# Patient Record
Sex: Male | Born: 1945
Health system: Southern US, Community
[De-identification: ages and names within clinical notes are randomized; demographics above are authoritative.]

## PROBLEM LIST (undated history)

## (undated) DIAGNOSIS — E6609 Other obesity due to excess calories: Secondary | ICD-10-CM

## (undated) DIAGNOSIS — I2510 Atherosclerotic heart disease of native coronary artery without angina pectoris: Secondary | ICD-10-CM

## (undated) DIAGNOSIS — I4891 Unspecified atrial fibrillation: Secondary | ICD-10-CM

## (undated) DIAGNOSIS — E66811 Obesity, class 1: Secondary | ICD-10-CM

## (undated) DIAGNOSIS — I10 Essential (primary) hypertension: Secondary | ICD-10-CM

## (undated) DIAGNOSIS — I639 Cerebral infarction, unspecified: Secondary | ICD-10-CM

## (undated) DIAGNOSIS — I219 Acute myocardial infarction, unspecified: Secondary | ICD-10-CM

## (undated) DIAGNOSIS — E785 Hyperlipidemia, unspecified: Secondary | ICD-10-CM

## (undated) DIAGNOSIS — R7301 Impaired fasting glucose: Secondary | ICD-10-CM

## (undated) DIAGNOSIS — I480 Paroxysmal atrial fibrillation: Secondary | ICD-10-CM

## (undated) DIAGNOSIS — C801 Malignant (primary) neoplasm, unspecified: Secondary | ICD-10-CM

## (undated) DIAGNOSIS — E559 Vitamin D deficiency, unspecified: Secondary | ICD-10-CM

## (undated) DIAGNOSIS — F5101 Primary insomnia: Secondary | ICD-10-CM

## (undated) DIAGNOSIS — I110 Hypertensive heart disease with heart failure: Secondary | ICD-10-CM

## (undated) DIAGNOSIS — I6529 Occlusion and stenosis of unspecified carotid artery: Secondary | ICD-10-CM

## (undated) DIAGNOSIS — Z6836 Body mass index (BMI) 36.0-36.9, adult: Secondary | ICD-10-CM

## (undated) DIAGNOSIS — E039 Hypothyroidism, unspecified: Secondary | ICD-10-CM

## (undated) DIAGNOSIS — J449 Chronic obstructive pulmonary disease, unspecified: Secondary | ICD-10-CM

## (undated) DIAGNOSIS — I5032 Chronic diastolic (congestive) heart failure: Secondary | ICD-10-CM

## (undated) HISTORY — DX: Chronic obstructive pulmonary disease, unspecified: J44.9

## (undated) HISTORY — DX: Essential (primary) hypertension: I10

## (undated) HISTORY — DX: Primary insomnia: F51.01

## (undated) HISTORY — DX: Vitamin D deficiency, unspecified: E55.9

## (undated) HISTORY — DX: Impaired fasting glucose: R73.01

## (undated) HISTORY — DX: Morbid (severe) obesity due to excess calories: Z68.36

## (undated) HISTORY — DX: Atherosclerotic heart disease of native coronary artery without angina pectoris: I25.10

## (undated) HISTORY — PX: ROTATOR CUFF REPAIR: SHX139

## (undated) HISTORY — DX: Occlusion and stenosis of unspecified carotid artery: I65.29

## (undated) HISTORY — PX: HERNIA REPAIR: SHX51

## (undated) HISTORY — DX: Hypertensive heart disease with heart failure: I11.0

## (undated) HISTORY — DX: Morbid (severe) obesity due to excess calories: E66.01

## (undated) HISTORY — PX: CARDIAC CATHETERIZATION: SHX172

## (undated) HISTORY — DX: Paroxysmal atrial fibrillation: I48.0

## (undated) HISTORY — DX: Acute myocardial infarction, unspecified: I21.9

## (undated) HISTORY — DX: Cerebral infarction, unspecified: I63.9

## (undated) HISTORY — DX: Chronic diastolic (congestive) heart failure: I50.32

## (undated) HISTORY — DX: Hyperlipidemia, unspecified: E78.5

---
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
## 1973-08-29 HISTORY — PX: TONSILLECTOMY: SUR1361

## 1999-08-30 HISTORY — PX: CORONARY ARTERY BYPASS GRAFT: SHX141

## 2000-08-29 HISTORY — PX: CARDIOVERSION: SHX1299

## 2004-08-29 HISTORY — PX: CHOLECYSTECTOMY: SHX55

## 2013-04-19 DIAGNOSIS — J069 Acute upper respiratory infection, unspecified: Secondary | ICD-10-CM | POA: Diagnosis not present

## 2013-04-19 DIAGNOSIS — J209 Acute bronchitis, unspecified: Secondary | ICD-10-CM | POA: Diagnosis not present

## 2013-04-23 DIAGNOSIS — R05 Cough: Secondary | ICD-10-CM | POA: Diagnosis not present

## 2013-04-23 DIAGNOSIS — R059 Cough, unspecified: Secondary | ICD-10-CM | POA: Diagnosis not present

## 2013-04-23 DIAGNOSIS — J209 Acute bronchitis, unspecified: Secondary | ICD-10-CM | POA: Diagnosis not present

## 2013-05-06 DIAGNOSIS — I2510 Atherosclerotic heart disease of native coronary artery without angina pectoris: Secondary | ICD-10-CM | POA: Diagnosis not present

## 2013-05-06 DIAGNOSIS — I10 Essential (primary) hypertension: Secondary | ICD-10-CM | POA: Diagnosis not present

## 2013-05-09 DIAGNOSIS — R233 Spontaneous ecchymoses: Secondary | ICD-10-CM | POA: Diagnosis not present

## 2013-05-09 DIAGNOSIS — D485 Neoplasm of uncertain behavior of skin: Secondary | ICD-10-CM | POA: Diagnosis not present

## 2013-05-09 DIAGNOSIS — I2510 Atherosclerotic heart disease of native coronary artery without angina pectoris: Secondary | ICD-10-CM | POA: Diagnosis not present

## 2013-05-09 DIAGNOSIS — I10 Essential (primary) hypertension: Secondary | ICD-10-CM | POA: Diagnosis not present

## 2013-05-09 DIAGNOSIS — J410 Simple chronic bronchitis: Secondary | ICD-10-CM | POA: Diagnosis not present

## 2013-05-09 DIAGNOSIS — C4492 Squamous cell carcinoma of skin, unspecified: Secondary | ICD-10-CM | POA: Diagnosis not present

## 2013-05-09 DIAGNOSIS — E7800 Pure hypercholesterolemia, unspecified: Secondary | ICD-10-CM | POA: Diagnosis not present

## 2013-05-23 DIAGNOSIS — D2370 Other benign neoplasm of skin of unspecified lower limb, including hip: Secondary | ICD-10-CM | POA: Diagnosis not present

## 2013-05-23 DIAGNOSIS — C44791 Other specified malignant neoplasm of skin of unspecified lower limb, including hip: Secondary | ICD-10-CM | POA: Diagnosis not present

## 2013-05-23 DIAGNOSIS — D485 Neoplasm of uncertain behavior of skin: Secondary | ICD-10-CM | POA: Diagnosis not present

## 2013-06-06 DIAGNOSIS — D0460 Carcinoma in situ of skin of unspecified upper limb, including shoulder: Secondary | ICD-10-CM | POA: Diagnosis not present

## 2013-06-06 DIAGNOSIS — D485 Neoplasm of uncertain behavior of skin: Secondary | ICD-10-CM | POA: Diagnosis not present

## 2013-06-06 DIAGNOSIS — D2370 Other benign neoplasm of skin of unspecified lower limb, including hip: Secondary | ICD-10-CM | POA: Diagnosis not present

## 2013-06-06 DIAGNOSIS — C44621 Squamous cell carcinoma of skin of unspecified upper limb, including shoulder: Secondary | ICD-10-CM | POA: Diagnosis not present

## 2013-06-08 DIAGNOSIS — Z79899 Other long term (current) drug therapy: Secondary | ICD-10-CM | POA: Diagnosis not present

## 2013-06-08 DIAGNOSIS — E7800 Pure hypercholesterolemia, unspecified: Secondary | ICD-10-CM | POA: Diagnosis not present

## 2013-06-08 DIAGNOSIS — R5381 Other malaise: Secondary | ICD-10-CM | POA: Diagnosis not present

## 2013-06-08 DIAGNOSIS — J189 Pneumonia, unspecified organism: Secondary | ICD-10-CM | POA: Diagnosis not present

## 2013-06-08 DIAGNOSIS — I4891 Unspecified atrial fibrillation: Secondary | ICD-10-CM | POA: Diagnosis not present

## 2013-06-08 DIAGNOSIS — R0989 Other specified symptoms and signs involving the circulatory and respiratory systems: Secondary | ICD-10-CM | POA: Diagnosis not present

## 2013-06-08 DIAGNOSIS — I10 Essential (primary) hypertension: Secondary | ICD-10-CM | POA: Diagnosis not present

## 2013-06-09 DIAGNOSIS — R0989 Other specified symptoms and signs involving the circulatory and respiratory systems: Secondary | ICD-10-CM | POA: Diagnosis not present

## 2013-06-12 DIAGNOSIS — J189 Pneumonia, unspecified organism: Secondary | ICD-10-CM | POA: Diagnosis not present

## 2013-06-12 DIAGNOSIS — I4891 Unspecified atrial fibrillation: Secondary | ICD-10-CM | POA: Diagnosis not present

## 2013-06-12 DIAGNOSIS — R0602 Shortness of breath: Secondary | ICD-10-CM | POA: Diagnosis not present

## 2013-06-12 DIAGNOSIS — I4892 Unspecified atrial flutter: Secondary | ICD-10-CM | POA: Diagnosis not present

## 2013-06-12 DIAGNOSIS — R0609 Other forms of dyspnea: Secondary | ICD-10-CM | POA: Diagnosis not present

## 2013-06-12 DIAGNOSIS — R05 Cough: Secondary | ICD-10-CM | POA: Diagnosis not present

## 2013-06-12 DIAGNOSIS — J9801 Acute bronchospasm: Secondary | ICD-10-CM | POA: Diagnosis not present

## 2013-06-13 DIAGNOSIS — J209 Acute bronchitis, unspecified: Secondary | ICD-10-CM | POA: Diagnosis not present

## 2013-06-13 DIAGNOSIS — C44621 Squamous cell carcinoma of skin of unspecified upper limb, including shoulder: Secondary | ICD-10-CM | POA: Diagnosis not present

## 2013-06-13 DIAGNOSIS — I4892 Unspecified atrial flutter: Secondary | ICD-10-CM | POA: Diagnosis not present

## 2013-06-18 DIAGNOSIS — I10 Essential (primary) hypertension: Secondary | ICD-10-CM | POA: Diagnosis not present

## 2013-06-18 DIAGNOSIS — E785 Hyperlipidemia, unspecified: Secondary | ICD-10-CM | POA: Diagnosis not present

## 2013-06-18 DIAGNOSIS — Z8249 Family history of ischemic heart disease and other diseases of the circulatory system: Secondary | ICD-10-CM | POA: Diagnosis not present

## 2013-06-18 DIAGNOSIS — Z803 Family history of malignant neoplasm of breast: Secondary | ICD-10-CM | POA: Diagnosis not present

## 2013-06-18 DIAGNOSIS — Z87891 Personal history of nicotine dependence: Secondary | ICD-10-CM | POA: Diagnosis not present

## 2013-06-18 DIAGNOSIS — I4891 Unspecified atrial fibrillation: Secondary | ICD-10-CM | POA: Diagnosis not present

## 2013-06-24 DIAGNOSIS — I4891 Unspecified atrial fibrillation: Secondary | ICD-10-CM | POA: Diagnosis not present

## 2013-07-02 DIAGNOSIS — I2510 Atherosclerotic heart disease of native coronary artery without angina pectoris: Secondary | ICD-10-CM | POA: Diagnosis not present

## 2013-07-02 DIAGNOSIS — I4891 Unspecified atrial fibrillation: Secondary | ICD-10-CM | POA: Diagnosis not present

## 2013-07-09 DIAGNOSIS — I10 Essential (primary) hypertension: Secondary | ICD-10-CM | POA: Diagnosis not present

## 2013-07-09 DIAGNOSIS — I4891 Unspecified atrial fibrillation: Secondary | ICD-10-CM | POA: Diagnosis not present

## 2013-07-09 DIAGNOSIS — R0609 Other forms of dyspnea: Secondary | ICD-10-CM | POA: Diagnosis not present

## 2013-07-09 DIAGNOSIS — E785 Hyperlipidemia, unspecified: Secondary | ICD-10-CM | POA: Diagnosis not present

## 2013-07-16 DIAGNOSIS — J0100 Acute maxillary sinusitis, unspecified: Secondary | ICD-10-CM | POA: Diagnosis not present

## 2013-07-18 DIAGNOSIS — J209 Acute bronchitis, unspecified: Secondary | ICD-10-CM | POA: Diagnosis not present

## 2013-07-18 DIAGNOSIS — J0100 Acute maxillary sinusitis, unspecified: Secondary | ICD-10-CM | POA: Diagnosis not present

## 2013-08-07 DIAGNOSIS — I10 Essential (primary) hypertension: Secondary | ICD-10-CM | POA: Diagnosis not present

## 2013-08-07 DIAGNOSIS — R0609 Other forms of dyspnea: Secondary | ICD-10-CM | POA: Diagnosis not present

## 2013-08-07 DIAGNOSIS — I4891 Unspecified atrial fibrillation: Secondary | ICD-10-CM | POA: Diagnosis not present

## 2013-08-07 DIAGNOSIS — E785 Hyperlipidemia, unspecified: Secondary | ICD-10-CM | POA: Diagnosis not present

## 2013-08-12 DIAGNOSIS — J0100 Acute maxillary sinusitis, unspecified: Secondary | ICD-10-CM | POA: Diagnosis not present

## 2013-09-08 DIAGNOSIS — G4733 Obstructive sleep apnea (adult) (pediatric): Secondary | ICD-10-CM | POA: Diagnosis not present

## 2013-09-08 DIAGNOSIS — R0989 Other specified symptoms and signs involving the circulatory and respiratory systems: Secondary | ICD-10-CM | POA: Diagnosis not present

## 2013-09-08 DIAGNOSIS — I4891 Unspecified atrial fibrillation: Secondary | ICD-10-CM | POA: Diagnosis not present

## 2013-09-25 DIAGNOSIS — I4891 Unspecified atrial fibrillation: Secondary | ICD-10-CM | POA: Diagnosis not present

## 2013-09-25 DIAGNOSIS — I2510 Atherosclerotic heart disease of native coronary artery without angina pectoris: Secondary | ICD-10-CM | POA: Diagnosis not present

## 2013-09-25 DIAGNOSIS — I10 Essential (primary) hypertension: Secondary | ICD-10-CM | POA: Diagnosis not present

## 2013-09-25 DIAGNOSIS — E7800 Pure hypercholesterolemia, unspecified: Secondary | ICD-10-CM | POA: Diagnosis not present

## 2013-10-02 DIAGNOSIS — R0989 Other specified symptoms and signs involving the circulatory and respiratory systems: Secondary | ICD-10-CM | POA: Diagnosis not present

## 2013-10-02 DIAGNOSIS — E785 Hyperlipidemia, unspecified: Secondary | ICD-10-CM | POA: Diagnosis not present

## 2013-10-02 DIAGNOSIS — I10 Essential (primary) hypertension: Secondary | ICD-10-CM | POA: Diagnosis not present

## 2013-10-02 DIAGNOSIS — R0609 Other forms of dyspnea: Secondary | ICD-10-CM | POA: Diagnosis not present

## 2013-10-02 DIAGNOSIS — I4891 Unspecified atrial fibrillation: Secondary | ICD-10-CM | POA: Diagnosis not present

## 2013-10-04 DIAGNOSIS — J0100 Acute maxillary sinusitis, unspecified: Secondary | ICD-10-CM | POA: Diagnosis not present

## 2013-10-18 DIAGNOSIS — I10 Essential (primary) hypertension: Secondary | ICD-10-CM | POA: Diagnosis not present

## 2013-10-18 DIAGNOSIS — K089 Disorder of teeth and supporting structures, unspecified: Secondary | ICD-10-CM | POA: Diagnosis not present

## 2013-10-19 DIAGNOSIS — L570 Actinic keratosis: Secondary | ICD-10-CM | POA: Diagnosis not present

## 2013-11-08 DIAGNOSIS — J0100 Acute maxillary sinusitis, unspecified: Secondary | ICD-10-CM | POA: Diagnosis not present

## 2013-12-30 DIAGNOSIS — I2510 Atherosclerotic heart disease of native coronary artery without angina pectoris: Secondary | ICD-10-CM | POA: Diagnosis not present

## 2013-12-30 DIAGNOSIS — I4891 Unspecified atrial fibrillation: Secondary | ICD-10-CM | POA: Diagnosis not present

## 2013-12-30 DIAGNOSIS — E663 Overweight: Secondary | ICD-10-CM | POA: Diagnosis not present

## 2013-12-30 DIAGNOSIS — I10 Essential (primary) hypertension: Secondary | ICD-10-CM | POA: Diagnosis not present

## 2013-12-30 DIAGNOSIS — E7800 Pure hypercholesterolemia, unspecified: Secondary | ICD-10-CM | POA: Diagnosis not present

## 2013-12-30 DIAGNOSIS — Z6841 Body Mass Index (BMI) 40.0 and over, adult: Secondary | ICD-10-CM | POA: Diagnosis not present

## 2014-01-08 DIAGNOSIS — L570 Actinic keratosis: Secondary | ICD-10-CM | POA: Diagnosis not present

## 2014-01-12 DIAGNOSIS — E7800 Pure hypercholesterolemia, unspecified: Secondary | ICD-10-CM | POA: Diagnosis not present

## 2014-01-12 DIAGNOSIS — Z79899 Other long term (current) drug therapy: Secondary | ICD-10-CM | POA: Diagnosis not present

## 2014-01-12 DIAGNOSIS — R05 Cough: Secondary | ICD-10-CM | POA: Diagnosis not present

## 2014-01-12 DIAGNOSIS — J988 Other specified respiratory disorders: Secondary | ICD-10-CM | POA: Diagnosis not present

## 2014-01-12 DIAGNOSIS — I4891 Unspecified atrial fibrillation: Secondary | ICD-10-CM | POA: Diagnosis not present

## 2014-01-12 DIAGNOSIS — J4489 Other specified chronic obstructive pulmonary disease: Secondary | ICD-10-CM | POA: Diagnosis not present

## 2014-01-12 DIAGNOSIS — R0602 Shortness of breath: Secondary | ICD-10-CM | POA: Diagnosis not present

## 2014-01-12 DIAGNOSIS — J449 Chronic obstructive pulmonary disease, unspecified: Secondary | ICD-10-CM | POA: Diagnosis not present

## 2014-01-12 DIAGNOSIS — I10 Essential (primary) hypertension: Secondary | ICD-10-CM | POA: Diagnosis not present

## 2014-01-12 DIAGNOSIS — R509 Fever, unspecified: Secondary | ICD-10-CM | POA: Diagnosis not present

## 2014-01-12 DIAGNOSIS — R059 Cough, unspecified: Secondary | ICD-10-CM | POA: Diagnosis not present

## 2014-01-24 DIAGNOSIS — J209 Acute bronchitis, unspecified: Secondary | ICD-10-CM | POA: Diagnosis not present

## 2014-03-10 DIAGNOSIS — E785 Hyperlipidemia, unspecified: Secondary | ICD-10-CM | POA: Diagnosis not present

## 2014-03-10 DIAGNOSIS — I10 Essential (primary) hypertension: Secondary | ICD-10-CM | POA: Diagnosis not present

## 2014-03-10 DIAGNOSIS — R0609 Other forms of dyspnea: Secondary | ICD-10-CM | POA: Diagnosis not present

## 2014-03-10 DIAGNOSIS — I4891 Unspecified atrial fibrillation: Secondary | ICD-10-CM | POA: Diagnosis not present

## 2014-03-17 DIAGNOSIS — E785 Hyperlipidemia, unspecified: Secondary | ICD-10-CM | POA: Diagnosis not present

## 2014-03-17 DIAGNOSIS — R0609 Other forms of dyspnea: Secondary | ICD-10-CM | POA: Diagnosis not present

## 2014-03-21 DIAGNOSIS — J0100 Acute maxillary sinusitis, unspecified: Secondary | ICD-10-CM | POA: Diagnosis not present

## 2014-04-30 DIAGNOSIS — S61409A Unspecified open wound of unspecified hand, initial encounter: Secondary | ICD-10-CM | POA: Diagnosis not present

## 2014-04-30 DIAGNOSIS — Z6841 Body Mass Index (BMI) 40.0 and over, adult: Secondary | ICD-10-CM | POA: Diagnosis not present

## 2014-04-30 DIAGNOSIS — Z79899 Other long term (current) drug therapy: Secondary | ICD-10-CM | POA: Diagnosis not present

## 2014-04-30 DIAGNOSIS — E7800 Pure hypercholesterolemia, unspecified: Secondary | ICD-10-CM | POA: Diagnosis not present

## 2014-04-30 DIAGNOSIS — E663 Overweight: Secondary | ICD-10-CM | POA: Diagnosis not present

## 2014-04-30 DIAGNOSIS — I10 Essential (primary) hypertension: Secondary | ICD-10-CM | POA: Diagnosis not present

## 2014-04-30 DIAGNOSIS — Z23 Encounter for immunization: Secondary | ICD-10-CM | POA: Diagnosis not present

## 2014-04-30 DIAGNOSIS — I2510 Atherosclerotic heart disease of native coronary artery without angina pectoris: Secondary | ICD-10-CM | POA: Diagnosis not present

## 2014-06-11 DIAGNOSIS — H6983 Other specified disorders of Eustachian tube, bilateral: Secondary | ICD-10-CM | POA: Diagnosis not present

## 2014-06-23 DIAGNOSIS — J209 Acute bronchitis, unspecified: Secondary | ICD-10-CM | POA: Diagnosis not present

## 2014-06-23 DIAGNOSIS — R05 Cough: Secondary | ICD-10-CM | POA: Diagnosis not present

## 2014-06-23 DIAGNOSIS — J158 Pneumonia due to other specified bacteria: Secondary | ICD-10-CM | POA: Diagnosis not present

## 2014-07-16 DIAGNOSIS — R0600 Dyspnea, unspecified: Secondary | ICD-10-CM | POA: Diagnosis not present

## 2014-07-16 DIAGNOSIS — J441 Chronic obstructive pulmonary disease with (acute) exacerbation: Secondary | ICD-10-CM | POA: Diagnosis not present

## 2014-07-18 DIAGNOSIS — H1033 Unspecified acute conjunctivitis, bilateral: Secondary | ICD-10-CM | POA: Diagnosis not present

## 2014-07-28 DIAGNOSIS — Z23 Encounter for immunization: Secondary | ICD-10-CM | POA: Diagnosis not present

## 2014-08-06 DIAGNOSIS — E780 Pure hypercholesterolemia: Secondary | ICD-10-CM | POA: Diagnosis not present

## 2014-08-06 DIAGNOSIS — I119 Hypertensive heart disease without heart failure: Secondary | ICD-10-CM | POA: Diagnosis not present

## 2014-08-06 DIAGNOSIS — I2510 Atherosclerotic heart disease of native coronary artery without angina pectoris: Secondary | ICD-10-CM | POA: Diagnosis not present

## 2014-08-06 DIAGNOSIS — I480 Paroxysmal atrial fibrillation: Secondary | ICD-10-CM | POA: Diagnosis not present

## 2014-08-06 DIAGNOSIS — I10 Essential (primary) hypertension: Secondary | ICD-10-CM | POA: Diagnosis not present

## 2014-08-28 DIAGNOSIS — J209 Acute bronchitis, unspecified: Secondary | ICD-10-CM | POA: Diagnosis not present

## 2014-09-24 DIAGNOSIS — J019 Acute sinusitis, unspecified: Secondary | ICD-10-CM | POA: Diagnosis not present

## 2014-09-25 DIAGNOSIS — I480 Paroxysmal atrial fibrillation: Secondary | ICD-10-CM | POA: Diagnosis not present

## 2014-09-25 DIAGNOSIS — E785 Hyperlipidemia, unspecified: Secondary | ICD-10-CM | POA: Diagnosis not present

## 2014-09-25 DIAGNOSIS — R0683 Snoring: Secondary | ICD-10-CM | POA: Diagnosis not present

## 2014-09-25 DIAGNOSIS — Z951 Presence of aortocoronary bypass graft: Secondary | ICD-10-CM | POA: Diagnosis not present

## 2014-09-25 DIAGNOSIS — I10 Essential (primary) hypertension: Secondary | ICD-10-CM | POA: Diagnosis not present

## 2014-10-06 DIAGNOSIS — I4891 Unspecified atrial fibrillation: Secondary | ICD-10-CM | POA: Diagnosis not present

## 2014-10-06 DIAGNOSIS — I369 Nonrheumatic tricuspid valve disorder, unspecified: Secondary | ICD-10-CM | POA: Diagnosis not present

## 2014-10-12 DIAGNOSIS — J0100 Acute maxillary sinusitis, unspecified: Secondary | ICD-10-CM | POA: Diagnosis not present

## 2014-10-17 DIAGNOSIS — M545 Low back pain: Secondary | ICD-10-CM | POA: Diagnosis not present

## 2014-10-24 DIAGNOSIS — M545 Low back pain: Secondary | ICD-10-CM | POA: Diagnosis not present

## 2014-10-24 DIAGNOSIS — J069 Acute upper respiratory infection, unspecified: Secondary | ICD-10-CM | POA: Diagnosis not present

## 2014-11-07 DIAGNOSIS — Z87891 Personal history of nicotine dependence: Secondary | ICD-10-CM | POA: Diagnosis not present

## 2014-11-07 DIAGNOSIS — I498 Other specified cardiac arrhythmias: Secondary | ICD-10-CM | POA: Diagnosis not present

## 2014-11-07 DIAGNOSIS — I4892 Unspecified atrial flutter: Secondary | ICD-10-CM | POA: Diagnosis not present

## 2014-11-07 DIAGNOSIS — E669 Obesity, unspecified: Secondary | ICD-10-CM | POA: Diagnosis not present

## 2014-11-07 DIAGNOSIS — I491 Atrial premature depolarization: Secondary | ICD-10-CM | POA: Diagnosis not present

## 2014-11-07 DIAGNOSIS — I4891 Unspecified atrial fibrillation: Secondary | ICD-10-CM | POA: Diagnosis not present

## 2014-11-07 DIAGNOSIS — Z951 Presence of aortocoronary bypass graft: Secondary | ICD-10-CM | POA: Diagnosis not present

## 2014-11-07 DIAGNOSIS — I119 Hypertensive heart disease without heart failure: Secondary | ICD-10-CM | POA: Diagnosis not present

## 2014-11-07 DIAGNOSIS — R079 Chest pain, unspecified: Secondary | ICD-10-CM | POA: Diagnosis not present

## 2014-11-07 DIAGNOSIS — E780 Pure hypercholesterolemia: Secondary | ICD-10-CM | POA: Diagnosis not present

## 2014-11-07 DIAGNOSIS — I25810 Atherosclerosis of coronary artery bypass graft(s) without angina pectoris: Secondary | ICD-10-CM | POA: Diagnosis present

## 2014-11-07 DIAGNOSIS — I480 Paroxysmal atrial fibrillation: Secondary | ICD-10-CM | POA: Diagnosis not present

## 2014-11-07 DIAGNOSIS — I483 Typical atrial flutter: Secondary | ICD-10-CM | POA: Diagnosis not present

## 2014-11-07 DIAGNOSIS — I10 Essential (primary) hypertension: Secondary | ICD-10-CM | POA: Diagnosis present

## 2014-11-07 DIAGNOSIS — R0683 Snoring: Secondary | ICD-10-CM | POA: Diagnosis present

## 2014-11-07 DIAGNOSIS — Z79899 Other long term (current) drug therapy: Secondary | ICD-10-CM | POA: Diagnosis not present

## 2014-11-07 DIAGNOSIS — I4430 Unspecified atrioventricular block: Secondary | ICD-10-CM | POA: Diagnosis not present

## 2014-11-07 DIAGNOSIS — I2510 Atherosclerotic heart disease of native coronary artery without angina pectoris: Secondary | ICD-10-CM | POA: Diagnosis not present

## 2014-11-07 DIAGNOSIS — E785 Hyperlipidemia, unspecified: Secondary | ICD-10-CM | POA: Diagnosis present

## 2014-11-07 DIAGNOSIS — I5031 Acute diastolic (congestive) heart failure: Secondary | ICD-10-CM | POA: Diagnosis present

## 2014-11-07 DIAGNOSIS — I252 Old myocardial infarction: Secondary | ICD-10-CM | POA: Diagnosis not present

## 2014-11-07 DIAGNOSIS — J410 Simple chronic bronchitis: Secondary | ICD-10-CM | POA: Diagnosis not present

## 2014-12-01 DIAGNOSIS — I481 Persistent atrial fibrillation: Secondary | ICD-10-CM | POA: Diagnosis not present

## 2014-12-01 DIAGNOSIS — J449 Chronic obstructive pulmonary disease, unspecified: Secondary | ICD-10-CM | POA: Diagnosis not present

## 2014-12-01 DIAGNOSIS — I10 Essential (primary) hypertension: Secondary | ICD-10-CM | POA: Diagnosis not present

## 2014-12-01 DIAGNOSIS — I119 Hypertensive heart disease without heart failure: Secondary | ICD-10-CM | POA: Diagnosis not present

## 2014-12-04 DIAGNOSIS — I4891 Unspecified atrial fibrillation: Secondary | ICD-10-CM | POA: Diagnosis not present

## 2014-12-04 DIAGNOSIS — R0789 Other chest pain: Secondary | ICD-10-CM | POA: Diagnosis not present

## 2014-12-04 DIAGNOSIS — J9811 Atelectasis: Secondary | ICD-10-CM | POA: Diagnosis not present

## 2014-12-04 DIAGNOSIS — I480 Paroxysmal atrial fibrillation: Secondary | ICD-10-CM | POA: Diagnosis not present

## 2014-12-05 DIAGNOSIS — I483 Typical atrial flutter: Secondary | ICD-10-CM | POA: Diagnosis not present

## 2014-12-05 DIAGNOSIS — E669 Obesity, unspecified: Secondary | ICD-10-CM | POA: Diagnosis present

## 2014-12-05 DIAGNOSIS — Z951 Presence of aortocoronary bypass graft: Secondary | ICD-10-CM | POA: Diagnosis not present

## 2014-12-05 DIAGNOSIS — I4892 Unspecified atrial flutter: Secondary | ICD-10-CM | POA: Diagnosis present

## 2014-12-05 DIAGNOSIS — Z888 Allergy status to other drugs, medicaments and biological substances status: Secondary | ICD-10-CM | POA: Diagnosis not present

## 2014-12-05 DIAGNOSIS — I10 Essential (primary) hypertension: Secondary | ICD-10-CM | POA: Diagnosis not present

## 2014-12-05 DIAGNOSIS — Z87891 Personal history of nicotine dependence: Secondary | ICD-10-CM | POA: Diagnosis not present

## 2014-12-05 DIAGNOSIS — R7989 Other specified abnormal findings of blood chemistry: Secondary | ICD-10-CM | POA: Diagnosis not present

## 2014-12-05 DIAGNOSIS — Z6841 Body Mass Index (BMI) 40.0 and over, adult: Secondary | ICD-10-CM | POA: Diagnosis not present

## 2014-12-05 DIAGNOSIS — R079 Chest pain, unspecified: Secondary | ICD-10-CM | POA: Diagnosis not present

## 2014-12-05 DIAGNOSIS — I4891 Unspecified atrial fibrillation: Secondary | ICD-10-CM | POA: Diagnosis present

## 2014-12-05 DIAGNOSIS — E785 Hyperlipidemia, unspecified: Secondary | ICD-10-CM | POA: Diagnosis not present

## 2014-12-05 DIAGNOSIS — I480 Paroxysmal atrial fibrillation: Secondary | ICD-10-CM | POA: Diagnosis not present

## 2014-12-05 DIAGNOSIS — I249 Acute ischemic heart disease, unspecified: Secondary | ICD-10-CM | POA: Diagnosis not present

## 2014-12-05 DIAGNOSIS — Z79899 Other long term (current) drug therapy: Secondary | ICD-10-CM | POA: Diagnosis not present

## 2014-12-05 DIAGNOSIS — I252 Old myocardial infarction: Secondary | ICD-10-CM | POA: Diagnosis not present

## 2014-12-05 DIAGNOSIS — I25810 Atherosclerosis of coronary artery bypass graft(s) without angina pectoris: Secondary | ICD-10-CM | POA: Diagnosis present

## 2014-12-05 DIAGNOSIS — R000 Tachycardia, unspecified: Secondary | ICD-10-CM | POA: Diagnosis not present

## 2014-12-05 DIAGNOSIS — J449 Chronic obstructive pulmonary disease, unspecified: Secondary | ICD-10-CM | POA: Diagnosis present

## 2014-12-05 DIAGNOSIS — R0789 Other chest pain: Secondary | ICD-10-CM | POA: Diagnosis not present

## 2014-12-05 DIAGNOSIS — I5030 Unspecified diastolic (congestive) heart failure: Secondary | ICD-10-CM | POA: Diagnosis not present

## 2014-12-05 DIAGNOSIS — R05 Cough: Secondary | ICD-10-CM | POA: Diagnosis not present

## 2014-12-05 DIAGNOSIS — E876 Hypokalemia: Secondary | ICD-10-CM | POA: Diagnosis not present

## 2014-12-05 DIAGNOSIS — J9811 Atelectasis: Secondary | ICD-10-CM | POA: Diagnosis not present

## 2014-12-11 DIAGNOSIS — J449 Chronic obstructive pulmonary disease, unspecified: Secondary | ICD-10-CM | POA: Diagnosis not present

## 2014-12-11 DIAGNOSIS — I10 Essential (primary) hypertension: Secondary | ICD-10-CM | POA: Diagnosis not present

## 2014-12-11 DIAGNOSIS — I480 Paroxysmal atrial fibrillation: Secondary | ICD-10-CM | POA: Diagnosis not present

## 2014-12-11 DIAGNOSIS — I5032 Chronic diastolic (congestive) heart failure: Secondary | ICD-10-CM | POA: Diagnosis not present

## 2014-12-11 DIAGNOSIS — R5383 Other fatigue: Secondary | ICD-10-CM | POA: Diagnosis not present

## 2014-12-19 DIAGNOSIS — E782 Mixed hyperlipidemia: Secondary | ICD-10-CM | POA: Diagnosis not present

## 2014-12-19 DIAGNOSIS — I10 Essential (primary) hypertension: Secondary | ICD-10-CM | POA: Diagnosis not present

## 2014-12-19 DIAGNOSIS — I480 Paroxysmal atrial fibrillation: Secondary | ICD-10-CM | POA: Diagnosis not present

## 2014-12-19 DIAGNOSIS — J449 Chronic obstructive pulmonary disease, unspecified: Secondary | ICD-10-CM | POA: Diagnosis not present

## 2014-12-19 DIAGNOSIS — I110 Hypertensive heart disease with heart failure: Secondary | ICD-10-CM | POA: Diagnosis not present

## 2014-12-19 DIAGNOSIS — R600 Localized edema: Secondary | ICD-10-CM | POA: Diagnosis not present

## 2014-12-23 DIAGNOSIS — I483 Typical atrial flutter: Secondary | ICD-10-CM | POA: Diagnosis not present

## 2014-12-23 DIAGNOSIS — I5032 Chronic diastolic (congestive) heart failure: Secondary | ICD-10-CM | POA: Diagnosis not present

## 2014-12-31 ENCOUNTER — Ambulatory Visit (INDEPENDENT_AMBULATORY_CARE_PROVIDER_SITE_OTHER): Payer: Medicare Other | Admitting: Internal Medicine

## 2014-12-31 ENCOUNTER — Encounter: Payer: Self-pay | Admitting: Internal Medicine

## 2014-12-31 DIAGNOSIS — R0600 Dyspnea, unspecified: Secondary | ICD-10-CM

## 2014-12-31 HISTORY — DX: Dyspnea, unspecified: R06.00

## 2014-12-31 NOTE — Progress Notes (Signed)
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 Pulmonary Consult    Referred by Dr. Tobie Poet for eval of ? COPD. Pt c/o cough that comes and goes. He states gets bronchitis every 4-6 months.  Cough is non prod and not bothering him a this time.   15 year pattern =  recurrent dry cough/insidious onset typically/ then has to use neb  up to 4 x daily x 2 weeks  With abx/ kenalog then resolves completely then recurs every 6 months ? More freq per wife (pt denies) and maybe more severe x one year and gets fine between with freq abx / kenalog shots / seems to be getting more frequent / no relation between fluid and how he feels breathing or coughing.  Doe x mailbox and back with hill to house from mb and outbuilding, but does not have to stop when gets back to house Did well with rehab starting day of ov s 02   No obvious  patterns in day to day or daytime variabilty or assoc  cp or chest tightness, subjective wheeze overt sinus or hb symptoms. No unusual exp hx or h/o childhood pna/ asthma or knowledge of premature birth.  Sleeping ok without nocturnal  or early am exacerbation  of respiratory  c/o's or need for noct saba. Also denies any obvious fluctuation of symptoms with weather or environmental changes or other aggravating or alleviating factors except as outlined above   Current Medications, Allergies, Complete Past Medical History, Past Surgical History, Family History, and Social History were reviewed in [employer] record.            Review of Systems  Constitutional: Negative for  fever, chills, activity change, appetite change and unexpected weight change.  HENT: Negative for congestion, dental problem, postnasal drip, rhinorrhea, sneezing, sore throat, trouble swallowing and voice change.   Eyes: Negative for visual disturbance.  Respiratory: Positive for cough. Negative for choking and shortness of breath.   Cardiovascular: Positive for leg swelling. Negative for chest pain.  Gastrointestinal: Negative for nausea, vomiting and abdominal pain.  Genitourinary: Negative for difficulty urinating.  Musculoskeletal: Negative for arthralgias.  Skin: Negative for rash.  Psychiatric/Behavioral: Negative for behavioral problems and confusion.       Objective:   Physical Exam  amb hoarse wm nad  Wt Readings from Last 3 Encounters:  12/31/14 283 lb (128.368 kg)    Vital signs reviewed   HEENT: nl dentition, turbinates, and orophanx. Nl external ear canals without cough reflex   NECK :  without JVD/Nodes/TM/ nl carotid upstrokes bilaterally   LUNGS: no acc muscle use, clear to A and P bilaterally without cough on insp or exp maneuvers   CV:  RRR  no s3 or murmur or increase in P2,  1+ pitting edema   ABD:  soft and nontender with nl excursion in the supine position. No bruits or organomegaly, bowel sounds nl  MS:  warm without deformities, calf tenderness, cyanosis or clubbing  SKIN: warm and dry without lesions  NEURO:  alert, approp, no deficits       cxr 12/25/14 No acute changes/ mild CM per report       Assessment & Plan:

## 2014-12-31 NOTE — Assessment & Plan Note (Signed)
-   amiodarone rx 11/2014 >>>   When respiratory symptoms begin or become refractory well after a patient reports complete smoking cessation,  Especially when this wasn't the case while they were smoking, a red flag is raised based on the work of Dr Kris Mouton which states:  if you quit smoking when your best day FEV1 is still well preserved it is highly unlikely you will progress to severe disease.  That is to say, once the smoking stops,  the symptoms should not suddenly erupt or markedly worsen.  If so, the differential diagnosis should include  obesity/deconditioning,  LPR/Reflux/Aspiration syndromes,  occult CHF, or  especially side effect of medications commonly used in this population, especially non specific Beta blockers and amiodarone     Of these the most likely explanation of his recurrent cough and his new doe is not copd but obesity complicated by gerd. Explained the natural history of uri and why it's necessary in patients at risk to treat GERD aggressively - at least  short term -   to reduce risk of evolving cyclical cough initially  triggered by epithelial injury and a heightened sensitivty to the effects of any upper airway irritants,  most importantly acid - related - then perpetuated by epithelial injury related to the cough itself as the upper airway collapses on itself.  That is, the more sensitive the epithelium becomes once it is damaged by the virus, the more the ensuing irritability> the more the cough, the more the secondary reflux (especially in those prone to reflux) the more the irritation of the sensitive mucosa and so on in a  Classic cyclical pattern.    For now rec diet/ wt loss/ elevation of hob and if cough flares > ppi and h2hs while awaiting baseline pfts  See instructions for specific recommendations which were reviewed directly with the patient who was given a copy with highlighter outlining the key components.

## 2014-12-31 NOTE — Patient Instructions (Signed)
GERD (REFLUX)  is an extremely common cause of respiratory symptoms just like yours , many times with no obvious heartburn at all.    It can be treated with medication, but also with lifestyle changes including elevation of head of breath (bed blocks) avoidance of late meals, excessive alcohol, smoking cessation, and avoid fatty foods, chocolate, peppermint, colas, red wine, and acidic juices such as orange juice.  NO MINT OR MENTHOL PRODUCTS SO NO COUGH DROPS  USE SUGARLESS CANDY INSTEAD (Jolley ranchers or Stover's or Life Savers) or even ice chips will also do - the key is to swallow to prevent all throat clearing. NO OIL BASED VITAMINS - use powdered substitutes.   If cough comes back > Try prilosec 20mg   Take 30-60 min before first meal of the day and Pepcid ac 20 mg one at bedtime until cough is completely gone for at least a week without the need for cough suppression.  Please schedule a follow up office visit in 6 weeks, call sooner if needed with pfts on return

## 2015-01-15 DIAGNOSIS — R05 Cough: Secondary | ICD-10-CM | POA: Diagnosis not present

## 2015-01-19 DIAGNOSIS — I10 Essential (primary) hypertension: Secondary | ICD-10-CM | POA: Diagnosis not present

## 2015-01-19 DIAGNOSIS — H1089 Other conjunctivitis: Secondary | ICD-10-CM | POA: Diagnosis not present

## 2015-01-19 DIAGNOSIS — H1033 Unspecified acute conjunctivitis, bilateral: Secondary | ICD-10-CM | POA: Diagnosis not present

## 2015-01-21 DIAGNOSIS — I483 Typical atrial flutter: Secondary | ICD-10-CM | POA: Insufficient documentation

## 2015-01-21 DIAGNOSIS — I5032 Chronic diastolic (congestive) heart failure: Secondary | ICD-10-CM | POA: Insufficient documentation

## 2015-01-21 DIAGNOSIS — Z7901 Long term (current) use of anticoagulants: Secondary | ICD-10-CM | POA: Insufficient documentation

## 2015-01-21 DIAGNOSIS — I10 Essential (primary) hypertension: Secondary | ICD-10-CM

## 2015-01-21 DIAGNOSIS — I2510 Atherosclerotic heart disease of native coronary artery without angina pectoris: Secondary | ICD-10-CM | POA: Insufficient documentation

## 2015-01-21 DIAGNOSIS — Z79899 Other long term (current) drug therapy: Secondary | ICD-10-CM

## 2015-01-21 DIAGNOSIS — I119 Hypertensive heart disease without heart failure: Secondary | ICD-10-CM

## 2015-01-21 HISTORY — DX: Atherosclerotic heart disease of native coronary artery without angina pectoris: I25.10

## 2015-01-21 HISTORY — DX: Typical atrial flutter: I48.3

## 2015-01-21 HISTORY — DX: Chronic diastolic (congestive) heart failure: I50.32

## 2015-01-21 HISTORY — DX: Essential (primary) hypertension: I10

## 2015-01-21 HISTORY — DX: Long term (current) use of anticoagulants: Z79.01

## 2015-01-21 HISTORY — DX: Other long term (current) drug therapy: Z79.899

## 2015-01-21 HISTORY — DX: Hypertensive heart disease without heart failure: I11.9

## 2015-01-22 DIAGNOSIS — I2510 Atherosclerotic heart disease of native coronary artery without angina pectoris: Secondary | ICD-10-CM | POA: Diagnosis not present

## 2015-01-22 DIAGNOSIS — I480 Paroxysmal atrial fibrillation: Secondary | ICD-10-CM | POA: Diagnosis not present

## 2015-01-22 DIAGNOSIS — Z79899 Other long term (current) drug therapy: Secondary | ICD-10-CM | POA: Diagnosis not present

## 2015-01-22 DIAGNOSIS — I5032 Chronic diastolic (congestive) heart failure: Secondary | ICD-10-CM | POA: Diagnosis not present

## 2015-01-22 DIAGNOSIS — Z6841 Body Mass Index (BMI) 40.0 and over, adult: Secondary | ICD-10-CM | POA: Diagnosis not present

## 2015-01-22 DIAGNOSIS — I10 Essential (primary) hypertension: Secondary | ICD-10-CM | POA: Diagnosis not present

## 2015-01-27 ENCOUNTER — Institutional Professional Consult (permissible substitution): Payer: Self-pay | Admitting: Critical Care Medicine

## 2015-02-03 DIAGNOSIS — H25813 Combined forms of age-related cataract, bilateral: Secondary | ICD-10-CM | POA: Diagnosis not present

## 2015-02-11 ENCOUNTER — Ambulatory Visit: Payer: Medicare Other | Admitting: Internal Medicine

## 2015-03-10 DIAGNOSIS — H04123 Dry eye syndrome of bilateral lacrimal glands: Secondary | ICD-10-CM | POA: Diagnosis not present

## 2015-03-11 ENCOUNTER — Ambulatory Visit: Payer: Medicare Other | Admitting: Internal Medicine

## 2015-03-11 DIAGNOSIS — H02102 Unspecified ectropion of right lower eyelid: Secondary | ICD-10-CM | POA: Diagnosis not present

## 2015-03-24 DIAGNOSIS — E782 Mixed hyperlipidemia: Secondary | ICD-10-CM | POA: Diagnosis not present

## 2015-03-24 DIAGNOSIS — I480 Paroxysmal atrial fibrillation: Secondary | ICD-10-CM | POA: Diagnosis not present

## 2015-03-24 DIAGNOSIS — E034 Atrophy of thyroid (acquired): Secondary | ICD-10-CM | POA: Diagnosis not present

## 2015-03-24 DIAGNOSIS — I110 Hypertensive heart disease with heart failure: Secondary | ICD-10-CM | POA: Diagnosis not present

## 2015-03-24 DIAGNOSIS — R7301 Impaired fasting glucose: Secondary | ICD-10-CM | POA: Diagnosis not present

## 2015-04-10 DIAGNOSIS — H02102 Unspecified ectropion of right lower eyelid: Secondary | ICD-10-CM | POA: Diagnosis not present

## 2015-04-10 DIAGNOSIS — H04223 Epiphora due to insufficient drainage, bilateral lacrimal glands: Secondary | ICD-10-CM | POA: Diagnosis not present

## 2015-04-10 DIAGNOSIS — H02105 Unspecified ectropion of left lower eyelid: Secondary | ICD-10-CM | POA: Diagnosis not present

## 2015-04-14 DIAGNOSIS — H02105 Unspecified ectropion of left lower eyelid: Secondary | ICD-10-CM | POA: Diagnosis not present

## 2015-04-14 DIAGNOSIS — H02102 Unspecified ectropion of right lower eyelid: Secondary | ICD-10-CM | POA: Diagnosis not present

## 2015-05-13 DIAGNOSIS — I5032 Chronic diastolic (congestive) heart failure: Secondary | ICD-10-CM | POA: Diagnosis not present

## 2015-05-13 DIAGNOSIS — Z79899 Other long term (current) drug therapy: Secondary | ICD-10-CM | POA: Diagnosis not present

## 2015-05-13 DIAGNOSIS — Z7901 Long term (current) use of anticoagulants: Secondary | ICD-10-CM | POA: Diagnosis not present

## 2015-05-13 DIAGNOSIS — I483 Typical atrial flutter: Secondary | ICD-10-CM | POA: Diagnosis not present

## 2015-06-02 DIAGNOSIS — J0100 Acute maxillary sinusitis, unspecified: Secondary | ICD-10-CM | POA: Diagnosis not present

## 2015-06-02 DIAGNOSIS — J209 Acute bronchitis, unspecified: Secondary | ICD-10-CM | POA: Diagnosis not present

## 2015-06-22 DIAGNOSIS — H52202 Unspecified astigmatism, left eye: Secondary | ICD-10-CM | POA: Diagnosis not present

## 2015-06-22 DIAGNOSIS — H25812 Combined forms of age-related cataract, left eye: Secondary | ICD-10-CM | POA: Diagnosis not present

## 2015-06-22 DIAGNOSIS — H2512 Age-related nuclear cataract, left eye: Secondary | ICD-10-CM | POA: Diagnosis not present

## 2015-06-30 DIAGNOSIS — I480 Paroxysmal atrial fibrillation: Secondary | ICD-10-CM | POA: Diagnosis not present

## 2015-06-30 DIAGNOSIS — I10 Essential (primary) hypertension: Secondary | ICD-10-CM | POA: Diagnosis not present

## 2015-06-30 DIAGNOSIS — E034 Atrophy of thyroid (acquired): Secondary | ICD-10-CM | POA: Diagnosis not present

## 2015-06-30 DIAGNOSIS — R7301 Impaired fasting glucose: Secondary | ICD-10-CM | POA: Diagnosis not present

## 2015-06-30 DIAGNOSIS — I110 Hypertensive heart disease with heart failure: Secondary | ICD-10-CM | POA: Diagnosis not present

## 2015-06-30 DIAGNOSIS — I5032 Chronic diastolic (congestive) heart failure: Secondary | ICD-10-CM | POA: Diagnosis not present

## 2015-06-30 DIAGNOSIS — E782 Mixed hyperlipidemia: Secondary | ICD-10-CM | POA: Diagnosis not present

## 2015-06-30 DIAGNOSIS — J018 Other acute sinusitis: Secondary | ICD-10-CM | POA: Diagnosis not present

## 2015-07-08 DIAGNOSIS — H2511 Age-related nuclear cataract, right eye: Secondary | ICD-10-CM | POA: Diagnosis not present

## 2015-07-08 DIAGNOSIS — H25811 Combined forms of age-related cataract, right eye: Secondary | ICD-10-CM | POA: Diagnosis not present

## 2015-07-30 DIAGNOSIS — Z23 Encounter for immunization: Secondary | ICD-10-CM | POA: Diagnosis not present

## 2015-07-30 DIAGNOSIS — J301 Allergic rhinitis due to pollen: Secondary | ICD-10-CM | POA: Diagnosis not present

## 2015-08-07 DIAGNOSIS — D2239 Melanocytic nevi of other parts of face: Secondary | ICD-10-CM | POA: Diagnosis not present

## 2015-08-07 DIAGNOSIS — L570 Actinic keratosis: Secondary | ICD-10-CM | POA: Diagnosis not present

## 2015-08-11 DIAGNOSIS — J018 Other acute sinusitis: Secondary | ICD-10-CM | POA: Diagnosis not present

## 2015-09-12 DIAGNOSIS — M25512 Pain in left shoulder: Secondary | ICD-10-CM | POA: Diagnosis not present

## 2015-09-12 DIAGNOSIS — S43102A Unspecified dislocation of left acromioclavicular joint, initial encounter: Secondary | ICD-10-CM | POA: Diagnosis not present

## 2015-09-21 DIAGNOSIS — M25512 Pain in left shoulder: Secondary | ICD-10-CM | POA: Diagnosis not present

## 2015-09-25 DIAGNOSIS — S46812A Strain of other muscles, fascia and tendons at shoulder and upper arm level, left arm, initial encounter: Secondary | ICD-10-CM | POA: Diagnosis not present

## 2015-09-25 DIAGNOSIS — W19XXXA Unspecified fall, initial encounter: Secondary | ICD-10-CM | POA: Diagnosis not present

## 2015-09-25 DIAGNOSIS — M25512 Pain in left shoulder: Secondary | ICD-10-CM | POA: Diagnosis not present

## 2015-09-25 DIAGNOSIS — S46012A Strain of muscle(s) and tendon(s) of the rotator cuff of left shoulder, initial encounter: Secondary | ICD-10-CM | POA: Diagnosis not present

## 2015-09-25 DIAGNOSIS — M25412 Effusion, left shoulder: Secondary | ICD-10-CM | POA: Diagnosis not present

## 2015-09-28 DIAGNOSIS — M75122 Complete rotator cuff tear or rupture of left shoulder, not specified as traumatic: Secondary | ICD-10-CM | POA: Diagnosis not present

## 2015-09-28 DIAGNOSIS — M25512 Pain in left shoulder: Secondary | ICD-10-CM | POA: Diagnosis not present

## 2015-10-02 DIAGNOSIS — I10 Essential (primary) hypertension: Secondary | ICD-10-CM | POA: Diagnosis not present

## 2015-10-02 DIAGNOSIS — I5032 Chronic diastolic (congestive) heart failure: Secondary | ICD-10-CM | POA: Diagnosis not present

## 2015-10-02 DIAGNOSIS — Z7901 Long term (current) use of anticoagulants: Secondary | ICD-10-CM | POA: Diagnosis not present

## 2015-10-02 DIAGNOSIS — I110 Hypertensive heart disease with heart failure: Secondary | ICD-10-CM | POA: Diagnosis not present

## 2015-10-02 DIAGNOSIS — Z79899 Other long term (current) drug therapy: Secondary | ICD-10-CM | POA: Diagnosis not present

## 2015-10-02 DIAGNOSIS — Z6836 Body mass index (BMI) 36.0-36.9, adult: Secondary | ICD-10-CM | POA: Diagnosis not present

## 2015-10-02 DIAGNOSIS — I483 Typical atrial flutter: Secondary | ICD-10-CM | POA: Diagnosis not present

## 2015-10-02 DIAGNOSIS — R7301 Impaired fasting glucose: Secondary | ICD-10-CM | POA: Diagnosis not present

## 2015-10-02 DIAGNOSIS — E782 Mixed hyperlipidemia: Secondary | ICD-10-CM | POA: Diagnosis not present

## 2015-10-02 DIAGNOSIS — E034 Atrophy of thyroid (acquired): Secondary | ICD-10-CM | POA: Diagnosis not present

## 2015-10-02 DIAGNOSIS — I4892 Unspecified atrial flutter: Secondary | ICD-10-CM | POA: Diagnosis not present

## 2015-10-02 DIAGNOSIS — I2510 Atherosclerotic heart disease of native coronary artery without angina pectoris: Secondary | ICD-10-CM | POA: Diagnosis not present

## 2015-10-05 DIAGNOSIS — I110 Hypertensive heart disease with heart failure: Secondary | ICD-10-CM | POA: Diagnosis not present

## 2015-10-06 DIAGNOSIS — I2510 Atherosclerotic heart disease of native coronary artery without angina pectoris: Secondary | ICD-10-CM | POA: Diagnosis not present

## 2015-10-13 DIAGNOSIS — Z79899 Other long term (current) drug therapy: Secondary | ICD-10-CM | POA: Diagnosis not present

## 2015-10-13 DIAGNOSIS — G8918 Other acute postprocedural pain: Secondary | ICD-10-CM | POA: Diagnosis not present

## 2015-10-13 DIAGNOSIS — M65812 Other synovitis and tenosynovitis, left shoulder: Secondary | ICD-10-CM | POA: Diagnosis not present

## 2015-10-13 DIAGNOSIS — S46012A Strain of muscle(s) and tendon(s) of the rotator cuff of left shoulder, initial encounter: Secondary | ICD-10-CM | POA: Diagnosis not present

## 2015-10-13 DIAGNOSIS — I252 Old myocardial infarction: Secondary | ICD-10-CM | POA: Diagnosis not present

## 2015-10-13 DIAGNOSIS — Z7901 Long term (current) use of anticoagulants: Secondary | ICD-10-CM | POA: Diagnosis not present

## 2015-10-13 DIAGNOSIS — M19012 Primary osteoarthritis, left shoulder: Secondary | ICD-10-CM | POA: Diagnosis not present

## 2015-10-13 DIAGNOSIS — I519 Heart disease, unspecified: Secondary | ICD-10-CM | POA: Diagnosis not present

## 2015-10-13 DIAGNOSIS — E7800 Pure hypercholesterolemia, unspecified: Secondary | ICD-10-CM | POA: Diagnosis not present

## 2015-10-13 DIAGNOSIS — Z87891 Personal history of nicotine dependence: Secondary | ICD-10-CM | POA: Diagnosis not present

## 2015-10-13 DIAGNOSIS — M67922 Unspecified disorder of synovium and tendon, left upper arm: Secondary | ICD-10-CM | POA: Diagnosis not present

## 2015-10-13 DIAGNOSIS — M25812 Other specified joint disorders, left shoulder: Secondary | ICD-10-CM | POA: Diagnosis not present

## 2015-10-13 DIAGNOSIS — M25512 Pain in left shoulder: Secondary | ICD-10-CM | POA: Diagnosis not present

## 2015-10-13 DIAGNOSIS — I10 Essential (primary) hypertension: Secondary | ICD-10-CM | POA: Diagnosis not present

## 2015-10-13 DIAGNOSIS — M75122 Complete rotator cuff tear or rupture of left shoulder, not specified as traumatic: Secondary | ICD-10-CM | POA: Diagnosis not present

## 2015-10-13 DIAGNOSIS — I4891 Unspecified atrial fibrillation: Secondary | ICD-10-CM | POA: Diagnosis not present

## 2015-10-14 DIAGNOSIS — I519 Heart disease, unspecified: Secondary | ICD-10-CM | POA: Diagnosis not present

## 2015-10-14 DIAGNOSIS — I10 Essential (primary) hypertension: Secondary | ICD-10-CM | POA: Diagnosis not present

## 2015-10-14 DIAGNOSIS — M65812 Other synovitis and tenosynovitis, left shoulder: Secondary | ICD-10-CM | POA: Diagnosis not present

## 2015-10-14 DIAGNOSIS — M75122 Complete rotator cuff tear or rupture of left shoulder, not specified as traumatic: Secondary | ICD-10-CM | POA: Diagnosis not present

## 2015-10-14 DIAGNOSIS — M25812 Other specified joint disorders, left shoulder: Secondary | ICD-10-CM | POA: Diagnosis not present

## 2015-10-14 DIAGNOSIS — M25512 Pain in left shoulder: Secondary | ICD-10-CM | POA: Diagnosis not present

## 2015-10-27 DIAGNOSIS — J018 Other acute sinusitis: Secondary | ICD-10-CM | POA: Diagnosis not present

## 2015-11-09 DIAGNOSIS — L720 Epidermal cyst: Secondary | ICD-10-CM | POA: Diagnosis not present

## 2015-11-09 DIAGNOSIS — L918 Other hypertrophic disorders of the skin: Secondary | ICD-10-CM | POA: Diagnosis not present

## 2015-11-09 DIAGNOSIS — L570 Actinic keratosis: Secondary | ICD-10-CM | POA: Diagnosis not present

## 2015-11-10 DIAGNOSIS — J018 Other acute sinusitis: Secondary | ICD-10-CM | POA: Diagnosis not present

## 2015-11-30 DIAGNOSIS — M25512 Pain in left shoulder: Secondary | ICD-10-CM | POA: Diagnosis not present

## 2015-11-30 DIAGNOSIS — M6281 Muscle weakness (generalized): Secondary | ICD-10-CM | POA: Diagnosis not present

## 2015-11-30 DIAGNOSIS — M75122 Complete rotator cuff tear or rupture of left shoulder, not specified as traumatic: Secondary | ICD-10-CM | POA: Diagnosis not present

## 2015-12-02 DIAGNOSIS — M25512 Pain in left shoulder: Secondary | ICD-10-CM | POA: Diagnosis not present

## 2015-12-02 DIAGNOSIS — M75122 Complete rotator cuff tear or rupture of left shoulder, not specified as traumatic: Secondary | ICD-10-CM | POA: Diagnosis not present

## 2015-12-02 DIAGNOSIS — M6281 Muscle weakness (generalized): Secondary | ICD-10-CM | POA: Diagnosis not present

## 2015-12-14 DIAGNOSIS — M25512 Pain in left shoulder: Secondary | ICD-10-CM | POA: Diagnosis not present

## 2015-12-14 DIAGNOSIS — M75122 Complete rotator cuff tear or rupture of left shoulder, not specified as traumatic: Secondary | ICD-10-CM | POA: Diagnosis not present

## 2015-12-14 DIAGNOSIS — M6281 Muscle weakness (generalized): Secondary | ICD-10-CM | POA: Diagnosis not present

## 2015-12-17 DIAGNOSIS — M6281 Muscle weakness (generalized): Secondary | ICD-10-CM | POA: Diagnosis not present

## 2015-12-17 DIAGNOSIS — M75122 Complete rotator cuff tear or rupture of left shoulder, not specified as traumatic: Secondary | ICD-10-CM | POA: Diagnosis not present

## 2015-12-17 DIAGNOSIS — M25512 Pain in left shoulder: Secondary | ICD-10-CM | POA: Diagnosis not present

## 2015-12-21 DIAGNOSIS — M25512 Pain in left shoulder: Secondary | ICD-10-CM | POA: Diagnosis not present

## 2015-12-21 DIAGNOSIS — M75122 Complete rotator cuff tear or rupture of left shoulder, not specified as traumatic: Secondary | ICD-10-CM | POA: Diagnosis not present

## 2015-12-21 DIAGNOSIS — M6281 Muscle weakness (generalized): Secondary | ICD-10-CM | POA: Diagnosis not present

## 2015-12-23 DIAGNOSIS — M75122 Complete rotator cuff tear or rupture of left shoulder, not specified as traumatic: Secondary | ICD-10-CM | POA: Diagnosis not present

## 2015-12-24 DIAGNOSIS — M75122 Complete rotator cuff tear or rupture of left shoulder, not specified as traumatic: Secondary | ICD-10-CM | POA: Diagnosis not present

## 2015-12-24 DIAGNOSIS — M25512 Pain in left shoulder: Secondary | ICD-10-CM | POA: Diagnosis not present

## 2015-12-24 DIAGNOSIS — M6281 Muscle weakness (generalized): Secondary | ICD-10-CM | POA: Diagnosis not present

## 2015-12-28 DIAGNOSIS — M25512 Pain in left shoulder: Secondary | ICD-10-CM | POA: Diagnosis not present

## 2015-12-28 DIAGNOSIS — M6281 Muscle weakness (generalized): Secondary | ICD-10-CM | POA: Diagnosis not present

## 2015-12-28 DIAGNOSIS — M75122 Complete rotator cuff tear or rupture of left shoulder, not specified as traumatic: Secondary | ICD-10-CM | POA: Diagnosis not present

## 2015-12-30 DIAGNOSIS — I2510 Atherosclerotic heart disease of native coronary artery without angina pectoris: Secondary | ICD-10-CM | POA: Diagnosis not present

## 2015-12-30 DIAGNOSIS — Z79899 Other long term (current) drug therapy: Secondary | ICD-10-CM | POA: Diagnosis not present

## 2015-12-30 DIAGNOSIS — Z6835 Body mass index (BMI) 35.0-35.9, adult: Secondary | ICD-10-CM | POA: Diagnosis not present

## 2015-12-30 DIAGNOSIS — Z7901 Long term (current) use of anticoagulants: Secondary | ICD-10-CM | POA: Diagnosis not present

## 2015-12-30 DIAGNOSIS — I483 Typical atrial flutter: Secondary | ICD-10-CM | POA: Diagnosis not present

## 2015-12-30 DIAGNOSIS — I119 Hypertensive heart disease without heart failure: Secondary | ICD-10-CM | POA: Diagnosis not present

## 2015-12-30 DIAGNOSIS — I5032 Chronic diastolic (congestive) heart failure: Secondary | ICD-10-CM | POA: Diagnosis not present

## 2015-12-31 DIAGNOSIS — M25512 Pain in left shoulder: Secondary | ICD-10-CM | POA: Diagnosis not present

## 2015-12-31 DIAGNOSIS — M6281 Muscle weakness (generalized): Secondary | ICD-10-CM | POA: Diagnosis not present

## 2015-12-31 DIAGNOSIS — M75122 Complete rotator cuff tear or rupture of left shoulder, not specified as traumatic: Secondary | ICD-10-CM | POA: Diagnosis not present

## 2016-01-01 DIAGNOSIS — J449 Chronic obstructive pulmonary disease, unspecified: Secondary | ICD-10-CM | POA: Diagnosis not present

## 2016-01-01 DIAGNOSIS — I110 Hypertensive heart disease with heart failure: Secondary | ICD-10-CM | POA: Diagnosis not present

## 2016-01-01 DIAGNOSIS — I5032 Chronic diastolic (congestive) heart failure: Secondary | ICD-10-CM | POA: Diagnosis not present

## 2016-01-01 DIAGNOSIS — I10 Essential (primary) hypertension: Secondary | ICD-10-CM | POA: Diagnosis not present

## 2016-01-01 DIAGNOSIS — R7301 Impaired fasting glucose: Secondary | ICD-10-CM | POA: Diagnosis not present

## 2016-01-01 DIAGNOSIS — E034 Atrophy of thyroid (acquired): Secondary | ICD-10-CM | POA: Diagnosis not present

## 2016-01-01 DIAGNOSIS — I480 Paroxysmal atrial fibrillation: Secondary | ICD-10-CM | POA: Diagnosis not present

## 2016-01-01 DIAGNOSIS — E782 Mixed hyperlipidemia: Secondary | ICD-10-CM | POA: Diagnosis not present

## 2016-01-05 DIAGNOSIS — M6281 Muscle weakness (generalized): Secondary | ICD-10-CM | POA: Diagnosis not present

## 2016-01-05 DIAGNOSIS — M25512 Pain in left shoulder: Secondary | ICD-10-CM | POA: Diagnosis not present

## 2016-01-05 DIAGNOSIS — M75122 Complete rotator cuff tear or rupture of left shoulder, not specified as traumatic: Secondary | ICD-10-CM | POA: Diagnosis not present

## 2016-01-07 DIAGNOSIS — M75122 Complete rotator cuff tear or rupture of left shoulder, not specified as traumatic: Secondary | ICD-10-CM | POA: Diagnosis not present

## 2016-01-07 DIAGNOSIS — M6281 Muscle weakness (generalized): Secondary | ICD-10-CM | POA: Diagnosis not present

## 2016-01-07 DIAGNOSIS — M25512 Pain in left shoulder: Secondary | ICD-10-CM | POA: Diagnosis not present

## 2016-01-11 DIAGNOSIS — M25512 Pain in left shoulder: Secondary | ICD-10-CM | POA: Diagnosis not present

## 2016-01-11 DIAGNOSIS — M6281 Muscle weakness (generalized): Secondary | ICD-10-CM | POA: Diagnosis not present

## 2016-01-11 DIAGNOSIS — M75122 Complete rotator cuff tear or rupture of left shoulder, not specified as traumatic: Secondary | ICD-10-CM | POA: Diagnosis not present

## 2016-01-14 DIAGNOSIS — M25512 Pain in left shoulder: Secondary | ICD-10-CM | POA: Diagnosis not present

## 2016-01-14 DIAGNOSIS — M75122 Complete rotator cuff tear or rupture of left shoulder, not specified as traumatic: Secondary | ICD-10-CM | POA: Diagnosis not present

## 2016-01-14 DIAGNOSIS — M6281 Muscle weakness (generalized): Secondary | ICD-10-CM | POA: Diagnosis not present

## 2016-01-19 DIAGNOSIS — M6281 Muscle weakness (generalized): Secondary | ICD-10-CM | POA: Diagnosis not present

## 2016-01-19 DIAGNOSIS — M25512 Pain in left shoulder: Secondary | ICD-10-CM | POA: Diagnosis not present

## 2016-01-19 DIAGNOSIS — M75122 Complete rotator cuff tear or rupture of left shoulder, not specified as traumatic: Secondary | ICD-10-CM | POA: Diagnosis not present

## 2016-01-20 DIAGNOSIS — M75122 Complete rotator cuff tear or rupture of left shoulder, not specified as traumatic: Secondary | ICD-10-CM | POA: Diagnosis not present

## 2016-01-20 DIAGNOSIS — H26492 Other secondary cataract, left eye: Secondary | ICD-10-CM | POA: Diagnosis not present

## 2016-02-11 DIAGNOSIS — H53483 Generalized contraction of visual field, bilateral: Secondary | ICD-10-CM | POA: Diagnosis not present

## 2016-02-11 DIAGNOSIS — H26492 Other secondary cataract, left eye: Secondary | ICD-10-CM | POA: Diagnosis not present

## 2016-04-04 DIAGNOSIS — R7301 Impaired fasting glucose: Secondary | ICD-10-CM | POA: Diagnosis not present

## 2016-04-04 DIAGNOSIS — I5032 Chronic diastolic (congestive) heart failure: Secondary | ICD-10-CM | POA: Diagnosis not present

## 2016-04-04 DIAGNOSIS — I10 Essential (primary) hypertension: Secondary | ICD-10-CM | POA: Diagnosis not present

## 2016-04-04 DIAGNOSIS — I480 Paroxysmal atrial fibrillation: Secondary | ICD-10-CM | POA: Diagnosis not present

## 2016-04-04 DIAGNOSIS — I110 Hypertensive heart disease with heart failure: Secondary | ICD-10-CM | POA: Diagnosis not present

## 2016-04-04 DIAGNOSIS — E034 Atrophy of thyroid (acquired): Secondary | ICD-10-CM | POA: Diagnosis not present

## 2016-04-04 DIAGNOSIS — E782 Mixed hyperlipidemia: Secondary | ICD-10-CM | POA: Diagnosis not present

## 2016-04-21 DIAGNOSIS — M75122 Complete rotator cuff tear or rupture of left shoulder, not specified as traumatic: Secondary | ICD-10-CM | POA: Diagnosis not present

## 2016-06-01 DIAGNOSIS — I483 Typical atrial flutter: Secondary | ICD-10-CM | POA: Diagnosis not present

## 2016-06-01 DIAGNOSIS — Z6836 Body mass index (BMI) 36.0-36.9, adult: Secondary | ICD-10-CM | POA: Diagnosis not present

## 2016-06-01 DIAGNOSIS — Z79899 Other long term (current) drug therapy: Secondary | ICD-10-CM | POA: Diagnosis not present

## 2016-06-01 DIAGNOSIS — Z7901 Long term (current) use of anticoagulants: Secondary | ICD-10-CM | POA: Diagnosis not present

## 2016-06-01 DIAGNOSIS — I119 Hypertensive heart disease without heart failure: Secondary | ICD-10-CM | POA: Diagnosis not present

## 2016-06-01 DIAGNOSIS — I5032 Chronic diastolic (congestive) heart failure: Secondary | ICD-10-CM | POA: Diagnosis not present

## 2016-06-01 DIAGNOSIS — Z23 Encounter for immunization: Secondary | ICD-10-CM | POA: Diagnosis not present

## 2016-06-01 DIAGNOSIS — I2510 Atherosclerotic heart disease of native coronary artery without angina pectoris: Secondary | ICD-10-CM | POA: Diagnosis not present

## 2016-07-06 DIAGNOSIS — I5032 Chronic diastolic (congestive) heart failure: Secondary | ICD-10-CM | POA: Diagnosis not present

## 2016-07-06 DIAGNOSIS — H538 Other visual disturbances: Secondary | ICD-10-CM | POA: Diagnosis not present

## 2016-07-06 DIAGNOSIS — R7301 Impaired fasting glucose: Secondary | ICD-10-CM | POA: Diagnosis not present

## 2016-07-06 DIAGNOSIS — I110 Hypertensive heart disease with heart failure: Secondary | ICD-10-CM | POA: Diagnosis not present

## 2016-07-06 DIAGNOSIS — E034 Atrophy of thyroid (acquired): Secondary | ICD-10-CM | POA: Diagnosis not present

## 2016-07-06 DIAGNOSIS — E782 Mixed hyperlipidemia: Secondary | ICD-10-CM | POA: Diagnosis not present

## 2016-07-06 DIAGNOSIS — I480 Paroxysmal atrial fibrillation: Secondary | ICD-10-CM | POA: Diagnosis not present

## 2016-07-15 DIAGNOSIS — R0602 Shortness of breath: Secondary | ICD-10-CM | POA: Diagnosis not present

## 2016-07-15 DIAGNOSIS — R0600 Dyspnea, unspecified: Secondary | ICD-10-CM | POA: Diagnosis not present

## 2016-07-15 DIAGNOSIS — J069 Acute upper respiratory infection, unspecified: Secondary | ICD-10-CM | POA: Diagnosis not present

## 2016-09-29 DIAGNOSIS — I639 Cerebral infarction, unspecified: Secondary | ICD-10-CM | POA: Insufficient documentation

## 2016-09-29 HISTORY — DX: Cerebral infarction, unspecified: I63.9

## 2016-09-30 DIAGNOSIS — Z87891 Personal history of nicotine dependence: Secondary | ICD-10-CM | POA: Diagnosis not present

## 2016-09-30 DIAGNOSIS — Z79899 Other long term (current) drug therapy: Secondary | ICD-10-CM | POA: Diagnosis not present

## 2016-09-30 DIAGNOSIS — Z888 Allergy status to other drugs, medicaments and biological substances status: Secondary | ICD-10-CM | POA: Diagnosis not present

## 2016-09-30 DIAGNOSIS — H01009 Unspecified blepharitis unspecified eye, unspecified eyelid: Secondary | ICD-10-CM | POA: Diagnosis not present

## 2016-09-30 DIAGNOSIS — Z9889 Other specified postprocedural states: Secondary | ICD-10-CM | POA: Diagnosis not present

## 2016-09-30 DIAGNOSIS — I509 Heart failure, unspecified: Secondary | ICD-10-CM | POA: Diagnosis not present

## 2016-09-30 DIAGNOSIS — H53461 Homonymous bilateral field defects, right side: Secondary | ICD-10-CM | POA: Diagnosis not present

## 2016-09-30 DIAGNOSIS — Z961 Presence of intraocular lens: Secondary | ICD-10-CM | POA: Diagnosis not present

## 2016-09-30 DIAGNOSIS — I110 Hypertensive heart disease with heart failure: Secondary | ICD-10-CM | POA: Diagnosis not present

## 2016-09-30 DIAGNOSIS — Z5181 Encounter for therapeutic drug level monitoring: Secondary | ICD-10-CM | POA: Diagnosis not present

## 2016-09-30 DIAGNOSIS — I10 Essential (primary) hypertension: Secondary | ICD-10-CM | POA: Diagnosis not present

## 2016-10-04 DIAGNOSIS — I480 Paroxysmal atrial fibrillation: Secondary | ICD-10-CM | POA: Diagnosis not present

## 2016-10-04 DIAGNOSIS — E034 Atrophy of thyroid (acquired): Secondary | ICD-10-CM | POA: Diagnosis not present

## 2016-10-04 DIAGNOSIS — R7301 Impaired fasting glucose: Secondary | ICD-10-CM | POA: Diagnosis not present

## 2016-10-04 DIAGNOSIS — E782 Mixed hyperlipidemia: Secondary | ICD-10-CM | POA: Diagnosis not present

## 2016-10-04 DIAGNOSIS — I110 Hypertensive heart disease with heart failure: Secondary | ICD-10-CM | POA: Diagnosis not present

## 2016-10-04 DIAGNOSIS — I5032 Chronic diastolic (congestive) heart failure: Secondary | ICD-10-CM | POA: Diagnosis not present

## 2016-10-04 DIAGNOSIS — I2510 Atherosclerotic heart disease of native coronary artery without angina pectoris: Secondary | ICD-10-CM | POA: Diagnosis not present

## 2016-10-10 DIAGNOSIS — E782 Mixed hyperlipidemia: Secondary | ICD-10-CM | POA: Diagnosis not present

## 2016-10-10 DIAGNOSIS — R7301 Impaired fasting glucose: Secondary | ICD-10-CM | POA: Diagnosis not present

## 2016-10-10 DIAGNOSIS — I10 Essential (primary) hypertension: Secondary | ICD-10-CM | POA: Diagnosis not present

## 2016-10-10 DIAGNOSIS — E034 Atrophy of thyroid (acquired): Secondary | ICD-10-CM | POA: Diagnosis not present

## 2016-10-14 DIAGNOSIS — I119 Hypertensive heart disease without heart failure: Secondary | ICD-10-CM | POA: Diagnosis not present

## 2016-10-14 DIAGNOSIS — I5032 Chronic diastolic (congestive) heart failure: Secondary | ICD-10-CM | POA: Diagnosis not present

## 2016-10-14 DIAGNOSIS — Z6836 Body mass index (BMI) 36.0-36.9, adult: Secondary | ICD-10-CM | POA: Diagnosis not present

## 2016-10-14 DIAGNOSIS — Z7901 Long term (current) use of anticoagulants: Secondary | ICD-10-CM | POA: Diagnosis not present

## 2016-10-14 DIAGNOSIS — Z79899 Other long term (current) drug therapy: Secondary | ICD-10-CM | POA: Diagnosis not present

## 2016-10-14 DIAGNOSIS — I2510 Atherosclerotic heart disease of native coronary artery without angina pectoris: Secondary | ICD-10-CM | POA: Diagnosis not present

## 2016-10-14 DIAGNOSIS — I483 Typical atrial flutter: Secondary | ICD-10-CM | POA: Diagnosis not present

## 2016-10-24 DIAGNOSIS — H53461 Homonymous bilateral field defects, right side: Secondary | ICD-10-CM | POA: Diagnosis not present

## 2016-10-24 DIAGNOSIS — I7789 Other specified disorders of arteries and arterioles: Secondary | ICD-10-CM | POA: Diagnosis not present

## 2016-10-24 DIAGNOSIS — G319 Degenerative disease of nervous system, unspecified: Secondary | ICD-10-CM | POA: Diagnosis not present

## 2016-10-26 DIAGNOSIS — I110 Hypertensive heart disease with heart failure: Secondary | ICD-10-CM | POA: Diagnosis not present

## 2016-11-09 DIAGNOSIS — L578 Other skin changes due to chronic exposure to nonionizing radiation: Secondary | ICD-10-CM | POA: Diagnosis not present

## 2016-11-09 DIAGNOSIS — L821 Other seborrheic keratosis: Secondary | ICD-10-CM | POA: Diagnosis not present

## 2016-11-09 DIAGNOSIS — D1801 Hemangioma of skin and subcutaneous tissue: Secondary | ICD-10-CM | POA: Diagnosis not present

## 2016-11-09 DIAGNOSIS — L570 Actinic keratosis: Secondary | ICD-10-CM | POA: Diagnosis not present

## 2016-11-18 DIAGNOSIS — H53461 Homonymous bilateral field defects, right side: Secondary | ICD-10-CM | POA: Diagnosis not present

## 2016-11-23 DIAGNOSIS — I110 Hypertensive heart disease with heart failure: Secondary | ICD-10-CM | POA: Diagnosis not present

## 2016-11-23 DIAGNOSIS — H538 Other visual disturbances: Secondary | ICD-10-CM | POA: Diagnosis not present

## 2016-12-31 DIAGNOSIS — R0602 Shortness of breath: Secondary | ICD-10-CM | POA: Diagnosis not present

## 2016-12-31 DIAGNOSIS — I509 Heart failure, unspecified: Secondary | ICD-10-CM | POA: Diagnosis not present

## 2016-12-31 DIAGNOSIS — Z951 Presence of aortocoronary bypass graft: Secondary | ICD-10-CM | POA: Diagnosis not present

## 2016-12-31 DIAGNOSIS — R5383 Other fatigue: Secondary | ICD-10-CM | POA: Diagnosis not present

## 2016-12-31 DIAGNOSIS — M6281 Muscle weakness (generalized): Secondary | ICD-10-CM | POA: Diagnosis not present

## 2016-12-31 DIAGNOSIS — I4891 Unspecified atrial fibrillation: Secondary | ICD-10-CM | POA: Diagnosis not present

## 2016-12-31 DIAGNOSIS — H539 Unspecified visual disturbance: Secondary | ICD-10-CM | POA: Diagnosis not present

## 2016-12-31 DIAGNOSIS — I110 Hypertensive heart disease with heart failure: Secondary | ICD-10-CM | POA: Diagnosis not present

## 2016-12-31 DIAGNOSIS — R531 Weakness: Secondary | ICD-10-CM | POA: Diagnosis not present

## 2016-12-31 DIAGNOSIS — Z87891 Personal history of nicotine dependence: Secondary | ICD-10-CM | POA: Diagnosis not present

## 2016-12-31 DIAGNOSIS — Z79899 Other long term (current) drug therapy: Secondary | ICD-10-CM | POA: Diagnosis not present

## 2016-12-31 DIAGNOSIS — E785 Hyperlipidemia, unspecified: Secondary | ICD-10-CM | POA: Diagnosis not present

## 2016-12-31 DIAGNOSIS — I2510 Atherosclerotic heart disease of native coronary artery without angina pectoris: Secondary | ICD-10-CM | POA: Diagnosis not present

## 2017-01-11 DIAGNOSIS — Z1211 Encounter for screening for malignant neoplasm of colon: Secondary | ICD-10-CM | POA: Diagnosis not present

## 2017-01-11 DIAGNOSIS — Z125 Encounter for screening for malignant neoplasm of prostate: Secondary | ICD-10-CM | POA: Diagnosis not present

## 2017-01-11 DIAGNOSIS — Z6841 Body Mass Index (BMI) 40.0 and over, adult: Secondary | ICD-10-CM | POA: Diagnosis not present

## 2017-01-11 DIAGNOSIS — Z0000 Encounter for general adult medical examination without abnormal findings: Secondary | ICD-10-CM | POA: Diagnosis not present

## 2017-01-11 DIAGNOSIS — R7301 Impaired fasting glucose: Secondary | ICD-10-CM | POA: Diagnosis not present

## 2017-01-11 DIAGNOSIS — E034 Atrophy of thyroid (acquired): Secondary | ICD-10-CM | POA: Diagnosis not present

## 2017-01-11 DIAGNOSIS — I10 Essential (primary) hypertension: Secondary | ICD-10-CM | POA: Diagnosis not present

## 2017-01-11 DIAGNOSIS — E782 Mixed hyperlipidemia: Secondary | ICD-10-CM | POA: Diagnosis not present

## 2017-01-31 DIAGNOSIS — Z1211 Encounter for screening for malignant neoplasm of colon: Secondary | ICD-10-CM | POA: Diagnosis not present

## 2017-01-31 DIAGNOSIS — Z1212 Encounter for screening for malignant neoplasm of rectum: Secondary | ICD-10-CM | POA: Diagnosis not present

## 2017-01-31 LAB — COLOGUARD: Cologuard: NEGATIVE

## 2017-02-06 DIAGNOSIS — Z6836 Body mass index (BMI) 36.0-36.9, adult: Secondary | ICD-10-CM | POA: Diagnosis not present

## 2017-02-06 DIAGNOSIS — I679 Cerebrovascular disease, unspecified: Secondary | ICD-10-CM | POA: Diagnosis not present

## 2017-02-06 DIAGNOSIS — I119 Hypertensive heart disease without heart failure: Secondary | ICD-10-CM | POA: Diagnosis not present

## 2017-02-06 DIAGNOSIS — Z7901 Long term (current) use of anticoagulants: Secondary | ICD-10-CM | POA: Diagnosis not present

## 2017-02-06 DIAGNOSIS — I483 Typical atrial flutter: Secondary | ICD-10-CM | POA: Diagnosis not present

## 2017-02-06 DIAGNOSIS — H53461 Homonymous bilateral field defects, right side: Secondary | ICD-10-CM | POA: Diagnosis not present

## 2017-02-06 DIAGNOSIS — I4891 Unspecified atrial fibrillation: Secondary | ICD-10-CM | POA: Diagnosis not present

## 2017-03-02 ENCOUNTER — Encounter: Payer: Self-pay | Admitting: Cardiology

## 2017-03-02 DIAGNOSIS — Z951 Presence of aortocoronary bypass graft: Secondary | ICD-10-CM | POA: Insufficient documentation

## 2017-03-02 HISTORY — DX: Presence of aortocoronary bypass graft: Z95.1

## 2017-03-03 ENCOUNTER — Encounter: Payer: Self-pay | Admitting: Cardiology

## 2017-03-03 ENCOUNTER — Ambulatory Visit (INDEPENDENT_AMBULATORY_CARE_PROVIDER_SITE_OTHER): Payer: Medicare Other | Admitting: Cardiology

## 2017-03-03 VITALS — BP 137/72 | HR 46 | Ht 70.0 in | Wt 255.0 lb

## 2017-03-03 DIAGNOSIS — I639 Cerebral infarction, unspecified: Secondary | ICD-10-CM | POA: Diagnosis not present

## 2017-03-03 DIAGNOSIS — Z7901 Long term (current) use of anticoagulants: Secondary | ICD-10-CM

## 2017-03-03 DIAGNOSIS — I5032 Chronic diastolic (congestive) heart failure: Secondary | ICD-10-CM | POA: Diagnosis not present

## 2017-03-03 DIAGNOSIS — R29898 Other symptoms and signs involving the musculoskeletal system: Secondary | ICD-10-CM | POA: Diagnosis not present

## 2017-03-03 DIAGNOSIS — Z79899 Other long term (current) drug therapy: Secondary | ICD-10-CM | POA: Diagnosis not present

## 2017-03-03 DIAGNOSIS — I483 Typical atrial flutter: Secondary | ICD-10-CM | POA: Diagnosis not present

## 2017-03-03 HISTORY — DX: Other symptoms and signs involving the musculoskeletal system: R29.898

## 2017-03-03 MED ORDER — ASPIRIN EC 81 MG PO TBEC: 81.0000 mg | DELAYED_RELEASE_TABLET | Freq: Every day | ORAL | 3 refills | Status: DC

## 2017-03-03 MED ORDER — AMIODARONE HCL 200 MG PO TABS: 200.0000 mg | ORAL_TABLET | Freq: Every day | ORAL | 3 refills | Status: DC

## 2017-03-03 NOTE — Progress Notes (Signed)
Cardiology Office Note:    Date:  03/03/2017   ID:  [name], DOB 1946/05/04, MRN [id number]  PCP:  [name], MD  Cardiologist:  Shirlee More, MD    Referring MD: [name], MD    ASSESSMENT:    1. Chronic diastolic heart failure ([place])   2. Typical atrial flutter ([place])   3. Anticoagulated   4. On amiodarone therapy   5. Cerebrovascular accident (CVA), unspecified mechanism ([place])   6. Leg fatigue    PLAN:    In order of problems listed above:  1. Poorly controlled with noncompliance last sodium restrict weight daily and take diuretic as directed. 2. Stable in sinus rhythm continue low-dose amiodarone check labs her liver toxicity thyroid dysfunction and continue the same. 3. Continue his anticoagulant, concerned about compliance I asked him to start using a pill box wife to supervise and was stroke occurring anticoagulated to take low-dose aspirin also. 4. Continue current amiodarone check labs for toxicity. 5. Occurred while anticoagulated and low-dose aspirin check cerebrovascular duplex and utilize a pill box for compliance with anticoagulant. 6. At risk for his peripheral vascular disease check segmental pressures PVR and ABI. 7. CAD stable continue current treatment with minimum dose of beta blocker along with lipid lowering therapy.  Next appointment: 3 months   Medication Adjustments/Labs and Tests Ordered: Current medicines are reviewed at length with the patient today.  Concerns regarding medicines are outlined above.  Orders Placed This Encounter  Procedures  . Comprehensive Metabolic Panel (CMET)  . TSH  . Amiodarone level  . EKG 12-Lead   Meds ordered this encounter  Medications  . aspirin EC 81 MG tablet    Sig: Take 1 tablet (81 mg total) by mouth daily.    Dispense:  90 tablet    Refill:  3    Chief Complaint  Patient presents with  . Follow-up    has had stroke since last ov and dx by [facility].     History of Present Illness:     Abdifatah Colquhoun is a 71 y.o. male with a hx of CAD, diastolic CHF, Atrial Fibrillation, HTN, S/P CABG and on amiodoarone  and a recent stroke with visual loss detected at [facility] last seen 3 months ago[facility] showed distal vertebral and proximal basilar stenosis or occlusion. He is on an anticoagulant but does not use a pill box I asked him to use a pill box with supervision by his wife for compliance and add a low-dose aspirin as he is anticoagulated with a stroke occurred. When seen by neurology his dose of beta blocker was decreased 50% will continue the same.Marland Kitchen He is not taking his diuretic he is not short of breath but his peripheral edema and I asked him to comply with the diuretic and potassium daily. His predominant clinical complaint is inability to ambulate lower extremity fatigue he is at risk for peripheral vascular disease and will have cerebrovascular duplex as well as lower extremity ABI PVR and segmental pressures. He's on amiodarone check labs today and I stress the need for compliance and follow-up with cardiology and primary care physician. Compliance with diet, lifestyle and medications: No does not use a pill box and is not taking his diuretic. Past Medical History:  Diagnosis Date  . Heart attack ([place])   . Hyperlipidemia   . Hypertension   . Stroke [facility])     Past Surgical History:  Procedure Laterality Date  . APPENDECTOMY  [place]  2006  . CORONARY ARTERY BYPASS GRAFT  2001  . TONSILLECTOMY  1975    Current Medications: Current Meds  Medication Sig  . amiodarone (PACERONE) 200 MG tablet Take 200 mg by mouth daily.  Marland Kitchen apixaban (ELIQUIS) 5 MG TABS tablet Take 5 mg by mouth 2 (two) times daily.  . carvedilol (COREG) 3.125 MG tablet Take 3.125 mg by mouth daily.   . cloNIDine (CATAPRES) 0.1 MG tablet Take 0.1 mg by mouth daily.  . furosemide (LASIX) 40 MG tablet Take 40 mg by mouth 2 (two) times daily.  Marland Kitchen levothyroxine (SYNTHROID,  LEVOTHROID) 125 MCG tablet Take 150 mcg by mouth daily.  . pravastatin (PRAVACHOL) 80 MG tablet Take 80 mg by mouth daily.  . valsartan (DIOVAN) 320 MG tablet Take 320 mg by mouth daily.  Marland Kitchen zolpidem (AMBIEN) 5 MG tablet Take 5 mg by mouth at bedtime as needed for sleep.     Allergies:   Prednisone   Social History   Social History  . Marital status: Married    Spouse name: N/A  . Number of children: N/A  . Years of education: N/A   Occupational History  . Retired    Social History Main Topics  . Smoking status: Former Smoker    Packs/day: 1.00    Years: 15.00    Types: Cigarettes    Quit date: 08/29/1978  . Smokeless tobacco: Never Used  . Alcohol use No  . Drug use: No  . Sexual activity: Not Asked   Other Topics Concern  . None   Social History Narrative  . None     Family History: The patient's family history includes Breast cancer in his mother; Heart disease in his father. ROS:   Please see the history of present illness.    All other systems reviewed and are negative.  EKGs/Labs/Other Studies Reviewed:    The following studies were reviewed today:  EKG:  EKG ordered today.  The ekg ordered today demonstrates [place] 47 BPM old MI  Recent Labs: No results found for requested labs within last 8760 hours.  Recent Lipid Panel No results found for: CHOL, TRIG, HDL, CHOLHDL, VLDL, LDLCALC, LDLDIRECT  MRI/ MRA  at [facility]:  Impression: 1. Unremarkable MRI of the orbits. 2. Findings of chronic microvascular disease. Age-related cerebral atrophy is felt to be advanced for age, with prominence of the ventricles which is somewhat out of proportion to the sulci. This may be of no clinical significance but can also be seen in the setting of normal pressure hydrocephalus. 3. Diminutive distal right vertebral artery with the distal most aspect of the flow void not visualized, possibly due to chronic occlusion. Physical Exam:    VS:  BP 137/72   Pulse (!) 46   Ht 5'  10" (1.778 m)   Wt 255 lb (115.7 kg)   SpO2 96%   BMI 36.59 kg/m     Wt Readings from Last 3 Encounters:  03/03/17 255 lb (115.7 kg)  12/31/14 283 lb (128.4 kg)     GEN:  Well nourished, well developed in no acute distress HEENT: Normal NECK: No JVD; No carotid bruits LYMPHATICS: No lymphadenopathy CARDIAC: RRR, no murmurs, rubs, gallops RESPIRATORY:  Clear to auscultation without rales, wheezing or rhonchi  ABDOMEN: Soft, non-tender, non-distended MUSCULOSKELETAL:  2+ edema; No deformity  SKIN: Warm and dry NEUROLOGIC:  Alert and oriented x 3 PSYCHIATRIC:  Normal affect    Signed, Shirlee More, MD  03/03/2017  10:32 AM    [facility]

## 2017-03-03 NOTE — Patient Instructions (Addendum)
You need to use a pill box  Medication Instructions:  Your physician has recommended you make the following change in your medication:  START asprin 81 mg daily. You can buy this over the counter.   Labwork: Your physician recommends that you return for lab work in: today. TSH, CMP, BNP, amiodarone level   Testing/Procedures: Your physician has requested that you have a lower or upper extremity venous duplex. This test is an ultrasound of the veins in the legs or arms. It looks at venous blood flow that carries blood from the heart to the legs or arms. Allow one hour for a Lower Venous exam. Allow thirty minutes for an Upper Venous exam. There are no restrictions or special instructions.   Your physician has requested that you have a carotid duplex. This test is an ultrasound of the carotid arteries in your neck. It looks at blood flow through these arteries that supply the brain with blood. Allow one hour for this exam. There are no restrictions or special instructions.  These tests will be performed at [facility], [place], [place] [address]   Follow-Up: Your physician wants you to follow-up in: 3 months. You will receive a reminder letter in the mail two months in advance. If you don't receive a letter, please call our office to schedule the follow-up appointment.   Any Other Special Instructions Will Be Listed Below (If Applicable).     If you need a refill on your cardiac medications before your next appointment, please call your pharmacy.    Heart Failure Heart failure is a condition in which the heart has trouble pumping blood because it has become weak or stiff. This means that the heart does not pump blood efficiently for the body to work well. For some people with heart failure, fluid may back up into the lungs and there may be swelling (edema) in the lower legs. Heart failure is usually a long-term (chronic) condition. It is important for you to take good care  of yourself and follow the treatment plan from your health care provider. What are the causes? This condition is caused by some health problems, including:  High blood pressure (hypertension). Hypertension causes the heart muscle to work harder than normal. High blood pressure eventually causes the heart to become stiff and weak.  Coronary artery disease (CAD). CAD is the buildup of cholesterol and fat (plaques) in the arteries of the heart.  Heart attack (myocardial infarction). Injured tissue, which is caused by the heart attack, does not contract as well and the heart's ability to pump blood is weakened.  Abnormal heart valves. When the heart valves do not open and close properly, the heart muscle must pump harder to keep the blood flowing.  Heart muscle disease (cardiomyopathy or myocarditis). Heart muscle disease is damage to the heart muscle from a variety of causes, such as drug or alcohol abuse, infections, or unknown causes. These can increase the risk of heart failure.  Lung disease. When the lungs do not work properly, the heart must work harder.  What increases the risk? Risk of heart failure increases as a person ages. This condition is also more likely to develop in people who:  Are overweight.  Are male.  Smoke or chew tobacco.  Abuse alcohol or illegal drugs.  Have taken medicines that can damage the heart, such as chemotherapy drugs.  Have diabetes. ? High blood sugar (glucose) is associated with high fat (lipid) levels in the blood. ? Diabetes  can also damage tiny blood vessels that carry nutrients to the heart muscle.  Have abnormal heart rhythms.  Have thyroid problems.  Have low blood counts (anemia).  What are the signs or symptoms? Symptoms of this condition include:  Shortness of breath with activity, such as when climbing stairs.  Persistent cough.  Swelling of the feet, ankles, legs, or abdomen.  Unexplained weight gain.  Difficulty  breathing when lying flat (orthopnea).  Waking from sleep because of the need to sit up and get more air.  Rapid heartbeat.  Fatigue and loss of energy.  Feeling light-headed, dizzy, or close to fainting.  Loss of appetite.  Nausea.  Increased urination during the night (nocturia).  Confusion.  How is this diagnosed? This condition is diagnosed based on:  Medical history, symptoms, and a physical exam.  Diagnostic tests, which may include: ? Echocardiogram. ? Electrocardiogram (ECG). ? Chest X-ray. ? Blood tests. ? Exercise stress test. ? Radionuclide scans. ? Cardiac catheterization and angiogram.  How is this treated? Treatment for this condition is aimed at managing the symptoms of heart failure. Medicines, behavioral changes, or other treatments may be necessary to treat heart failure. Medicines These may include:  Angiotensin-converting enzyme (ACE) inhibitors. This type of medicine blocks the effects of a blood protein called angiotensin-converting enzyme. ACE inhibitors relax (dilate) the blood vessels and help to lower blood pressure.  Angiotensin receptor blockers (ARBs). This type of medicine blocks the actions of a blood protein called angiotensin. ARBs dilate the blood vessels and help to lower blood pressure.  Water pills (diuretics). Diuretics cause the kidneys to remove salt and water from the blood. The extra fluid is removed through urination, leaving a lower volume of blood that the heart has to pump.  Beta blockers. These improve heart muscle strength and they prevent the heart from beating too quickly.  Digoxin. This increases the force of the heartbeat.  Healthy behavior changes These may include:  Reaching and maintaining a healthy weight.  Stopping smoking or chewing tobacco.  Eating heart-healthy foods.  Limiting or avoiding alcohol.  Stopping use of street drugs (illegal drugs).  Physical activity.  Other treatments These may  include:  Surgery to open blocked coronary arteries or repair damaged heart valves.  Placement of a biventricular pacemaker to improve heart muscle function (cardiac resynchronization therapy). This device paces both the right ventricle and left ventricle.  Placement of a device to treat serious abnormal heart rhythms (implantable cardioverter defibrillator, or ICD).  Placement of a device to improve the pumping ability of the heart (left ventricular assist device, or LVAD).  Heart transplant. This can cure heart failure, and it is considered for certain patients who do not improve with other therapies.  Follow these instructions at home: Medicines  Take over-the-counter and prescription medicines only as told by your health care provider. Medicines are important in reducing the workload of your heart, slowing the progression of heart failure, and improving your symptoms. ? Do not stop taking your medicine unless your health care provider told you to do that. ? Do not skip any dose of medicine. ? Refill your prescriptions before you run out of medicine. You need your medicines every day. Eating and drinking   Eat heart-healthy foods. Talk with a dietitian to make an eating plan that is right for you. ? Choose foods that contain no trans fat and are low in saturated fat and cholesterol. Healthy choices include fresh or frozen fruits and vegetables, fish, lean meats, legumes,  fat-free or low-fat dairy products, and whole-grain or high-fiber foods. ? Limit salt (sodium) if directed by your health care provider. Sodium restriction may reduce symptoms of heart failure. Ask a dietitian to recommend heart-healthy seasonings. ? Use healthy cooking methods instead of frying. Healthy methods include roasting, grilling, broiling, baking, poaching, steaming, and stir-frying.  Limit your fluid intake if directed by your health care provider. Fluid restriction may reduce symptoms of heart  failure. Lifestyle  Stop smoking or using chewing tobacco. Nicotine and tobacco can damage your heart and your blood vessels. Do not use nicotine gum or patches before talking to your health care provider.  Limit alcohol intake to no more than 1 drink per day for non-pregnant women and 2 drinks per day for men. One drink equals 12 oz of beer, 5 oz of wine, or 1 oz of hard liquor. ? Drinking more than that is harmful to your heart. Tell your health care provider if you drink alcohol several times a week. ? Talk with your health care provider about whether any level of alcohol use is safe for you. ? If your heart has already been damaged by alcohol or you have severe heart failure, drinking alcohol should be stopped completely.  Stop use of illegal drugs.  Lose weight if directed by your health care provider. Weight loss may reduce symptoms of heart failure.  Do moderate physical activity if directed by your health care provider. People who are elderly and people with severe heart failure should consult with a health care provider for physical activity recommendations. Monitor important information  Weigh yourself every day. Keeping track of your weight daily helps you to notice excess fluid sooner. ? Weigh yourself every morning after you urinate and before you eat breakfast. ? Wear the same amount of clothing each time you weigh yourself. ? Record your daily weight. Provide your health care provider with your weight record.  Monitor and record your blood pressure as told by your health care provider.  Check your pulse as told by your health care provider. Dealing with extreme temperatures  If the weather is extremely hot: ? Avoid vigorous physical activity. ? Use air conditioning or fans or seek a cooler location. ? Avoid caffeine and alcohol. ? Wear loose-fitting, lightweight, and light-colored clothing.  If the weather is extremely cold: ? Avoid vigorous physical  activity. ? Layer your clothes. ? Wear mittens or gloves, a hat, and a scarf when you go outside. ? Avoid alcohol. General instructions  Manage other health conditions such as hypertension, diabetes, thyroid disease, or abnormal heart rhythms as told by your health care provider.  Learn to manage stress. If you need help to do this, ask your health care provider.  Plan rest periods when fatigued.  Get ongoing education and support as needed.  Participate in or seek rehabilitation as needed to maintain or improve independence and quality of life.  Stay up to date with immunizations. Keeping current on pneumococcal and influenza immunizations is especially important to prevent respiratory infections.  Keep all follow-up visits as told by your health care provider. This is important. Contact a health care provider if:  You have a rapid weight gain.  You have increasing shortness of breath that is unusual for you.  You are unable to participate in your usual physical activities.  You tire easily.  You cough more than normal, especially with physical activity.  You have any swelling or more swelling in areas such as your hands, feet, ankles,  or abdomen.  You are unable to sleep because it is hard to breathe.  You feel like your heart is beating quickly (palpitations).  You become dizzy or light-headed when you stand up. Get help right away if:  You have difficulty breathing.  You notice or your family notices a change in your awareness, such as having trouble staying awake or having difficulty with concentration.  You have pain or discomfort in your chest.  You have an episode of fainting (syncope). This information is not intended to replace advice given to you by your health care provider. Make sure you discuss any questions you have with your health care provider. Document Released: 08/15/2005 Document Revised: 04/19/2016 Document Reviewed: 03/09/2016 Elsevier  Interactive Patient Education  2017 [employer].

## 2017-03-04 LAB — PRO B NATRIURETIC PEPTIDE: NT-Pro BNP: 634 pg/mL — ABNORMAL HIGH (ref 0–376)

## 2017-03-07 LAB — COMPREHENSIVE METABOLIC PANEL WITH GFR
ALT: 22 IU/L (ref 0–44)
AST: 23 IU/L (ref 0–40)
Albumin/Globulin Ratio: 1.7 (ref 1.2–2.2)
Albumin: 4.2 g/dL (ref 3.6–4.8)
Alkaline Phosphatase: 91 IU/L (ref 39–117)
BUN/Creatinine Ratio: 11 (ref 10–24)
BUN: 14 mg/dL (ref 8–27)
Bilirubin Total: 0.8 mg/dL (ref 0.0–1.2)
CO2: 24 mmol/L (ref 20–29)
Calcium: 9.1 mg/dL (ref 8.6–10.2)
Chloride: 102 mmol/L (ref 96–106)
Creatinine, Ser: 1.32 mg/dL — ABNORMAL HIGH (ref 0.76–1.27)
GFR calc Af Amer: 64 mL/min/1.73
GFR calc non Af Amer: 55 mL/min/1.73 — ABNORMAL LOW
Globulin, Total: 2.5 g/dL (ref 1.5–4.5)
Glucose: 92 mg/dL (ref 65–99)
Potassium: 3.8 mmol/L (ref 3.5–5.2)
Sodium: 140 mmol/L (ref 134–144)
Total Protein: 6.7 g/dL (ref 6.0–8.5)

## 2017-03-07 LAB — TSH: TSH: 1.1 u[IU]/mL (ref 0.450–4.500)

## 2017-03-07 LAB — AMIODARONE LEVEL
Amiodarone, Serum: 1 ug/mL (ref 1.0–2.5)
Noramiodarone,S: 1 ug/mL (ref 1.0–2.5)

## 2017-03-21 ENCOUNTER — Other Ambulatory Visit: Payer: Self-pay

## 2017-03-23 ENCOUNTER — Ambulatory Visit (HOSPITAL_COMMUNITY)
Admission: RE | Admit: 2017-03-23 | Discharge: 2017-03-23 | Disposition: A | Discharge status: Home / Self Care | Payer: Medicare Other | Source: Ambulatory Visit | Attending: Cardiology | Admitting: Cardiology

## 2017-03-23 DIAGNOSIS — R29898 Other symptoms and signs involving the musculoskeletal system: Secondary | ICD-10-CM

## 2017-03-23 DIAGNOSIS — I5032 Chronic diastolic (congestive) heart failure: Secondary | ICD-10-CM

## 2017-03-23 DIAGNOSIS — R9439 Abnormal result of other cardiovascular function study: Secondary | ICD-10-CM | POA: Insufficient documentation

## 2017-03-23 DIAGNOSIS — I639 Cerebral infarction, unspecified: Secondary | ICD-10-CM | POA: Diagnosis not present

## 2017-03-23 DIAGNOSIS — I739 Peripheral vascular disease, unspecified: Secondary | ICD-10-CM | POA: Insufficient documentation

## 2017-03-23 DIAGNOSIS — I6523 Occlusion and stenosis of bilateral carotid arteries: Secondary | ICD-10-CM | POA: Insufficient documentation

## 2017-04-02 ENCOUNTER — Emergency Department (HOSPITAL_COMMUNITY): Payer: Medicare Other

## 2017-04-02 ENCOUNTER — Observation Stay (HOSPITAL_COMMUNITY)
Admission: EM | Admit: 2017-04-02 | Discharge: 2017-04-03 | Disposition: A | Discharge status: Home / Self Care | Payer: Medicare Other | Attending: Internal Medicine | Admitting: Internal Medicine

## 2017-04-02 ENCOUNTER — Encounter (HOSPITAL_COMMUNITY): Payer: Self-pay | Admitting: Emergency Medicine

## 2017-04-02 ENCOUNTER — Other Ambulatory Visit: Payer: Self-pay

## 2017-04-02 DIAGNOSIS — I5032 Chronic diastolic (congestive) heart failure: Secondary | ICD-10-CM | POA: Diagnosis present

## 2017-04-02 DIAGNOSIS — Z87891 Personal history of nicotine dependence: Secondary | ICD-10-CM | POA: Insufficient documentation

## 2017-04-02 DIAGNOSIS — I4891 Unspecified atrial fibrillation: Secondary | ICD-10-CM | POA: Insufficient documentation

## 2017-04-02 DIAGNOSIS — I2510 Atherosclerotic heart disease of native coronary artery without angina pectoris: Secondary | ICD-10-CM | POA: Diagnosis not present

## 2017-04-02 DIAGNOSIS — I4819 Other persistent atrial fibrillation: Secondary | ICD-10-CM | POA: Diagnosis present

## 2017-04-02 DIAGNOSIS — I639 Cerebral infarction, unspecified: Secondary | ICD-10-CM | POA: Diagnosis present

## 2017-04-02 DIAGNOSIS — Z6832 Body mass index (BMI) 32.0-32.9, adult: Secondary | ICD-10-CM | POA: Diagnosis present

## 2017-04-02 DIAGNOSIS — Z9049 Acquired absence of other specified parts of digestive tract: Secondary | ICD-10-CM | POA: Insufficient documentation

## 2017-04-02 DIAGNOSIS — E669 Obesity, unspecified: Secondary | ICD-10-CM | POA: Diagnosis present

## 2017-04-02 DIAGNOSIS — Z7982 Long term (current) use of aspirin: Secondary | ICD-10-CM | POA: Insufficient documentation

## 2017-04-02 DIAGNOSIS — I110 Hypertensive heart disease with heart failure: Secondary | ICD-10-CM | POA: Diagnosis not present

## 2017-04-02 DIAGNOSIS — Z951 Presence of aortocoronary bypass graft: Secondary | ICD-10-CM | POA: Diagnosis not present

## 2017-04-02 DIAGNOSIS — Z8673 Personal history of transient ischemic attack (TIA), and cerebral infarction without residual deficits: Secondary | ICD-10-CM | POA: Insufficient documentation

## 2017-04-02 DIAGNOSIS — E785 Hyperlipidemia, unspecified: Secondary | ICD-10-CM | POA: Insufficient documentation

## 2017-04-02 DIAGNOSIS — I480 Paroxysmal atrial fibrillation: Secondary | ICD-10-CM | POA: Diagnosis present

## 2017-04-02 DIAGNOSIS — I119 Hypertensive heart disease without heart failure: Secondary | ICD-10-CM | POA: Diagnosis present

## 2017-04-02 DIAGNOSIS — Z6835 Body mass index (BMI) 35.0-35.9, adult: Secondary | ICD-10-CM | POA: Insufficient documentation

## 2017-04-02 DIAGNOSIS — I252 Old myocardial infarction: Secondary | ICD-10-CM | POA: Insufficient documentation

## 2017-04-02 DIAGNOSIS — R079 Chest pain, unspecified: Secondary | ICD-10-CM | POA: Diagnosis not present

## 2017-04-02 DIAGNOSIS — Z79899 Other long term (current) drug therapy: Secondary | ICD-10-CM | POA: Diagnosis not present

## 2017-04-02 DIAGNOSIS — R072 Precordial pain: Secondary | ICD-10-CM | POA: Diagnosis not present

## 2017-04-02 DIAGNOSIS — E6609 Other obesity due to excess calories: Secondary | ICD-10-CM | POA: Diagnosis present

## 2017-04-02 DIAGNOSIS — E66811 Obesity, class 1: Secondary | ICD-10-CM | POA: Diagnosis present

## 2017-04-02 HISTORY — DX: Obesity, unspecified: E66.9

## 2017-04-02 HISTORY — DX: Chest pain, unspecified: R07.9

## 2017-04-02 HISTORY — DX: Unspecified atrial fibrillation: I48.91

## 2017-04-02 LAB — BASIC METABOLIC PANEL
ANION GAP: 10 (ref 5–15)
BUN: 15 mg/dL (ref 6–20)
CALCIUM: 9 mg/dL (ref 8.9–10.3)
CO2: 24 mmol/L (ref 22–32)
CREATININE: 1.24 mg/dL (ref 0.61–1.24)
Chloride: 102 mmol/L (ref 101–111)
GFR, EST NON AFRICAN AMERICAN: 58 mL/min — AB (ref >60)
Glucose, Bld: 100 mg/dL — ABNORMAL HIGH (ref 65–99)
Potassium: 4.3 mmol/L (ref 3.5–5.1)
SODIUM: 136 mmol/L (ref 135–145)

## 2017-04-02 LAB — TSH: TSH: 1.724 u[IU]/mL (ref 0.350–4.500)

## 2017-04-02 LAB — I-STAT TROPONIN, ED: TROPONIN I, POC: 0.01 ng/mL (ref 0.00–0.08)

## 2017-04-02 LAB — LIPID PANEL
Cholesterol: 184 mg/dL (ref 0–200)
HDL: 31 mg/dL — ABNORMAL LOW (ref >40)
LDL Cholesterol: 118 mg/dL — ABNORMAL HIGH (ref 0–99)
Total CHOL/HDL Ratio: 5.9 RATIO
Triglycerides: 176 mg/dL — ABNORMAL HIGH (ref <150)
VLDL: 35 mg/dL (ref 0–40)

## 2017-04-02 LAB — TROPONIN I: Troponin I: <0.03 ng/mL (ref <0.03)

## 2017-04-02 LAB — CBC
HCT: 42.8 % (ref 39.0–52.0)
HEMOGLOBIN: 15.3 g/dL (ref 13.0–17.0)
MCH: 31.1 pg (ref 26.0–34.0)
MCHC: 35.7 g/dL (ref 30.0–36.0)
MCV: 87 fL (ref 78.0–100.0)
PLATELETS: 176 10*3/uL (ref 150–400)
RBC: 4.92 MIL/uL (ref 4.22–5.81)
RDW: 13.5 % (ref 11.5–15.5)
WBC: 6.5 10*3/uL (ref 4.0–10.5)

## 2017-04-02 MED ORDER — GI COCKTAIL ~~LOC~~: 30.0000 mL | Freq: Four times a day (QID) | ORAL | Status: DC | PRN

## 2017-04-02 MED ORDER — MORPHINE SULFATE (PF) 4 MG/ML IV SOLN: 2.0000 mg | INTRAVENOUS | Status: DC | PRN

## 2017-04-02 MED ORDER — ASPIRIN EC 325 MG PO TBEC
325.0000 mg | DELAYED_RELEASE_TABLET | Freq: Every day | ORAL | Status: DC
  Administered 2017-04-02 – 2017-04-03 (×2): 325 mg via ORAL
  Filled 2017-04-02 (×2): qty 1

## 2017-04-02 MED ORDER — FUROSEMIDE 20 MG PO TABS
40.0000 mg | ORAL_TABLET | Freq: Two times a day (BID) | ORAL | Status: DC
  Administered 2017-04-02 – 2017-04-03 (×3): 40 mg via ORAL
  Filled 2017-04-02 (×3): qty 2

## 2017-04-02 MED ORDER — CLONIDINE HCL 0.1 MG PO TABS
0.1000 mg | ORAL_TABLET | Freq: Every day | ORAL | Status: DC
Start: 2017-04-02 — End: 2017-04-03
  Administered 2017-04-02 – 2017-04-03 (×2): 0.1 mg via ORAL
  Filled 2017-04-02 (×2): qty 1

## 2017-04-02 MED ORDER — NITROGLYCERIN 0.4 MG SL SUBL: 0.4000 mg | SUBLINGUAL_TABLET | SUBLINGUAL | Status: DC | PRN

## 2017-04-02 MED ORDER — AMIODARONE HCL 200 MG PO TABS
200.0000 mg | ORAL_TABLET | Freq: Every day | ORAL | Status: DC
  Filled 2017-04-02 (×2): qty 1

## 2017-04-02 MED ORDER — LEVOTHYROXINE SODIUM 150 MCG PO TABS
150.0000 ug | ORAL_TABLET | Freq: Every day | ORAL | Status: DC
  Administered 2017-04-02 – 2017-04-03 (×2): 150 ug via ORAL
  Filled 2017-04-02: qty 2
  Filled 2017-04-02 (×2): qty 1

## 2017-04-02 MED ORDER — APIXABAN 5 MG PO TABS
5.0000 mg | ORAL_TABLET | Freq: Two times a day (BID) | ORAL | Status: DC
  Administered 2017-04-02 – 2017-04-03 (×3): 5 mg via ORAL
  Filled 2017-04-02 (×3): qty 1

## 2017-04-02 MED ORDER — IRBESARTAN 75 MG PO TABS
37.5000 mg | ORAL_TABLET | Freq: Every day | ORAL | Status: DC
Start: 2017-04-02 — End: 2017-04-03
  Administered 2017-04-02 – 2017-04-03 (×2): 37.5 mg via ORAL
  Filled 2017-04-02 (×2): qty 0.5

## 2017-04-02 MED ORDER — PRAVASTATIN SODIUM 40 MG PO TABS
80.0000 mg | ORAL_TABLET | Freq: Every day | ORAL | Status: DC
  Administered 2017-04-02: 80 mg via ORAL
  Filled 2017-04-02: qty 2

## 2017-04-02 MED ORDER — CARVEDILOL 3.125 MG PO TABS
3.1250 mg | ORAL_TABLET | Freq: Every day | ORAL | Status: DC
  Filled 2017-04-02 (×2): qty 1

## 2017-04-02 MED ORDER — ACETAMINOPHEN 325 MG PO TABS: 650.0000 mg | ORAL_TABLET | ORAL | Status: DC | PRN

## 2017-04-02 MED ORDER — ZOLPIDEM TARTRATE 5 MG PO TABS
5.0000 mg | ORAL_TABLET | Freq: Every evening | ORAL | Status: DC | PRN
  Administered 2017-04-02: 5 mg via ORAL
  Filled 2017-04-02: qty 1

## 2017-04-02 MED ORDER — ONDANSETRON HCL 4 MG/2ML IJ SOLN
4.0000 mg | Freq: Four times a day (QID) | INTRAMUSCULAR | Status: DC | PRN
Start: 2017-04-02 — End: 2017-04-03

## 2017-04-02 MED ORDER — SODIUM CHLORIDE 0.9 % IV SOLN
INTRAVENOUS | Status: AC
  Administered 2017-04-02: 11:00:00 via INTRAVENOUS

## 2017-04-02 NOTE — ED Triage Notes (Signed)
Patient arrives with complaint of left chest discomfort. States onset about 1 hour ago. History of CABG in 2001. Denies any unusual sensations throughout day yesterday. Last saw cardiologist about 3 weeks ago and was told that everything appeared to be doing well.

## 2017-04-02 NOTE — ED Provider Notes (Signed)
[facility] DEPT Provider Note   CSN: [id number] Arrival date & time: 04/02/17  0139     History   Chief Complaint Chief Complaint  Patient presents with  . Chest Pain    HPI John Kemp is a 71 y.o. male.  The history is provided by the patient.  Chest Pain   This is a new problem. Episode onset: 1 hour prior to arrival. The problem occurs constantly. The problem has been resolved. The pain is present in the substernal region. The pain is moderate. The quality of the pain is described as pressure-like. The pain does not radiate. Associated symptoms include diaphoresis. Pertinent negatives include no shortness of breath and no vomiting. Treatments tried: ASA/eliquis. The treatment provided mild relief.  His past medical history is significant for CAD.  pt with known h/o CAD s/p CABG who presents with CP He reports episode of CP that occurred tonight while in bed He does not usually get CP He is now improved Wife reports he was diaphoretic and "clammy" as well No other acute complaints  Past Medical History:  Diagnosis Date  . Heart attack ([place])   . Hyperlipidemia   . Hypertension   . Stroke [facility])     Patient Active Problem List   Diagnosis Date Noted  . Stroke ([place]) 03/03/2017  . Leg fatigue 03/03/2017  . Hx of CABG 03/02/2017  . Anticoagulated 01/21/2015  . CAD in native artery 01/21/2015  . Chronic diastolic heart failure ([place]) 01/21/2015  . Hypertensive heart disease 01/21/2015  . On amiodarone therapy 01/21/2015  . Typical atrial flutter ([place]) 01/21/2015  . Dyspnea 12/31/2014    Past Surgical History:  Procedure Laterality Date  . APPENDECTOMY  1956  . CHOLECYSTECTOMY  2006  . CORONARY ARTERY BYPASS GRAFT  2001  . TONSILLECTOMY  1975       Home Medications    Prior to Admission medications   Medication Sig Start Date End Date Taking? Authorizing Provider  amiodarone (PACERONE) 200 MG tablet Take 1 tablet (200 mg total) by mouth daily.  03/03/17   [name], MD  apixaban (ELIQUIS) 5 MG TABS tablet Take 5 mg by mouth 2 (two) times daily.    [provider]  aspirin EC 81 MG tablet Take 1 tablet (81 mg total) by mouth daily. 03/03/17   [name], MD  carvedilol (COREG) 3.125 MG tablet Take 3.125 mg by mouth daily.     [provider]  cloNIDine (CATAPRES) 0.1 MG tablet Take 0.1 mg by mouth daily.    [provider]  furosemide (LASIX) 40 MG tablet Take 40 mg by mouth 2 (two) times daily.    [provider]  levothyroxine (SYNTHROID, LEVOTHROID) 125 MCG tablet Take 150 mcg by mouth daily.    [provider]  pravastatin (PRAVACHOL) 80 MG tablet Take 80 mg by mouth daily.    [provider]  valsartan (DIOVAN) 320 MG tablet Take 320 mg by mouth daily.    [provider]  zolpidem (AMBIEN) 5 MG tablet Take 5 mg by mouth at bedtime as needed for sleep.    [provider]    Family History Family History  Problem Relation Age of Onset  . Heart disease Father   . Breast cancer Mother     Social History Social History  Substance Use Topics  . Smoking status: Former Smoker    Packs/day: 1.00    Years: 15.00    Types: Cigarettes  Quit date: 08/29/1978  . Smokeless tobacco: Never Used  . Alcohol use No     Allergies   Prednisone   Review of Systems Review of Systems  Constitutional: Positive for diaphoresis.  Respiratory: Negative for shortness of breath.   Cardiovascular: Positive for chest pain.  Gastrointestinal: Negative for vomiting.  All other systems reviewed and are negative.    Physical Exam Updated Vital Signs BP 131/65   Pulse (!) 50   Temp 98.2 F (36.8 C) (Oral)   Resp (!) 21   Ht 1.778 m (5\' 10" )   Wt 115.7 kg (255 lb)   SpO2 95%   BMI 36.59 kg/m   Physical Exam CONSTITUTIONAL: Well developed/well nourished HEAD: Normocephalic/atraumatic EYES: EOMI ENMT: Mucous membranes moist NECK: supple no meningeal  signs SPINE/BACK:entire spine nontender CV: S1/S2 noted, no murmurs/rubs/gallops noted LUNGS: Lungs are clear to auscultation bilaterally, no apparent distress ABDOMEN: soft, nontender,obese GU:no cva tenderness NEURO: Pt is awake/alert/appropriate, moves all extremitiesx4.  No facial droop.   EXTREMITIES: pulses normal/equal, full ROM SKIN: warm, color normal, well healed midline sternotomy scar PSYCH: no abnormalities of mood noted, alert and oriented to situation   ED Treatments / Results  Labs (all labs ordered are listed, but only abnormal results are displayed) Labs Reviewed  BASIC METABOLIC PANEL - Abnormal; Notable for the following:       Result Value   Glucose, Bld 100 (*)    GFR calc non Af Amer 58 (*)    All other components within normal limits  CBC  I-STAT TROPONIN, ED    EKG  EKG Interpretation None       Radiology Dg Chest 2 View  Result Date: 04/02/2017 CLINICAL DATA:  71 year old male with chest pain. EXAM: CHEST  2 VIEW COMPARISON:  None. FINDINGS: There is mild diffuse interstitial coarsening. Left perihilar streaky densities may represent atelectatic changes or mild vascular congestion. Developing infiltrate is less likely. Clinical correlation is recommended. There is no focal consolidation. No pleural effusion or pneumothorax. Top-normal cardiac size. Median sternotomy wires. No acute osseous pathology. Right upper quadrant cholecystectomy clips. IMPRESSION: Left perihilar interstitial and streaky densities, likely atelectatic changes and less likely mild vascular congestion. Developing infiltrate is less likely. Clinical correlation is recommended. Electronically Signed   By: Anner Crete M.D.   On: 04/02/2017 03:06    Procedures Procedures   Medications Ordered in ED Medications - No data to display   Initial Impression / Assessment and Plan / ED Course  I have reviewed the triage vital signs and the nursing notes.  Pertinent labs & imaging  results that were available during my care of the patient were reviewed by me and considered in my medical decision making (see chart for details).     5:39 AM Pt awake/alert, reporting CP earlier tonight that is resolved He has h/o CAD and usually does not get CP.  No pleuritic CP to suggest PE No pain radiating to his back Will need admission Pt agreeable He reports his cardiologist just moved to [place] 7:15 AM Pt stable He is sleeping at this time Will admit for ACS rule out D/w triad for admission  Final Clinical Impressions(s) / ED Diagnoses   Final diagnoses:  Chest pain, rule out acute myocardial infarction    New Prescriptions New Prescriptions   No medications on file     Ripley Fraise, MD 04/02/17 0715

## 2017-04-02 NOTE — ED Notes (Addendum)
Manuela Schwartz (Spouse) Home [phone] Cell [phone]

## 2017-04-02 NOTE — Plan of Care (Signed)
Problem: Pain Managment: Goal: General experience of comfort will improve Outcome: Progressing Denies any further chest pain   Problem: Tissue Perfusion: Goal: Risk factors for ineffective tissue perfusion will decrease Outcome: Progressing Oxygen remains in the 90's on room air   Problem: Activity: Goal: Risk for activity intolerance will decrease Outcome: Progressing Denies shortness of breath or chest pain with activity

## 2017-04-02 NOTE — Progress Notes (Signed)
Patient without complaint since admission, walking in the hallways independently.  Heart rate remains between 40's and 60's, no c/o chest pain, dyspnea or other.  Family members in and out during this shift.

## 2017-04-02 NOTE — Progress Notes (Signed)
Patient admitted to [facility] from ER with diagnosis of chest pain, no chest pain currently.  VSS,  HR in the 50's, Coreg and Amiodarone held.  Patient without complaint, daughter at bedside.

## 2017-04-02 NOTE — H&P (Signed)
History and Physical    John Kemp OXB:[id number] DOB: 07-Jun-1946 DOA: 04/02/2017  PCP: [name], MD Patient coming from: home  Chief Complaint: chest pain  HPI: John Kemp is a very pleasant 71 y.o. male with medical history significant for CAD status post CABG remotely. A. fib, chronic diastolic heart failure, stroke,  former smoker, obesity presents to the emergency Department chief complaint of chest pain. Patient being admitted for chest pain rule out.  Information is obtained from the patient and the chart. Reports this morning he is lying in bed he developed sudden chest "pressure". He states this sensation was located on the right radiated to the left. Associated symptoms include "brief clamminess" but denies diaphoresis shortness of breath headache dizziness syncope or near-syncope. He denies any worsening lower extremity edema abdominal pain nausea vomiting. He took aspirin and his request with little relief. He states the pain lasted almost 2 hours. He denies any fever chills cough diarrhea constipation melena or bright red blood per rectum. He denies dysuria hematuria frequency or urgency. He states he does not usually get chest pain.    ED Course: In the emergency department he's afebrile hemodynamically stable and not hypoxic. At the time of admission his chest pain free   Review of Systems: As per HPI otherwise all other systems reviewed and are negative.   Ambulatory Status: He ambulates independently and is independent with ADLs. Of note 3 months ago he was diagnosed with a stroke as an incidental finding since that time has complained of mildly unsteady gait  Past Medical History:  Diagnosis Date  . A-fib ([place])   . Heart attack ([place])   . Hyperlipidemia   . Hypertension   . Stroke [facility])     Past Surgical History:  Procedure Laterality Date  . APPENDECTOMY  1956  . CHOLECYSTECTOMY  2006  . CORONARY ARTERY BYPASS GRAFT  2001  . TONSILLECTOMY  1975      Social History   Social History  . Marital status: Married    Spouse name: N/A  . Number of children: N/A  . Years of education: N/A   Occupational History  . Retired    Social History Main Topics  . Smoking status: Former Smoker    Packs/day: 1.00    Years: 15.00    Types: Cigarettes    Quit date: 08/29/1978  . Smokeless tobacco: Never Used  . Alcohol use No  . Drug use: No  . Sexual activity: Not on file   Other Topics Concern  . Not on file   Social History Narrative  . No narrative on file  Lives at home with his wife is a retired Hotel manager with a [place]  Allergies  Allergen Reactions  . Prednisone     "makes me feel terrible"    Family History  Problem Relation Age of Onset  . Heart disease Father   . Breast cancer Mother     Prior to Admission medications   Medication Sig Start Date End Date Taking? Authorizing Provider  amiodarone (PACERONE) 200 MG tablet Take 1 tablet (200 mg total) by mouth daily. 03/03/17   [name], MD  apixaban (ELIQUIS) 5 MG TABS tablet Take 5 mg by mouth 2 (two) times daily.    [provider]  aspirin EC 81 MG tablet Take 1 tablet (81 mg total) by mouth daily. 03/03/17   [name], MD  carvedilol (COREG) 3.125 MG tablet Take 3.125 mg by mouth daily.  [provider]  cloNIDine (CATAPRES) 0.1 MG tablet Take 0.1 mg by mouth daily.    [provider]  furosemide (LASIX) 40 MG tablet Take 40 mg by mouth 2 (two) times daily.    [provider]  levothyroxine (SYNTHROID, LEVOTHROID) 125 MCG tablet Take 150 mcg by mouth daily.    [provider]  pravastatin (PRAVACHOL) 80 MG tablet Take 80 mg by mouth daily.    [provider]  valsartan (DIOVAN) 320 MG tablet Take 320 mg by mouth daily.    [provider]  zolpidem (AMBIEN) 5 MG tablet Take 5 mg by mouth at bedtime as needed for sleep.    [provider]    Physical Exam: Vitals:   04/02/17  0500 04/02/17 0515 04/02/17 0530 04/02/17 0700  BP: 133/65 126/63 131/65 136/70  Pulse: (!) 52 (!) 50 (!) 50 (!) 49  Resp: (!) 24 18 (!) 21 19  Temp:      TempSrc:      SpO2: 96% 95% 95% 95%  Weight:      Height:         General:  Appears calm and comfortable, obese in no acute distress Eyes:  PERRL, EOMI, normal lids, iris ENT:  grossly normal hearing, lips & tongue, mucous membranes of his mouth are pink but dry Neck:  no LAD, masses or thyromegaly Cardiovascular:  RRR, no m/r/g. Trace LE edema.  Respiratory:  CTA bilaterally but somewhat distant., no w/r/r. Normal respiratory effort. Abdomen:  soft, ntnd, obese, positive bowel sounds throughout no guarding or rebounding Skin:  no rash or induration seen on limited exam Musculoskeletal:  grossly normal tone BUE/BLE, good ROM, no bony abnormality Psychiatric:  grossly normal mood and affect, speech fluent and appropriate, AOx3 Neurologic:  CN 2-12 grossly intact, moves all extremities in coordinated fashion, sensation intact  Labs on Admission: I have personally reviewed following labs and imaging studies  CBC:  Recent Labs Lab 04/02/17 0224  WBC 6.5  HGB 15.3  HCT 42.8  MCV 87.0  PLT 287   Basic Metabolic Panel:  Recent Labs Lab 04/02/17 0224  NA 136  K 4.3  CL 102  CO2 24  GLUCOSE 100*  BUN 15  CREATININE 1.24  CALCIUM 9.0   GFR: Estimated Creatinine Clearance: 72.7 mL/min (by C-G formula based on SCr of 1.24 mg/dL). Liver Function Tests: No results for input(s): AST, ALT, ALKPHOS, BILITOT, PROT, ALBUMIN in the last 168 hours. No results for input(s): LIPASE, AMYLASE in the last 168 hours. No results for input(s): AMMONIA in the last 168 hours. Coagulation Profile: No results for input(s): INR, PROTIME in the last 168 hours. Cardiac Enzymes: No results for input(s): CKTOTAL, CKMB, CKMBINDEX, TROPONINI in the last 168 hours. BNP (last 3 results)  Recent Labs  03/03/17 1105  PROBNP 634*    HbA1C: No results for input(s): HGBA1C in the last 72 hours. CBG: No results for input(s): GLUCAP in the last 168 hours. Lipid Profile: No results for input(s): CHOL, HDL, LDLCALC, TRIG, CHOLHDL, LDLDIRECT in the last 72 hours. Thyroid Function Tests: No results for input(s): TSH, T4TOTAL, FREET4, T3FREE, THYROIDAB in the last 72 hours. Anemia Panel: No results for input(s): VITAMINB12, FOLATE, FERRITIN, TIBC, IRON, RETICCTPCT in the last 72 hours. Urine analysis: No results found for: COLORURINE, APPEARANCEUR, LABSPEC, PHURINE, GLUCOSEU, HGBUR, BILIRUBINUR, KETONESUR, PROTEINUR, UROBILINOGEN, NITRITE, LEUKOCYTESUR  Creatinine Clearance: Estimated Creatinine Clearance: 72.7 mL/min (by C-G formula based on SCr of 1.24 mg/dL).  Sepsis Labs: @  LABRCNTIP(procalcitonin:4,lacticidven:4) )No results found for this or any previous visit (from the past 240 hour(s)).   Radiological Exams on Admission: Dg Chest 2 View  Result Date: 04/02/2017 CLINICAL DATA:  71 year old male with chest pain. EXAM: CHEST  2 VIEW COMPARISON:  None. FINDINGS: There is mild diffuse interstitial coarsening. Left perihilar streaky densities may represent atelectatic changes or mild vascular congestion. Developing infiltrate is less likely. Clinical correlation is recommended. There is no focal consolidation. No pleural effusion or pneumothorax. Top-normal cardiac size. Median sternotomy wires. No acute osseous pathology. Right upper quadrant cholecystectomy clips. IMPRESSION: Left perihilar interstitial and streaky densities, likely atelectatic changes and less likely mild vascular congestion. Developing infiltrate is less likely. Clinical correlation is recommended. Electronically Signed   By: Anner Crete M.D.   On: 04/02/2017 03:06    EKG: Independently reviewed. Sinus rhythm Nonspecific intraventricular conduction delay Probable inferior infarct, old Abnormal lateral Q waves   Assessment/Plan Principal  Problem:   Chest pain Active Problems:   Chronic diastolic heart failure (HCC)   Hypertensive heart disease   Hx of CABG   Stroke (HCC)   A-fib (HCC)   Obesity   #1. Chest pain. Some typical and atypical features. Heart score 5. Patient with known CAD status post CABG remotely. EKG as noted above. Initial troponin negative. Chest x-ray reveals left perihilar interstitial and streaky densities likely atelectatic changes and less likely mild vascular congestion. Patient is pain-free on admission. He also reports having had a stress test 2017. His cardiologist is out of aspirin but has recently moved to [place]. Chart review indicates he saw his cardiologist weeks ago. -Admit to telemetry -Cycle troponin -Lipid panel -Serial EKG -Echocardiogram -Continue aspirin and statin -If rules out and remains pain-free consider outpatient follow-up with cardiology  2. Chronic diastolic heart failure. Appears compensated. Does have trace lower extremity edema. Chart review indicates there may be some compliance issues with his medications related to early dementia. Home medications include Coreg, Lasix, losartan, amiodarone Chest x-ray as noted above. -Continue home meds -Daily weights -Intake and output -Obtain a 2-D echo  #3. A. fib. Currently sinus rhythm with a heart rate of 50. Chadvasc score 4. Home medications include Eliquis -Continue home meds -Obtain a TSH  #4. Hypertension. Blood pressure high end of normal. Home medications include losartan, Coreg, clonidine Lasix -Continue home meds -Monitor  5. History of stroke. Appears stable at baseline  #6. Obesity. BMI 36.7 -nutritional consult    DVT prophylaxis: eliquis Code Status: full  Family Communication: none present  Disposition Plan: home hopefully in am  Consults called: none  Admission status: obs    Dyanne Carrel M MD [facility]  If 7PM-7AM, please contact night-coverage [email or web address] Password  [email or web address]  04/02/2017, 7:57 AM

## 2017-04-03 ENCOUNTER — Other Ambulatory Visit: Payer: Self-pay

## 2017-04-03 ENCOUNTER — Observation Stay (HOSPITAL_BASED_OUTPATIENT_CLINIC_OR_DEPARTMENT_OTHER): Payer: Medicare Other

## 2017-04-03 DIAGNOSIS — E669 Obesity, unspecified: Secondary | ICD-10-CM | POA: Diagnosis not present

## 2017-04-03 DIAGNOSIS — R079 Chest pain, unspecified: Secondary | ICD-10-CM

## 2017-04-03 DIAGNOSIS — I110 Hypertensive heart disease with heart failure: Secondary | ICD-10-CM | POA: Diagnosis not present

## 2017-04-03 DIAGNOSIS — Z8673 Personal history of transient ischemic attack (TIA), and cerebral infarction without residual deficits: Secondary | ICD-10-CM | POA: Diagnosis not present

## 2017-04-03 DIAGNOSIS — E785 Hyperlipidemia, unspecified: Secondary | ICD-10-CM | POA: Diagnosis not present

## 2017-04-03 DIAGNOSIS — Z6837 Body mass index (BMI) 37.0-37.9, adult: Secondary | ICD-10-CM | POA: Diagnosis not present

## 2017-04-03 DIAGNOSIS — Z6835 Body mass index (BMI) 35.0-35.9, adult: Secondary | ICD-10-CM | POA: Diagnosis not present

## 2017-04-03 DIAGNOSIS — E6609 Other obesity due to excess calories: Secondary | ICD-10-CM

## 2017-04-03 DIAGNOSIS — I4891 Unspecified atrial fibrillation: Secondary | ICD-10-CM | POA: Diagnosis not present

## 2017-04-03 DIAGNOSIS — Z9049 Acquired absence of other specified parts of digestive tract: Secondary | ICD-10-CM | POA: Diagnosis not present

## 2017-04-03 DIAGNOSIS — I2510 Atherosclerotic heart disease of native coronary artery without angina pectoris: Secondary | ICD-10-CM | POA: Diagnosis not present

## 2017-04-03 DIAGNOSIS — Z951 Presence of aortocoronary bypass graft: Secondary | ICD-10-CM | POA: Diagnosis not present

## 2017-04-03 DIAGNOSIS — I5032 Chronic diastolic (congestive) heart failure: Secondary | ICD-10-CM | POA: Diagnosis not present

## 2017-04-03 DIAGNOSIS — I252 Old myocardial infarction: Secondary | ICD-10-CM | POA: Diagnosis not present

## 2017-04-03 DIAGNOSIS — I25118 Atherosclerotic heart disease of native coronary artery with other forms of angina pectoris: Secondary | ICD-10-CM | POA: Diagnosis not present

## 2017-04-03 LAB — ECHOCARDIOGRAM COMPLETE
Height: 71 in
Weight: 4051.2 oz

## 2017-04-03 MED ORDER — PERFLUTREN LIPID MICROSPHERE
1.0000 mL | INTRAVENOUS | Status: AC | PRN
Start: 2017-04-03 — End: 2017-04-03
  Administered 2017-04-03: 2 mL via INTRAVENOUS
  Filled 2017-04-03: qty 10

## 2017-04-03 NOTE — Progress Notes (Signed)
  Echocardiogram 2D Echocardiogram with Definity has been performed.  Jennette Dubin 04/03/2017, 11:14 AM

## 2017-04-03 NOTE — Progress Notes (Signed)
Talked to MD Eliseo Squires, MD stated okay to discharge pt. Pt IV discontinued, catheter intact and telemetry removed. Pt has all belongings packed. Pt discharged paperwork went over at bedside with pt and pt wife. Awaiting wheelchair for discharge

## 2017-04-03 NOTE — Progress Notes (Signed)
Pt discharged via wheelchair with nurse staff. Pt has all belongings.

## 2017-04-03 NOTE — Discharge Summary (Signed)
Physician Discharge Summary  John Kemp HGD:[id number] DOB: 29-Apr-1946 DOA: 04/02/2017  PCP: John Brome, MD  Admit date: 04/02/2017 Discharge date: 04/03/2017   Recommendations for Outpatient Follow-Up:   1. PRN acid blocker for GERD   Discharge Diagnosis:   Principal Problem:   Chest pain Active Problems:   Chronic diastolic heart failure (HCC)   Hypertensive heart disease   Hx of CABG   Stroke ([place])   A-fib ([place])   Obesity   Discharge disposition:  Home.  Discharge Condition: Improved.  Diet recommendation: Low sodium, heart healthy  Wound care: None.   History of Present Illness:   John Kemp is a 71 y.o. male with a Past Medical History significant for A. fib, CABG, heart failure, stroke who presents with chest pain. Pt currently CP free.   Hospital Course by Problem:   Chest pain -suspect GERD -started after a fried meal -PRN acid blocker -echo: Study Conclusions  - Left ventricle: The cavity size was mildly dilated. Systolic   function was normal. The estimated ejection fraction was in the   range of 55% to 60%. There is akinesis of the inferolateral   myocardium. Features are consistent with a pseudonormal left   ventricular filling pattern, with concomitant abnormal relaxation   and increased filling pressure (grade 2 diastolic dysfunction). - Left atrium: The atrium was mildly dilated. - Pulmonary arteries: Systolic pressure could not be accurately   estimated.   A fib -continue home meds  Obesity Encourage weight loss     Medical Consultants:    None.   Discharge Exam:   Vitals:   04/03/17 0411 04/03/17 1240  BP: (!) 142/52 139/69  Pulse: (!) 55 (!) 56  Resp: 18   Temp: 98.1 F (36.7 C)    Vitals:   04/02/17 1159 04/02/17 2219 04/03/17 0411 04/03/17 1240  BP: (!) 147/61 (!) 155/77 (!) 142/52 139/69  Pulse: (!) 51 (!) 53 (!) 55 (!) 56  Resp: 20  18   Temp: 98.5 F (36.9 C) 98.2 F (36.8 C) 98.1 F (36.7  C)   TempSrc: Oral Oral Oral   SpO2: 95% 96% 97% 97%  Weight:   114.9 kg (253 lb 3.2 oz)   Height:        Gen:  NAD- up walking the hallways, chest pain free    The results of significant diagnostics from this hospitalization (including imaging, microbiology, ancillary and laboratory) are listed below for reference.     Procedures and Diagnostic Studies:   Dg Chest 2 View  Result Date: 04/02/2017 CLINICAL DATA:  71 year old male with chest pain. EXAM: CHEST  2 VIEW COMPARISON:  None. FINDINGS: There is mild diffuse interstitial coarsening. Left perihilar streaky densities may represent atelectatic changes or mild vascular congestion. Developing infiltrate is less likely. Clinical correlation is recommended. There is no focal consolidation. No pleural effusion or pneumothorax. Top-normal cardiac size. Median sternotomy wires. No acute osseous pathology. Right upper quadrant cholecystectomy clips. IMPRESSION: Left perihilar interstitial and streaky densities, likely atelectatic changes and less likely mild vascular congestion. Developing infiltrate is less likely. Clinical correlation is recommended. Electronically Signed   By: Anner Crete M.D.   On: 04/02/2017 03:06     Labs:   Basic Metabolic Panel:  Recent Labs Lab 04/02/17 0224  NA 136  K 4.3  CL 102  CO2 24  GLUCOSE 100*  BUN 15  CREATININE 1.24  CALCIUM 9.0   GFR Estimated Creatinine Clearance: 73.5 mL/min (by C-G formula  based on SCr of 1.24 mg/dL). Liver Function Tests: No results for input(s): AST, ALT, ALKPHOS, BILITOT, PROT, ALBUMIN in the last 168 hours. No results for input(s): LIPASE, AMYLASE in the last 168 hours. No results for input(s): AMMONIA in the last 168 hours. Coagulation profile No results for input(s): INR, PROTIME in the last 168 hours.  CBC:  Recent Labs Lab 04/02/17 0224  WBC 6.5  HGB 15.3  HCT 42.8  MCV 87.0  PLT 176   Cardiac Enzymes:  Recent Labs Lab 04/02/17 0927  04/02/17 1311  TROPONINI <0.03  <0.03 <0.03   BNP: Invalid input(s): POCBNP CBG: No results for input(s): GLUCAP in the last 168 hours. D-Dimer No results for input(s): DDIMER in the last 72 hours. Hgb A1c No results for input(s): HGBA1C in the last 72 hours. Lipid Profile  Recent Labs  04/02/17 0213  CHOL 184  HDL 31*  LDLCALC 118*  TRIG 176*  CHOLHDL 5.9   Thyroid function studies  Recent Labs  04/02/17 0927  TSH 1.724   Anemia work up No results for input(s): VITAMINB12, FOLATE, FERRITIN, TIBC, IRON, RETICCTPCT in the last 72 hours. Microbiology No results found for this or any previous visit (from the past 240 hour(s)).   Discharge Instructions:    Allergies as of 04/03/2017      Reactions   Prednisone    "makes me feel terrible"      Medication List    STOP taking these medications   penicillin v potassium 500 MG tablet Commonly known as:  VEETID     TAKE these medications   albuterol 108 (90 Base) MCG/ACT inhaler Commonly known as:  PROVENTIL HFA;VENTOLIN HFA Inhale 1-2 puffs into the lungs every 6 (six) hours as needed for wheezing or shortness of breath.   amiodarone 200 MG tablet Commonly known as:  PACERONE Take 1 tablet (200 mg total) by mouth daily.   apixaban 5 MG Tabs tablet Commonly known as:  ELIQUIS Take 5 mg by mouth 2 (two) times daily.   aspirin EC 81 MG tablet Take 1 tablet (81 mg total) by mouth daily.   carvedilol 3.125 MG tablet Commonly known as:  COREG Take 3.125 mg by mouth every evening.   cloNIDine 0.1 MG tablet Commonly known as:  CATAPRES Take 0.1 mg by mouth daily as needed (hypertension).   furosemide 40 MG tablet Commonly known as:  LASIX Take 40 mg by mouth daily as needed for fluid.   levothyroxine 125 MCG tablet Commonly known as:  SYNTHROID, LEVOTHROID Take 150 mcg by mouth daily.   losartan 100 MG tablet Commonly known as:  COZAAR Take 100 mg by mouth daily.   potassium chloride 10 MEQ  tablet Commonly known as:  K-DUR,KLOR-CON Take 10 mEq by mouth daily.   pravastatin 80 MG tablet Commonly known as:  PRAVACHOL Take 80 mg by mouth daily.   zolpidem 5 MG tablet Commonly known as:  AMBIEN Take 5 mg by mouth at bedtime as needed for sleep.      Follow-up Information    Cox, Kirsten, MD Follow up in 1 week(s).   Specialties:  Family Medicine, Interventional Cardiology, Radiology, Anesthesiology Contact information: [place] [address] [phone]        cardiology follow up with cardiologist Follow up.            Time coordinating discharge: 25 min  Signed:  [facility] 04/03/2017, 2:26 PM

## 2017-04-03 NOTE — Discharge Instructions (Signed)

## 2017-04-06 DIAGNOSIS — R072 Precordial pain: Secondary | ICD-10-CM | POA: Diagnosis not present

## 2017-04-13 NOTE — Progress Notes (Signed)
Cardiology Office Note:    Date:  04/14/2017   ID:  John Kemp, DOB [birth year], MRN [id number]  PCP:  John Brome, MD  Cardiologist:  John More, MD    Referring MD: John Brome, MD    ASSESSMENT:    1. Chest pain in adult   2. Coronary artery disease of native artery of native heart with stable angina pectoris ([place])   3. Chronic diastolic heart failure ([place])   4. Hypertensive heart disease with chronic diastolic congestive heart failure ([place])    PLAN:    In order of problems listed above:  1. Very atypical nonanginal. 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 2. Stable continue current medical therapy including aspirin beta blocker and high intensity statin 3. Stable continue his current diuretic 4. Stable continue current therapy.   Next appointment: 3 months   Medication Adjustments/Labs and Tests Ordered: Current medicines are reviewed at length with the patient today.  Concerns regarding medicines are outlined above.  Orders Placed This Encounter  Procedures  . EKG 12-Lead   Meds ordered this encounter  Medications  . DISCONTD: nitroGLYCERIN (NITROSTAT) 0.4 MG SL tablet    Sig: Place 1 tablet (0.4 mg total) under the tongue every 5 (five) minutes as needed for chest pain.    Dispense:  90 tablet    Refill:  3  . nitroGLYCERIN (NITROSTAT) 0.4 MG SL tablet    Sig: Place 1 tablet (0.4 mg total) under the tongue every 5 (five) minutes as needed for chest pain.    Dispense:  25 tablet    Refill:  11    Chief Complaint  Patient presents with  . Hospitalization Follow-up    after recent visit to [facility] for chest discomfort    History of Present Illness:    John Kemp is a 71 y.o. male with a hx  of CAD, diastolic CHF, Atrial Fibrillation, HTN, S/P CABG and on amiodoarone  and a recent stroke with visual loss  last seen last month..He developed very mild localized left sternal nonexertional chest pain that lasted for 3-4 hours he was seen in the emergency room kept in observation and discharged. Symptoms resolved spontaneously never given nitroglycerin it was nonexertional in nature unrelieved with rest no radiation associated shortness of breath diaphoresis or GI symptoms. It occurred after meals of fried fish Compliance with diet, lifestyle and medications: Yes Past Medical History:  Diagnosis Date  . A-fib ([place])   . Heart attack ([place])   . Hyperlipidemia   . Hypertension   . Stroke [facility])     Past Surgical History:  Procedure Laterality Date  . APPENDECTOMY  1956  . CHOLECYSTECTOMY  2006  . CORONARY ARTERY BYPASS GRAFT  2001  . TONSILLECTOMY  1975    Current Medications: Current Meds  Medication Sig  . albuterol (PROVENTIL HFA;VENTOLIN HFA) 108 (90 Base) MCG/ACT inhaler Inhale 1-2 puffs into the lungs every 6 (six) hours as needed for wheezing or shortness of breath.  Marland Kitchen amiodarone (PACERONE) 200 MG tablet Take 1 tablet (200 mg total) by mouth daily.  Marland Kitchen apixaban (ELIQUIS) 5 MG TABS tablet Take 5 mg by mouth 2 (two) times daily.  Marland Kitchen aspirin EC 81 MG tablet Take 1 tablet (81 mg total) by  mouth daily.  . carvedilol (COREG) 3.125 MG tablet Take 3.125 mg by mouth every evening.   . cloNIDine (CATAPRES) 0.1 MG tablet Take 0.1 mg by mouth daily as needed (hypertension).   . furosemide (LASIX) 40 MG tablet Take 40 mg by mouth daily as needed for fluid.   Marland Kitchen levothyroxine (SYNTHROID, LEVOTHROID) 150 MCG tablet Take 150 mcg by mouth daily.  Marland Kitchen losartan (COZAAR) 100 MG tablet Take 100 mg by mouth daily.  . potassium chloride (K-DUR,KLOR-CON) 10 MEQ tablet Take 10 mEq by mouth daily.  . pravastatin (PRAVACHOL) 80 MG tablet Take 80 mg by mouth daily.  Marland Kitchen zolpidem (AMBIEN) 5 MG tablet Take 5  mg by mouth at bedtime as needed for sleep.     Allergies:   Prednisone   Social History   Social History  . Marital status: Married    Spouse name: N/A  . Number of children: N/A  . Years of education: N/A   Occupational History  . Retired    Social History Main Topics  . Smoking status: Former Smoker    Packs/day: 1.00    Years: 15.00    Types: Cigarettes    Quit date: 08/29/1978  . Smokeless tobacco: Never Used  . Alcohol use No  . Drug use: No  . Sexual activity: Not Asked   Other Topics Concern  . None   Social History Narrative  . None     Family History: The patient's family history includes Breast cancer in his mother; Heart disease in his father. ROS:   Please see the history of present illness.    All other systems reviewed and are negative.  EKGs/Labs/Other Studies Reviewed:    The following studies were reviewed today:  EKG:  EKG ordered today.  The ekg ordered today demonstrates [place], stable ST and T abnormality Echo 04/03/17: Study Conclusions  - Left ventricle: The cavity size was mildly dilated. Systolic   function was normal. The estimated ejection fraction was in the   range of 55% to 60%. There is akinesis of the inferolateral   myocardium. Features are consistent with a pseudonormal left   ventricular filling pattern, with concomitant abnormal relaxation   and increased filling pressure (grade 2 diastolic dysfunction). - Left atrium: The atrium was mildly dilated.  Carotid duplex: Study Conclusions Heterogeneous plaque, bilaterally. 1-39% bilateral ICA stenosis. >50% ECA stenosis, bilaterally.  Elevated velocities with turbulent flow in the right subclavian artery.  Normal left subclavian artery. Patent vertebral arteries with antegrade flow.  Lower extremity vascular:  No evidence of segmental lower extremity arterial disease at rest, bilaterally. Normal ABI's, bilaterally. Normal right great toe-brachial index; non-compressible on  the left suggesting medial calcifications.   Recent Labs: 03/03/2017: ALT 22; NT-Pro BNP 634 04/02/2017: BUN 15; Creatinine, Ser 1.24; Hemoglobin 15.3; Platelets 176; Potassium 4.3; Sodium 136; TSH 1.724  Recent Lipid Panel    Component Value Date/Time   CHOL 184 04/02/2017 0213   TRIG 176 (H) 04/02/2017 0213   HDL 31 (L) 04/02/2017 0213   CHOLHDL 5.9 04/02/2017 0213   VLDL 35 04/02/2017 0213   LDLCALC 118 (H) 04/02/2017 0213    Physical Exam:    VS:  BP 118/74 (BP Location: Left Arm, Patient Position: Sitting)   Pulse (!) 53   Ht 5\' 10"  (1.778 m)   Wt 262 lb (118.8 kg)   SpO2 97%   BMI 37.59 kg/m     Wt Readings from Last 3 Encounters:  04/14/17 262 lb (118.8 kg)  04/03/17 253 lb 3.2 oz (114.9 kg)  03/03/17 255 lb (115.7 kg)     GEN:  Well nourished, well developed in no acute distress HEENT: Normal NECK: No JVD; No carotid bruits LYMPHATICS: No lymphadenopathy CARDIAC: RRR, no murmurs, rubs, gallops RESPIRATORY:  Clear to auscultation without rales, wheezing or rhonchi  ABDOMEN: Soft, non-tender, non-distended MUSCULOSKELETAL:  No edema; No deformity  SKIN: Warm and dry NEUROLOGIC:  Alert and oriented x 3 PSYCHIATRIC:  Normal affect    Signed, John More, MD  04/14/2017 1:05 PM    [facility]

## 2017-04-14 ENCOUNTER — Encounter: Payer: Self-pay | Admitting: Cardiology

## 2017-04-14 ENCOUNTER — Ambulatory Visit (INDEPENDENT_AMBULATORY_CARE_PROVIDER_SITE_OTHER): Payer: Medicare Other | Admitting: Cardiology

## 2017-04-14 VITALS — BP 118/74 | HR 53 | Ht 70.0 in | Wt 262.0 lb

## 2017-04-14 DIAGNOSIS — I5032 Chronic diastolic (congestive) heart failure: Secondary | ICD-10-CM

## 2017-04-14 DIAGNOSIS — I25118 Atherosclerotic heart disease of native coronary artery with other forms of angina pectoris: Secondary | ICD-10-CM

## 2017-04-14 DIAGNOSIS — R079 Chest pain, unspecified: Secondary | ICD-10-CM

## 2017-04-14 DIAGNOSIS — I110 Hypertensive heart disease with heart failure: Secondary | ICD-10-CM

## 2017-04-14 MED ORDER — NITROGLYCERIN 0.4 MG SL SUBL: 0.4000 mg | SUBLINGUAL_TABLET | SUBLINGUAL | 11 refills | Status: DC | PRN

## 2017-04-14 MED ORDER — NITROGLYCERIN 0.4 MG SL SUBL: 0.4000 mg | SUBLINGUAL_TABLET | SUBLINGUAL | 3 refills | Status: DC | PRN

## 2017-04-14 NOTE — Patient Instructions (Addendum)
Medication Instructions:  Your physician has recommended you make the following change in your medication:  START nitroglycerin 0.4 mg sublingual (under your tongue) as needed for chest pain. When having chest pain, stop what you are doing and sit down. Take 1 nitro, wait 5 minutes. Still having chest pain, take 1 nitro, wait 5 minutes. Still having chest pain, take 1 nitro, dial 911. Total of 3 nitro in 15 minutes.  Labwork: None  Testing/Procedures: You had an EKG today.  Follow-Up: Your physician recommends that you schedule a follow-up appointment in: 3 months.   Any Other Special Instructions Will Be Listed Below (If Applicable).     If you need a refill on your cardiac medications before your next appointment, please call your pharmacy.    Heart Failure  Weigh yourself every morning when you first wake up and record on a calender or note pad, bring this to your office visits. Using a pill tender can help with taking your medications consistently.  Limit your fluid intake to 2 liters daily  Limit your sodium intake to less than 2-3 grams daily. Ask if you need dietary teaching.  If you gain more than 3 pounds (from your dry weight ), double your dose of diuretic for the day.  If you gain more than 5 pounds (from your dry weight), double your dose of lasix and call your heart failure doctor.  Please do not smoke tobacco since it is very bad for your heart.  Please do not drink alcohol since it can worsen your heart failure.Also avoid OTC nonsteroidal drugs, such as advil, aleve and motrin.  Try to exercise for at least 30 minutes every day because this will help your heart be more efficient. You may be eligible for supervised cardiac rehab, ask your physician.

## 2017-05-30 DIAGNOSIS — J441 Chronic obstructive pulmonary disease with (acute) exacerbation: Secondary | ICD-10-CM | POA: Diagnosis not present

## 2017-06-15 DIAGNOSIS — Z23 Encounter for immunization: Secondary | ICD-10-CM | POA: Diagnosis not present

## 2017-06-29 DIAGNOSIS — J018 Other acute sinusitis: Secondary | ICD-10-CM | POA: Diagnosis not present

## 2017-07-12 DIAGNOSIS — Z23 Encounter for immunization: Secondary | ICD-10-CM | POA: Diagnosis not present

## 2017-07-12 DIAGNOSIS — R7301 Impaired fasting glucose: Secondary | ICD-10-CM | POA: Diagnosis not present

## 2017-07-12 DIAGNOSIS — I480 Paroxysmal atrial fibrillation: Secondary | ICD-10-CM | POA: Diagnosis not present

## 2017-07-12 DIAGNOSIS — J449 Chronic obstructive pulmonary disease, unspecified: Secondary | ICD-10-CM | POA: Diagnosis not present

## 2017-07-12 DIAGNOSIS — I110 Hypertensive heart disease with heart failure: Secondary | ICD-10-CM | POA: Diagnosis not present

## 2017-07-12 DIAGNOSIS — I5032 Chronic diastolic (congestive) heart failure: Secondary | ICD-10-CM | POA: Diagnosis not present

## 2017-07-12 DIAGNOSIS — E034 Atrophy of thyroid (acquired): Secondary | ICD-10-CM | POA: Diagnosis not present

## 2017-07-12 DIAGNOSIS — E782 Mixed hyperlipidemia: Secondary | ICD-10-CM | POA: Diagnosis not present

## 2017-07-12 DIAGNOSIS — I2510 Atherosclerotic heart disease of native coronary artery without angina pectoris: Secondary | ICD-10-CM | POA: Diagnosis not present

## 2017-07-13 NOTE — Progress Notes (Signed)
Cardiology Office Note:    Date:  07/17/2017   ID:  John Kemp, DOB Mar 31, 1946, MRN [id number]  PCP:  [name], MD  Cardiologist:  Shirlee More, MD    Referring MD: [name], MD    ASSESSMENT:    1. Chronic diastolic heart failure ([place])   2. Hypertensive heart disease with chronic diastolic congestive heart failure ([place])   3. On amiodarone therapy   4. Anticoagulated    PLAN:    In order of problems listed above:  1. Stable compensated continue his current diuretic and sodium restriction 2. Stable blood pressure target continue current treatment including beta-blocker and ARB 3. Stable continue low-dose amiodarone I will access recent labs from his PCP and asked them every 6 months to check liver function and thyroid for potential toxicity. 4. Continue his current anticoagulant.   Next appointment: 3 months   Medication Adjustments/Labs and Tests Ordered: Current medicines are reviewed at length with the patient today.  Concerns regarding medicines are outlined above.  No orders of the defined types were placed in this encounter.  No orders of the defined types were placed in this encounter.   Chief Complaint  Patient presents with  . Follow-up    3 month    History of Present Illness:    John Kemp is a 71 y.o. male with a hx of CAD, diastolic CHF, Atrial Fibrillation, HTN, S/P CABG and on amiodoarone and a stroke with visual loss  last seen 3 months ago. Compliance with diet, lifestyle and medications: Yes He remains unable to do activity with leg fatigue and generalized fatigue but is not having edema shortness of breath chest pain palpitations syncope or TIA.  Recent labs last few weeks and his PCP requested to assess for liver toxicity thyroid toxicity or low-dose amiodarone Past Medical History:  Diagnosis Date  . A-fib ([place])   . Heart attack ([place])   . Hyperlipidemia   . Hypertension   . Stroke [facility])     Past Surgical History:    Procedure Laterality Date  . APPENDECTOMY  1956  . CHOLECYSTECTOMY  2006  . CORONARY ARTERY BYPASS GRAFT  2001  . TONSILLECTOMY  1975    Current Medications: Current Meds  Medication Sig  . albuterol (PROVENTIL HFA;VENTOLIN HFA) 108 (90 Base) MCG/ACT inhaler Inhale 1-2 puffs into the lungs every 6 (six) hours as needed for wheezing or shortness of breath.  Marland Kitchen amiodarone (PACERONE) 200 MG tablet Take 1 tablet (200 mg total) by mouth daily.  Marland Kitchen apixaban (ELIQUIS) 5 MG TABS tablet Take 5 mg by mouth 2 (two) times daily.  Marland Kitchen aspirin EC 81 MG tablet Take 1 tablet (81 mg total) by mouth daily.  . carvedilol (COREG) 3.125 MG tablet Take 3.125 mg by mouth every evening.   . cloNIDine (CATAPRES) 0.1 MG tablet Take 0.1 mg by mouth daily as needed (hypertension).   . furosemide (LASIX) 40 MG tablet Take 40 mg by mouth daily as needed for fluid.   Marland Kitchen levothyroxine (SYNTHROID, LEVOTHROID) 150 MCG tablet Take 150 mcg by mouth daily.  Marland Kitchen losartan (COZAAR) 100 MG tablet Take 100 mg by mouth daily.  . potassium chloride (K-DUR,KLOR-CON) 10 MEQ tablet Take 10 mEq by mouth daily.  . pravastatin (PRAVACHOL) 80 MG tablet Take 80 mg by mouth daily.  Marland Kitchen zolpidem (AMBIEN) 5 MG tablet Take 5 mg by mouth at bedtime as needed for sleep.     Allergies:   Prednisone   Social History  Socioeconomic History  . Marital status: Married    Spouse name: None  . Number of children: None  . Years of education: None  . Highest education level: None  Social Needs  . Financial resource strain: None  . Food insecurity - worry: None  . Food insecurity - inability: None  . Transportation needs - medical: None  . Transportation needs - non-medical: None  Occupational History  . Occupation: Retired  Tobacco Use  . Smoking status: Former Smoker    Packs/day: 1.00    Years: 15.00    Pack years: 15.00    Types: Cigarettes    Last attempt to quit: 08/29/1978    Years since quitting: 38.9  . Smokeless tobacco: Never Used   Substance and Sexual Activity  . Alcohol use: No    Alcohol/week: 0.0 oz  . Drug use: No  . Sexual activity: None  Other Topics Concern  . None  Social History Narrative  . None     Family History: The patient's family history includes Breast cancer in his mother; Heart disease in his father. ROS:   Please see the history of present illness.    All other systems reviewed and are negative.  EKGs/Labs/Other Studies Reviewed:    The following studies were reviewed today:   Recent Labs: 03/03/2017: ALT 22; NT-Pro BNP 634 04/02/2017: BUN 15; Creatinine, Ser 1.24; Hemoglobin 15.3; Platelets 176; Potassium 4.3; Sodium 136; TSH 1.724  Recent Lipid Panel    Component Value Date/Time   CHOL 184 04/02/2017 0213   TRIG 176 (H) 04/02/2017 0213   HDL 31 (L) 04/02/2017 0213   CHOLHDL 5.9 04/02/2017 0213   VLDL 35 04/02/2017 0213   LDLCALC 118 (H) 04/02/2017 0213    Physical Exam:    VS:  BP 124/70 (BP Location: Right Arm, Patient Position: Sitting, Cuff Size: Large)   Pulse (!) 56   Ht 5\' 10"  (1.778 m)   Wt 263 lb (119.3 kg)   SpO2 97%   BMI 37.74 kg/m     Wt Readings from Last 3 Encounters:  07/17/17 263 lb (119.3 kg)  04/14/17 262 lb (118.8 kg)  04/03/17 253 lb 3.2 oz (114.9 kg)     07/17/17 04/14/17 03/03/17  Vitals     BP 124/70 118/74 137/72  Pulse Rate 56 53 46  SpO2 97 % 97 % 96 %  Height 5\' 10"  (1.778 m) 5\' 10"  (1.778 m) 5\' 10"  (1.778 m)  Weight 263 lb (119.3 kg) 262 lb (118.8 kg) 255 lb (115.7 kg)   GEN: marked central obesity  Well nourished, well developed in no acute distress HEENT: Normal NECK: No JVD; No carotid bruits LYMPHATICS: No lymphadenopathy CARDIAC: RRR, no murmurs, rubs, gallops RESPIRATORY:  Clear to auscultation without rales, wheezing or rhonchi  ABDOMEN: Soft, non-tender, non-distended MUSCULOSKELETAL:  No edema; No deformity  SKIN: Warm and dry NEUROLOGIC:  Alert and oriented x 3 PSYCHIATRIC:  Normal affect    Signed, Shirlee More, MD   07/17/2017 10:33 AM    [place]

## 2017-07-17 ENCOUNTER — Encounter: Payer: Self-pay | Admitting: Cardiology

## 2017-07-17 ENCOUNTER — Ambulatory Visit (INDEPENDENT_AMBULATORY_CARE_PROVIDER_SITE_OTHER): Payer: Medicare Other | Admitting: Cardiology

## 2017-07-17 VITALS — BP 124/70 | HR 56 | Ht 70.0 in | Wt 263.0 lb

## 2017-07-17 DIAGNOSIS — I5032 Chronic diastolic (congestive) heart failure: Secondary | ICD-10-CM

## 2017-07-17 DIAGNOSIS — I110 Hypertensive heart disease with heart failure: Secondary | ICD-10-CM | POA: Diagnosis not present

## 2017-07-17 DIAGNOSIS — Z79899 Other long term (current) drug therapy: Secondary | ICD-10-CM | POA: Diagnosis not present

## 2017-07-17 DIAGNOSIS — Z7901 Long term (current) use of anticoagulants: Secondary | ICD-10-CM | POA: Diagnosis not present

## 2017-07-17 NOTE — Patient Instructions (Addendum)
Medication Instructions:  Your physician recommends that you continue on your current medications as directed. Please refer to the Current Medication list given to you today.   Labwork: None  Testing/Procedures: None  Follow Up  Your physician wants you to follow-up in: 3 months. You will receive a reminder letter in the mail two months in advance. If you don't receive a letter, please call our office to schedule the follow-up appointment.   Any Other Special Instructions Will Be Listed Below (If Applicable).     If you need a refill on your cardiac medications before your next appointment, please call your pharmacy.          Heart Failure  Weigh yourself every morning when you first wake up and record on a calender or note pad, bring this to your office visits. Using a pill tender can help with taking your medications consistently.  Limit your fluid intake to 2 liters daily  Limit your sodium intake to less than 2-3 grams daily. Ask if you need dietary teaching.  If you gain more than 3 pounds (from your dry weight ), double your dose of diuretic for the day.  If you gain more than 5 pounds (from your dry weight), double your dose of lasix and call your heart failure doctor.  Please do not smoke tobacco since it is very bad for your heart.  Please do not drink alcohol since it can worsen your heart failure.Also avoid OTC nonsteroidal drugs, such as advil, aleve and motrin.  Try to exercise for at least 30 minutes every day because this will help your heart be more efficient. You may be eligible for supervised cardiac rehab, ask your physician.

## 2017-07-31 DIAGNOSIS — J441 Chronic obstructive pulmonary disease with (acute) exacerbation: Secondary | ICD-10-CM | POA: Diagnosis not present

## 2017-08-30 DIAGNOSIS — J441 Chronic obstructive pulmonary disease with (acute) exacerbation: Secondary | ICD-10-CM | POA: Diagnosis not present

## 2017-10-16 DIAGNOSIS — R7301 Impaired fasting glucose: Secondary | ICD-10-CM | POA: Diagnosis not present

## 2017-10-16 DIAGNOSIS — J449 Chronic obstructive pulmonary disease, unspecified: Secondary | ICD-10-CM | POA: Diagnosis not present

## 2017-10-16 DIAGNOSIS — I480 Paroxysmal atrial fibrillation: Secondary | ICD-10-CM | POA: Diagnosis not present

## 2017-10-16 DIAGNOSIS — I2510 Atherosclerotic heart disease of native coronary artery without angina pectoris: Secondary | ICD-10-CM | POA: Diagnosis not present

## 2017-10-16 DIAGNOSIS — I110 Hypertensive heart disease with heart failure: Secondary | ICD-10-CM | POA: Diagnosis not present

## 2017-10-16 DIAGNOSIS — I5032 Chronic diastolic (congestive) heart failure: Secondary | ICD-10-CM | POA: Diagnosis not present

## 2017-10-16 DIAGNOSIS — E782 Mixed hyperlipidemia: Secondary | ICD-10-CM | POA: Diagnosis not present

## 2017-10-16 DIAGNOSIS — M6281 Muscle weakness (generalized): Secondary | ICD-10-CM | POA: Diagnosis not present

## 2017-10-16 DIAGNOSIS — E034 Atrophy of thyroid (acquired): Secondary | ICD-10-CM | POA: Diagnosis not present

## 2017-10-20 ENCOUNTER — Encounter: Payer: Self-pay | Admitting: Cardiology

## 2017-10-20 ENCOUNTER — Ambulatory Visit (INDEPENDENT_AMBULATORY_CARE_PROVIDER_SITE_OTHER): Payer: Medicare Other | Admitting: Cardiology

## 2017-10-20 VITALS — BP 140/80 | HR 52 | Ht 70.0 in | Wt 264.0 lb

## 2017-10-20 DIAGNOSIS — I480 Paroxysmal atrial fibrillation: Secondary | ICD-10-CM | POA: Diagnosis not present

## 2017-10-20 DIAGNOSIS — Z79899 Other long term (current) drug therapy: Secondary | ICD-10-CM

## 2017-10-20 DIAGNOSIS — I110 Hypertensive heart disease with heart failure: Secondary | ICD-10-CM

## 2017-10-20 DIAGNOSIS — E7800 Pure hypercholesterolemia, unspecified: Secondary | ICD-10-CM | POA: Diagnosis not present

## 2017-10-20 DIAGNOSIS — I2510 Atherosclerotic heart disease of native coronary artery without angina pectoris: Secondary | ICD-10-CM

## 2017-10-20 DIAGNOSIS — Z7901 Long term (current) use of anticoagulants: Secondary | ICD-10-CM | POA: Diagnosis not present

## 2017-10-20 DIAGNOSIS — I5032 Chronic diastolic (congestive) heart failure: Secondary | ICD-10-CM | POA: Diagnosis not present

## 2017-10-20 DIAGNOSIS — E785 Hyperlipidemia, unspecified: Secondary | ICD-10-CM | POA: Insufficient documentation

## 2017-10-20 HISTORY — DX: Pure hypercholesterolemia, unspecified: E78.00

## 2017-10-20 NOTE — Patient Instructions (Signed)
Medication Instructions:  Your physician recommends that you continue on your current medications as directed. Please refer to the Current Medication list given to you today.  Labwork: None  Testing/Procedures: You had an EKG today.  Follow-Up: Your physician wants you to follow-up in: 6 months. You will receive a reminder letter in the mail two months in advance. If you don't receive a letter, please call our office to schedule the follow-up appointment.  Any Other Special Instructions Will Be Listed Below (If Applicable).     If you need a refill on your cardiac medications before your next appointment, please call your pharmacy.   

## 2017-10-20 NOTE — Progress Notes (Signed)
Cardiology Office Note:    Date:  10/20/2017   ID:  John Kemp, DOB May 26, 1946, MRN [id number]  PCP:  [name], MD  Cardiologist:  Shirlee More, MD    Referring MD: [name], MD    ASSESSMENT:    1. Chronic diastolic heart failure ([place])   2. Hypertensive heart disease with chronic diastolic congestive heart failure ([place])   3. PAF (paroxysmal atrial fibrillation) ([place])   4. On amiodarone therapy   5. Anticoagulated   6. CAD in native artery   7. Pure hypercholesterolemia    PLAN:    In order of problems listed above:  1. Stable no evidence of fluid overload continue his current diuretic 2. Improved blood pressure target continue current treatment including more intense ARB 3. Stable maintain sinus rhythm continue amiodarone and anticoagulant 4. Stable continue amiodarone no evidence of toxicity 5. Stable continue his anticoagulant 6. Stable continue medical treatment I would not pursue an ischemia evaluation at this time 7. Stable he is poorly statin intolerant and his LDL is reasonable on a low intensity statin.   Next appointment: 6 months   Medication Adjustments/Labs and Tests Ordered: Current medicines are reviewed at length with the patient today.  Concerns regarding medicines are outlined above.  No orders of the defined types were placed in this encounter.  No orders of the defined types were placed in this encounter.   Chief Complaint  Patient presents with  . Follow-up    3 month flup appt     History of Present Illness:    John Kemp is a 72 y.o. male with a hx of CAD, diastolic CHF, Atrial Fibrillation, HTN, S/P CABG and on amiodoarone and a stroke with visual loss last seen three months ago.. Compliance with diet, lifestyle and medications: 6 months  Overall he has not felt well.  Blood pressure was greater than 150-170 his ARB was switched and he is now back to baseline 140/80 in my office today.  He feels washed out he is  sleeping excessively during the day and is taking Ambien at bedtime every night.  I suspect is the culprit and I asked him to cut back and take it only if necessary.  He has no edema orthopnea chest pain palpitation or syncope but is having more trouble with gait and diffuse weakness.  Recent labs show no evidence of liver or thyroid toxicity from amiodarone he maintains sinus rhythm. Past Medical History:  Diagnosis Date  . A-fib ([place])   . Heart attack ([place])   . Hyperlipidemia   . Hypertension   . Stroke [facility])     Past Surgical History:  Procedure Laterality Date  . APPENDECTOMY  1956  . CHOLECYSTECTOMY  2006  . CORONARY ARTERY BYPASS GRAFT  2001  . TONSILLECTOMY  1975    Current Medications: Current Meds  Medication Sig  . albuterol (PROVENTIL HFA;VENTOLIN HFA) 108 (90 Base) MCG/ACT inhaler Inhale 1-2 puffs into the lungs every 6 (six) hours as needed for wheezing or shortness of breath.  Marland Kitchen amiodarone (PACERONE) 200 MG tablet Take 1 tablet (200 mg total) by mouth daily.  Marland Kitchen apixaban (ELIQUIS) 5 MG TABS tablet Take 5 mg by mouth 2 (two) times daily.  Marland Kitchen aspirin EC 81 MG tablet Take 1 tablet (81 mg total) by mouth daily.  . carvedilol (COREG) 3.125 MG tablet Take 3.125 mg by mouth every evening.   . cloNIDine (CATAPRES) 0.1 MG tablet Take 0.1 mg by mouth daily as  needed (hypertension).   . furosemide (LASIX) 40 MG tablet Take 40 mg by mouth daily as needed for fluid.   Marland Kitchen levothyroxine (SYNTHROID, LEVOTHROID) 150 MCG tablet Take 150 mcg by mouth daily.  . nitroGLYCERIN (NITROSTAT) 0.4 MG SL tablet Place 1 tablet (0.4 mg total) under the tongue every 5 (five) minutes as needed for chest pain.  Marland Kitchen olmesartan (BENICAR) 40 MG tablet Take 40 mg by mouth daily.  . potassium chloride (K-DUR,KLOR-CON) 10 MEQ tablet Take 10 mEq by mouth daily.  . pravastatin (PRAVACHOL) 80 MG tablet Take 80 mg by mouth daily.  Marland Kitchen zolpidem (AMBIEN) 5 MG tablet Take 5 mg by mouth at bedtime as needed for sleep.       Allergies:   Prednisone   Social History   Socioeconomic History  . Marital status: Married    Spouse name: None  . Number of children: None  . Years of education: None  . Highest education level: None  Social Needs  . Financial resource strain: None  . Food insecurity - worry: None  . Food insecurity - inability: None  . Transportation needs - medical: None  . Transportation needs - non-medical: None  Occupational History  . Occupation: Retired  Tobacco Use  . Smoking status: Former Smoker    Packs/day: 1.00    Years: 15.00    Pack years: 15.00    Types: Cigarettes    Last attempt to quit: 08/29/1978    Years since quitting: 39.1  . Smokeless tobacco: Never Used  Substance and Sexual Activity  . Alcohol use: No    Alcohol/week: 0.0 oz  . Drug use: No  . Sexual activity: None  Other Topics Concern  . None  Social History Narrative  . None     Family History: The patient's family history includes Breast cancer in his mother; Heart disease in his father. ROS:   Please see the history of present illness.    All other systems reviewed and are negative.  EKGs/Labs/Other Studies Reviewed:    The following studies were reviewed today:  EKG:  EKG ordered today.  The ekg ordered today demonstrates [facility] nonspecific ST QTc 461 10/17/17:  Chol 155, HDL 37 LDL 95, CBC and CMP were normal Recent Labs: 03/03/2017: ALT 22; NT-Pro BNP 634 04/02/2017: BUN 15; Creatinine, Ser 1.24; Hemoglobin 15.3; Platelets 176; Potassium 4.3; Sodium 136; TSH 1.724  Recent Lipid Panel    Component Value Date/Time   CHOL 184 04/02/2017 0213   TRIG 176 (H) 04/02/2017 0213   HDL 31 (L) 04/02/2017 0213   CHOLHDL 5.9 04/02/2017 0213   VLDL 35 04/02/2017 0213   LDLCALC 118 (H) 04/02/2017 0213    Physical Exam:    VS:  BP 124/72 (BP Location: Left Arm, Patient Position: Sitting, Cuff Size: Large)   Pulse (!) 52   Ht 5\' 10"  (1.778 m)   Wt 264 lb (119.7 kg)   SpO2 93%   BMI 37.88 kg/m      Wt Readings from Last 3 Encounters:  10/20/17 264 lb (119.7 kg)  07/17/17 263 lb (119.3 kg)  04/14/17 262 lb (118.8 kg)     GEN:  Well nourished, well developed in no acute distress HEENT: Normal NECK: No JVD; No carotid bruits LYMPHATICS: No lymphadenopathy CARDIAC: RRR, no murmurs, rubs, gallops RESPIRATORY:  Clear to auscultation without rales, wheezing or rhonchi  ABDOMEN: Soft, non-tender, non-distended MUSCULOSKELETAL:  No edema; No deformity  SKIN: Warm and dry NEUROLOGIC:  Alert and oriented x  3 PSYCHIATRIC:  Normal affect    Signed, Shirlee More, MD  10/20/2017 10:38 AM    [facility]

## 2017-10-23 ENCOUNTER — Ambulatory Visit (INDEPENDENT_AMBULATORY_CARE_PROVIDER_SITE_OTHER): Payer: Medicare Other | Admitting: Neurology

## 2017-10-23 ENCOUNTER — Encounter: Payer: Self-pay | Admitting: Neurology

## 2017-10-23 VITALS — BP 135/52 | HR 76 | Ht 70.0 in | Wt 260.8 lb

## 2017-10-23 DIAGNOSIS — M6281 Muscle weakness (generalized): Secondary | ICD-10-CM | POA: Diagnosis not present

## 2017-10-23 DIAGNOSIS — I2510 Atherosclerotic heart disease of native coronary artery without angina pectoris: Secondary | ICD-10-CM

## 2017-10-23 DIAGNOSIS — R29898 Other symptoms and signs involving the musculoskeletal system: Secondary | ICD-10-CM

## 2017-10-23 DIAGNOSIS — R262 Difficulty in walking, not elsewhere classified: Secondary | ICD-10-CM | POA: Diagnosis not present

## 2017-10-23 DIAGNOSIS — R296 Repeated falls: Secondary | ICD-10-CM | POA: Diagnosis not present

## 2017-10-23 DIAGNOSIS — I10 Essential (primary) hypertension: Secondary | ICD-10-CM | POA: Diagnosis not present

## 2017-10-23 NOTE — Progress Notes (Signed)
[facility] [address] [place]. [place] [address] [phone]       OFFICE CONSULT NOTE  [name] Date of Birth:  1945-11-12 Medical Record Number:  [id number]   Referring MD: Darrol Jump, PA-C Reason for Referral:  Leg weakness  HPI:  [name] is a 72 year old Caucasian male who is accompanied today by his wife. History is obtained from them as well as review of electronic medical records in care everywhere. I was unable personally to look at imaging films. He states that for the last 6-8 months from summer of 2018 he noticed that he is having some leg weakness. Is able to get out of a chair and walk fairly well but after a while he feels his legs are going to give out and feel heavy and has to sit down. He feels better after a while. He denies any fall injury or back pain. He denies radicular pain and tingling numbness or burning in his feet. He was seen by her ophthalmologist who diagnosed him to have right superior quadrantanopsia and he was referred to [facility]. He had an MRI scan of the brain done on 10/24/16  And I have reviewed the report which shows no acute infarct but changes of small vessel disease. He subsequently saw her neurologist Dr. [name] Heck in [place] on 02/06/17 who recommended he continue eliquis for his atrial fibrillation and risk factor modification. Patient denies any other symptoms suggestive of stroke TIA. He does feel his balance is off. He does trip easily and has fallen a couple of times in the last 6 months. He however denies any pain burning or tingling in his feet. He has history of atrial fibrillation and has been cardioverted 3 times. Is currently on eliquis. He does also noticed that his short-term memory is getting worse. He has trouble remembering his appointments. He also is unable to do activities like mowing the yard which he previously is to do. He has not had any evaluation for neuropathy.  ROS:   14 system  review of systems is positive for  fatigue, feeling cold, memory loss, confusion, weakness, dizziness and all other systems negative  PMH:  Past Medical History:  Diagnosis Date  . A-fib ([place])   . Heart attack ([place])   . Hyperlipidemia   . Hypertension   . Stroke [facility])     Social History:  Social History   Socioeconomic History  . Marital status: Married    Spouse name: Not on file  . Number of children: Not on file  . Years of education: Not on file  . Highest education level: Not on file  Social Needs  . Financial resource strain: Not on file  . Food insecurity - worry: Not on file  . Food insecurity - inability: Not on file  . Transportation needs - medical: Not on file  . Transportation needs - non-medical: Not on file  Occupational History  . Occupation: Retired  Tobacco Use  . Smoking status: Former Smoker    Packs/day: 1.00    Years: 15.00    Pack years: 15.00    Types: Cigarettes    Last attempt to quit: 08/29/1978    Years since quitting: 39.1  . Smokeless tobacco: Never Used  Substance and Sexual Activity  . Alcohol use: No    Alcohol/week: 0.0 oz  . Drug use: No  . Sexual activity: Not on file  Other Topics Concern  . Not on file  Social History  Narrative  . Not on file    Medications:   Current Outpatient Medications on File Prior to Visit  Medication Sig Dispense Refill  . albuterol (PROVENTIL HFA;VENTOLIN HFA) 108 (90 Base) MCG/ACT inhaler Inhale 1-2 puffs into the lungs every 6 (six) hours as needed for wheezing or shortness of breath.    Marland Kitchen amiodarone (PACERONE) 200 MG tablet Take 1 tablet (200 mg total) by mouth daily. 90 tablet 3  . apixaban (ELIQUIS) 5 MG TABS tablet Take 5 mg by mouth 2 (two) times daily.    Marland Kitchen aspirin EC 81 MG tablet Take 1 tablet (81 mg total) by mouth daily. 90 tablet 3  . carvedilol (COREG) 3.125 MG tablet Take 3.125 mg by mouth every evening.     . cloNIDine (CATAPRES) 0.1 MG tablet Take 0.1 mg by mouth daily as needed  (hypertension).     . furosemide (LASIX) 40 MG tablet Take 40 mg by mouth daily as needed for fluid.     Marland Kitchen levothyroxine (SYNTHROID, LEVOTHROID) 150 MCG tablet Take 150 mcg by mouth daily.    . nitroGLYCERIN (NITROSTAT) 0.4 MG SL tablet Place 1 tablet (0.4 mg total) under the tongue every 5 (five) minutes as needed for chest pain. 25 tablet 11  . olmesartan (BENICAR) 40 MG tablet Take 40 mg by mouth daily.  2  . potassium chloride (K-DUR,KLOR-CON) 10 MEQ tablet Take 10 mEq by mouth daily.    . pravastatin (PRAVACHOL) 80 MG tablet Take 80 mg by mouth daily.    Marland Kitchen zolpidem (AMBIEN) 5 MG tablet Take 5 mg by mouth at bedtime.      No current facility-administered medications on file prior to visit.     Allergies:   Allergies  Allergen Reactions  . Prednisone     "makes me feel terrible"    Physical Exam General: Obese middle-age Caucasian male, seated, in no evident distress Head: head normocephalic and atraumatic.   Neck: supple with no carotid or supraclavicular bruits Cardiovascular: regular rate and rhythm, no murmurs Musculoskeletal: no deformity Skin:  no rash/petichiae Vascular:  Normal pulses all extremities  Neurologic Exam Mental Status: Awake and fully alert. Oriented to place and time. Recent and remote memory intact. Attention span, concentration and fund of knowledge appropriate. Mood and affect appropriate. Diminished recall 2/3. Able to name 7 animals with full legs. Clock drawing 3/4. Cranial Nerves: Fundoscopic exam reveals sharp disc margins. Pupils equal, briskly reactive to light. Extraocular movements full without nystagmus. Visual fields full to confrontation. Hearing intact. Facial sensation intact. Face, tongue, palate moves normally and symmetrically.  Motor: Normal bulk and tone. Normal strength in all tested extremity muscles. Sensory.: Initial touch , pinprick , position and vibratory sensation from the ankle down bilaterally in a symmetric distribution Romberg  sign positive Coordination: Rapid alternating movements normal in all extremities. Finger-to-nose and heel-to-shin performed accurately bilaterally. Gait and Station: Arises from chair without difficulty. Stance is broad-based Gait demonstrates normal stride length and mild imbalance . Not able to heel, toe and tandem walk    Reflexes: 1+ and symmetric. Toes downgoing.       ASSESSMENT: 10 year Caucasian male with 6-8 months history of leg weakness likely multifactorial in etiology due to underlying peripheral neuropathy as well as degenerative spine disease and prior stroke. Vascular risk factors of atrial fibrillation, coronary artery disease, hypertension, hyperlipidemia and obesity    PLAN: I had a long discussion with the patient and his wife regarding his complaints of difficulty walking which  may appear to be multifactorial. I suspect he has an underlying peripheral neuropathy as well as spinal stenosis. Patient is reluctant to undergo an MRI scan at the present time hence we will check on EMG nerve conduction study and check lab work for reversible causes of neuropathy including checking amiodarone level. Check lab work for memory loss as well. Continue eliquis for stroke prevention and strict control of hypertension with blood pressure goal below 130/90, lipids with LDL cholesterol goal below 70 mg percent. Patient encouraged to eat a healthy diet and to be active and lose weight. . Greater than 50% time during this 45 minute consultation visit was spent on counseling and coordination of care about his gait difficulties, stroke and answered questions He will return for follow-up in 2 months or call earlier if necessary. Antony Contras, MD  [facility] [address] [place], [place] [address]  Phone [phone] Fax [phone] Note: This document was prepared with digital dictation and possible smart phrase technology. Any transcriptional errors that  result from this process are unintentional.

## 2017-10-23 NOTE — Patient Instructions (Addendum)
I had a long discussion with the patient and his wife regarding his complaints of difficulty walking which may appear to be multifactorial. I suspect he has an underlying peripheral neuropathy as well as spinal stenosis. Patient is reluctant to undergo an MRI scan at the present time hence we will check on EMG nerve conduction study and check lab work for reversible causes of neuropathy including checking amiodarone level. Check lab work for memory loss as well. Continue eliquis for stroke prevention and strict control of hypertension with blood pressure goal below 130/90, lipids with LDL cholesterol goal below 70 mg percent. Patient encouraged to eat a healthy diet and to be active and lose weight. He will return for follow-up in 2 months or call earlier if necessary.

## 2017-10-25 ENCOUNTER — Telehealth: Payer: Self-pay

## 2017-10-25 LAB — NEUROPATHY PANEL
A/G Ratio: 1.4 (ref 0.7–1.7)
ALPHA 2: 0.8 g/dL (ref 0.4–1.0)
ANGIO CONVERT ENZYME: 36 U/L (ref 14–82)
Albumin ELP: 3.8 g/dL (ref 2.9–4.4)
Alpha 1: 0.2 g/dL (ref 0.0–0.4)
Anti Nuclear Antibody(ANA): NEGATIVE
BETA: 1.1 g/dL (ref 0.7–1.3)
GAMMA GLOBULIN: 0.7 g/dL (ref 0.4–1.8)
Globulin, Total: 2.8 g/dL (ref 2.2–3.9)
Rhuematoid fact SerPl-aCnc: <10 IU/mL (ref 0.0–13.9)
Sed Rate: 3 mm/hr (ref 0–30)
TOTAL PROTEIN: 6.6 g/dL (ref 6.0–8.5)
TSH: 0.843 u[IU]/mL (ref 0.450–4.500)
VIT D 25 HYDROXY: 13.6 ng/mL — AB (ref 30.0–100.0)
Vitamin B-12: 275 pg/mL (ref 232–1245)

## 2017-10-25 LAB — AMIODARONE LEVEL
Amiodarone, Serum: 1.4 ug/mL (ref 1.0–2.5)
Noramiodarone,S: 0.9 ug/mL — ABNORMAL LOW (ref 1.0–2.5)

## 2017-10-25 LAB — DEMENTIA PANEL
HOMOCYSTEINE: 18.2 umol/L — AB (ref 0.0–15.0)
RPR Ser Ql: NONREACTIVE

## 2017-10-25 NOTE — Telephone Encounter (Signed)
-----   Message from Garvin Fila, MD sent at 10/25/2017 12:49 PM EST ----- Kindly inform the patient that all the lab results are not back yet but vitamin B12 and thyroid hormone levels are normal. Vitamin D levels are low and she needs to see her primary physician John Kemp discuss vitamin D replacement.

## 2017-10-25 NOTE — Telephone Encounter (Signed)
Notes recorded by Marval Regal, RN on 10/25/2017 at 3:22 PM EST Left vm on wife cell phone to call back about lab work results. ------  Notes recorded by Marval Regal, RN on 10/25/2017 at 3:21 PM EST Tried to call home number but it rang a busy signal.  ------

## 2017-10-26 DIAGNOSIS — R296 Repeated falls: Secondary | ICD-10-CM | POA: Diagnosis not present

## 2017-10-26 DIAGNOSIS — R262 Difficulty in walking, not elsewhere classified: Secondary | ICD-10-CM | POA: Diagnosis not present

## 2017-10-26 DIAGNOSIS — M6281 Muscle weakness (generalized): Secondary | ICD-10-CM | POA: Diagnosis not present

## 2017-10-26 DIAGNOSIS — I10 Essential (primary) hypertension: Secondary | ICD-10-CM | POA: Diagnosis not present

## 2017-10-26 NOTE — Telephone Encounter (Signed)
Pt's wife returned RN's call °

## 2017-10-26 NOTE — Telephone Encounter (Signed)
Notes recorded by Marval Regal, RN on 10/26/2017 at 11:46 AM EST Rn return patients call about her husband labs. She is on the dpr. Rn stated all the labs are not back.The pt b12, and thyroid hormone levels are normal. The vitamin d levels are low, and pt needs to see PCP to manage his low vitamin d. RN stated a copy of the labs will be fax and sent via epic to Dr. Cyril Mourning COx. The RN advised the wife to have her husband call his PCP for an appt of evaluation of his vitamin d. The wife verbalized understanding.

## 2017-10-27 DIAGNOSIS — R262 Difficulty in walking, not elsewhere classified: Secondary | ICD-10-CM | POA: Diagnosis not present

## 2017-10-27 DIAGNOSIS — I10 Essential (primary) hypertension: Secondary | ICD-10-CM | POA: Diagnosis not present

## 2017-10-27 DIAGNOSIS — R296 Repeated falls: Secondary | ICD-10-CM | POA: Diagnosis not present

## 2017-10-27 DIAGNOSIS — M6281 Muscle weakness (generalized): Secondary | ICD-10-CM | POA: Diagnosis not present

## 2017-10-30 DIAGNOSIS — R296 Repeated falls: Secondary | ICD-10-CM | POA: Diagnosis not present

## 2017-10-30 DIAGNOSIS — M6281 Muscle weakness (generalized): Secondary | ICD-10-CM | POA: Diagnosis not present

## 2017-10-30 DIAGNOSIS — I10 Essential (primary) hypertension: Secondary | ICD-10-CM | POA: Diagnosis not present

## 2017-10-30 DIAGNOSIS — R262 Difficulty in walking, not elsewhere classified: Secondary | ICD-10-CM | POA: Diagnosis not present

## 2017-11-03 DIAGNOSIS — I10 Essential (primary) hypertension: Secondary | ICD-10-CM | POA: Diagnosis not present

## 2017-11-03 DIAGNOSIS — R296 Repeated falls: Secondary | ICD-10-CM | POA: Diagnosis not present

## 2017-11-03 DIAGNOSIS — M6281 Muscle weakness (generalized): Secondary | ICD-10-CM | POA: Diagnosis not present

## 2017-11-03 DIAGNOSIS — R262 Difficulty in walking, not elsewhere classified: Secondary | ICD-10-CM | POA: Diagnosis not present

## 2017-11-06 DIAGNOSIS — R262 Difficulty in walking, not elsewhere classified: Secondary | ICD-10-CM | POA: Diagnosis not present

## 2017-11-06 DIAGNOSIS — M6281 Muscle weakness (generalized): Secondary | ICD-10-CM | POA: Diagnosis not present

## 2017-11-06 DIAGNOSIS — I10 Essential (primary) hypertension: Secondary | ICD-10-CM | POA: Diagnosis not present

## 2017-11-06 DIAGNOSIS — R296 Repeated falls: Secondary | ICD-10-CM | POA: Diagnosis not present

## 2017-11-08 DIAGNOSIS — L821 Other seborrheic keratosis: Secondary | ICD-10-CM | POA: Diagnosis not present

## 2017-11-08 DIAGNOSIS — L578 Other skin changes due to chronic exposure to nonionizing radiation: Secondary | ICD-10-CM | POA: Diagnosis not present

## 2017-11-08 DIAGNOSIS — L570 Actinic keratosis: Secondary | ICD-10-CM | POA: Diagnosis not present

## 2017-11-10 DIAGNOSIS — R296 Repeated falls: Secondary | ICD-10-CM | POA: Diagnosis not present

## 2017-11-10 DIAGNOSIS — I10 Essential (primary) hypertension: Secondary | ICD-10-CM | POA: Diagnosis not present

## 2017-11-10 DIAGNOSIS — M6281 Muscle weakness (generalized): Secondary | ICD-10-CM | POA: Diagnosis not present

## 2017-11-10 DIAGNOSIS — R262 Difficulty in walking, not elsewhere classified: Secondary | ICD-10-CM | POA: Diagnosis not present

## 2017-11-13 DIAGNOSIS — M6281 Muscle weakness (generalized): Secondary | ICD-10-CM | POA: Diagnosis not present

## 2017-11-13 DIAGNOSIS — R262 Difficulty in walking, not elsewhere classified: Secondary | ICD-10-CM | POA: Diagnosis not present

## 2017-11-13 DIAGNOSIS — I10 Essential (primary) hypertension: Secondary | ICD-10-CM | POA: Diagnosis not present

## 2017-11-13 DIAGNOSIS — R296 Repeated falls: Secondary | ICD-10-CM | POA: Diagnosis not present

## 2017-11-15 ENCOUNTER — Ambulatory Visit (INDEPENDENT_AMBULATORY_CARE_PROVIDER_SITE_OTHER): Payer: Medicare Other | Admitting: Neurology

## 2017-11-15 ENCOUNTER — Encounter: Payer: Self-pay | Admitting: Neurology

## 2017-11-15 DIAGNOSIS — R29898 Other symptoms and signs involving the musculoskeletal system: Secondary | ICD-10-CM

## 2017-11-15 NOTE — Procedures (Signed)
     HISTORY:  Carle Fenech is a 72 year old gentleman with a history of cerebrovascular disease.  The patient has noted a 70-month history of some arms with leg weakness, right equal to left.  The patient has had only an occasional fall.  He denies any numbness or discomfort down the legs on either side.  He is being evaluated for the leg weakness.  NERVE CONDUCTION STUDIES:  Nerve conduction studies were performed on both lower extremities. The distal motor latencies and motor amplitudes for the peroneal and posterior tibial nerves were within normal limits. The nerve conduction velocities for these nerves were also normal. The F wave latencies for the posterior tibial nerves were normal bilaterally. The sensory latencies for the peroneal and sural nerves were within normal limits.   EMG STUDIES:  EMG study was performed on the right lower extremity:  The tibialis anterior muscle reveals 2 to 4K motor units with full recruitment. No fibrillations or positive waves were seen. The peroneus tertius muscle reveals 2 to 4K motor units with full recruitment. No fibrillations or positive waves were seen. The medial gastrocnemius muscle reveals 1 to 3K motor units with full recruitment. No fibrillations or positive waves were seen. The vastus lateralis muscle reveals 2 to 4K motor units with full recruitment. No fibrillations or positive waves were seen. The iliopsoas muscle reveals 2 to 4K motor units with full recruitment. No fibrillations or positive waves were seen. The biceps femoris muscle (long head) reveals 2 to 4K motor units with full recruitment. No fibrillations or positive waves were seen. The lumbosacral paraspinal muscles were tested at 3 levels, and revealed no abnormalities of insertional activity at all 3 levels tested. There was good relaxation.  EMG study was performed on the left lower extremity:  The tibialis anterior muscle reveals 2 to 4K motor units with full  recruitment. No fibrillations or positive waves were seen. The peroneus tertius muscle reveals 2 to 4K motor units with full recruitment. No fibrillations or positive waves were seen. The medial gastrocnemius muscle reveals 1 to 3K motor units with full recruitment. No fibrillations or positive waves were seen. The vastus lateralis muscle reveals 2 to 4K motor units with full recruitment. No fibrillations or positive waves were seen. The iliopsoas muscle reveals 2 to 4K motor units with full recruitment. No fibrillations or positive waves were seen. The biceps femoris muscle (long head) reveals 2 to 4K motor units with full recruitment. No fibrillations or positive waves were seen. The lumbosacral paraspinal muscles were tested at 3 levels, and revealed no abnormalities of insertional activity at all 3 levels tested. There was good relaxation.   IMPRESSION:  Nerve conduction studies done on both lower extremities were within normal limits.  No evidence of a peripheral neuropathy was seen.  EMG evaluation of both lower extremities were unremarkable without evidence of a myopathic or neuropathic disorder.  Jill Alexanders MD 11/15/2017 1:53 PM  [facility] [address] [place], [place] [address]  Phone [phone] Fax [phone]

## 2017-11-15 NOTE — Progress Notes (Signed)
Please refer to EMG and nerve conduction study procedure note. 

## 2017-11-15 NOTE — Progress Notes (Signed)
[facility]    Nerve / Sites Muscle Latency Ref. Amplitude Ref. Rel Amp Segments Distance Velocity Ref. Area    ms ms mV mV %  cm m/s m/s mVms  R Peroneal - EDB     Ankle EDB 4.5 ?6.5 6.7 ?2.0 100 Ankle - EDB 9   21.5     Fib head EDB 10.6  6.1  91.3 Fib head - Ankle 29 48 ?44 20.1     Pop fossa EDB 12.8  6.7  108 Pop fossa - Fib head 10 45 ?44 22.0         Pop fossa - Ankle      L Peroneal - EDB     Ankle EDB 4.9 ?6.5 6.6 ?2.0 100 Ankle - EDB 9   20.7     Fib head EDB 10.6  6.7  102 Fib head - Ankle 28 50 ?44 21.9     Pop fossa EDB 12.9  6.6  97.4 Pop fossa - Fib head 12 52 ?44 21.6         Pop fossa - Ankle      R Tibial - AH     Ankle AH 4.0 ?5.8 7.6 ?4.0 100 Ankle - AH 9   26.0     Pop fossa AH 13.2  5.4  71.3 Pop fossa - Ankle 38 41 ?41 24.2  L Tibial - AH     Ankle AH 4.8 ?5.8 6.8 ?4.0 100 Ankle - AH 9   22.4     Pop fossa AH 14.0  5.4  79.2 Pop fossa - Ankle 38 42 ?41 21.1             SNC    Nerve / Sites Rec. Site Peak Lat Ref.  Amp Ref. Segments Distance    ms ms V V  cm  R Sural - Ankle (Calf)     Calf Ankle 4.3 ?4.4 6 ?6 Calf - Ankle 14  L Sural - Ankle (Calf)     Calf Ankle 4.4 ?4.4 7 ?6 Calf - Ankle 14  R Superficial peroneal - Ankle     Lat leg Ankle 4.2 ?4.4 6 ?6 Lat leg - Ankle 14  L Superficial peroneal - Ankle     Lat leg Ankle 4.0 ?4.4 7 ?6 Lat leg - Ankle 14              F  Wave    Nerve F Lat Ref.   ms ms  R Tibial - AH 50.5 ?56.0  L Tibial - AH 50.9 ?56.0         EMG full

## 2017-11-16 ENCOUNTER — Telehealth: Payer: Self-pay

## 2017-11-16 NOTE — Telephone Encounter (Signed)
Notes recorded by Marval Regal, RN on 11/16/2017 at 9:48 AM EDT Left vm for patient to call back about NCV results. ------

## 2017-11-16 NOTE — Telephone Encounter (Signed)
-----   Message from Garvin Fila, MD sent at 11/15/2017  7:43 PM EDT ----- Mitchell Heir inform patient that EMG study was normal

## 2017-11-16 NOTE — Telephone Encounter (Signed)
Note    Rn spoke with patient about emg/ncv results.Rn stated per Dr. Leonie Man the EMG was normal. Pt verbalized understanding.

## 2017-11-16 NOTE — Telephone Encounter (Signed)
Pt returning RN's call.

## 2017-11-16 NOTE — Telephone Encounter (Signed)
Rn spoke with patient about emg/ncv results.Rn stated per Dr. Leonie Man the EMG was normal. Pt verbalized understanding.

## 2017-11-17 DIAGNOSIS — R296 Repeated falls: Secondary | ICD-10-CM | POA: Diagnosis not present

## 2017-11-17 DIAGNOSIS — I10 Essential (primary) hypertension: Secondary | ICD-10-CM | POA: Diagnosis not present

## 2017-11-17 DIAGNOSIS — M6281 Muscle weakness (generalized): Secondary | ICD-10-CM | POA: Diagnosis not present

## 2017-11-17 DIAGNOSIS — R262 Difficulty in walking, not elsewhere classified: Secondary | ICD-10-CM | POA: Diagnosis not present

## 2017-11-20 DIAGNOSIS — I10 Essential (primary) hypertension: Secondary | ICD-10-CM | POA: Diagnosis not present

## 2017-11-20 DIAGNOSIS — M6281 Muscle weakness (generalized): Secondary | ICD-10-CM | POA: Diagnosis not present

## 2017-11-20 DIAGNOSIS — R262 Difficulty in walking, not elsewhere classified: Secondary | ICD-10-CM | POA: Diagnosis not present

## 2017-11-20 DIAGNOSIS — R296 Repeated falls: Secondary | ICD-10-CM | POA: Diagnosis not present

## 2017-11-21 DIAGNOSIS — F5101 Primary insomnia: Secondary | ICD-10-CM | POA: Diagnosis not present

## 2017-11-21 DIAGNOSIS — I5032 Chronic diastolic (congestive) heart failure: Secondary | ICD-10-CM | POA: Diagnosis not present

## 2017-11-21 DIAGNOSIS — I110 Hypertensive heart disease with heart failure: Secondary | ICD-10-CM | POA: Diagnosis not present

## 2017-11-23 DIAGNOSIS — R296 Repeated falls: Secondary | ICD-10-CM | POA: Diagnosis not present

## 2017-11-23 DIAGNOSIS — I10 Essential (primary) hypertension: Secondary | ICD-10-CM | POA: Diagnosis not present

## 2017-11-23 DIAGNOSIS — R262 Difficulty in walking, not elsewhere classified: Secondary | ICD-10-CM | POA: Diagnosis not present

## 2017-11-23 DIAGNOSIS — M6281 Muscle weakness (generalized): Secondary | ICD-10-CM | POA: Diagnosis not present

## 2017-11-24 DIAGNOSIS — M6281 Muscle weakness (generalized): Secondary | ICD-10-CM | POA: Diagnosis not present

## 2017-11-24 DIAGNOSIS — I10 Essential (primary) hypertension: Secondary | ICD-10-CM | POA: Diagnosis not present

## 2017-11-24 DIAGNOSIS — R262 Difficulty in walking, not elsewhere classified: Secondary | ICD-10-CM | POA: Diagnosis not present

## 2017-11-24 DIAGNOSIS — R296 Repeated falls: Secondary | ICD-10-CM | POA: Diagnosis not present

## 2017-11-27 DIAGNOSIS — I10 Essential (primary) hypertension: Secondary | ICD-10-CM | POA: Diagnosis not present

## 2017-11-27 DIAGNOSIS — M6281 Muscle weakness (generalized): Secondary | ICD-10-CM | POA: Diagnosis not present

## 2017-11-27 DIAGNOSIS — R262 Difficulty in walking, not elsewhere classified: Secondary | ICD-10-CM | POA: Diagnosis not present

## 2017-11-27 DIAGNOSIS — R296 Repeated falls: Secondary | ICD-10-CM | POA: Diagnosis not present

## 2017-11-29 DIAGNOSIS — M6281 Muscle weakness (generalized): Secondary | ICD-10-CM | POA: Diagnosis not present

## 2017-11-29 DIAGNOSIS — R262 Difficulty in walking, not elsewhere classified: Secondary | ICD-10-CM | POA: Diagnosis not present

## 2017-11-29 DIAGNOSIS — R296 Repeated falls: Secondary | ICD-10-CM | POA: Diagnosis not present

## 2017-11-29 DIAGNOSIS — I10 Essential (primary) hypertension: Secondary | ICD-10-CM | POA: Diagnosis not present

## 2017-12-01 DIAGNOSIS — M6281 Muscle weakness (generalized): Secondary | ICD-10-CM | POA: Diagnosis not present

## 2017-12-01 DIAGNOSIS — R262 Difficulty in walking, not elsewhere classified: Secondary | ICD-10-CM | POA: Diagnosis not present

## 2017-12-01 DIAGNOSIS — R296 Repeated falls: Secondary | ICD-10-CM | POA: Diagnosis not present

## 2017-12-01 DIAGNOSIS — I10 Essential (primary) hypertension: Secondary | ICD-10-CM | POA: Diagnosis not present

## 2017-12-04 DIAGNOSIS — I10 Essential (primary) hypertension: Secondary | ICD-10-CM | POA: Diagnosis not present

## 2017-12-04 DIAGNOSIS — R262 Difficulty in walking, not elsewhere classified: Secondary | ICD-10-CM | POA: Diagnosis not present

## 2017-12-04 DIAGNOSIS — M6281 Muscle weakness (generalized): Secondary | ICD-10-CM | POA: Diagnosis not present

## 2017-12-04 DIAGNOSIS — R296 Repeated falls: Secondary | ICD-10-CM | POA: Diagnosis not present

## 2017-12-06 DIAGNOSIS — R296 Repeated falls: Secondary | ICD-10-CM | POA: Diagnosis not present

## 2017-12-06 DIAGNOSIS — I10 Essential (primary) hypertension: Secondary | ICD-10-CM | POA: Diagnosis not present

## 2017-12-06 DIAGNOSIS — M6281 Muscle weakness (generalized): Secondary | ICD-10-CM | POA: Diagnosis not present

## 2017-12-06 DIAGNOSIS — R262 Difficulty in walking, not elsewhere classified: Secondary | ICD-10-CM | POA: Diagnosis not present

## 2017-12-11 DIAGNOSIS — R262 Difficulty in walking, not elsewhere classified: Secondary | ICD-10-CM | POA: Diagnosis not present

## 2017-12-11 DIAGNOSIS — M6281 Muscle weakness (generalized): Secondary | ICD-10-CM | POA: Diagnosis not present

## 2017-12-11 DIAGNOSIS — I10 Essential (primary) hypertension: Secondary | ICD-10-CM | POA: Diagnosis not present

## 2017-12-11 DIAGNOSIS — R296 Repeated falls: Secondary | ICD-10-CM | POA: Diagnosis not present

## 2018-01-01 ENCOUNTER — Encounter: Payer: Self-pay | Admitting: Neurology

## 2018-01-01 ENCOUNTER — Ambulatory Visit (INDEPENDENT_AMBULATORY_CARE_PROVIDER_SITE_OTHER): Payer: Medicare Other | Admitting: Neurology

## 2018-01-01 ENCOUNTER — Telehealth: Payer: Self-pay | Admitting: Neurology

## 2018-01-01 VITALS — BP 119/70 | HR 56 | Ht 70.0 in | Wt 259.0 lb

## 2018-01-01 DIAGNOSIS — I2510 Atherosclerotic heart disease of native coronary artery without angina pectoris: Secondary | ICD-10-CM | POA: Diagnosis not present

## 2018-01-01 DIAGNOSIS — R29898 Other symptoms and signs involving the musculoskeletal system: Secondary | ICD-10-CM

## 2018-01-01 NOTE — Patient Instructions (Signed)
I had a long discussion with the patient and his wife regarding his symptoms of leg weakness brought on by walking may likely represent lumbar claudication and need to evaluate this further by checking an MRI scan of the lumbar spine. Patient appears reluctant due to claustrophobia and agrees to undergo MRI only under full anesthesia hence we will arrange for this at [facility], hospital. He was advised to continue vitamin D replacement. He'll return for follow-up in 6 months or call earlier if necessary.

## 2018-01-01 NOTE — Progress Notes (Signed)
[facility] [address] [place]. [place] [address] [phone]       OFFICE FOLLOW UP VISIT  NOTE  [name] Date of Birth:  August 07, 1946 Medical Record Number:  [id number]   Referring MD: Darrol Jump, PA-C Reason for Referral:  Leg weakness  HPI: Initial consult 10/23/17 :  [name] is a 72 year old Caucasian male who is accompanied today by his wife. History is obtained from them as well as review of electronic medical records in care everywhere. I was unable personally to look at imaging films. He states that for the last 6-8 months from summer of 2018 he noticed that he is having some leg weakness. Is able to get out of a chair and walk fairly well but after a while he feels his legs are going to give out and feel heavy and has to sit down. He feels better after a while. He denies any fall injury or back pain. He denies radicular pain and tingling numbness or burning in his feet. He was seen by her ophthalmologist who diagnosed him to have right superior quadrantanopsia and he was referred to [facility]. He had an MRI scan of the brain done on 10/24/16  And I have reviewed the report which shows no acute infarct but changes of small vessel disease. He subsequently saw her neurologist Dr. [name] Heck in [place] on 02/06/17 who recommended he continue eliquis for his atrial fibrillation and risk factor modification. Patient denies any other symptoms suggestive of stroke TIA. He does feel his balance is off. He does trip easily and has fallen a couple of times in the last 6 months. He however denies any pain burning or tingling in his feet. He has history of atrial fibrillation and has been cardioverted 3 times. Is currently on eliquis. He does also noticed that his short-term memory is getting worse. He has trouble remembering his appointments. He also is unable to do activities like mowing the yard which he previously is to do. He has not had any evaluation for  neuropathy. Update 01/01/2018 : he returns for follow-up after last visit 2 months ago. He is a complaint by his wife. He continues to have intermittent leg weakness and heaviness with walking a lot 100 - 150 feet. He denies any back pain, radicular pain, tingling numbness or burning in his feet. The patient did undergo lab work on 10/23/17 that showed normal vitamin B12, TSH and peripheral neuropathy labs except for low vitamin D. He has been started on vitamin D replacement by his primary physician for the last 6 weeks but has not noticed an improvement in his leg weakness and walking. A milder level was normal. Patient had EMG nerve conduction study done 11/15/17 which showed no evidence of neuropathy or radiculopathy. She states she has good days and bad days but mostly bad days. He feels that his leg gets heavy and starts dragging particularly his right leg. He has not had any falls or injuries. The patient states he did have peripheral arterial Dopplers done by his cardiologist Dr. Bettina Gavia a year ago and it was normal. Patient is extremely claustrophobic and states he'll undergo MRI of the lumbar spine only if it is under general anesthesia. ROS:   14 system review of systems is positive for   Walking difficulty and leg weakness only and all other systems negative  PMH:  Past Medical History:  Diagnosis Date  . A-fib ([place])   . Heart attack ([place])   .  Hyperlipidemia   . Hypertension   . Stroke [facility])     Social History:  Social History   Socioeconomic History  . Marital status: Married    Spouse name: Not on file  . Number of children: Not on file  . Years of education: Not on file  . Highest education level: Not on file  Occupational History  . Occupation: Retired  Scientific laboratory technician  . Financial resource strain: Not on file  . Food insecurity:    Worry: Not on file    Inability: Not on file  . Transportation needs:    Medical: Not on file    Non-medical: Not on file  Tobacco Use  .  Smoking status: Former Smoker    Packs/day: 1.00    Years: 15.00    Pack years: 15.00    Types: Cigarettes    Last attempt to quit: 08/29/1978    Years since quitting: 39.3  . Smokeless tobacco: Never Used  Substance and Sexual Activity  . Alcohol use: No    Alcohol/week: 0.0 oz  . Drug use: No  . Sexual activity: Not on file  Lifestyle  . Physical activity:    Days per week: Not on file    Minutes per session: Not on file  . Stress: Not on file  Relationships  . Social connections:    Talks on phone: Not on file    Gets together: Not on file    Attends religious service: Not on file    Active member of club or organization: Not on file    Attends meetings of clubs or organizations: Not on file    Relationship status: Not on file  . Intimate partner violence:    Fear of current or ex partner: Not on file    Emotionally abused: Not on file    Physically abused: Not on file    Forced sexual activity: Not on file  Other Topics Concern  . Not on file  Social History Narrative  . Not on file    Medications:   Current Outpatient Medications on File Prior to Visit  Medication Sig Dispense Refill  . albuterol (PROVENTIL HFA;VENTOLIN HFA) 108 (90 Base) MCG/ACT inhaler Inhale 1-2 puffs into the lungs every 6 (six) hours as needed for wheezing or shortness of breath.    Marland Kitchen amiodarone (PACERONE) 200 MG tablet Take 1 tablet (200 mg total) by mouth daily. 90 tablet 3  . apixaban (ELIQUIS) 5 MG TABS tablet Take 5 mg by mouth 2 (two) times daily.    Marland Kitchen aspirin EC 81 MG tablet Take 1 tablet (81 mg total) by mouth daily. 90 tablet 3  . carvedilol (COREG) 3.125 MG tablet Take 3.125 mg by mouth every evening.     . cloNIDine (CATAPRES) 0.1 MG tablet Take 0.1 mg by mouth daily as needed (hypertension).     . furosemide (LASIX) 40 MG tablet Take 40 mg by mouth daily as needed for fluid.     Marland Kitchen levothyroxine (SYNTHROID, LEVOTHROID) 150 MCG tablet Take 150 mcg by mouth daily.    . nitroGLYCERIN  (NITROSTAT) 0.4 MG SL tablet Place 1 tablet (0.4 mg total) under the tongue every 5 (five) minutes as needed for chest pain. 25 tablet 11  . potassium chloride (K-DUR,KLOR-CON) 10 MEQ tablet Take 10 mEq by mouth daily.    . pravastatin (PRAVACHOL) 80 MG tablet Take 80 mg by mouth daily.    . Vitamin D, Ergocalciferol, (DRISDOL) 50000 units CAPS capsule Take 50,000  Units by mouth every 7 (seven) days.    Marland Kitchen zolpidem (AMBIEN) 5 MG tablet Take 5 mg by mouth at bedtime.      No current facility-administered medications on file prior to visit.     Allergies:   Allergies  Allergen Reactions  . Prednisone     "makes me feel terrible"    Physical Exam General: Obese middle-age Caucasian male, seated, in no evident distress Head: head normocephalic and atraumatic.   Neck: supple with no carotid or supraclavicular bruits Cardiovascular: regular rate and rhythm, no murmurs Musculoskeletal: no deformity Skin:  no rash/petichiae Vascular:  Normal pulses all extremities  Neurologic Exam Mental Status: Awake and fully alert. Oriented to place and time. Recent and remote memory intact. Attention span, concentration and fund of knowledge appropriate. Mood and affect appropriate.   Cranial Nerves: Fundoscopic exam not done Pupils equal, briskly reactive to light. Extraocular movements full without nystagmus. Visual fields full to confrontation. Hearing intact. Facial sensation intact. Face, tongue, palate moves normally and symmetrically.  Motor: Normal bulk and tone. Normal strength in all tested extremity muscles. Sensory.: Initial touch , pinprick , position and vibratory sensation from the ankle down bilaterally in a symmetric distribution Romberg sign positive Coordination: Rapid alternating movements normal in all extremities. Finger-to-nose and heel-to-shin performed accurately bilaterally. Gait and Station: Arises from chair without difficulty. Stance is broad-based Gait demonstrates normal  stride length and mild imbalance . Not able to heel, toe and tandem walk    Reflexes: 1+ and symmetric. Toes downgoing.       ASSESSMENT: 77 year Caucasian male with 6-8 months history of leg weakness of unclear etiology but likely   due to lumbar canal claudication due todegenerative spine disease and prior stroke. Vascular risk factors of atrial fibrillation, coronary artery disease, hypertension, hyperlipidemia and obesity    PLAN: I had a long discussion with the patient and his wife regarding his symptoms of leg weakness brought on by walking may likely represent lumbar claudication and need to evaluate this further by checking an MRI scan of the lumbar spine. Patient appears reluctant due to claustrophobia and agrees to undergo MRI only under full anesthesia hence we will arrange for this at [facility], hospital. He was advised to continue vitamin D replacement. He'll return for follow-up in 6 months or call earlier if necessary. Greater than 50% time during this 25 minute  visit was spent on counseling and coordination of care about his gait difficulties, stroke and answered questions   Antony Contras, MD  [facility] [address] [place],  [address]  Phone [phone] Fax [phone] Note: This document was prepared with digital dictation and possible smart phrase technology. Any transcriptional errors that result from this process are unintentional.

## 2018-01-01 NOTE — Telephone Encounter (Signed)
Medicare/BCBS supp auth: [employer]   Patient is scheduled at [facility] for sedated for Thursday 01/18/18. I left a  Detail vmail stating he will need to arrive at 8:00 AM. Nothing to eat or drink after Midnight and he will need a driver.

## 2018-01-15 DIAGNOSIS — E782 Mixed hyperlipidemia: Secondary | ICD-10-CM | POA: Diagnosis not present

## 2018-01-15 DIAGNOSIS — E559 Vitamin D deficiency, unspecified: Secondary | ICD-10-CM | POA: Diagnosis not present

## 2018-01-15 DIAGNOSIS — R7301 Impaired fasting glucose: Secondary | ICD-10-CM | POA: Diagnosis not present

## 2018-01-15 DIAGNOSIS — E034 Atrophy of thyroid (acquired): Secondary | ICD-10-CM | POA: Diagnosis not present

## 2018-01-15 DIAGNOSIS — I5032 Chronic diastolic (congestive) heart failure: Secondary | ICD-10-CM | POA: Diagnosis not present

## 2018-01-15 DIAGNOSIS — I110 Hypertensive heart disease with heart failure: Secondary | ICD-10-CM | POA: Diagnosis not present

## 2018-01-15 DIAGNOSIS — J449 Chronic obstructive pulmonary disease, unspecified: Secondary | ICD-10-CM | POA: Diagnosis not present

## 2018-01-15 DIAGNOSIS — I2510 Atherosclerotic heart disease of native coronary artery without angina pectoris: Secondary | ICD-10-CM | POA: Diagnosis not present

## 2018-01-15 DIAGNOSIS — I480 Paroxysmal atrial fibrillation: Secondary | ICD-10-CM | POA: Diagnosis not present

## 2018-01-17 ENCOUNTER — Encounter (HOSPITAL_COMMUNITY): Payer: Self-pay | Admitting: *Deleted

## 2018-01-17 ENCOUNTER — Other Ambulatory Visit: Payer: Self-pay

## 2018-01-17 NOTE — Progress Notes (Signed)
Spoke with pt for pre-op call. Pt has hx of CAD with CABG in 2001. Pt also has hx of A-fib, is on Amiodarone and Eliquis. Pt's cardilogist is Dr. Johny Drilling and last visit was in Feb. 2019. Pt denies any recent chest pain or sob. Pt states he's not diabetic.

## 2018-01-17 NOTE — Progress Notes (Signed)
Anesthesia Chart Review:  Pt is a same day work up   Case:  [id number] Date/Time:  01/18/18 1000   Procedure:  MRI WITH ANESTHESIA LUMBAR SPINE WITH AND WITHOUT CONTRAST (Bilateral )   Anesthesia type:  General   Pre-op diagnosis:  BILATERAL LEG WEAKNESS,BACK PAIN   Location:  [place] OR ROOM 41 / [place] OR   Surgeon:  Radiologist, Medication, MD      DISCUSSION: Pt is a 72 year old male with hx CAD (s/p CABG), chronic diastolic HF, PAF, stroke, HTN.    PROVIDERS:  PCP is Cox, Elnita Maxwell, MD Cardiologist is Shirlee More, MD. Last office visit 10/20/17 Neurologist is Antony Contras, MD who ordered MRI. Last office visit 01/01/18  LABS: Will be obtained day of surgery  IMAGES:  CXR 04/02/17:  - Left perihilar interstitial and streaky densities, likely atelectatic changes and less likely mild vascular congestion. Developing infiltrate is less likely. Clinical correlation is recommended.   EKG 10/20/17: sinus bradycardia (52 bpm). Possible inferior infarct, age undetermined. ST and T wave abnormality, consider lateral ischemia. Appears stable when compared to EKG 04/14/17   CV:  Echo 04/03/17:  - Left ventricle: The cavity size was mildly dilated. Systolic function was normal. The estimated ejection fraction was in the range of 55% to 60%. There is akinesis of the inferolateral myocardium. Features are consistent with a pseudonormal left ventricular filling pattern, with concomitant abnormal relaxation and increased filling pressure (grade 2 diastolic dysfunction). - Left atrium: The atrium was mildly dilated. - Pulmonary arteries: Systolic pressure could not be accurately estimated.  Carotid duplex 03/23/17:  - Heterogeneous plaque, bilaterally. - 1-39% bilateral ICA stenosis. - >50% ECA stenosis, bilaterally.  - Elevated velocities with turbulent flow in the right subclavian artery.  - Normal left subclavian artery. - Patent vertebral arteries with antegrade flow  Nuclear stress test 10/06/15  (requested from [facility]): By notes:  - No evidence of ischemia and preserved EF   Past Medical History:  Diagnosis Date  . A-fib ([place])   . Cancer ([place])    skin cancer  . Heart attack ([place])   . Hyperlipidemia   . Hypertension   . Hypothyroidism   . Stroke [facility])     Past Surgical History:  Procedure Laterality Date  . APPENDECTOMY  1956  . CARDIAC CATHETERIZATION    . CHOLECYSTECTOMY  2006  . CORONARY ARTERY BYPASS GRAFT  2001  . TONSILLECTOMY  1975    MEDICATIONS: No current facility-administered medications for this encounter.    Marland Kitchen amiodarone (PACERONE) 200 MG tablet  . apixaban (ELIQUIS) 5 MG TABS tablet  . atorvastatin (LIPITOR) 10 MG tablet  . carvedilol (COREG) 6.25 MG tablet  . cloNIDine (CATAPRES) 0.1 MG tablet  . furosemide (LASIX) 40 MG tablet  . levalbuterol (XOPENEX HFA) 45 MCG/ACT inhaler  . levothyroxine (SYNTHROID, LEVOTHROID) 150 MCG tablet  . nitroGLYCERIN (NITROSTAT) 0.4 MG SL tablet  . potassium chloride (K-DUR,KLOR-CON) 10 MEQ tablet  . valsartan (DIOVAN) 320 MG tablet  . Vitamin D, Ergocalciferol, (DRISDOL) 50000 units CAPS capsule  . zolpidem (AMBIEN) 5 MG tablet  . aspirin EC 81 MG tablet    If labs acceptable day of surgery, I anticipate pt can proceed with surgery as scheduled.  Willeen Cass, FNP-BC [facility] Short Stay Surgical Center/Anesthesiology Phone: [phone] 01/17/2018 12:22 PM

## 2018-01-18 ENCOUNTER — Ambulatory Visit (HOSPITAL_COMMUNITY): Payer: Medicare Other | Admitting: Emergency Medicine

## 2018-01-18 ENCOUNTER — Encounter (HOSPITAL_COMMUNITY): Payer: Self-pay | Admitting: *Deleted

## 2018-01-18 ENCOUNTER — Ambulatory Visit (HOSPITAL_COMMUNITY)
Admission: AD | Admit: 2018-01-18 | Discharge: 2018-01-18 | Disposition: A | Discharge status: Home / Self Care | Payer: Medicare Other | Source: Ambulatory Visit | Attending: Neurology | Admitting: Neurology

## 2018-01-18 ENCOUNTER — Encounter (HOSPITAL_COMMUNITY): Admission: AD | Disposition: A | Discharge status: Home / Self Care | Payer: Self-pay | Source: Ambulatory Visit

## 2018-01-18 ENCOUNTER — Ambulatory Visit (HOSPITAL_COMMUNITY)
Admission: RE | Admit: 2018-01-18 | Discharge: 2018-01-18 | Disposition: A | Discharge status: Home / Self Care | Payer: Medicare Other | Source: Ambulatory Visit | Attending: Neurology | Admitting: Neurology

## 2018-01-18 DIAGNOSIS — R531 Weakness: Secondary | ICD-10-CM | POA: Insufficient documentation

## 2018-01-18 DIAGNOSIS — E039 Hypothyroidism, unspecified: Secondary | ICD-10-CM | POA: Diagnosis not present

## 2018-01-18 DIAGNOSIS — Z79890 Hormone replacement therapy (postmenopausal): Secondary | ICD-10-CM | POA: Diagnosis not present

## 2018-01-18 DIAGNOSIS — I2510 Atherosclerotic heart disease of native coronary artery without angina pectoris: Secondary | ICD-10-CM | POA: Diagnosis not present

## 2018-01-18 DIAGNOSIS — I483 Typical atrial flutter: Secondary | ICD-10-CM | POA: Diagnosis not present

## 2018-01-18 DIAGNOSIS — Z85828 Personal history of other malignant neoplasm of skin: Secondary | ICD-10-CM | POA: Insufficient documentation

## 2018-01-18 DIAGNOSIS — M549 Dorsalgia, unspecified: Secondary | ICD-10-CM | POA: Diagnosis not present

## 2018-01-18 DIAGNOSIS — Z951 Presence of aortocoronary bypass graft: Secondary | ICD-10-CM | POA: Insufficient documentation

## 2018-01-18 DIAGNOSIS — Z7982 Long term (current) use of aspirin: Secondary | ICD-10-CM | POA: Diagnosis not present

## 2018-01-18 DIAGNOSIS — I480 Paroxysmal atrial fibrillation: Secondary | ICD-10-CM | POA: Diagnosis not present

## 2018-01-18 DIAGNOSIS — I110 Hypertensive heart disease with heart failure: Secondary | ICD-10-CM | POA: Insufficient documentation

## 2018-01-18 DIAGNOSIS — Z8673 Personal history of transient ischemic attack (TIA), and cerebral infarction without residual deficits: Secondary | ICD-10-CM | POA: Insufficient documentation

## 2018-01-18 DIAGNOSIS — Z87891 Personal history of nicotine dependence: Secondary | ICD-10-CM | POA: Diagnosis not present

## 2018-01-18 DIAGNOSIS — I5032 Chronic diastolic (congestive) heart failure: Secondary | ICD-10-CM | POA: Insufficient documentation

## 2018-01-18 DIAGNOSIS — Z79899 Other long term (current) drug therapy: Secondary | ICD-10-CM | POA: Diagnosis not present

## 2018-01-18 DIAGNOSIS — Z7901 Long term (current) use of anticoagulants: Secondary | ICD-10-CM | POA: Insufficient documentation

## 2018-01-18 DIAGNOSIS — I252 Old myocardial infarction: Secondary | ICD-10-CM | POA: Insufficient documentation

## 2018-01-18 DIAGNOSIS — M545 Low back pain: Secondary | ICD-10-CM | POA: Diagnosis not present

## 2018-01-18 DIAGNOSIS — R29898 Other symptoms and signs involving the musculoskeletal system: Secondary | ICD-10-CM

## 2018-01-18 DIAGNOSIS — E785 Hyperlipidemia, unspecified: Secondary | ICD-10-CM | POA: Insufficient documentation

## 2018-01-18 HISTORY — PX: RADIOLOGY WITH ANESTHESIA: SHX6223

## 2018-01-18 HISTORY — DX: Hypothyroidism, unspecified: E03.9

## 2018-01-18 HISTORY — DX: Malignant (primary) neoplasm, unspecified: C80.1

## 2018-01-18 LAB — CBC
HCT: 39.3 % (ref 39.0–52.0)
Hemoglobin: 13.6 g/dL (ref 13.0–17.0)
MCH: 31 pg (ref 26.0–34.0)
MCHC: 34.6 g/dL (ref 30.0–36.0)
MCV: 89.5 fL (ref 78.0–100.0)
Platelets: 168 K/uL (ref 150–400)
RBC: 4.39 MIL/uL (ref 4.22–5.81)
RDW: 13.1 % (ref 11.5–15.5)
WBC: 6.1 K/uL (ref 4.0–10.5)

## 2018-01-18 LAB — BASIC METABOLIC PANEL WITH GFR
Anion gap: 8 (ref 5–15)
BUN: 17 mg/dL (ref 6–20)
CO2: 27 mmol/L (ref 22–32)
Calcium: 8.8 mg/dL — ABNORMAL LOW (ref 8.9–10.3)
Chloride: 105 mmol/L (ref 101–111)
Creatinine, Ser: 1.32 mg/dL — ABNORMAL HIGH (ref 0.61–1.24)
GFR calc Af Amer: >60 mL/min
GFR calc non Af Amer: 53 mL/min — ABNORMAL LOW
Glucose, Bld: 102 mg/dL — ABNORMAL HIGH (ref 65–99)
Potassium: 3.7 mmol/L (ref 3.5–5.1)
Sodium: 140 mmol/L (ref 135–145)

## 2018-01-18 SURGERY — MRI WITH ANESTHESIA
Anesthesia: General | Laterality: Bilateral

## 2018-01-18 MED ORDER — LACTATED RINGERS IV SOLN
INTRAVENOUS | Status: DC
  Administered 2018-01-18: 09:00:00 via INTRAVENOUS

## 2018-01-18 MED ORDER — GADOBENATE DIMEGLUMINE 529 MG/ML IV SOLN
20.0000 mL | Freq: Once | INTRAVENOUS | Status: AC | PRN
  Administered 2018-01-18: 20 mL via INTRAVENOUS

## 2018-01-18 NOTE — Transfer of Care (Signed)
Immediate Anesthesia Transfer of Care Note  Patient: John Kemp  Procedure(s) Performed: MRI WITH ANESTHESIA LUMBAR SPINE WITH AND WITHOUT CONTRAST (Bilateral )  Patient Location: PACU  Anesthesia Type:General  Level of Consciousness: awake and patient cooperative  Airway & Oxygen Therapy: Patient Spontanous Breathing  Post-op Assessment: Report given to RN and Post -op Vital signs reviewed and stable  Post vital signs: Reviewed and stable  Last Vitals:  Vitals Value Taken Time  BP 154/72 01/18/2018 12:26 PM  Temp 36.6 C 01/18/2018 12:26 PM  Pulse 63 01/18/2018 12:36 PM  Resp 14 01/18/2018 12:36 PM  SpO2 97 % 01/18/2018 12:36 PM  Vitals shown include unvalidated device data.  Last Pain:  Vitals:   01/18/18 1226  TempSrc:   PainSc: 0-No pain      Patients Stated Pain Goal: 1 (15/40/08 6761)  Complications: No apparent anesthesia complications

## 2018-01-18 NOTE — Anesthesia Postprocedure Evaluation (Signed)
Anesthesia Post Note  Patient: John Kemp  Procedure(s) Performed: MRI WITH ANESTHESIA LUMBAR SPINE WITH AND WITHOUT CONTRAST (Bilateral )     Patient location during evaluation: PACU Anesthesia Type: General Level of consciousness: awake and alert Pain management: pain level controlled Vital Signs Assessment: post-procedure vital signs reviewed and stable Respiratory status: spontaneous breathing, nonlabored ventilation, respiratory function stable and patient connected to nasal cannula oxygen Cardiovascular status: blood pressure returned to baseline and stable Postop Assessment: no apparent nausea or vomiting Anesthetic complications: no    Last Vitals:  Vitals:   01/18/18 1255 01/18/18 1330  BP: 128/63 (!) 141/79  Pulse: (!) 55 (!) 58  Resp: 14 15  Temp:    SpO2: 95% 95%    Last Pain:  Vitals:   01/18/18 1330  TempSrc:   PainSc: 0-No pain                 Marcelyn Ruppe DAVID

## 2018-01-18 NOTE — Anesthesia Procedure Notes (Signed)
Procedure Name: Intubation Date/Time: 01/18/2018 10:52 AM Performed by: Lance Coon, CRNA Pre-anesthesia Checklist: Patient identified, Emergency Drugs available, Suction available, Patient being monitored and Timeout performed Patient Re-evaluated:Patient Re-evaluated prior to induction Oxygen Delivery Method: Circle system utilized Preoxygenation: Pre-oxygenation with 100% oxygen Induction Type: IV induction Ventilation: Mask ventilation without difficulty Laryngoscope Size: Miller and 3 Grade View: Grade II Tube type: Oral Tube size: 7.5 mm Number of attempts: 1 Airway Equipment and Method: Stylet Placement Confirmation: ETT inserted through vocal cords under direct vision,  positive ETCO2 and breath sounds checked- equal and bilateral Secured at: 21 cm Tube secured with: Tape Dental Injury: Teeth and Oropharynx as per pre-operative assessment

## 2018-01-18 NOTE — Anesthesia Preprocedure Evaluation (Signed)
Anesthesia Evaluation  Patient identified by MRN, date of birth, ID band Patient awake    Reviewed: Allergy & Precautions, NPO status , Patient's Chart, lab work & pertinent test results  Airway Mallampati: II  TM Distance: >3 FB Neck ROM: Full    Dental   Pulmonary former smoker,    Pulmonary exam normal        Cardiovascular hypertension, Pt. on medications + CAD, + Past MI and + CABG  Normal cardiovascular exam     Neuro/Psych    GI/Hepatic   Endo/Other    Renal/GU      Musculoskeletal   Abdominal   Peds  Hematology   Anesthesia Other Findings   Reproductive/Obstetrics                             Anesthesia Physical Anesthesia Plan  ASA: III  Anesthesia Plan: General   Post-op Pain Management:    Induction: Intravenous  PONV Risk Score and Plan: 2 and Ondansetron and Treatment may vary due to age or medical condition  Airway Management Planned: LMA  Additional Equipment:   Intra-op Plan:   Post-operative Plan: Extubation in OR  Informed Consent: I have reviewed the patients History and Physical, chart, labs and discussed the procedure including the risks, benefits and alternatives for the proposed anesthesia with the patient or authorized representative who has indicated his/her understanding and acceptance.     Plan Discussed with: CRNA and Surgeon  Anesthesia Plan Comments:         Anesthesia Quick Evaluation

## 2018-01-19 ENCOUNTER — Encounter (HOSPITAL_COMMUNITY): Payer: Self-pay | Admitting: Radiology

## 2018-01-24 MED FILL — Fentanyl Citrate Preservative Free (PF) Inj 100 MCG/2ML: INTRAMUSCULAR | Qty: 2 | Status: AC

## 2018-01-24 MED FILL — Propofol IV Emul 200 MG/20ML (10 MG/ML): INTRAVENOUS | Qty: 20 | Status: AC

## 2018-01-24 MED FILL — Lidocaine HCl Local Soln Prefilled Syringe 100 MG/5ML (2%): INTRAMUSCULAR | Qty: 5 | Status: AC

## 2018-01-24 MED FILL — Ondansetron HCl Inj 4 MG/2ML (2 MG/ML): INTRAMUSCULAR | Qty: 2 | Status: AC

## 2018-01-24 MED FILL — Succinylcholine Chloride Sol Pref Syr 200 MG/10ML (20 MG/ML): INTRAVENOUS | Qty: 10 | Status: AC

## 2018-01-24 MED FILL — Dexamethasone Sodium Phosphate Inj 10 MG/ML: INTRAMUSCULAR | Qty: 1 | Status: AC

## 2018-01-24 MED FILL — Albuterol Sulfate Inhal Aero 108 MCG/ACT (90MCG Base Equiv): RESPIRATORY_TRACT | Qty: 6.7 | Status: AC

## 2018-01-24 MED FILL — Phenylephrine HCl IV Soln Pref Syr 0.4 MG/10ML (40 MCG/ML): INTRAVENOUS | Qty: 10 | Status: AC

## 2018-01-24 MED FILL — Ephedrine Sulf-NaCl Soln Pref Syr 50 MG/10ML-0.9% (5 MG/ML): INTRAVENOUS | Qty: 10 | Status: AC

## 2018-01-30 ENCOUNTER — Telehealth: Payer: Self-pay | Admitting: Neurology

## 2018-01-30 NOTE — Telephone Encounter (Signed)
Pt requesting a call to go over his MRI results.

## 2018-01-30 NOTE — Telephone Encounter (Signed)
See my result note from MRI. No evidence of pinched nerve or spinal stenosis or compression

## 2018-01-30 NOTE — Telephone Encounter (Signed)
Notes recorded by Garvin Fila, MD on 01/30/2018 at 1:22 PM EDT Kindly inform the patient that MRI scan of the lumbar spine showed no significant nerve compression or spinal stenosis. No worrisome finding

## 2018-01-30 NOTE — Telephone Encounter (Signed)
LEft vm for patient to call back during business hours for results.

## 2018-01-31 ENCOUNTER — Other Ambulatory Visit: Payer: Self-pay | Admitting: Neurology

## 2018-01-31 DIAGNOSIS — G459 Transient cerebral ischemic attack, unspecified: Secondary | ICD-10-CM

## 2018-01-31 DIAGNOSIS — R29898 Other symptoms and signs involving the musculoskeletal system: Secondary | ICD-10-CM

## 2018-01-31 NOTE — Telephone Encounter (Signed)
Pt returning RN's call.

## 2018-01-31 NOTE — Telephone Encounter (Signed)
I spoke to the patient who is still having intermittent episodes of his leg giving out and dragging.  MRI scan of the lumbar spine shows no evidence of spinal stenosis.  I recommend further evaluation by checking CT angiogram of the brain and neck to rule out significant extra or intracranial stenosis as well as EMG nerve conduction study to look for evidence of neuropathy or radiculopathy.  He voiced understanding.  I placed the orders in epic.

## 2018-01-31 NOTE — Telephone Encounter (Signed)
Advised patient of MRI results per previous message but patient would like a call back to discuss what is next.  He can be reached at [phone].

## 2018-02-01 ENCOUNTER — Telehealth: Payer: Self-pay | Admitting: Neurology

## 2018-02-01 NOTE — Telephone Encounter (Signed)
Medicare/BCBS supp order sent to GI. They will reach out to the pt to schedule.  °

## 2018-02-05 ENCOUNTER — Telehealth: Payer: Self-pay | Admitting: *Deleted

## 2018-02-05 NOTE — Telephone Encounter (Signed)
Called, LVM for pt letting him know I was calling since he was on wait list for EMG/NCS if anyone cx. Dr. Jannifer Franklin had someone cx tomorrow at 1015am. He would need to check in 945am. Asked him to call back asap to let me know if he can come in.

## 2018-02-05 NOTE — Telephone Encounter (Signed)
Patient's wife called and advised patient cannot make the appointment that was offered for tomorrow but please keep him on the waiting list.

## 2018-02-05 NOTE — Telephone Encounter (Signed)
Noted, thank you

## 2018-02-07 ENCOUNTER — Ambulatory Visit
Admission: RE | Admit: 2018-02-07 | Discharge: 2018-02-07 | Disposition: A | Discharge status: Home / Self Care | Payer: Medicare Other | Source: Ambulatory Visit | Attending: Neurology | Admitting: Neurology

## 2018-02-07 DIAGNOSIS — G459 Transient cerebral ischemic attack, unspecified: Secondary | ICD-10-CM

## 2018-02-07 DIAGNOSIS — I6523 Occlusion and stenosis of bilateral carotid arteries: Secondary | ICD-10-CM | POA: Diagnosis not present

## 2018-02-07 MED ORDER — IOPAMIDOL (ISOVUE-370) INJECTION 76%
75.0000 mL | Freq: Once | INTRAVENOUS | Status: AC | PRN
  Administered 2018-02-07: 75 mL via INTRAVENOUS

## 2018-02-08 ENCOUNTER — Telehealth: Payer: Self-pay

## 2018-02-08 NOTE — Telephone Encounter (Signed)
-----   Message from Garvin Fila, MD sent at 02/07/2018  3:49 PM EDT ----- Mitchell Heir inform the patient that CT angiogram of the neck and the brain shows only minor narrowing without significant blockage. No worrisome finding.

## 2018-02-08 NOTE — Telephone Encounter (Signed)
Notes recorded by Marval Regal, RN on 02/08/2018 at 10:41 AM EDT Left vm for patient to call back about CT neck and brain results.

## 2018-02-08 NOTE — Telephone Encounter (Signed)
Notes recorded by Marval Regal, RN on 02/08/2018 at 11:08 AM EDT Rn receive call from patient about Ct neck and Ct head. Rn stated the Ct angiogram of neck shows only minor narrowing without significant blockage. No worrisome findings. PT verbalized understanding. ------

## 2018-03-21 ENCOUNTER — Ambulatory Visit (INDEPENDENT_AMBULATORY_CARE_PROVIDER_SITE_OTHER): Payer: Medicare Other | Admitting: Neurology

## 2018-03-21 ENCOUNTER — Encounter

## 2018-03-21 ENCOUNTER — Telehealth: Payer: Self-pay | Admitting: *Deleted

## 2018-03-21 ENCOUNTER — Encounter: Payer: Self-pay | Admitting: Neurology

## 2018-03-21 DIAGNOSIS — R29898 Other symptoms and signs involving the musculoskeletal system: Secondary | ICD-10-CM | POA: Diagnosis not present

## 2018-03-21 DIAGNOSIS — I639 Cerebral infarction, unspecified: Secondary | ICD-10-CM

## 2018-03-21 NOTE — Progress Notes (Signed)
Please refer to EMG and nerve conduction study procedure note. 

## 2018-03-21 NOTE — Progress Notes (Signed)
[facility]    Nerve / Sites Muscle Latency Ref. Amplitude Ref. Rel Amp Segments Distance Velocity Ref. Area    ms ms mV mV %  cm m/s m/s mVms  R Peroneal - EDB     Ankle EDB 4.3 ?6.5 2.9 ?2.0 100 Ankle - EDB 9   7.8     Fib head EDB 10.4  3.1  105 Fib head - Ankle 30 50 ?44 8.5     Pop fossa EDB 12.9  3.0  96.5 Pop fossa - Fib head 12 48 ?44 8.1         Pop fossa - Ankle      L Peroneal - EDB     Ankle EDB 4.4 ?6.5 3.7 ?2.0 100 Ankle - EDB 9   10.0     Fib head EDB 10.3  3.3  89 Fib head - Ankle 30 51 ?44 9.1     Pop fossa EDB 12.6  3.6  109 Pop fossa - Fib head 12 52 ?44 10.5         Pop fossa - Ankle      R Tibial - AH     Ankle AH 4.2 ?5.8 4.7 ?4.0 100 Ankle - AH 9   14.6     Pop fossa AH 12.8  4.4  93.2 Pop fossa - Ankle 37 43 ?41 15.6  L Tibial - AH     Ankle AH 4.5 ?5.8 6.0 ?4.0 100 Ankle - AH 9   13.2     Pop fossa AH 13.3  5.1  84.4 Pop fossa - Ankle 37 42 ?41 15.1             SNC    Nerve / Sites Rec. Site Peak Lat Ref.  Amp Ref. Segments Distance    ms ms V V  cm  L Sural - Ankle (Calf)     Calf Ankle 3.2 ?4.4 6 ?6 Calf - Ankle 14  R Sural - Ankle (Calf)     Calf Ankle 3.1 ?4.4 7 ?6 Calf - Ankle 14  R Superficial peroneal - Ankle     Lat leg Ankle 4.1 ?4.4 5 ?6 Lat leg - Ankle 14  L Superficial peroneal - Ankle     Lat leg Ankle 4.0 ?4.4 6 ?6 Lat leg - Ankle 14              F  Wave    Nerve F Lat Ref.   ms ms  R Tibial - AH 51.0 ?56.0  L Tibial - AH 51.3 ?56.0

## 2018-03-21 NOTE — Telephone Encounter (Signed)
RN call patient back about labs not cover by his insurance. RN ask if he had a denial letter. The pt stated he call both insurances,and [facility] [employer], and got no help. PT states medicare is his primary insurance,and BCBS is the secondary. Neither insurance will not cover. RN stated an appeal letter can be done,but Dr. Leonie Man would need to know why the labs he order were not cover. The pt will call his insurance, and [facility] [employer] again to find out the reason for the denial,and call us back.

## 2018-03-21 NOTE — Procedures (Signed)
     HISTORY:  John Kemp is a 72 year old gentleman with a history of fatigable weakness of both lower extremities with walking.  Walking 120 feet will be long enough to make him feel as if his legs will collapse.  He denies shortness of breath or pain in the legs.  He is being evaluated for possible neuropathy or a myopathic disorder.  NERVE CONDUCTION STUDIES:  Nerve conduction studies were performed on both lower extremities. The distal motor latencies and motor amplitudes for the peroneal and posterior tibial nerves were within normal limits. The nerve conduction velocities for these nerves were also normal. The sensory latencies for the peroneal and sural nerves were within normal limits. The F wave latencies for the posterior tibial nerves were within normal limits.   EMG STUDIES:  EMG study was performed on the left lower extremity:  The tibialis anterior muscle reveals 2 to 4K motor units with full recruitment. No fibrillations or positive waves were seen. The peroneus tertius muscle reveals 2 to 4K motor units with full recruitment. No fibrillations or positive waves were seen. The medial gastrocnemius muscle reveals 1 to 3K motor units with full recruitment. No fibrillations or positive waves were seen. The vastus lateralis muscle reveals 2 to 4K motor units with full recruitment. No fibrillations or positive waves were seen. The iliopsoas muscle reveals 2 to 4K motor units with full recruitment. No fibrillations or positive waves were seen. The biceps femoris muscle (long head) reveals 2 to 4K motor units with full recruitment. No fibrillations or positive waves were seen. The lumbosacral paraspinal muscles were tested at 3 levels, and revealed no abnormalities of insertional activity at all 3 levels tested. There was good relaxation.   IMPRESSION:  Nerve conduction studies done on both lower extremities were within normal limits.  No evidence of a neuropathy is seen.  EMG  evaluation of the left lower extremity was unremarkable without evidence of an overlying lumbosacral radiculopathy or any evidence of a myopathic disorder.  Jill Alexanders MD 03/21/2018 2:41 PM  [facility] [address] [place], [place] [address]  Phone [phone] Fax [phone]

## 2018-03-21 NOTE — Telephone Encounter (Signed)
Patient states he had some lab work done in our office and for some reason his insurance did not pay for it.   Wonders if there is anything we could do to to appeal it?  Please call.

## 2018-03-22 ENCOUNTER — Telehealth: Payer: Self-pay

## 2018-03-22 NOTE — Telephone Encounter (Signed)
-----   Message from Garvin Fila, MD sent at 03/21/2018  4:33 PM EDT ----- Mitchell Heir advise the patient had EMG nerve conduction study was normal

## 2018-03-22 NOTE — Progress Notes (Signed)
I agree with the above plan 

## 2018-03-22 NOTE — Telephone Encounter (Signed)
Notes recorded by Marval Regal, RN on 03/22/2018 at 9:26 AM EDT RN call patient that the EMG was normal. PT verbalized understanding. ------

## 2018-04-09 NOTE — Telephone Encounter (Addendum)
RN call patient about bill. RN receive bill for [employer]. RN stated in the future he has to filed a appeal ,and the MD can write a letter to say why it was medically necessary for the labs to be done. Rn stated medicare provider line,and [employer] was call. Rn per Remo Lipps at [employer] his total bill was 1353.00, medicare paid 71.73 [employer] did not pay anything. THey stated the labs were not covered because it did not show a medical necessity.Rn stated there was also a adjustment of 733..51 of our office being in network. Rn stated to pt he has to call medicare at [place], [phone], and file an appeal. Rn stated the MD can write a letter for the appeal, when he gets the paperwork. Rn stated the final decision will be determine by medicare not our office. Rn advised pt in the future he has to call [employer] and get more accurate information its the responsibility of the pt. Pt stated he call and requested an appeal in March but did not get the paper work.RN stated its the responsibility of the pt to file appeal when pt is disputing a bill not [employer]. PT verbalized understanding. Rn stated our office does not mind helping out and assisting if we can.PT verbalized understanding.

## 2018-04-10 NOTE — Telephone Encounter (Signed)
Pt came today to get [employer], and medical bill for nurse to review. Pt will call medicare to file an appeal to see if they will cover any more of the 547.76 payment he owes. Pt will let us know what is needed for the MD for the medical necessity. RN gave all of his documents today at check in. Jael at front desk is aware of this.

## 2018-04-11 DIAGNOSIS — J018 Other acute sinusitis: Secondary | ICD-10-CM | POA: Diagnosis not present

## 2018-04-13 DIAGNOSIS — J209 Acute bronchitis, unspecified: Secondary | ICD-10-CM | POA: Diagnosis not present

## 2018-04-18 DIAGNOSIS — Z0000 Encounter for general adult medical examination without abnormal findings: Secondary | ICD-10-CM | POA: Diagnosis not present

## 2018-04-18 DIAGNOSIS — R7303 Prediabetes: Secondary | ICD-10-CM | POA: Diagnosis not present

## 2018-04-18 DIAGNOSIS — R2681 Unsteadiness on feet: Secondary | ICD-10-CM | POA: Diagnosis not present

## 2018-04-18 DIAGNOSIS — Z6838 Body mass index (BMI) 38.0-38.9, adult: Secondary | ICD-10-CM | POA: Diagnosis not present

## 2018-04-18 DIAGNOSIS — Z125 Encounter for screening for malignant neoplasm of prostate: Secondary | ICD-10-CM | POA: Diagnosis not present

## 2018-04-18 DIAGNOSIS — I10 Essential (primary) hypertension: Secondary | ICD-10-CM | POA: Diagnosis not present

## 2018-04-18 DIAGNOSIS — J018 Other acute sinusitis: Secondary | ICD-10-CM | POA: Diagnosis not present

## 2018-04-18 DIAGNOSIS — E782 Mixed hyperlipidemia: Secondary | ICD-10-CM | POA: Diagnosis not present

## 2018-04-18 DIAGNOSIS — E034 Atrophy of thyroid (acquired): Secondary | ICD-10-CM | POA: Diagnosis not present

## 2018-04-23 DIAGNOSIS — R413 Other amnesia: Secondary | ICD-10-CM | POA: Diagnosis not present

## 2018-04-23 NOTE — Progress Notes (Signed)
Cardiology Office Note:    Date:  04/24/2018   ID:  [name], DOB 1946/08/18, MRN [id number]  PCP:  [name], MD  Cardiologist:  Shirlee More, MD    Referring MD: [name], MD    ASSESSMENT:    1. Chronic diastolic heart failure ([place])   2. Hypertensive heart disease with chronic diastolic congestive heart failure ([place])   3. PAF (paroxysmal atrial fibrillation) ([place])   4. On amiodarone therapy   5. Anticoagulated   6. CAD in native artery   7. Pure hypercholesterolemia    PLAN:    In order of problems listed above:  1. Stable compensated continue his loop diuretic sodium restriction daily weights and asked him to use a pillbox at home. 2. Stable blood pressure target continue current antihypertensives 3. Stable maintain sinus rhythm continue low-dose amiodarone recent chest x-ray labs requested to screen for liver thyroid and lung toxicity I will ask his PCP to continue to check liver function and thyroid every 6 months 4. Await recent labs check EKG today on amiodarone for rhythm QT interval 5. Stable having no anginal discomfort at this time I do not think he requires an ischemia evaluation 6.  await recent labs done for liver and lipid. 7. I encouraged him to increase his activities   Next appointment: 6 months   Medication Adjustments/Labs and Tests Ordered: Current medicines are reviewed at length with the patient today.  Concerns regarding medicines are outlined above.  No orders of the defined types were placed in this encounter.  No orders of the defined types were placed in this encounter.   Chief Complaint  Patient presents with  . Follow-up  . Congestive Heart Failure  . Atrial Fibrillation    on amiodarone  . Anticoagulation  . Coronary Artery Disease    History of Present Illness:    Malvern Kadlec is a 72 y.o. male with a hx of CAD, diastolic CHF, Atrial Fibrillation, HTN, S/P CABG and on amiodoarone  and a stroke with visual  loss  last seen 10/20/17. Compliance with diet, lifestyle and medications: yes, synthroid dose decreasedf after recent labs, CXR done 2 weeks ago at [facility]  He is sedentary and has difficulty ambulating since stroke but has had no shortness of breath orthopnea edema chest pain palpitations syncope or bleeding complication his anticoagulant.  Weight is stable at home he sodium restricts but does not use a pillbox.  Recently seen at [facility] urgent care with upper respiratory infection resolved he had a chest x-ray done labs done last week with his PCP his thyroid dose was adjusted down I requested copies of both of those his family relates he had liver function checked with amiodarone. Past Medical History:  Diagnosis Date  . A-fib ([place])   . Cancer ([place])    skin cancer  . Heart attack ([place])   . Hyperlipidemia   . Hypertension   . Hypothyroidism   . Stroke [facility])     Past Surgical History:  Procedure Laterality Date  . APPENDECTOMY  1956  . CARDIAC CATHETERIZATION    . CHOLECYSTECTOMY  2006  . CORONARY ARTERY BYPASS GRAFT  2001  . RADIOLOGY WITH ANESTHESIA Bilateral 01/18/2018   Procedure: MRI WITH ANESTHESIA LUMBAR SPINE WITH AND WITHOUT CONTRAST;  Surgeon: Radiologist, Medication, MD;  Location: [facility];  Service: Radiology;  Laterality: Bilateral;  . TONSILLECTOMY  1975    Current Medications: Current Meds  Medication Sig  . amiodarone (PACERONE) 200 MG  tablet Take 1 tablet (200 mg total) by mouth daily.  Marland Kitchen amLODipine (NORVASC) 5 MG tablet Take 5 mg by mouth daily.  Marland Kitchen apixaban (ELIQUIS) 5 MG TABS tablet Take 5 mg by mouth 2 (two) times daily.  Marland Kitchen aspirin EC 81 MG tablet Take 1 tablet (81 mg total) by mouth daily.  Marland Kitchen atorvastatin (LIPITOR) 10 MG tablet Take 10 mg by mouth at bedtime.  . carvedilol (COREG) 6.25 MG tablet Take 6.25 mg by mouth 2 (two) times daily.  . cloNIDine (CATAPRES) 0.1 MG tablet Take 0.1 mg by mouth 2 (two) times daily as needed (FOR BLOOD PRESSURE GREATER THAN  140/80).   . furosemide (LASIX) 40 MG tablet Take 40 mg by mouth daily as needed for fluid.   Marland Kitchen levalbuterol (XOPENEX HFA) 45 MCG/ACT inhaler Inhale 2 puffs into the lungs every 4 (four) hours as needed for wheezing or shortness of breath.  . levothyroxine (SYNTHROID, LEVOTHROID) 137 MCG tablet Take 137 mcg by mouth daily before breakfast.   . nitroGLYCERIN (NITROSTAT) 0.4 MG SL tablet Place 1 tablet (0.4 mg total) under the tongue every 5 (five) minutes as needed for chest pain.  . potassium chloride (K-DUR,KLOR-CON) 10 MEQ tablet Take 10 mEq by mouth daily as needed (for fluid retention.).   Marland Kitchen valsartan (DIOVAN) 320 MG tablet Take 320 mg by mouth daily.  . Vitamin D, Ergocalciferol, (DRISDOL) 50000 units CAPS capsule Take 50,000 Units by mouth every Saturday. MORNING.  Marland Kitchen zolpidem (AMBIEN) 5 MG tablet Take 5 mg by mouth at bedtime.      Allergies:   Prednisone   Social History   Socioeconomic History  . Marital status: Married    Spouse name: Not on file  . Number of children: Not on file  . Years of education: Not on file  . Highest education level: Not on file  Occupational History  . Occupation: Retired  Scientific laboratory technician  . Financial resource strain: Not on file  . Food insecurity:    Worry: Not on file    Inability: Not on file  . Transportation needs:    Medical: Not on file    Non-medical: Not on file  Tobacco Use  . Smoking status: Former Smoker    Packs/day: 1.00    Years: 15.00    Pack years: 15.00    Types: Cigarettes    Last attempt to quit: 08/29/1978    Years since quitting: 39.6  . Smokeless tobacco: Never Used  Substance and Sexual Activity  . Alcohol use: No    Alcohol/week: 0.0 standard drinks  . Drug use: No  . Sexual activity: Not on file  Lifestyle  . Physical activity:    Days per week: Not on file    Minutes per session: Not on file  . Stress: Not on file  Relationships  . Social connections:    Talks on phone: Not on file    Gets together: Not on  file    Attends religious service: Not on file    Active member of club or organization: Not on file    Attends meetings of clubs or organizations: Not on file    Relationship status: Not on file  Other Topics Concern  . Not on file  Social History Narrative  . Not on file     Family History: The patient's family history includes Breast cancer in his mother; Heart disease in his father. ROS:  Cough recent URI seen at urgent care Please see the history of  present illness.    All other systems reviewed and are negative.  EKGs/Labs/Other Studies Reviewed:    The following studies were reviewed today:  EKG:  EKG ordered today.  The ekg ordered today demonstrates stable pattern sinus bradycardia 53 BPm QTc 459  Recent Labs: Recent labs received from his PCP CBC normal CMP normal LDL 89 TSH diminished 0.125 consistent with excessive thyroid supplementation hemoglobin A1c 5.2% 10/23/2017: TSH 0.843 01/18/2018: BUN 17; Creatinine, Ser 1.32; Hemoglobin 13.6; Platelets 168; Potassium 3.7; Sodium 140  Recent Lipid Panel    Component Value Date/Time   CHOL 184 04/02/2017 0213   TRIG 176 (H) 04/02/2017 0213   HDL 31 (L) 04/02/2017 0213   CHOLHDL 5.9 04/02/2017 0213   VLDL 35 04/02/2017 0213   LDLCALC 118 (H) 04/02/2017 0213    Physical Exam:    VS:  BP 124/70 (BP Location: Right Arm, Patient Position: Sitting, Cuff Size: Large)   Pulse (!) 53   Ht 5\' 10"  (1.778 m)   Wt 254 lb 9.6 oz (115.5 kg)   SpO2 96%   BMI 36.53 kg/m     Wt Readings from Last 3 Encounters:  04/24/18 254 lb 9.6 oz (115.5 kg)  01/18/18 264 lb (119.7 kg)  01/01/18 259 lb (117.5 kg)     GEN: Obese  in no acute distress HEENT: Normal NECK: No JVD; No carotid bruits LYMPHATICS: No lymphadenopathy CARDIAC: RRR, no murmurs, rubs, gallops RESPIRATORY:  Clear to auscultation without rales, wheezing or rhonchi  ABDOMEN: Soft, non-tender, non-distended  MUSCULOSKELETAL:  No edema; No deformity  SKIN: Warm and  dry NEUROLOGIC:  Alert and oriented x 3 PSYCHIATRIC:  Normal affect    Signed, Shirlee More, MD  04/24/2018 8:40 AM    [facility]

## 2018-04-24 ENCOUNTER — Encounter: Payer: Self-pay | Admitting: Emergency Medicine

## 2018-04-24 ENCOUNTER — Encounter: Payer: Self-pay | Admitting: Cardiology

## 2018-04-24 ENCOUNTER — Ambulatory Visit (INDEPENDENT_AMBULATORY_CARE_PROVIDER_SITE_OTHER): Payer: Medicare Other | Admitting: Cardiology

## 2018-04-24 VITALS — BP 124/70 | HR 53 | Ht 70.0 in | Wt 254.6 lb

## 2018-04-24 DIAGNOSIS — Z79899 Other long term (current) drug therapy: Secondary | ICD-10-CM | POA: Diagnosis not present

## 2018-04-24 DIAGNOSIS — E039 Hypothyroidism, unspecified: Secondary | ICD-10-CM | POA: Insufficient documentation

## 2018-04-24 DIAGNOSIS — I5032 Chronic diastolic (congestive) heart failure: Secondary | ICD-10-CM | POA: Diagnosis not present

## 2018-04-24 DIAGNOSIS — I2510 Atherosclerotic heart disease of native coronary artery without angina pectoris: Secondary | ICD-10-CM

## 2018-04-24 DIAGNOSIS — I480 Paroxysmal atrial fibrillation: Secondary | ICD-10-CM

## 2018-04-24 DIAGNOSIS — Z7901 Long term (current) use of anticoagulants: Secondary | ICD-10-CM | POA: Diagnosis not present

## 2018-04-24 DIAGNOSIS — I110 Hypertensive heart disease with heart failure: Secondary | ICD-10-CM | POA: Diagnosis not present

## 2018-04-24 HISTORY — DX: Hypothyroidism, unspecified: E03.9

## 2018-04-24 NOTE — Patient Instructions (Addendum)
Medication Instructions:  Your physician recommends that you continue on your current medications as directed. Please refer to the Current Medication list given to you today.   Labwork: None  Testing/Procedures: None  Follow-Up: Your physician wants you to follow-up in: 6 months. You will receive a reminder letter in the mail two months in advance. If you don't receive a letter, please call our office to schedule the follow-up appointment.   Any Other Special Instructions Will Be Listed Below (If Applicable).     If you need a refill on your cardiac medications before your next appointment, please call your pharmacy.    Heart Failure  Weigh yourself every morning when you first wake up and record on a calender or note pad, bring this to your office visits. Using a pill tender can help with taking your medications consistently.  Limit your fluid intake to 2 liters daily  Limit your sodium intake to less than 2-3 grams daily. Ask if you need dietary teaching.  If you gain more than 3 pounds (from your dry weight ), double your dose of diuretic for the day.  If you gain more than 5 pounds (from your dry weight), double your dose of lasix and call your heart failure doctor.  Please do not smoke tobacco since it is very bad for your heart.  Please do not drink alcohol since it can worsen your heart failure.Also avoid OTC nonsteroidal drugs, such as advil, aleve and motrin.  Try to exercise for at least 30 minutes every day because this will help your heart be more efficient. You may be eligible for supervised cardiac rehab, ask your physician.     

## 2018-04-27 ENCOUNTER — Telehealth: Payer: Self-pay

## 2018-04-27 NOTE — Telephone Encounter (Signed)
Revised. 

## 2018-04-27 NOTE — Telephone Encounter (Signed)
LEft vm for patient that the Ct scan report was receive to our office.Rn stated the CT scan was order by Darrol Jump PA. Rn left vm if he just wanted Dr Leonie Man to have a copy of the report.

## 2018-05-01 NOTE — Telephone Encounter (Signed)
Rn spoke with patient. He stated the PA who order the scan gave him the results already. Pt was told by Darrol Jump PA that the Ct showed a old stroke,and he has mild shrinkage of the brain.Pt stated his PCP told him to take B12.

## 2018-05-03 NOTE — Telephone Encounter (Signed)
Dr. Leonie Man reviewed Ct scan order by the PA at his PCP office. Report sent to medical records. The scan done at [facility].

## 2018-05-07 ENCOUNTER — Telehealth: Payer: Self-pay

## 2018-05-07 MED ORDER — AMIODARONE HCL 200 MG PO TABS: 200.0000 mg | ORAL_TABLET | Freq: Every day | ORAL | 3 refills | Status: DC

## 2018-05-07 NOTE — Telephone Encounter (Signed)
Rx sent to pharmacy as requested.

## 2018-05-14 DIAGNOSIS — J441 Chronic obstructive pulmonary disease with (acute) exacerbation: Secondary | ICD-10-CM | POA: Diagnosis not present

## 2018-05-28 DIAGNOSIS — Z23 Encounter for immunization: Secondary | ICD-10-CM | POA: Diagnosis not present

## 2018-06-20 ENCOUNTER — Ambulatory Visit (INDEPENDENT_AMBULATORY_CARE_PROVIDER_SITE_OTHER): Payer: Medicare Other | Admitting: Neurology

## 2018-06-20 ENCOUNTER — Encounter: Payer: Self-pay | Admitting: Neurology

## 2018-06-20 VITALS — BP 123/69 | HR 50 | Wt 258.0 lb

## 2018-06-20 DIAGNOSIS — I2510 Atherosclerotic heart disease of native coronary artery without angina pectoris: Secondary | ICD-10-CM

## 2018-06-20 DIAGNOSIS — G3184 Mild cognitive impairment, so stated: Secondary | ICD-10-CM | POA: Diagnosis not present

## 2018-06-20 MED ORDER — DONEPEZIL HCL 10 MG PO TABS: 10.0000 mg | ORAL_TABLET | Freq: Every day | ORAL | 3 refills | Status: DC

## 2018-06-20 NOTE — Patient Instructions (Signed)
I had a long discussion with the patient and his wife regarding his recent cognitive and memory difficulties which likely represent mild cognitive impairment.  I recommend trial of Aricept 5 mg daily for 1 month to be increased to 10 mg daily if tolerated.  I have discussed side effects with them and asked him to call me if needed.  The patient's daughters sent me an article regarding normal pressure hydrocephalus but patient's clinical picture is not quite typical.  He had a CT scan of the head on 04/23/2018 which does not show normal pressure hydrocephalus.  Moreover to do a diagnostic spinal tap to diagnose this condition he may have to come off the Eliquis for 3 days and patient appears not to be willing to do this at the present time.  He will return for follow-up in 3 months or call earlier if necessary.

## 2018-06-20 NOTE — Progress Notes (Signed)
[facility] [address] [place]. [place] [address] [phone]       OFFICE FOLLOW UP VISIT  NOTE  [name] Date of Birth:  10-05-1945 Medical Record Number:  [id number]   Referring MD: Darrol Jump, PA-C Reason for Referral:  Leg weakness  HPI: Initial consult 10/23/17 :  [name] is a 72 year old Caucasian male who is accompanied today by his wife. History is obtained from them as well as review of electronic medical records in care everywhere. I was unable personally to look at imaging films. He states that for the last 6-8 months from summer of 2018 he noticed that he is having some leg weakness. Is able to get out of a chair and walk fairly well but after a while he feels his legs are going to give out and feel heavy and has to sit down. He feels better after a while. He denies any fall injury or back pain. He denies radicular pain and tingling numbness or burning in his feet. He was seen by her ophthalmologist who diagnosed him to have right superior quadrantanopsia and he was referred to [facility]. He had an MRI scan of the brain done on 10/24/16  And I have reviewed the report which shows no acute infarct but changes of small vessel disease. He subsequently saw her neurologist Dr. [name] Heck in [place] on 02/06/17 who recommended he continue eliquis for his atrial fibrillation and risk factor modification. Patient denies any other symptoms suggestive of stroke TIA. He does feel his balance is off. He does trip easily and has fallen a couple of times in the last 6 months. He however denies any pain burning or tingling in his feet. He has history of atrial fibrillation and has been cardioverted 3 times. Is currently on eliquis. He does also noticed that his short-term memory is getting worse. He has trouble remembering his appointments. He also is unable to do activities like mowing the yard which he previously is to do. He has not had any evaluation for  neuropathy. Update 01/01/2018 : he returns for follow-up after last visit 2 months ago. He is accompanied by his wife. He continues to have intermittent leg weakness and heaviness with walking a lot 100 - 150 feet. He denies any back pain, radicular pain, tingling numbness or burning in his feet. The patient did undergo lab work on 10/23/17 that showed normal vitamin B12, TSH and peripheral neuropathy labs except for low vitamin D. He has been started on vitamin D replacement by his primary physician for the last 6 weeks but has not noticed an improvement in his leg weakness and walking. A milder level was normal. Patient had EMG nerve conduction study done 11/15/17 which showed no evidence of neuropathy or radiculopathy. She states she has good days and bad days but mostly bad days. He feels that his leg gets heavy and starts dragging particularly his right leg. He has not had any falls or injuries. The patient states he did have peripheral arterial Dopplers done by his cardiologist Dr. Bettina Gavia a year ago and it was normal. Patient is extremely claustrophobic and states he'll undergo MRI of the lumbar spine only if it is under general anesthesia. Update 06/20/2018: He returns for follow-up after last visit 5-1/2 months ago.  He is accompanied by his wife.  He continues to have gait difficulties which he says are unchanged.  He can walk most times fairly well and has no trouble getting out of  a chair or initiating his gait.  After having walked about 100 feet or so he feels his legs are getting heavy and he is to sit down.  After a few minutes of rest he can get up and walk again.  He denies any gait festination or apraxia of stooped posture.  He and his wife expressed concerns about his short-term memory and cognition.  In recent months they have noticed he has trouble keeping up with his appointments and remembering things.  His wife has to remember things for him.  He has also given up driving and drives very little  only short distances.  He denies any bladder urgency, incontinence or dysuria.  He underwent EMG nerve conduction study on 03/21/2018 which was normal.  CT angiogram of the brain and neck on 02/07/2018 showed no significant intracranial or extracranial stenosis.  MRI scan of the  lumbar spine on 01/18/2018 showed no significant degenerative changes, spinal stenosis or disc herniation.  He had a repeat CT scan of the head on 04/23/2018 at [facility] which have personally reviewed showed a small old left occipital infarct and mild generalized atrophy without significant dilatation of the ventricles. ROS:   14 system review of systems is positive for   Walking difficulty and leg weakness, daytime sleepiness, memory loss, easy bruising and bleeding only and all other systems negative  PMH:  Past Medical History:  Diagnosis Date  . A-fib ([place])   . Cancer ([place])    skin cancer  . Heart attack ([place])   . Hyperlipidemia   . Hypertension   . Hypothyroidism   . Stroke [facility])     Social History:  Social History   Socioeconomic History  . Marital status: Married    Spouse name: Not on file  . Number of children: Not on file  . Years of education: Not on file  . Highest education level: Not on file  Occupational History  . Occupation: Retired  Scientific laboratory technician  . Financial resource strain: Not on file  . Food insecurity:    Worry: Not on file    Inability: Not on file  . Transportation needs:    Medical: Not on file    Non-medical: Not on file  Tobacco Use  . Smoking status: Former Smoker    Packs/day: 1.00    Years: 15.00    Pack years: 15.00    Types: Cigarettes    Last attempt to quit: 08/29/1978    Years since quitting: 39.8  . Smokeless tobacco: Never Used  Substance and Sexual Activity  . Alcohol use: No    Alcohol/week: 0.0 standard drinks  . Drug use: No  . Sexual activity: Not on file  Lifestyle  . Physical activity:    Days per week: Not on file    Minutes per session: Not  on file  . Stress: Not on file  Relationships  . Social connections:    Talks on phone: Not on file    Gets together: Not on file    Attends religious service: Not on file    Active member of club or organization: Not on file    Attends meetings of clubs or organizations: Not on file    Relationship status: Not on file  . Intimate partner violence:    Fear of current or ex partner: Not on file    Emotionally abused: Not on file    Physically abused: Not on file    Forced sexual activity: Not on file  Other Topics Concern  . Not on file  Social History Narrative  . Not on file    Medications:   Current Outpatient Medications on File Prior to Visit  Medication Sig Dispense Refill  . amiodarone (PACERONE) 200 MG tablet Take 1 tablet (200 mg total) by mouth daily. 90 tablet 3  . amLODipine (NORVASC) 5 MG tablet Take 5 mg by mouth daily.  2  . apixaban (ELIQUIS) 5 MG TABS tablet Take 5 mg by mouth 2 (two) times daily.    Marland Kitchen atorvastatin (LIPITOR) 10 MG tablet Take 10 mg by mouth at bedtime.    . carvedilol (COREG) 6.25 MG tablet Take 6.25 mg by mouth 2 (two) times daily.    . cloNIDine (CATAPRES) 0.1 MG tablet Take 0.1 mg by mouth 2 (two) times daily as needed (FOR BLOOD PRESSURE GREATER THAN 140/80).     . furosemide (LASIX) 40 MG tablet Take 40 mg by mouth daily as needed for fluid.     Marland Kitchen levalbuterol (XOPENEX HFA) 45 MCG/ACT inhaler Inhale 2 puffs into the lungs every 4 (four) hours as needed for wheezing or shortness of breath.    . levothyroxine (SYNTHROID, LEVOTHROID) 137 MCG tablet Take 137 mcg by mouth daily before breakfast.     . nitroGLYCERIN (NITROSTAT) 0.4 MG SL tablet Place 1 tablet (0.4 mg total) under the tongue every 5 (five) minutes as needed for chest pain. 25 tablet 11  . potassium chloride (K-DUR,KLOR-CON) 10 MEQ tablet Take 10 mEq by mouth daily as needed (for fluid retention.).     Marland Kitchen valsartan (DIOVAN) 320 MG tablet Take 320 mg by mouth daily.    . Vitamin D,  Ergocalciferol, (DRISDOL) 50000 units CAPS capsule Take 50,000 Units by mouth every Saturday. MORNING.    Marland Kitchen zolpidem (AMBIEN) 5 MG tablet Take 5 mg by mouth at bedtime.      No current facility-administered medications on file prior to visit.     Allergies:   Allergies  Allergen Reactions  . Prednisone Other (See Comments)    "makes me feel terrible"    Physical Exam General: Obese middle-age Caucasian male, seated, in no evident distress Head: head normocephalic and atraumatic.   Neck: supple with no carotid or supraclavicular bruits Cardiovascular: regular rate and rhythm, no murmurs Musculoskeletal: no deformity Skin:  no rash/petichiae Vascular:  Normal pulses all extremities  Neurologic Exam Mental Status: Awake and fully alert. Oriented to place and time. Recent and remote memory intact. Attention span, concentration and fund of knowledge appropriate. Mood and affect appropriate.  Diminished recall 2/3.  Able to name only 2 animals with 4 legs.  Clock drawing 4/4 .MMSE 25/30 with deficits in orientation recall and following three-step commands.  Has trouble copying intersecting pentagons. Cranial Nerves: Fundoscopic exam not done Pupils equal, briskly reactive to light. Extraocular movements full without nystagmus. Visual fields full to confrontation. Hearing intact. Facial sensation intact. Face, tongue, palate moves normally and symmetrically.  Motor: Normal bulk and tone. Normal strength in all tested extremity muscles. Sensory.: Initial touch , pinprick , position and vibratory sensation from the ankle down bilaterally in a symmetric distribution Romberg sign positive Coordination: Rapid alternating movements normal in all extremities. Finger-to-nose and heel-to-shin performed accurately bilaterally. Gait and Station: Arises from chair without difficulty. Stance is broad-based Gait demonstrates normal stride length and mild imbalance . Not able to heel, toe and tandem walk      Reflexes: 1+ and symmetric. Toes downgoing.  ASSESSMENT: 70 year Caucasian male with 6-8 months history of leg weakness of unclear etiology  . Vascular risk factors of atrial fibrillation, coronary artery disease, hypertension, hyperlipidemia and obesity.  New complaints of subacute decline in memory and cognition likely due to age-related mild cognitive impairment.  Family is raised the issue of normal pressure hydrocephalus but his clinical symptomatology and brain imaging do not support this strongly    PLAN: I had a long discussion with the patient and his wife regarding his recent cognitive and memory difficulties which likely represent mild cognitive impairment.  I recommend trial of Aricept 5 mg daily for 1 month to be increased to 10 mg daily if tolerated.  I have discussed side effects with them and asked him to call me if needed.  The patient's daughters sent me an article regarding normal pressure hydrocephalus but patient's clinical picture is not quite typical.  He had a CT scan of the head on 04/23/2018 which does not show normal pressure hydrocephalus.  Moreover to do a diagnostic spinal tap to diagnose this condition he may have to come off the Eliquis for 3 days and patient appears not to be willing to do this at the present time.  He will return for follow-up in 3 months or call earlier if necessary.necessary. Greater than 50% time during this 25 minute  visit was spent on counseling and coordination of care about his gait difficulties, stroke and answered questions   Antony Contras, MD  [facility] [address] [place], [place] [address]  Phone [phone] Fax [phone] Note: This document was prepared with digital dictation and possible smart phrase technology. Any transcriptional errors that result from this process are unintentional.

## 2018-07-04 ENCOUNTER — Ambulatory Visit: Payer: Medicare Other | Admitting: Neurology

## 2018-07-19 DIAGNOSIS — I5032 Chronic diastolic (congestive) heart failure: Secondary | ICD-10-CM | POA: Diagnosis not present

## 2018-07-19 DIAGNOSIS — E782 Mixed hyperlipidemia: Secondary | ICD-10-CM | POA: Diagnosis not present

## 2018-07-19 DIAGNOSIS — I2510 Atherosclerotic heart disease of native coronary artery without angina pectoris: Secondary | ICD-10-CM | POA: Diagnosis not present

## 2018-07-19 DIAGNOSIS — E034 Atrophy of thyroid (acquired): Secondary | ICD-10-CM | POA: Diagnosis not present

## 2018-07-19 DIAGNOSIS — J449 Chronic obstructive pulmonary disease, unspecified: Secondary | ICD-10-CM | POA: Diagnosis not present

## 2018-07-19 DIAGNOSIS — I110 Hypertensive heart disease with heart failure: Secondary | ICD-10-CM | POA: Diagnosis not present

## 2018-07-19 DIAGNOSIS — R4181 Age-related cognitive decline: Secondary | ICD-10-CM | POA: Diagnosis not present

## 2018-07-19 DIAGNOSIS — R7301 Impaired fasting glucose: Secondary | ICD-10-CM | POA: Diagnosis not present

## 2018-07-19 DIAGNOSIS — I480 Paroxysmal atrial fibrillation: Secondary | ICD-10-CM | POA: Diagnosis not present

## 2018-07-19 DIAGNOSIS — Z23 Encounter for immunization: Secondary | ICD-10-CM | POA: Diagnosis not present

## 2018-08-13 DIAGNOSIS — R197 Diarrhea, unspecified: Secondary | ICD-10-CM | POA: Diagnosis not present

## 2018-08-13 DIAGNOSIS — A059 Bacterial foodborne intoxication, unspecified: Secondary | ICD-10-CM | POA: Diagnosis not present

## 2018-08-16 DIAGNOSIS — K591 Functional diarrhea: Secondary | ICD-10-CM | POA: Diagnosis not present

## 2018-09-12 ENCOUNTER — Other Ambulatory Visit: Payer: Self-pay | Admitting: Neurology

## 2018-09-25 ENCOUNTER — Ambulatory Visit (INDEPENDENT_AMBULATORY_CARE_PROVIDER_SITE_OTHER): Payer: Medicare Other | Admitting: Neurology

## 2018-09-25 ENCOUNTER — Encounter: Payer: Self-pay | Admitting: Neurology

## 2018-09-25 VITALS — BP 116/64 | HR 57 | Ht 70.0 in | Wt 243.6 lb

## 2018-09-25 DIAGNOSIS — R0989 Other specified symptoms and signs involving the circulatory and respiratory systems: Secondary | ICD-10-CM

## 2018-09-25 DIAGNOSIS — G3184 Mild cognitive impairment, so stated: Secondary | ICD-10-CM | POA: Diagnosis not present

## 2018-09-25 MED ORDER — DONEPEZIL HCL 10 MG PO TABS: 10.0000 mg | ORAL_TABLET | Freq: Every day | ORAL | 3 refills | Status: DC

## 2018-09-25 NOTE — Progress Notes (Signed)
[facility] [address] [place]. [place] [address] [phone]       OFFICE FOLLOW UP VISIT  NOTE  [name] Date of Birth:  10-05-1945 Medical Record Number:  [id number]   Referring MD: Darrol Jump, PA-C Reason for Referral:  Leg weakness  HPI: Initial consult 10/23/17 :  [name] is a 73 year old Caucasian male who is accompanied today by his wife. History is obtained from them as well as review of electronic medical records in care everywhere. I was unable personally to look at imaging films. He states that for the last 6-8 months from summer of 2018 he noticed that he is having some leg weakness. Is able to get out of a chair and walk fairly well but after a while he feels his legs are going to give out and feel heavy and has to sit down. He feels better after a while. He denies any fall injury or back pain. He denies radicular pain and tingling numbness or burning in his feet. He was seen by her ophthalmologist who diagnosed him to have right superior quadrantanopsia and he was referred to [facility]. He had an MRI scan of the brain done on 10/24/16  And I have reviewed the report which shows no acute infarct but changes of small vessel disease. He subsequently saw her neurologist Dr. [name] Heck in [place] on 02/06/17 who recommended he continue eliquis for his atrial fibrillation and risk factor modification. Patient denies any other symptoms suggestive of stroke TIA. He does feel his balance is off. He does trip easily and has fallen a couple of times in the last 6 months. He however denies any pain burning or tingling in his feet. He has history of atrial fibrillation and has been cardioverted 3 times. Is currently on eliquis. He does also noticed that his short-term memory is getting worse. He has trouble remembering his appointments. He also is unable to do activities like mowing the yard which he previously is to do. He has not had any evaluation for  neuropathy. Update 01/01/2018 : he returns for follow-up after last visit 2 months ago. He is accompanied by his wife. He continues to have intermittent leg weakness and heaviness with walking a lot 100 - 150 feet. He denies any back pain, radicular pain, tingling numbness or burning in his feet. The patient did undergo lab work on 10/23/17 that showed normal vitamin B12, TSH and peripheral neuropathy labs except for low vitamin D. He has been started on vitamin D replacement by his primary physician for the last 6 weeks but has not noticed an improvement in his leg weakness and walking. A milder level was normal. Patient had EMG nerve conduction study done 11/15/17 which showed no evidence of neuropathy or radiculopathy. She states she has good days and bad days but mostly bad days. He feels that his leg gets heavy and starts dragging particularly his right leg. He has not had any falls or injuries. The patient states he did have peripheral arterial Dopplers done by his cardiologist Dr. Bettina Gavia a year ago and it was normal. Patient is extremely claustrophobic and states he'll undergo MRI of the lumbar spine only if it is under general anesthesia. Update 06/20/2018: He returns for follow-up after last visit 5-1/2 months ago.  He is accompanied by his wife.  He continues to have gait difficulties which he says are unchanged.  He can walk most times fairly well and has no trouble getting out of  a chair or initiating his gait.  After having walked about 100 feet or so he feels his legs are getting heavy and he is to sit down.  After a few minutes of rest he can get up and walk again.  He denies any gait festination or apraxia of stooped posture.  He and his wife expressed concerns about his short-term memory and cognition.  In recent months they have noticed he has trouble keeping up with his appointments and remembering things.  His wife has to remember things for him.  He has also given up driving and drives very little  only short distances.  He denies any bladder urgency, incontinence or dysuria.  He underwent EMG nerve conduction study on 03/21/2018 which was normal.  CT angiogram of the brain and neck on 02/07/2018 showed no significant intracranial or extracranial stenosis.  MRI scan of the  lumbar spine on 01/18/2018 showed no significant degenerative changes, spinal stenosis or disc herniation.  He had a repeat CT scan of the head on 04/23/2018 at [facility] which have personally reviewed showed a small old left occipital infarct and mild generalized atrophy without significant dilatation of the ventricles. Update 09/25/18 : He returns for follow-up after last visit 3 months ago.  He is accompanied by his wife.  He continues to have gait difficulties and leg weakness which are unchanged.  He refuses to get up and walk a lot.  His wife feels his cognitive difficulties may have improved somewhat after starting Aricept.  Is tolerating 10 mg daily well without any GI or dizziness or other side effects.  On Mini-Mental status exam today he scored 30/30 which represents improvement from 25/30 from last visit.  He still misplaces things and forgets appointments.  He has not been doing any cognitively challenging activities.  He last had carotid ultrasound in July 2018 but has not had any follow-up studies since then. ROS:   14 system review of systems is positive for   Walking difficulty and leg weakness,  , memory loss,   only and all other systems negative  PMH:  Past Medical History:  Diagnosis Date  . A-fib ([place])   . Cancer ([place])    skin cancer  . Heart attack ([place])   . Hyperlipidemia   . Hypertension   . Hypothyroidism   . Stroke [facility])     Social History:  Social History   Socioeconomic History  . Marital status: Married    Spouse name: Not on file  . Number of children: Not on file  . Years of education: Not on file  . Highest education level: Not on file  Occupational History  . Occupation:  Retired  Scientific laboratory technician  . Financial resource strain: Not on file  . Food insecurity:    Worry: Not on file    Inability: Not on file  . Transportation needs:    Medical: Not on file    Non-medical: Not on file  Tobacco Use  . Smoking status: Former Smoker    Packs/day: 1.00    Years: 15.00    Pack years: 15.00    Types: Cigarettes    Last attempt to quit: 08/29/1978    Years since quitting: 40.1  . Smokeless tobacco: Never Used  Substance and Sexual Activity  . Alcohol use: No    Alcohol/week: 0.0 standard drinks  . Drug use: No  . Sexual activity: Not on file  Lifestyle  . Physical activity:    Days per week:  Not on file    Minutes per session: Not on file  . Stress: Not on file  Relationships  . Social connections:    Talks on phone: Not on file    Gets together: Not on file    Attends religious service: Not on file    Active member of club or organization: Not on file    Attends meetings of clubs or organizations: Not on file    Relationship status: Not on file  . Intimate partner violence:    Fear of current or ex partner: Not on file    Emotionally abused: Not on file    Physically abused: Not on file    Forced sexual activity: Not on file  Other Topics Concern  . Not on file  Social History Narrative  . Not on file    Medications:   Current Outpatient Medications on File Prior to Visit  Medication Sig Dispense Refill  . amiodarone (PACERONE) 200 MG tablet Take 1 tablet (200 mg total) by mouth daily. 90 tablet 3  . amLODipine (NORVASC) 5 MG tablet Take 5 mg by mouth daily.  2  . apixaban (ELIQUIS) 5 MG TABS tablet Take 5 mg by mouth 2 (two) times daily.    Marland Kitchen atorvastatin (LIPITOR) 10 MG tablet Take 10 mg by mouth at bedtime.    . carvedilol (COREG) 3.125 MG tablet Take 3.125 mg by mouth 2 (two) times daily with a meal.    . cloNIDine (CATAPRES) 0.1 MG tablet Take 0.1 mg by mouth 2 (two) times daily as needed (FOR BLOOD PRESSURE GREATER THAN 140/80).     .  furosemide (LASIX) 40 MG tablet Take 40 mg by mouth daily as needed for fluid.     Marland Kitchen levalbuterol (XOPENEX HFA) 45 MCG/ACT inhaler Inhale 2 puffs into the lungs every 4 (four) hours as needed for wheezing or shortness of breath.    . levothyroxine (SYNTHROID, LEVOTHROID) 137 MCG tablet Take 137 mcg by mouth daily before breakfast.     . nitroGLYCERIN (NITROSTAT) 0.4 MG SL tablet Place 1 tablet (0.4 mg total) under the tongue every 5 (five) minutes as needed for chest pain. 25 tablet 11  . potassium chloride (K-DUR,KLOR-CON) 10 MEQ tablet Take 10 mEq by mouth daily as needed (for fluid retention.).     Marland Kitchen valsartan (DIOVAN) 320 MG tablet Take 320 mg by mouth daily.    . Vitamin D, Ergocalciferol, (DRISDOL) 50000 units CAPS capsule Take 50,000 Units by mouth every Saturday. MORNING.    Marland Kitchen zolpidem (AMBIEN) 5 MG tablet Take 5 mg by mouth at bedtime.      No current facility-administered medications on file prior to visit.     Allergies:   Allergies  Allergen Reactions  . Prednisone Other (See Comments)    "makes me feel terrible"    Physical Exam General: Obese middle-age Caucasian male, seated, in no evident distress Head: head normocephalic and atraumatic.   Neck: supple with no carotid or supraclavicular bruits Cardiovascular: regular rate and rhythm, no murmurs Musculoskeletal: no deformity Skin:  no rash/petichiae Vascular:  Normal pulses all extremities  Neurologic Exam Mental Status: Awake and fully alert. Oriented to place and time. Recent and remote memory intact. Attention span, concentration and fund of knowledge appropriate. Mood and affect appropriate.  Diminished recall 2/3.  Able to name only 2 animals with 4 legs.  Clock drawing 4/4 .MMSE 30/30 ( last visit  25/30 )    Cranial Nerves: Fundoscopic exam not done  Pupils equal, briskly reactive to light. Extraocular movements full without nystagmus. Visual fields full to confrontation. Hearing intact. Facial sensation intact.  Face, tongue, palate moves normally and symmetrically.  Motor: Normal bulk and tone. Normal strength in all tested extremity muscles. Sensory.: Initial touch , pinprick , position and vibratory sensation from the ankle down bilaterally in a symmetric distribution Romberg sign positive Coordination: Rapid alternating movements normal in all extremities. Finger-to-nose and heel-to-shin performed accurately bilaterally. Gait and Station: Arises from chair without difficulty. Stance is broad-based Gait demonstrates normal stride length and mild imbalance . Not able to heel, toe and tandem walk    Reflexes: 1+ and symmetric. Toes downgoing.       ASSESSMENT: 71 year Caucasian male with 6-8 months history of leg weakness of unclear etiology  . Vascular risk factors of atrial fibrillation, coronary artery disease, hypertension, hyperlipidemia and obesity.  Memory and cognitive difficulties due to mild cognitive impairment which has shown some improvement on Aricept..  Family is raised the issue of normal pressure hydrocephalus but his clinical symptomatology and brain imaging do not support this strongly    PLAN: I had a long discussion with the patient and his wife regarding his mild cognitive impairment which appears to have shown improvement on Aricept.  Continue Aricept in the current dose of 10 mg daily.  I encouraged the patient to increase participation in cognitively challenging activities like solving crossword puzzles, playing bridge and sudoku.  We also discussed memory compensation strategies.  I also encouraged him to be active and walk and exercise regularly.  His gait and balance difficulties and nonprogressive and cognitive impairment appears to be improving hence I do not believe he meets criteria for normal pressure hydrocephalus.  Check carotid ultrasound to follow his right carotid bruit.  Patient prefers this being done in [place] at his cardiologist office.  He will return for  follow-up in 6 months with my nurse practitioner Janett Billow or call earlier if necessary. Greater than 50% time during this 25 minute  visit was spent on counseling and coordination of care about his cognitive impairment,gait difficulties, stroke and answered questions   Antony Contras, MD  [facility] [address] [place], [place] [address]  Phone [phone] Fax [phone] Note: This document was prepared with digital dictation and possible smart phrase technology. Any transcriptional errors that result from this process are unintentional.

## 2018-09-25 NOTE — Patient Instructions (Signed)
I had a long discussion with the patient and his wife regarding his mild cognitive impairment which appears to have shown improvement on Aricept.  Continue Aricept in the current dose of 10 mg daily.  I encouraged the patient to increase participation in cognitively challenging activities like solving crossword puzzles, playing bridge and sudoku.  We also discussed memory compensation strategies.  I also encouraged him to be active and walk and exercise regularly.  His gait and balance difficulties and nonprogressive and cognitive impairment appears to be improving hence I do not believe he meets criteria for normal pressure hydrocephalus.  Check carotid ultrasound to follow his right carotid bruit.  Patient prefers this being done in [place] at his cardiologist office.  He will return for follow-up in 6 months with my nurse practitioner Janett Billow or call earlier if necessary.  Memory Compensation Strategies  1. Use "WARM" strategy.  W= write it down  A= associate it  R= repeat it  M= make a mental note  2.   You can keep a Social worker.  Use a 3-ring notebook with sections for the following: calendar, important names and phone numbers,  medications, doctors' names/phone numbers, lists/reminders, and a section to journal what you did  each day.   3.    Use a calendar to write appointments down.  4.    Write yourself a schedule for the day.  This can be placed on the calendar or in a separate section of the Memory Notebook.  Keeping a  regular schedule can help memory.  5.    Use medication organizer with sections for each day or morning/evening pills.  You may need help loading it  6.    Keep a basket, or pegboard by the door.  Place items that you need to take out with you in the basket or on the pegboard.  You may also want to  include a message board for reminders.  7.    Use sticky notes.  Place sticky notes with reminders in a place where the task is performed.  For example: " turn  off the  stove" placed by the stove, "lock the door" placed on the door at eye level, " take your medications" on  the bathroom mirror or by the place where you normally take your medications.  8.    Use alarms/timers.  Use while cooking to remind yourself to check on food or as a reminder to take your medicine, or as a  reminder to make a call, or as a reminder to perform another task, etc.

## 2018-09-26 DIAGNOSIS — K591 Functional diarrhea: Secondary | ICD-10-CM | POA: Diagnosis not present

## 2018-10-08 ENCOUNTER — Ambulatory Visit (INDEPENDENT_AMBULATORY_CARE_PROVIDER_SITE_OTHER): Payer: Medicare Other

## 2018-10-08 DIAGNOSIS — R0989 Other specified symptoms and signs involving the circulatory and respiratory systems: Secondary | ICD-10-CM | POA: Diagnosis not present

## 2018-10-08 NOTE — Progress Notes (Signed)
Carotid duplex exam has been performed. Severe bilateral ECA stenosis, Mild bilateral ICA stenosis.  Jimmy Susane Bey RDCS, RVT

## 2018-10-10 ENCOUNTER — Telehealth: Payer: Self-pay

## 2018-10-10 NOTE — Telephone Encounter (Signed)
Pt return phone call. I gave vas carotid ultrasound study being unremarkable without any significant blockages.Pt verbalized understanding. ------

## 2018-10-10 NOTE — Telephone Encounter (Signed)
-----   Message from Garvin Fila, MD sent at 10/10/2018  8:44 AM EST ----- Kindly inform the patient that carotid ultrasound study was unremarkable without any significant blockages seen

## 2018-10-10 NOTE — Telephone Encounter (Signed)
Left vm for patient to call back about carotid ultrasound. I called the preferred number but vm was full could not leave message. ------

## 2018-10-17 DIAGNOSIS — K591 Functional diarrhea: Secondary | ICD-10-CM | POA: Diagnosis not present

## 2018-10-22 DIAGNOSIS — R4181 Age-related cognitive decline: Secondary | ICD-10-CM | POA: Diagnosis not present

## 2018-10-22 DIAGNOSIS — J441 Chronic obstructive pulmonary disease with (acute) exacerbation: Secondary | ICD-10-CM | POA: Diagnosis not present

## 2018-10-22 DIAGNOSIS — I110 Hypertensive heart disease with heart failure: Secondary | ICD-10-CM | POA: Diagnosis not present

## 2018-10-22 DIAGNOSIS — I2510 Atherosclerotic heart disease of native coronary artery without angina pectoris: Secondary | ICD-10-CM | POA: Diagnosis not present

## 2018-10-22 DIAGNOSIS — I480 Paroxysmal atrial fibrillation: Secondary | ICD-10-CM | POA: Diagnosis not present

## 2018-10-22 DIAGNOSIS — I5032 Chronic diastolic (congestive) heart failure: Secondary | ICD-10-CM | POA: Diagnosis not present

## 2018-10-22 DIAGNOSIS — R7301 Impaired fasting glucose: Secondary | ICD-10-CM | POA: Diagnosis not present

## 2018-10-22 DIAGNOSIS — J449 Chronic obstructive pulmonary disease, unspecified: Secondary | ICD-10-CM | POA: Diagnosis not present

## 2018-10-22 DIAGNOSIS — E034 Atrophy of thyroid (acquired): Secondary | ICD-10-CM | POA: Diagnosis not present

## 2018-10-22 DIAGNOSIS — E782 Mixed hyperlipidemia: Secondary | ICD-10-CM | POA: Diagnosis not present

## 2018-10-28 NOTE — Progress Notes (Signed)
Cardiology Office Note:    Date:  10/29/2018   ID:  John Kemp, DOB 1946/07/25, MRN [id number]  PCP:  [name], MD  Cardiologist:  Shirlee More, MD    Referring MD: [name], MD    ASSESSMENT:    1. CAD in native artery   2. Chronic diastolic heart failure ([place])   3. PAF (paroxysmal atrial fibrillation) ([place])   4. On amiodarone therapy   5. Anticoagulated   6. Pure hypercholesterolemia    PLAN:    In order of problems listed above:  1. Stable CAD New York Heart Association class I continue his current medical treatment including anticoagulant beta-blocker calcium channel blocker and high intensity statin at this time which does not require an ischemia evaluation 2. Heart failure stable compensated New York Heart Association class I he has no edema continue his current loop diuretic 3. Stable maintain sinus rhythm he had labs in the last week liver function thyroid is following this PCP office the family has not heard the results as of yet 4. Continue low-dose amiodarone no evidence of toxicity check EKG today 5. Continue anticoagulant with atrial arrhythmia and history of stroke 6. Continue with statin we discussed withdrawing as a diagnostic test with his gait dysfunction however he tells me we have done that in the past and he was unimproved.  Family is very concerned he has unrecognized neurologic disease raise the question of normal pressure hydrocephalus and I asked him to decide if they want a referral to go to [facility] for another opinion.   Next appointment: 6 months   Medication Adjustments/Labs and Tests Ordered: Current medicines are reviewed at length with the patient today.  Concerns regarding medicines are outlined above.  No orders of the defined types were placed in this encounter.  No orders of the defined types were placed in this encounter.   No chief complaint on file.   History of Present Illness:    John Kemp is a 73 y.o. male  with a hx of CAD, diastolic CHF, Atrial Fibrillation, HTN, S/P CABG and on amiodoarone  and a stroke with visual loss last seen 04/24/18. Compliance with diet, lifestyle and medications: yes Past Medical History:  Diagnosis Date  . A-fib ([place])   . Cancer ([place])    skin cancer  . Heart attack ([place])   . Hyperlipidemia   . Hypertension   . Hypothyroidism   . Stroke [facility])     Past Surgical History:  Procedure Laterality Date  . APPENDECTOMY  1956  . CARDIAC CATHETERIZATION    . CHOLECYSTECTOMY  2006  . CORONARY ARTERY BYPASS GRAFT  2001  . RADIOLOGY WITH ANESTHESIA Bilateral 01/18/2018   Procedure: MRI WITH ANESTHESIA LUMBAR SPINE WITH AND WITHOUT CONTRAST;  Surgeon: Radiologist, Medication, MD;  Location: [place];  Service: Radiology;  Laterality: Bilateral;  . TONSILLECTOMY  1975    Current Medications: Current Meds  Medication Sig  . amiodarone (PACERONE) 200 MG tablet Take 1 tablet (200 mg total) by mouth daily.  Marland Kitchen amLODipine (NORVASC) 5 MG tablet Take 5 mg by mouth daily.  Marland Kitchen apixaban (ELIQUIS) 5 MG TABS tablet Take 5 mg by mouth 2 (two) times daily.  Marland Kitchen atorvastatin (LIPITOR) 10 MG tablet Take 10 mg by mouth at bedtime.  . carvedilol (COREG) 3.125 MG tablet Take 3.125 mg by mouth 2 (two) times daily with a meal.  . cloNIDine (CATAPRES) 0.1 MG tablet Take 0.1 mg by mouth 2 (two) times daily as  needed (FOR BLOOD PRESSURE GREATER THAN 140/80).   . donepezil (ARICEPT) 10 MG tablet Take 1 tablet (10 mg total) by mouth at bedtime. Start 1/2 tablet daily x 4 weeks and then 1 tablet daily  . furosemide (LASIX) 40 MG tablet Take 40 mg by mouth daily as needed for fluid.   Marland Kitchen levalbuterol (XOPENEX HFA) 45 MCG/ACT inhaler Inhale 2 puffs into the lungs every 4 (four) hours as needed for wheezing or shortness of breath.  . levothyroxine (SYNTHROID, LEVOTHROID) 137 MCG tablet Take 137 mcg by mouth daily before breakfast.   . nitroGLYCERIN (NITROSTAT) 0.4 MG SL tablet Place 1 tablet (0.4 mg total)  under the tongue every 5 (five) minutes as needed for chest pain.  . potassium chloride (K-DUR,KLOR-CON) 10 MEQ tablet Take 10 mEq by mouth daily as needed (for fluid retention.).   Marland Kitchen valsartan (DIOVAN) 320 MG tablet Take 320 mg by mouth daily.  . Vitamin D, Ergocalciferol, (DRISDOL) 50000 units CAPS capsule Take 50,000 Units by mouth every Saturday. MORNING.  Marland Kitchen zolpidem (AMBIEN) 5 MG tablet Take 5 mg by mouth at bedtime.      Allergies:   Prednisone   Social History   Socioeconomic History  . Marital status: Married    Spouse name: Not on file  . Number of children: Not on file  . Years of education: Not on file  . Highest education level: Not on file  Occupational History  . Occupation: Retired  Scientific laboratory technician  . Financial resource strain: Not on file  . Food insecurity:    Worry: Not on file    Inability: Not on file  . Transportation needs:    Medical: Not on file    Non-medical: Not on file  Tobacco Use  . Smoking status: Former Smoker    Packs/day: 1.00    Years: 15.00    Pack years: 15.00    Types: Cigarettes    Last attempt to quit: 08/29/1978    Years since quitting: 40.1  . Smokeless tobacco: Never Used  Substance and Sexual Activity  . Alcohol use: No    Alcohol/week: 0.0 standard drinks  . Drug use: No  . Sexual activity: Not on file  Lifestyle  . Physical activity:    Days per week: Not on file    Minutes per session: Not on file  . Stress: Not on file  Relationships  . Social connections:    Talks on phone: Not on file    Gets together: Not on file    Attends religious service: Not on file    Active member of club or organization: Not on file    Attends meetings of clubs or organizations: Not on file    Relationship status: Not on file  Other Topics Concern  . Not on file  Social History Narrative  . Not on file     Family History: The patient's family history includes Breast cancer in his mother; Heart disease in his father. ROS:   Please see  the history of present illness.    All other systems reviewed and are negative.  EKGs/Labs/Other Studies Reviewed:    The following studies were reviewed today:  EKG:  EKG ordered today and personally reviewed.  The ekg ordered today demonstrates sinus rhythm bifascicular heart block  Carotid duplex 10/08/18:   Summary: Right Carotid: Velocities in the right ICA are consistent with a 1-39% stenosis.  Non-hemodynamically significant plaque <50% noted in the CCA. The ECA appears >50% stenosed. Left Carotid: Velocities in the left ICA are consistent with a 1-39% stenosis.               Non-hemodynamically significant plaque noted in the CCA. The ECA               appears >50% stenosed. Vertebrals:  Left vertebral artery demonstrates antegrade flow. Subclavians: . Turbulent flow in the subclavian arteries bilaterally  Recent Labs: 01/18/2018: BUN 17; Creatinine, Ser 1.32; Hemoglobin 13.6; Platelets 168; Potassium 3.7; Sodium 140  Recent Lipid Panel    Component Value Date/Time   CHOL 184 04/02/2017 0213   TRIG 176 (H) 04/02/2017 0213   HDL 31 (L) 04/02/2017 0213   CHOLHDL 5.9 04/02/2017 0213   VLDL 35 04/02/2017 0213   LDLCALC 118 (H) 04/02/2017 0213    Physical Exam:    VS:  BP 112/68 (BP Location: Right Arm, Patient Position: Sitting, Cuff Size: Large)   Pulse (!) 55   Ht 5\' 10"  (1.778 m)   Wt 238 lb 9.6 oz (108.2 kg)   SpO2 97%   BMI 34.24 kg/m     Wt Readings from Last 3 Encounters:  10/29/18 238 lb 9.6 oz (108.2 kg)  09/25/18 243 lb 9.6 oz (110.5 kg)  06/20/18 258 lb (117 kg)     GEN:  Well nourished, well developed in no acute distress HEENT: Normal NECK: No JVD; No carotid bruits LYMPHATICS: No lymphadenopathy CARDIAC: RRR, no murmurs, rubs, gallops RESPIRATORY:  Clear to auscultation without rales, wheezing or rhonchi  ABDOMEN: Soft, non-tender, non-distended MUSCULOSKELETAL:  No edema; No deformity  SKIN: Warm and dry NEUROLOGIC:  Alert and  oriented x 3 PSYCHIATRIC:  Normal affect    Signed, Shirlee More, MD  10/29/2018 9:56 AM    [place]

## 2018-10-29 ENCOUNTER — Ambulatory Visit (INDEPENDENT_AMBULATORY_CARE_PROVIDER_SITE_OTHER): Payer: Medicare Other | Admitting: Cardiology

## 2018-10-29 ENCOUNTER — Encounter: Payer: Self-pay | Admitting: Cardiology

## 2018-10-29 VITALS — BP 112/68 | HR 55 | Ht 70.0 in | Wt 238.6 lb

## 2018-10-29 DIAGNOSIS — I480 Paroxysmal atrial fibrillation: Secondary | ICD-10-CM

## 2018-10-29 DIAGNOSIS — I25118 Atherosclerotic heart disease of native coronary artery with other forms of angina pectoris: Secondary | ICD-10-CM

## 2018-10-29 DIAGNOSIS — E7800 Pure hypercholesterolemia, unspecified: Secondary | ICD-10-CM | POA: Diagnosis not present

## 2018-10-29 DIAGNOSIS — Z79899 Other long term (current) drug therapy: Secondary | ICD-10-CM

## 2018-10-29 DIAGNOSIS — I5032 Chronic diastolic (congestive) heart failure: Secondary | ICD-10-CM

## 2018-10-29 DIAGNOSIS — Z7901 Long term (current) use of anticoagulants: Secondary | ICD-10-CM

## 2018-10-29 DIAGNOSIS — I2510 Atherosclerotic heart disease of native coronary artery without angina pectoris: Secondary | ICD-10-CM

## 2018-10-29 MED ORDER — NITROGLYCERIN 0.4 MG SL SUBL: 0.4000 mg | SUBLINGUAL_TABLET | SUBLINGUAL | 5 refills | Status: DC | PRN

## 2018-10-29 NOTE — Patient Instructions (Signed)
Medication Instructions:  Your physician recommends that you continue on your current medications as directed. Please refer to the Current Medication list given to you today.  If you need a refill on your cardiac medications before your next appointment, please call your pharmacy.   Lab work: None  If you have labs (blood work) drawn today and your tests are completely normal, you will receive your results only by: Marland Kitchen MyChart Message (if you have MyChart) OR . A paper copy in the mail If you have any lab test that is abnormal or we need to change your treatment, we will call you to review the results.  Testing/Procedures: You had an EKG today.   Follow-Up: At [facility], you and your health needs are our priority.  As part of our continuing mission to provide you with exceptional heart care, we have created designated Provider Care Teams.  These Care Teams include your primary Cardiologist (physician) and Advanced Practice Providers (APPs -  Physician Assistants and Nurse Practitioners) who all work together to provide you with the care you need, when you need it. You will need a follow up appointment in 6 months.

## 2018-10-31 ENCOUNTER — Encounter: Payer: Self-pay | Admitting: Cardiology

## 2018-11-06 DIAGNOSIS — K648 Other hemorrhoids: Secondary | ICD-10-CM | POA: Diagnosis not present

## 2018-11-06 DIAGNOSIS — Z7901 Long term (current) use of anticoagulants: Secondary | ICD-10-CM | POA: Diagnosis not present

## 2018-11-06 DIAGNOSIS — Z79899 Other long term (current) drug therapy: Secondary | ICD-10-CM | POA: Diagnosis not present

## 2018-11-06 DIAGNOSIS — D126 Benign neoplasm of colon, unspecified: Secondary | ICD-10-CM | POA: Diagnosis not present

## 2018-11-06 DIAGNOSIS — Z8673 Personal history of transient ischemic attack (TIA), and cerebral infarction without residual deficits: Secondary | ICD-10-CM | POA: Diagnosis not present

## 2018-11-06 DIAGNOSIS — Z951 Presence of aortocoronary bypass graft: Secondary | ICD-10-CM | POA: Diagnosis not present

## 2018-11-06 DIAGNOSIS — K591 Functional diarrhea: Secondary | ICD-10-CM | POA: Diagnosis not present

## 2018-11-06 DIAGNOSIS — I252 Old myocardial infarction: Secondary | ICD-10-CM | POA: Diagnosis not present

## 2018-11-06 DIAGNOSIS — Z1211 Encounter for screening for malignant neoplasm of colon: Secondary | ICD-10-CM | POA: Diagnosis not present

## 2018-11-06 DIAGNOSIS — I10 Essential (primary) hypertension: Secondary | ICD-10-CM | POA: Diagnosis not present

## 2018-11-06 LAB — HM COLONOSCOPY

## 2018-11-13 DIAGNOSIS — L578 Other skin changes due to chronic exposure to nonionizing radiation: Secondary | ICD-10-CM | POA: Diagnosis not present

## 2018-11-13 DIAGNOSIS — L570 Actinic keratosis: Secondary | ICD-10-CM | POA: Diagnosis not present

## 2018-11-13 DIAGNOSIS — R233 Spontaneous ecchymoses: Secondary | ICD-10-CM | POA: Diagnosis not present

## 2018-12-17 ENCOUNTER — Other Ambulatory Visit: Payer: Self-pay | Admitting: Neurology

## 2018-12-18 ENCOUNTER — Other Ambulatory Visit: Payer: Self-pay

## 2018-12-18 MED ORDER — DONEPEZIL HCL 10 MG PO TABS: 10.0000 mg | ORAL_TABLET | Freq: Every day | ORAL | 1 refills | Status: DC

## 2019-01-23 DIAGNOSIS — E034 Atrophy of thyroid (acquired): Secondary | ICD-10-CM | POA: Diagnosis not present

## 2019-01-23 DIAGNOSIS — J449 Chronic obstructive pulmonary disease, unspecified: Secondary | ICD-10-CM | POA: Diagnosis not present

## 2019-01-23 DIAGNOSIS — E782 Mixed hyperlipidemia: Secondary | ICD-10-CM | POA: Diagnosis not present

## 2019-01-23 DIAGNOSIS — I2510 Atherosclerotic heart disease of native coronary artery without angina pectoris: Secondary | ICD-10-CM | POA: Diagnosis not present

## 2019-01-23 DIAGNOSIS — I110 Hypertensive heart disease with heart failure: Secondary | ICD-10-CM | POA: Diagnosis not present

## 2019-01-23 DIAGNOSIS — I5032 Chronic diastolic (congestive) heart failure: Secondary | ICD-10-CM | POA: Diagnosis not present

## 2019-01-23 DIAGNOSIS — R7301 Impaired fasting glucose: Secondary | ICD-10-CM | POA: Diagnosis not present

## 2019-01-23 DIAGNOSIS — I480 Paroxysmal atrial fibrillation: Secondary | ICD-10-CM | POA: Diagnosis not present

## 2019-01-23 LAB — MICROALBUMIN, URINE: Microalb, Ur: POSITIVE

## 2019-03-26 ENCOUNTER — Encounter: Payer: Self-pay | Admitting: Adult Health

## 2019-03-26 ENCOUNTER — Other Ambulatory Visit: Payer: Self-pay

## 2019-03-26 ENCOUNTER — Ambulatory Visit (INDEPENDENT_AMBULATORY_CARE_PROVIDER_SITE_OTHER): Payer: Medicare Other | Admitting: Adult Health

## 2019-03-26 VITALS — BP 156/80 | HR 47 | Temp 98.0°F | Ht 70.0 in | Wt 246.2 lb

## 2019-03-26 DIAGNOSIS — G3184 Mild cognitive impairment, so stated: Secondary | ICD-10-CM | POA: Diagnosis not present

## 2019-03-26 DIAGNOSIS — R29898 Other symptoms and signs involving the musculoskeletal system: Secondary | ICD-10-CM | POA: Diagnosis not present

## 2019-03-26 NOTE — Progress Notes (Signed)
[facility] [address] [place]. [place] [address] [phone]       OFFICE FOLLOW UP VISIT  NOTE  [name] Date of Birth:  [birth year] Medical Record Number:  [id number]   Referring MD: Darrol Jump, PA-C Reason for Referral:  Leg weakness  HPI: Initial consult 10/23/17 :  [name] is a 73 year old Caucasian male who is accompanied today by his wife. History is obtained from them as well as review of electronic medical records in care everywhere. I was unable personally to look at imaging films. He states that for the last 6-8 months from summer of 2018 he noticed that he is having some leg weakness. Is able to get out of a chair and walk fairly well but after a while he feels his legs are going to give out and feel heavy and has to sit down. He feels better after a while. He denies any fall injury or back pain. He denies radicular pain and tingling numbness or burning in his feet. He was seen by her ophthalmologist who diagnosed him to have right superior quadrantanopsia and he was referred to [facility]. He had an MRI scan of the brain done on 10/24/16  And I have reviewed the report which shows no acute infarct but changes of small vessel disease. He subsequently saw her neurologist Dr. [name] Heck in [place] on 02/06/17 who recommended he continue eliquis for his atrial fibrillation and risk factor modification. Patient denies any other symptoms suggestive of stroke TIA. He does feel his balance is off. He does trip easily and has fallen a couple of times in the last 6 months. He however denies any pain burning or tingling in his feet. He has history of atrial fibrillation and has been cardioverted 3 times. Is currently on eliquis. He does also noticed that his short-term memory is getting worse. He has trouble remembering his appointments. He also is unable to do activities like mowing the yard which he previously is to do. He has not had any evaluation for  neuropathy. Update 01/01/2018 : he returns for follow-up after last visit 2 months ago. He is accompanied by his wife. He continues to have intermittent leg weakness and heaviness with walking a lot 100 - 150 feet. He denies any back pain, radicular pain, tingling numbness or burning in his feet. The patient did undergo lab work on 10/23/17 that showed normal vitamin B12, TSH and peripheral neuropathy labs except for low vitamin D. He has been started on vitamin D replacement by his primary physician for the last 6 weeks but has not noticed an improvement in his leg weakness and walking. A milder level was normal. Patient had EMG nerve conduction study done 11/15/17 which showed no evidence of neuropathy or radiculopathy. She states she has good days and bad days but mostly bad days. He feels that his leg gets heavy and starts dragging particularly his right leg. He has not had any falls or injuries. The patient states he did have peripheral arterial Dopplers done by his cardiologist Dr. Bettina Gavia a year ago and it was normal. Patient is extremely claustrophobic and states he'll undergo MRI of the lumbar spine only if it is under general anesthesia. Update 06/20/2018: He returns for follow-up after last visit 5-1/2 months ago.  He is accompanied by his wife.  He continues to have gait difficulties which he says are unchanged.  He can walk most times fairly well and has no trouble getting out of  a chair or initiating his gait.  After having walked about 100 feet or so he feels his legs are getting heavy and he is to sit down.  After a few minutes of rest he can get up and walk again.  He denies any gait festination or apraxia of stooped posture.  He and his wife expressed concerns about his short-term memory and cognition.  In recent months they have noticed he has trouble keeping up with his appointments and remembering things.  His wife has to remember things for him.  He has also given up driving and drives very little  only short distances.  He denies any bladder urgency, incontinence or dysuria.  He underwent EMG nerve conduction study on 03/21/2018 which was normal.  CT angiogram of the brain and neck on 02/07/2018 showed no significant intracranial or extracranial stenosis.  MRI scan of the  lumbar spine on 01/18/2018 showed no significant degenerative changes, spinal stenosis or disc herniation.  He had a repeat CT scan of the head on 04/23/2018 at [facility] which have personally reviewed showed a small old left occipital infarct and mild generalized atrophy without significant dilatation of the ventricles.  Update 09/25/18 : He returns for follow-up after last visit 3 months ago.  He is accompanied by his wife.  He continues to have gait difficulties and leg weakness which are unchanged.  He refuses to get up and walk a lot.  His wife feels his cognitive difficulties may have improved somewhat after starting Aricept.  Is tolerating 10 mg daily well without any GI or dizziness or other side effects.  On Mini-Mental status exam today he scored 30/30 which represents improvement from 25/30 from last visit.  He still misplaces things and forgets appointments.  He has not been doing any cognitively challenging activities.  He last had carotid ultrasound in July 2018 but has not had any follow-up studies since then.  03/26/2019 visit: [name] is a 73 year old male who is being seen today for 83-month follow-up.  He continues to experience leg pain and subjective weakness after prolonged ambulation or with exertion.  He receives minimal activity during the day with overall sedentary lifestyle.  He ambulates without assistive device and denies any recent falls.  Recently started on meloxicam 7.5 mg as needed by his PCP which he may take every other day which has provided benefit. Cognition has been stable with possible improvement with continuation of Aricept without reported side effects. Carotid ultrasound performed on  10/08/2018 with right ICA 1 to 39% stenosis, nonhemodynamically significant plaque less than 50% CCA and ECA appears greater than 50% stenosis.  Left carotid showed 1 to 39% stenosis, nonhemodynamically significant plaque within CCA and ECA greater than 50% stenosis.  Left vertebral artery demonstrates antegrade flow.    ROS:   14 system review of systems is positive for ambulation difficulty and memory loss only and all other systems negative  PMH:  Past Medical History:  Diagnosis Date   A-fib ([place])    Cancer ([place])    skin cancer   Heart attack ([place])    Hyperlipidemia    Hypertension    Hypothyroidism    Stroke [facility])     Social History:  Social History   Socioeconomic History   Marital status: Married    Spouse name: Not on file   Number of children: Not on file   Years of education: Not on file   Highest education level: Not on file  Occupational History  Occupation: Retired  Scientist, product/process development strain: Not on [employer] insecurity    Worry: Not on file    Inability: Not on Lexicographer needs    Medical: Not on file    Non-medical: Not on file  Tobacco Use   Smoking status: Former Smoker    Packs/day: 1.00    Years: 15.00    Pack years: 15.00    Types: Cigarettes    Quit date: 08/29/1978    Years since quitting: 40.6   Smokeless tobacco: Never Used  Substance and Sexual Activity   Alcohol use: No    Alcohol/week: 0.0 standard drinks   Drug use: No   Sexual activity: Not on file  Lifestyle   Physical activity    Days per week: Not on file    Minutes per session: Not on file   Stress: Not on file  Relationships   Social connections    Talks on phone: Not on file    Gets together: Not on file    Attends religious service: Not on file    Active member of club or organization: Not on file    Attends meetings of clubs or organizations: Not on file    Relationship status: Not on file   Intimate partner violence     Fear of current or ex partner: Not on file    Emotionally abused: Not on file    Physically abused: Not on file    Forced sexual activity: Not on file  Other Topics Concern   Not on file  Social History Narrative   Not on file    Medications:   Current Outpatient Medications on File Prior to Visit  Medication Sig Dispense Refill   amiodarone (PACERONE) 200 MG tablet Take 1 tablet (200 mg total) by mouth daily. 90 tablet 3   amLODipine (NORVASC) 5 MG tablet Take 5 mg by mouth daily.  2   apixaban (ELIQUIS) 5 MG TABS tablet Take 5 mg by mouth 2 (two) times daily.     atorvastatin (LIPITOR) 10 MG tablet Take 10 mg by mouth at bedtime.     carvedilol (COREG) 3.125 MG tablet Take 3.125 mg by mouth 2 (two) times daily with a meal.     cloNIDine (CATAPRES) 0.1 MG tablet Take 0.1 mg by mouth 2 (two) times daily as needed (FOR BLOOD PRESSURE GREATER THAN 140/80).      donepezil (ARICEPT) 10 MG tablet Take 1 tablet (10 mg total) by mouth at bedtime. Start 1/2 tablet daily x 4 weeks and then 1 tablet daily 90 tablet 1   furosemide (LASIX) 40 MG tablet Take 40 mg by mouth daily as needed for fluid.      levalbuterol (XOPENEX HFA) 45 MCG/ACT inhaler Inhale 2 puffs into the lungs every 4 (four) hours as needed for wheezing or shortness of breath.     levothyroxine (SYNTHROID, LEVOTHROID) 137 MCG tablet Take 137 mcg by mouth daily before breakfast.      nitroGLYCERIN (NITROSTAT) 0.4 MG SL tablet Place 1 tablet (0.4 mg total) under the tongue every 5 (five) minutes as needed for chest pain. 25 tablet 5   potassium chloride (K-DUR,KLOR-CON) 10 MEQ tablet Take 10 mEq by mouth daily as needed (for fluid retention.).      valsartan (DIOVAN) 320 MG tablet Take 320 mg by mouth daily.     Vitamin D, Ergocalciferol, (DRISDOL) 50000 units CAPS capsule Take 50,000 Units by mouth every Saturday.  MORNING.     zolpidem (AMBIEN) 5 MG tablet Take 5 mg by mouth at bedtime.      No current  facility-administered medications on file prior to visit.     Allergies:   Allergies  Allergen Reactions   Prednisone Other (See Comments)    "makes me feel terrible"    Physical Exam General: Obese middle-age pleasant Caucasian male, seated, in no evident distress Head: head normocephalic and atraumatic.   Neck: supple with no carotid or supraclavicular bruits Cardiovascular: regular rate and rhythm, no murmurs Musculoskeletal: no deformity Skin:  no rash/petichiae Vascular:  Normal pulses all extremities  Neurologic Exam Mental Status: Awake and fully alert. Oriented to place and time. Recent and remote memory intact. Attention span, concentration and fund of knowledge appropriate. Mood and affect appropriate.    Cranial Nerves: Pupils equal, briskly reactive to light. Extraocular movements full without nystagmus. Visual fields full to confrontation. Hearing intact. Facial sensation intact. Face, tongue, palate moves normally and symmetrically.  Motor: Normal bulk and tone. Normal strength in all tested extremity muscles.  No evidence of weakness. Sensory.:  Diminished touch , pinprick , position and vibratory sensation from the ankle down bilaterally in a symmetric distribution  Coordination: Rapid alternating movements normal in all extremities. Finger-to-nose and heel-to-shin performed accurately bilaterally. Gait and Station: Arises from chair without difficulty. Stance is broad-based Gait demonstrates normal stride length and mild imbalance . Not able to heel, toe and tandem walk    Reflexes: 1+ and symmetric. Toes downgoing.       ASSESSMENT: 60 year Caucasian male with over 1 year history of leg weakness of unclear etiology with extensive work-up from a neurological standpoint. Vascular risk factors of atrial fibrillation, coronary artery disease, hypertension, hyperlipidemia and obesity.  Memory and cognitive difficulties due to mild cognitive impairment which has shown  some improvement on Aricept.  Overall remained stable neurologically.    PLAN: -Continue Aricept 10 mg daily for MCI -Encourage mind exercises at home for further improvement of cognition -Highly encouraged increasing activity with at least 30 minutes of continuous exercise daily -Continue to follow with cardiology and PCP as scheduled for underlying medical conditions -Educated patient in regards to use of meloxicam with potential increased risk of stroke and heart disease.  Verbalized understanding.  Full neurological work-up performed regarding bilateral lower extremity symptoms which were all unremarkable.  Advised to follow-up with PCP in regards to ongoing management/monitoring.  Request Aricept refill by PCP but if unable, advised to follow-up in 6 months time for ongoing prescription refills and memory monitoring    Greater than 50% time during this 25 minute  visit was spent on counseling and coordination of care about his cognitive impairment,gait difficulties, stroke and answered questions    Venancio Poisson, AGNP-BC  [facility] [address] [place], [place] [address]  Phone [phone] Fax [phone] Note: This document was prepared with digital dictation and possible smart phrase technology. Any transcriptional errors that result from this process are unintentional.

## 2019-03-26 NOTE — Patient Instructions (Signed)
Your Plan:  Continue Aricept 10 mg nightly for ongoing memory management  Full work-up has been done for management neurological standpoint regarding lower extremity fatigue with weakness after exertion.  Advised to continue to follow with PCP for ongoing management.  Limit use of meloxicam as this can put you at a higher risk for stroke and heart disease  Continue to follow with cardiology as scheduled  Follow-up in 6 months or call earlier if needed.  If your PCP is able to refill Aricept 10 mg nightly ongoing then you can follow-up as needed     Thank you for coming to see [place] at [facility]. I hope we have been able to provide you high quality care today.  You may receive a patient satisfaction survey over the next few weeks. We would appreciate your feedback and comments so that we may continue to improve ourselves and the health of our patients.

## 2019-03-27 NOTE — Progress Notes (Signed)
I agree with the above plan 

## 2019-04-23 DIAGNOSIS — I2510 Atherosclerotic heart disease of native coronary artery without angina pectoris: Secondary | ICD-10-CM | POA: Diagnosis not present

## 2019-04-23 DIAGNOSIS — I480 Paroxysmal atrial fibrillation: Secondary | ICD-10-CM | POA: Diagnosis not present

## 2019-04-23 DIAGNOSIS — I110 Hypertensive heart disease with heart failure: Secondary | ICD-10-CM | POA: Diagnosis not present

## 2019-04-23 DIAGNOSIS — R7301 Impaired fasting glucose: Secondary | ICD-10-CM | POA: Diagnosis not present

## 2019-04-23 DIAGNOSIS — J449 Chronic obstructive pulmonary disease, unspecified: Secondary | ICD-10-CM | POA: Diagnosis not present

## 2019-04-23 DIAGNOSIS — F5101 Primary insomnia: Secondary | ICD-10-CM | POA: Diagnosis not present

## 2019-04-23 DIAGNOSIS — E034 Atrophy of thyroid (acquired): Secondary | ICD-10-CM | POA: Diagnosis not present

## 2019-04-23 DIAGNOSIS — I5032 Chronic diastolic (congestive) heart failure: Secondary | ICD-10-CM | POA: Diagnosis not present

## 2019-04-23 DIAGNOSIS — E782 Mixed hyperlipidemia: Secondary | ICD-10-CM | POA: Diagnosis not present

## 2019-05-01 ENCOUNTER — Ambulatory Visit (INDEPENDENT_AMBULATORY_CARE_PROVIDER_SITE_OTHER): Payer: Medicare Other | Admitting: Cardiology

## 2019-05-01 ENCOUNTER — Other Ambulatory Visit: Payer: Self-pay

## 2019-05-01 ENCOUNTER — Encounter: Payer: Self-pay | Admitting: Cardiology

## 2019-05-01 VITALS — BP 148/70 | HR 50 | Ht 70.0 in | Wt 247.0 lb

## 2019-05-01 DIAGNOSIS — Z7901 Long term (current) use of anticoagulants: Secondary | ICD-10-CM

## 2019-05-01 DIAGNOSIS — I110 Hypertensive heart disease with heart failure: Secondary | ICD-10-CM

## 2019-05-01 DIAGNOSIS — I2510 Atherosclerotic heart disease of native coronary artery without angina pectoris: Secondary | ICD-10-CM

## 2019-05-01 DIAGNOSIS — I5032 Chronic diastolic (congestive) heart failure: Secondary | ICD-10-CM | POA: Diagnosis not present

## 2019-05-01 DIAGNOSIS — E7800 Pure hypercholesterolemia, unspecified: Secondary | ICD-10-CM | POA: Diagnosis not present

## 2019-05-01 DIAGNOSIS — Z79899 Other long term (current) drug therapy: Secondary | ICD-10-CM

## 2019-05-01 DIAGNOSIS — I480 Paroxysmal atrial fibrillation: Secondary | ICD-10-CM | POA: Diagnosis not present

## 2019-05-01 MED ORDER — CARVEDILOL 3.125 MG PO TABS: 3.1250 mg | ORAL_TABLET | Freq: Two times a day (BID) | ORAL | 1 refills | Status: DC

## 2019-05-01 MED ORDER — CARVEDILOL 3.125 MG PO TABS: 3.1250 mg | ORAL_TABLET | Freq: Two times a day (BID) | ORAL | 2 refills | Status: DC

## 2019-05-01 MED ORDER — FUROSEMIDE 40 MG PO TABS: 40.0000 mg | ORAL_TABLET | Freq: Every day | ORAL | 2 refills | Status: DC | PRN

## 2019-05-01 MED ORDER — AMLODIPINE BESYLATE 5 MG PO TABS: 5.0000 mg | ORAL_TABLET | Freq: Every day | ORAL | 2 refills | Status: DC

## 2019-05-01 MED ORDER — AMIODARONE HCL 200 MG PO TABS: 200.0000 mg | ORAL_TABLET | Freq: Every day | ORAL | 2 refills | Status: DC

## 2019-05-01 MED ORDER — AMIODARONE HCL 200 MG PO TABS: 200.0000 mg | ORAL_TABLET | Freq: Every day | ORAL | 1 refills | Status: DC

## 2019-05-01 MED ORDER — FUROSEMIDE 40 MG PO TABS: ORAL_TABLET | ORAL | 1 refills | Status: DC

## 2019-05-01 NOTE — Progress Notes (Signed)
Cardiology Office Note:    Date:  05/01/2019   ID:  John Kemp, DOB Feb 04, 1946, MRN [id number]  PCP:  [name], MD  Cardiologist:  Shirlee More, MD    Referring MD: [name], MD    ASSESSMENT:    1. Chronic diastolic heart failure ([place])   2. PAF (paroxysmal atrial fibrillation) ([place])   3. Anticoagulated   4. On amiodarone therapy   5. CAD in native artery   6. Hypertensive heart disease with chronic diastolic congestive heart failure ([place])   7. Pure hypercholesterolemia    PLAN:    In order of problems listed above:  1. Heart failure is stable and he gets important he takes some dose of diuretic from ago she did 2 days a week along with daily weights stable at home and sodium restriction his wife is a retired Marine scientist and is very active and supervises his care 2. Atrial fibrillation stable he is maintaining sinus rhythm on low-dose amiodarone PCP checks labs every 6 months next visit we will plan a chest x-ray and continue the present amiodarone.  He also need to continue his anticoagulant without bleeding complication 3. Stable CAD no anginal discomfort New York Heart Association class I continue medical therapy including beta-blocker long-acting calcium channel blocker and high intensity statin 4. Hypertension stable BP at target continue current treatment including centrally active clonidine 5. Dyslipidemia stable lipids at target continue his high intensity statin without evidence of muscle toxicity   Next appointment: 6 mos   Medication Adjustments/Labs and Tests Ordered: Current medicines are reviewed at length with the patient today.  Concerns regarding medicines are outlined above.  Orders Placed This Encounter  Procedures  . EKG 12-Lead   Meds ordered this encounter  Medications  . DISCONTD: amiodarone (PACERONE) 200 MG tablet    Sig: Take 1 tablet (200 mg total) by mouth daily.    Dispense:  90 tablet    Refill:  2  . amLODipine (NORVASC) 5 MG  tablet    Sig: Take 1 tablet (5 mg total) by mouth daily.    Dispense:  90 tablet    Refill:  2  . DISCONTD: furosemide (LASIX) 40 MG tablet    Sig: Take 1 tablet (40 mg total) by mouth daily as needed for fluid.    Dispense:  90 tablet    Refill:  2  . DISCONTD: carvedilol (COREG) 3.125 MG tablet    Sig: Take 1 tablet (3.125 mg total) by mouth 2 (two) times daily with a meal.    Dispense:  180 tablet    Refill:  2  . furosemide (LASIX) 40 MG tablet    Sig: Take 1 tablet twice weekly on Wednesday and Saturday.    Dispense:  24 tablet    Refill:  1  . carvedilol (COREG) 3.125 MG tablet    Sig: Take 1 tablet (3.125 mg total) by mouth 2 (two) times daily with a meal.    Dispense:  180 tablet    Refill:  1  . amiodarone (PACERONE) 200 MG tablet    Sig: Take 1 tablet (200 mg total) by mouth daily.    Dispense:  90 tablet    Refill:  1    Chief Complaint  Patient presents with  . other    6 month f/u no complaints today. Meds reviewed verbally with pt.  . Atrial Fibrillation  . Congestive Heart Failure  . Coronary Artery Disease    History of Present Illness:  John Kemp is a 73 y.o. male with a hx of CAD, diastolic CHF, Atrial Fibrillation, HTN, S/P CABG and on amiodoarone  and a stroke with visual loss  last seen 10/29/2018. Compliance with diet, lifestyle and medications: yes  Overall John Kemp is doing well unfortunately he rarely takes a diuretic and we negotiated he would take his furosemide 1 to 2 days a week he has intermittent edema but no shortness of breath orthopnea chest pain palpitation or syncope tolerates amiodarone anticoagulant without perceived side effect and is pleased with the quality of his life recent labs are reviewed from his primary care physician performed 04/23/2019 shows cholesterol 149 LDL 90 HDL 36 A1c 5.3 hemoglobin 14.6 creatinine 1.34 potassium 4.0 TSH normal 1.44 on amiodarone and normal liver function test Past Medical History:  Diagnosis  Date  . A-fib ([place])   . Cancer ([place])    skin cancer  . Heart attack ([place])   . Hyperlipidemia   . Hypertension   . Hypothyroidism   . Stroke [facility])     Past Surgical History:  Procedure Laterality Date  . APPENDECTOMY  1956  . CARDIAC CATHETERIZATION    . CHOLECYSTECTOMY  2006  . CORONARY ARTERY BYPASS GRAFT  2001  . RADIOLOGY WITH ANESTHESIA Bilateral 01/18/2018   Procedure: MRI WITH ANESTHESIA LUMBAR SPINE WITH AND WITHOUT CONTRAST;  Surgeon: Radiologist, Medication, MD;  Location: [facility];  Service: Radiology;  Laterality: Bilateral;  . TONSILLECTOMY  1975    Current Medications: Current Meds  Medication Sig  . amiodarone (PACERONE) 200 MG tablet Take 1 tablet (200 mg total) by mouth daily.  Marland Kitchen amLODipine (NORVASC) 5 MG tablet Take 1 tablet (5 mg total) by mouth daily.  Marland Kitchen apixaban (ELIQUIS) 5 MG TABS tablet Take 5 mg by mouth 2 (two) times daily.  Marland Kitchen atorvastatin (LIPITOR) 10 MG tablet Take 10 mg by mouth at bedtime.  . carvedilol (COREG) 3.125 MG tablet Take 1 tablet (3.125 mg total) by mouth 2 (two) times daily with a meal.  . cloNIDine (CATAPRES) 0.1 MG tablet Take 0.1 mg by mouth 2 (two) times daily as needed (FOR BLOOD PRESSURE GREATER THAN 140/80).   . donepezil (ARICEPT) 10 MG tablet Take 1 tablet (10 mg total) by mouth at bedtime. Start 1/2 tablet daily x 4 weeks and then 1 tablet daily  . furosemide (LASIX) 40 MG tablet Take 1 tablet twice weekly on Wednesday and Saturday.  . levalbuterol (XOPENEX HFA) 45 MCG/ACT inhaler Inhale 2 puffs into the lungs every 4 (four) hours as needed for wheezing or shortness of breath.  . levothyroxine (SYNTHROID, LEVOTHROID) 137 MCG tablet Take 137 mcg by mouth daily before breakfast.   . meloxicam (MOBIC) 7.5 MG tablet Take 7.5 mg by mouth daily. Prescribed by PCP Dr. Benjamine Mola  . nitroGLYCERIN (NITROSTAT) 0.4 MG SL tablet Place 1 tablet (0.4 mg total) under the tongue every 5 (five) minutes as needed for chest pain.  . potassium chloride  (K-DUR,KLOR-CON) 10 MEQ tablet Take 10 mEq by mouth daily as needed (for fluid retention.).   Marland Kitchen valsartan (DIOVAN) 320 MG tablet Take 320 mg by mouth daily.  . Vitamin D, Ergocalciferol, (DRISDOL) 50000 units CAPS capsule Take 50,000 Units by mouth every Saturday. MORNING.  Marland Kitchen zolpidem (AMBIEN) 5 MG tablet Take 5 mg by mouth at bedtime.   . [DISCONTINUED] amiodarone (PACERONE) 200 MG tablet Take 1 tablet (200 mg total) by mouth daily.  . [DISCONTINUED] amiodarone (PACERONE) 200 MG tablet Take 1 tablet (200  mg total) by mouth daily.  . [DISCONTINUED] amLODipine (NORVASC) 5 MG tablet Take 5 mg by mouth daily.  . [DISCONTINUED] carvedilol (COREG) 3.125 MG tablet Take 3.125 mg by mouth 2 (two) times daily with a meal.  . [DISCONTINUED] carvedilol (COREG) 3.125 MG tablet Take 1 tablet (3.125 mg total) by mouth 2 (two) times daily with a meal.  . [DISCONTINUED] furosemide (LASIX) 40 MG tablet Take 40 mg by mouth daily as needed for fluid.   . [DISCONTINUED] furosemide (LASIX) 40 MG tablet Take 1 tablet (40 mg total) by mouth daily as needed for fluid.     Allergies:   Prednisone   Social History   Socioeconomic History  . Marital status: Married    Spouse name: Not on file  . Number of children: Not on file  . Years of education: Not on file  . Highest education level: Not on file  Occupational History  . Occupation: Retired  Scientific laboratory technician  . Financial resource strain: Not on file  . Food insecurity    Worry: Not on file    Inability: Not on file  . Transportation needs    Medical: Not on file    Non-medical: Not on file  Tobacco Use  . Smoking status: Former Smoker    Packs/day: 1.00    Years: 15.00    Pack years: 15.00    Types: Cigarettes    Quit date: 08/29/1978    Years since quitting: 40.6  . Smokeless tobacco: Never Used  Substance and Sexual Activity  . Alcohol use: No    Alcohol/week: 0.0 standard drinks  . Drug use: No  . Sexual activity: Not on file  Lifestyle  .  Physical activity    Days per week: Not on file    Minutes per session: Not on file  . Stress: Not on file  Relationships  . Social Herbalist on phone: Not on file    Gets together: Not on file    Attends religious service: Not on file    Active member of club or organization: Not on file    Attends meetings of clubs or organizations: Not on file    Relationship status: Not on file  Other Topics Concern  . Not on file  Social History Narrative  . Not on file     Family History: The patient's family history includes Breast cancer in his mother; Heart disease in his father. ROS:   Please see the history of present illness.    All other systems reviewed and are negative.  EKGs/Labs/Other Studies Reviewed:    The following studies were reviewed today:  EKG:  EKG ordered today and personally reviewed.  The ekg ordered today demonstrates sinus rhythm 50 bpm old inferior MI QT interval normal  Recent Labs: No results found for requested labs within last 8760 hours.  Recent Lipid Panel    Component Value Date/Time   CHOL 184 04/02/2017 0213   TRIG 176 (H) 04/02/2017 0213   HDL 31 (L) 04/02/2017 0213   CHOLHDL 5.9 04/02/2017 0213   VLDL 35 04/02/2017 0213   LDLCALC 118 (H) 04/02/2017 0213    Physical Exam:    VS:  BP (!) 148/70 (BP Location: Right Arm, Patient Position: Sitting, Cuff Size: Large)   Pulse (!) 50   Ht 5\' 10"  (1.778 m)   Wt 247 lb (112 kg)   SpO2 99%   BMI 35.44 kg/m     Wt Readings from Last  3 Encounters:  05/01/19 247 lb (112 kg)  03/26/19 246 lb 3.2 oz (111.7 kg)  10/29/18 238 lb 9.6 oz (108.2 kg)     GEN:  Well nourished, well developed in no acute distress HEENT: Normal NECK: No JVD; No carotid bruits LYMPHATICS: No lymphadenopathy CARDIAC: RRR, no murmurs, rubs, gallops RESPIRATORY:  Clear to auscultation without rales, wheezing or rhonchi  ABDOMEN: Soft, non-tender, non-distended MUSCULOSKELETAL:  No edema; No deformity  SKIN:  Warm and dry NEUROLOGIC:  Alert and oriented x 3 PSYCHIATRIC:  Normal affect    Signed, Shirlee More, MD  05/01/2019 9:56 AM    [place]

## 2019-05-01 NOTE — Patient Instructions (Signed)
Medication Instructions:  Your physician has recommended you make the following change in your medication:   INCREASE furosemide (lasix) 40 mg: Take 1 tablet twice weekly on Wednesday and Saturday.   If you need a refill on your cardiac medications before your next appointment, please call your pharmacy.   Lab work: None  If you have labs (blood work) drawn today and your tests are completely normal, you will receive your results only by: Marland Kitchen MyChart Message (if you have MyChart) OR . A paper copy in the mail If you have any lab test that is abnormal or we need to change your treatment, we will call you to review the results.  Testing/Procedures: You had an EKG today.   Follow-Up: At [facility], you and your health needs are our priority.  As part of our continuing mission to provide you with exceptional heart care, we have created designated Provider Care Teams.  These Care Teams include your primary Cardiologist (physician) and Advanced Practice Providers (APPs -  Physician Assistants and Nurse Practitioners) who all work together to provide you with the care you need, when you need it. You will need a follow up appointment in 6 months.  Please call our office 2 months in advance to schedule this appointment.

## 2019-05-28 ENCOUNTER — Telehealth: Payer: Self-pay

## 2019-05-28 ENCOUNTER — Other Ambulatory Visit: Payer: Self-pay | Admitting: Neurology

## 2019-05-28 NOTE — Telephone Encounter (Signed)
Note   I called pt and wife to discuss a refill request on aricept. I stated per Janett Billow NP last note his PCP can manage the medication ongoing if they agree.. Pt stated he has seen the PA at his PCP and they will refill the aricept ongoing. Pt stated he does not need a refill. Pt appreciate the call and was doing well on the medication.He will follow up with PCP ongoing for aricept refills.

## 2019-05-28 NOTE — Telephone Encounter (Signed)
I called pt and wife to discuss a refill request on aricept. I stated per Janett Billow NP last note his PCP can manage the medication ongoing. Pt stated he has seen the PA at his PCP and they will refill the aricept ongoing. Pt stated he does not need a refill. Pt appreciate the call and was doing well on the medication.

## 2019-06-04 DIAGNOSIS — Z23 Encounter for immunization: Secondary | ICD-10-CM | POA: Diagnosis not present

## 2019-08-05 DIAGNOSIS — I110 Hypertensive heart disease with heart failure: Secondary | ICD-10-CM | POA: Diagnosis not present

## 2019-08-05 DIAGNOSIS — I480 Paroxysmal atrial fibrillation: Secondary | ICD-10-CM | POA: Diagnosis not present

## 2019-08-05 DIAGNOSIS — R7301 Impaired fasting glucose: Secondary | ICD-10-CM | POA: Diagnosis not present

## 2019-08-05 DIAGNOSIS — I2510 Atherosclerotic heart disease of native coronary artery without angina pectoris: Secondary | ICD-10-CM | POA: Diagnosis not present

## 2019-08-05 DIAGNOSIS — J449 Chronic obstructive pulmonary disease, unspecified: Secondary | ICD-10-CM | POA: Diagnosis not present

## 2019-08-05 DIAGNOSIS — E782 Mixed hyperlipidemia: Secondary | ICD-10-CM | POA: Diagnosis not present

## 2019-08-05 DIAGNOSIS — I5032 Chronic diastolic (congestive) heart failure: Secondary | ICD-10-CM | POA: Diagnosis not present

## 2019-08-05 DIAGNOSIS — E034 Atrophy of thyroid (acquired): Secondary | ICD-10-CM | POA: Diagnosis not present

## 2019-08-06 ENCOUNTER — Encounter: Payer: Self-pay | Admitting: Cardiology

## 2019-10-22 ENCOUNTER — Other Ambulatory Visit: Payer: Self-pay

## 2019-10-22 MED ORDER — LEVOTHYROXINE SODIUM 137 MCG PO TABS: 137.0000 ug | ORAL_TABLET | Freq: Every day | ORAL | 0 refills | Status: DC

## 2019-10-22 MED ORDER — VITAMIN D (ERGOCALCIFEROL) 1.25 MG (50000 UNIT) PO CAPS: 50000.0000 [IU] | ORAL_CAPSULE | ORAL | 0 refills | Status: DC

## 2019-10-23 ENCOUNTER — Other Ambulatory Visit: Payer: Self-pay

## 2019-10-23 MED ORDER — LEVOTHYROXINE SODIUM 137 MCG PO TABS: 137.0000 ug | ORAL_TABLET | Freq: Every day | ORAL | 0 refills | Status: DC

## 2019-10-23 MED ORDER — VITAMIN D (ERGOCALCIFEROL) 1.25 MG (50000 UNIT) PO CAPS: 50000.0000 [IU] | ORAL_CAPSULE | ORAL | 0 refills | Status: DC

## 2019-11-05 ENCOUNTER — Ambulatory Visit (INDEPENDENT_AMBULATORY_CARE_PROVIDER_SITE_OTHER): Payer: Medicare Other | Admitting: Family Medicine

## 2019-11-05 ENCOUNTER — Other Ambulatory Visit: Payer: Self-pay

## 2019-11-05 VITALS — BP 110/64 | HR 56 | Temp 97.8°F | Resp 16 | Ht 68.0 in | Wt 250.0 lb

## 2019-11-05 DIAGNOSIS — IMO0002 Reserved for concepts with insufficient information to code with codable children: Secondary | ICD-10-CM | POA: Insufficient documentation

## 2019-11-05 DIAGNOSIS — I5032 Chronic diastolic (congestive) heart failure: Secondary | ICD-10-CM

## 2019-11-05 DIAGNOSIS — E782 Mixed hyperlipidemia: Secondary | ICD-10-CM | POA: Diagnosis not present

## 2019-11-05 DIAGNOSIS — I679 Cerebrovascular disease, unspecified: Secondary | ICD-10-CM

## 2019-11-05 DIAGNOSIS — I6930 Unspecified sequelae of cerebral infarction: Secondary | ICD-10-CM

## 2019-11-05 DIAGNOSIS — I482 Chronic atrial fibrillation, unspecified: Secondary | ICD-10-CM

## 2019-11-05 DIAGNOSIS — D688 Other specified coagulation defects: Secondary | ICD-10-CM

## 2019-11-05 DIAGNOSIS — H539 Unspecified visual disturbance: Secondary | ICD-10-CM | POA: Diagnosis not present

## 2019-11-05 DIAGNOSIS — I110 Hypertensive heart disease with heart failure: Secondary | ICD-10-CM | POA: Diagnosis not present

## 2019-11-05 DIAGNOSIS — I10 Essential (primary) hypertension: Secondary | ICD-10-CM | POA: Diagnosis not present

## 2019-11-05 DIAGNOSIS — E039 Hypothyroidism, unspecified: Secondary | ICD-10-CM | POA: Diagnosis not present

## 2019-11-05 DIAGNOSIS — I509 Heart failure, unspecified: Secondary | ICD-10-CM | POA: Insufficient documentation

## 2019-11-05 HISTORY — DX: Reserved for concepts with insufficient information to code with codable children: IMO0002

## 2019-11-05 HISTORY — DX: Heart failure, unspecified: I50.9

## 2019-11-05 NOTE — Progress Notes (Signed)
Subjective:  Patient ID: John Kemp, male    DOB: [birth year]  Age: 74 y.o. MRN: [id number]  Chief Complaint  Patient presents with  . Hypertension  . Hyperlipidemia    HPI Pt presents for follow up of hypertension.  Date of diagnosis 03/2013.  His current cardiac medication regimen includes a diuretic ( Lasix 40 mg twice a week ), a beta-blocker ( Coreg 3.125 mg BID ), a calcium channel blocker ( Norvasc 5 mg QD ), an angiotensin receptor blocker ( Diovan ), Amiodarone 200 mg QD, and Clonidine 0.1 mg BID prn if blood pressure is > 140/90. Compliance with treatment has been good; he takes his medication as directed, although does not eat healthy or exercise, and follows up as directed.  Concurrent health problems include hyperlipidemia, coronary artery disease, Persistent atrial fibrillation, and congestive heart failure.      Additionally, he presents with history of atherosclerotic heart disease of native coronary artery without angina pectoris.  he is here today for routine follow-up.  John Kemp has a prior history of a myocardial infarction and is currently on a beta blocker.  His heart disease was first diagnosed > 10 years ago.  Currently, his treatment regimen consists of daily 81 mg aspirin,  Coreg, and Eliquis.  No associated symptoms are reported.  Current level of activity includes ability to walk.  Depression screen is performed and is negative.   A cardiac cath has been done ( performed 2002 ).   Chronic atrial fibrillation details; he is here for routine follow-up for atrial fibrillation. Current related medications include amiodarone,  a beta blocker for rate control, and Eliquis.   He is extremely compliant with his medication regimen.   He denies any awareness of a disturbance in heart rate or rhythm. There has been a prior attempt at electrical cardioversion. This resulted in a brief conversion to normal sinus rhythm with a return to atrial fibrillation.       Dx with mixed  hyperlipidemia; date of diagnosis 05/09/2013.  Current treatment includes Lipitor.      Additionally, he presents with history of chronic obstructive pulmonary disease diagnosed several years. It is associated with dyspnea.  He has xopenex which he uses at times. John Kemp is an ex-smoker; he quit in 1985.       In regard to the chronic diastolic (congestive) heart failure, it was first diagnosed in 2002.  The course of the disease has been stable.  Currently, his treatment regimen consists of a diuretic ( furosemide ), Coreg, and Amiodarone.  He tries to take medications faithfully and weigh himself daily.  .      Concerning atrophy of thyroid (acquired), date of diagnosis 12/22/2014.  He is currently taking Synthroid, 137 mcg daily. He reports no symptoms suggestive of adverse medication effect.      Patient had a stroke in February 2018 and has a right superior quadrantanopsia.  John Kemp presents with a diagnosis of impaired fasting glucose.  This was diagnosed 02/2015. His last HbA1c on 08/05/2019 was at goal at 5.3%.     Social History   Socioeconomic History  . Marital status: Married    Spouse name: Not on file  . Number of children: Not on file  . Years of education: Not on file  . Highest education level: Not on file  Occupational History  . Occupation: Retired  Tobacco Use  . Smoking status: Former Smoker    Packs/day: 1.00    Years: 15.00  Pack years: 15.00    Types: Cigarettes    Quit date: 08/29/1978    Years since quitting: 41.2  . Smokeless tobacco: Never Used  Substance and Sexual Activity  . Alcohol use: No    Alcohol/week: 0.0 standard drinks  . Drug use: No  . Sexual activity: Not on file  Other Topics Concern  . Not on file  Social History Narrative  . Not on file   Social Determinants of Health   Financial Resource Strain:   . Difficulty of Paying Living Expenses:   Food Insecurity:   . Worried About Charity fundraiser in the Last Year:   . Arboriculturist  in the Last Year:   Transportation Needs:   . Film/video editor (Medical):   Marland Kitchen Lack of Transportation (Non-Medical):   Physical Activity:   . Days of Exercise per Week:   . Minutes of Exercise per Session:   Stress:   . Feeling of Stress :   Social Connections:   . Frequency of Communication with Friends and Family:   . Frequency of Social Gatherings with Friends and Family:   . Attends Religious Services:   . Active Member of Clubs or Organizations:   . Attends Archivist Meetings:   Marland Kitchen Marital Status:    Past Medical History:  Diagnosis Date  . A-fib ([place])   . Anticoagulated 01/21/2015  . Atherosclerotic heart disease of native coronary artery without angina pectoris   . CAD in native artery 01/21/2015  . Cancer ([place])    skin cancer  . Chest pain 04/02/2017  . CHF (congestive heart failure) ([place]) 11/05/2019  . Chronic diastolic (congestive) heart failure ([place])   . Chronic diastolic heart failure ([place]) 01/21/2015   Last Assessment & Plan:  Is congestive heart failure is mildly decompensated he is edematous he'll increase the dose of his diuretic today the lab work performed including an ARB referred to care transitions for home care he'll follow-up in my office with me in 4-6 weeks daily weights sodium restrictionand compliance of medications beinghemoglobin of will  . Chronic obstructive pulmonary disease (COPD) ([place])   . Dyspnea 12/31/2014   Followed in Pulmonary clinic/ [facility]/ [place]  - amiodarone rx 11/2014 >>> - PFTs rec 12/31/2014    . Heart attack ([place])   . High cholesterol 10/20/2017  . Hx of CABG 03/02/2017  . Hyperlipidemia   . Hypertension   . Hypertensive heart disease 01/21/2015  . Hypertensive heart disease with heart failure ([place])   . Hypothyroidism   . Hypothyroidism (acquired) 04/24/2018  . Impaired fasting glucose   . Leg fatigue 03/03/2017  . Obesity 04/02/2017  . On amiodarone therapy 01/21/2015  . PAF (paroxysmal atrial fibrillation) ([place])   .  Primary insomnia   . Squamous cell carcinoma 11/05/2019  . Stroke (cerebrum) ([place]) 09/2016   right superior quadrantanopsia  . Stroke ([place])   . Typical atrial flutter ([employer]) 01/21/2015  . Vitamin D deficiency, unspecified    Family History  Problem Relation Age of Onset  . Heart disease Father   . Arrhythmia Father   . Breast cancer Mother   . Diabetes type II Sister     Review of Systems  Constitutional: Negative for chills, fatigue and fever.  HENT: Negative for congestion, ear pain and sore throat.   Respiratory: Positive for shortness of breath. Negative for cough.        With exertion.  Cardiovascular: Negative for chest pain.  Gastrointestinal:  Negative for abdominal pain, constipation, diarrhea, nausea and vomiting.  Endocrine: Negative for polydipsia, polyphagia and polyuria.  Genitourinary: Negative for dysuria and frequency.  Musculoskeletal: Negative for arthralgias and myalgias.  Neurological: Negative for dizziness and headaches.  Psychiatric/Behavioral: Negative for dysphoric mood.       No dysphoria     Objective:  BP 110/64   Pulse (!) 56   Temp 97.8 F (36.6 C)   Resp 16   Ht 5\' 8"  (1.727 m)   Wt 250 lb (113.4 kg)   BMI 38.01 kg/m   BP/Weight 11/12/2019 0000000 [id number]  Systolic BP [id number]  Diastolic BP 60 64 70  Wt. (Lbs) 254.2 250 247  BMI 38.65 38.01 35.44    Physical Exam Vitals reviewed.  Constitutional:      Appearance: Normal appearance. He is obese.  Eyes:     Comments: Rt upper quadrant BL loss of vision.  Neck:     Vascular: No carotid bruit.  Cardiovascular:     Rate and Rhythm: Normal rate and regular rhythm.     Pulses: Normal pulses.     Heart sounds: Normal heart sounds.  Pulmonary:     Effort: Pulmonary effort is normal.     Breath sounds: Normal breath sounds. No wheezing, rhonchi or rales.  Abdominal:     General: Bowel sounds are normal.     Palpations: Abdomen is soft.     Tenderness: There is no abdominal  tenderness.  Neurological:     Mental Status: He is alert.  Psychiatric:        Mood and Affect: Mood normal.        Behavior: Behavior normal.     Lab Results  Component Value Date   WBC 6.4 11/05/2019   HGB 15.6 11/05/2019   HCT 47.2 11/05/2019   PLT 212 11/05/2019   GLUCOSE 95 11/05/2019   CHOL 142 11/05/2019   TRIG 112 11/05/2019   HDL 37 (L) 11/05/2019   LDLCALC 84 11/05/2019   ALT 20 11/05/2019   AST 21 11/05/2019   NA 141 11/05/2019   K 4.5 11/05/2019   CL 102 11/05/2019   CREATININE 1.17 11/05/2019   BUN 16 11/05/2019   CO2 26 11/05/2019   TSH 0.843 10/23/2017   MICROALBUR Positive 150 01/23/2019      Assessment & Plan:  1. Mixed hyperlipidemia Recommend low fat diet and exercise. The current medical regimen is effective;  continue present plan and medications. - Lipid panel  2. Chronic diastolic congestive heart failure ([place]) Well controlled.  No changes to medicines.  Continue to work on eating a healthy diet and exercise.  Labs drawn today.   3. Hypertensive heart disease with chronic diastolic congestive heart failure ([place]) Well controlled.  No changes to medicines.  Continue to work on eating a healthy diet and exercise.  I would like your goal to be to lose 15 lbs by next visit in 3 months Labs drawn today.  - CBC with Differential/Platelet - Comprehensive metabolic panel - Cardiovascular Risk Assessment  4. Hereditary thrombophilia ([place]) due to eliquis.  5. [place] of cardioembolic stroke The current medical regimen is effective;  continue present plan and medications.  6. 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 Stable. Continue current medicines.  7. Chronic atrial fibrillation ([facility]) Stable. The current medical regimen is effective;  continue present plan and medications.  8. Hypothyroidism (acquired) Well controlled.  No changes to medicines.    Follow-up: Return in about 3  months (around 02/05/2020).  AVS was  given to patient prior to departure.  [name] [facility] [phone]

## 2019-11-05 NOTE — Patient Instructions (Signed)
Work on eating healthy and exercise! I would like your goal to be to lose 15 lbs by next visit in 3 months.  The current medical regimen is effective;  continue present plan and medications.  DASH Eating Plan DASH stands for "Dietary Approaches to Stop Hypertension." The DASH eating plan is a healthy eating plan that has been shown to reduce high blood pressure (hypertension). It may also reduce your risk for type 2 diabetes, heart disease, and stroke. The DASH eating plan may also help with weight loss. What are tips for following this plan?  General guidelines  Avoid eating more than 2,300 mg (milligrams) of salt (sodium) a day. If you have hypertension, you may need to reduce your sodium intake to 1,500 mg a day.  Limit alcohol intake to no more than 1 drink a day for nonpregnant women and 2 drinks a day for men. One drink equals 12 oz of beer, 5 oz of wine, or 1 oz of hard liquor.  Work with your health care provider to maintain a healthy body weight or to lose weight. Ask what an ideal weight is for you.  Get at least 30 minutes of exercise that causes your heart to beat faster (aerobic exercise) most days of the week. Activities may include walking, swimming, or biking.  Work with your health care provider or diet and nutrition specialist (dietitian) to adjust your eating plan to your individual calorie needs. Reading food labels   Check food labels for the amount of sodium per serving. Choose foods with less than 5 percent of the Daily Value of sodium. Generally, foods with less than 300 mg of sodium per serving fit into this eating plan.  To find whole grains, look for the word "whole" as the first word in the ingredient list. Shopping  Buy products labeled as "low-sodium" or "no salt added."  Buy fresh foods. Avoid canned foods and premade or frozen meals. Cooking  Avoid adding salt when cooking. Use salt-free seasonings or herbs instead of table salt or sea salt. Check with  your health care provider or pharmacist before using salt substitutes.  Do not fry foods. Cook foods using healthy methods such as baking, boiling, grilling, and broiling instead.  Cook with heart-healthy oils, such as olive, canola, soybean, or sunflower oil. Meal planning  Eat a balanced diet that includes: ? 5 or more servings of fruits and vegetables each day. At each meal, try to fill half of your plate with fruits and vegetables. ? Up to 6-8 servings of whole grains each day. ? Less than 6 oz of lean meat, poultry, or fish each day. A 3-oz serving of meat is about the same size as a deck of cards. One egg equals 1 oz. ? 2 servings of low-fat dairy each day. ? A serving of nuts, seeds, or beans 5 times each week. ? Heart-healthy fats. Healthy fats called Omega-3 fatty acids are found in foods such as flaxseeds and coldwater fish, like sardines, salmon, and mackerel.  Limit how much you eat of the following: ? Canned or prepackaged foods. ? Food that is high in trans fat, such as fried foods. ? Food that is high in saturated fat, such as fatty meat. ? Sweets, desserts, sugary drinks, and other foods with added sugar. ? Full-fat dairy products.  Do not salt foods before eating.  Try to eat at least 2 vegetarian meals each week.  Eat more home-cooked food and less restaurant, buffet, and fast food.  When eating at a restaurant, ask that your food be prepared with less salt or no salt, if possible. What foods are recommended? The items listed may not be a complete list. Talk with your dietitian about what dietary choices are best for you. Grains Whole-grain or whole-wheat bread. Whole-grain or whole-wheat pasta. Brown rice. [place]. Bulgur. Whole-grain and low-sodium cereals. Pita bread. Low-fat, low-sodium crackers. Whole-wheat flour tortillas. Vegetables Fresh or frozen vegetables (raw, steamed, roasted, or grilled). Low-sodium or reduced-sodium tomato and vegetable  juice. Low-sodium or reduced-sodium tomato sauce and tomato paste. Low-sodium or reduced-sodium canned vegetables. Fruits All fresh, dried, or frozen fruit. Canned fruit in natural juice (without added sugar). Meat and other protein foods Skinless chicken or [place]. Ground chicken or [place]. Pork with fat trimmed off. Fish and seafood. Egg whites. Dried beans, peas, or lentils. Unsalted nuts, nut butters, and seeds. Unsalted canned beans. Lean cuts of beef with fat trimmed off. Low-sodium, lean deli meat. Dairy Low-fat (1%) or fat-free (skim) milk. Fat-free, low-fat, or reduced-fat cheeses. Nonfat, low-sodium ricotta or cottage cheese. Low-fat or nonfat yogurt. Low-fat, low-sodium cheese. Fats and oils Soft margarine without trans fats. Vegetable oil. Low-fat, reduced-fat, or light mayonnaise and salad dressings (reduced-sodium). Canola, safflower, olive, soybean, and sunflower oils. Avocado. Seasoning and other foods Herbs. Spices. Seasoning mixes without salt. Unsalted popcorn and pretzels. Fat-free sweets. What foods are not recommended? The items listed may not be a complete list. Talk with your dietitian about what dietary choices are best for you. Grains Baked goods made with fat, such as croissants, muffins, or some breads. Dry pasta or rice meal packs. Vegetables Creamed or fried vegetables. Vegetables in a cheese sauce. Regular canned vegetables (not low-sodium or reduced-sodium). Regular canned tomato sauce and paste (not low-sodium or reduced-sodium). Regular tomato and vegetable juice (not low-sodium or reduced-sodium). Angie Fava. Olives. Fruits Canned fruit in a light or heavy syrup. Fried fruit. Fruit in cream or butter sauce. Meat and other protein foods Fatty cuts of meat. Ribs. Fried meat. Berniece Salines. Sausage. Bologna and other processed lunch meats. Salami. Fatback. Hotdogs. Bratwurst. Salted nuts and seeds. Canned beans with added salt. Canned or smoked fish. Whole eggs or egg yolks.  Chicken or [place] with skin. Dairy Whole or 2% milk, cream, and half-and-half. Whole or full-fat cream cheese. Whole-fat or sweetened yogurt. Full-fat cheese. Nondairy creamers. Whipped toppings. Processed cheese and cheese spreads. Fats and oils Butter. Stick margarine. Lard. Shortening. Ghee. Bacon fat. Tropical oils, such as coconut, palm kernel, or palm oil. Seasoning and other foods Salted popcorn and pretzels. Onion salt, garlic salt, seasoned salt, table salt, and sea salt. Worcestershire sauce. Tartar sauce. Barbecue sauce. Teriyaki sauce. Soy sauce, including reduced-sodium. Steak sauce. Canned and packaged gravies. Fish sauce. Oyster sauce. Cocktail sauce. Horseradish that you find on the shelf. Ketchup. Mustard. Meat flavorings and tenderizers. Bouillon cubes. Hot sauce and Tabasco sauce. Premade or packaged marinades. Premade or packaged taco seasonings. Relishes. Regular salad dressings. Where to find more information:  National Heart, Lung, and [place]: https://wilson-eaton.com/  American Heart Association: www.heart.org Summary  The DASH eating plan is a healthy eating plan that has been shown to reduce high blood pressure (hypertension). It may also reduce your risk for type 2 diabetes, heart disease, and stroke.  With the DASH eating plan, you should limit salt (sodium) intake to 2,300 mg a day. If you have hypertension, you may need to reduce your sodium intake to 1,500 mg a day.  When on the DASH eating plan,  aim to eat more fresh fruits and vegetables, whole grains, lean proteins, low-fat dairy, and heart-healthy fats.  Work with your health care provider or diet and nutrition specialist (dietitian) to adjust your eating plan to your individual calorie needs. This information is not intended to replace advice given to you by your health care provider. Make sure you discuss any questions you have with your health care provider. Document Revised: 07/28/2017 Document Reviewed:  08/08/2016 Elsevier Patient Education  2020 [employer].

## 2019-11-06 ENCOUNTER — Encounter: Payer: Self-pay | Admitting: Family Medicine

## 2019-11-06 NOTE — Progress Notes (Deleted)
Established Patient Office Visit  Subjective:  Patient ID: John Kemp, male    DOB: [birth year]  Age: 74 y.o. MRN: [id number]  CC: No chief complaint on file.   HPI John Kemp presents for ***  Past Medical History:  Diagnosis Date  . A-fib ([place])   . Atherosclerotic heart disease of native coronary artery without angina pectoris   . Cancer ([place])    skin cancer  . Chronic diastolic (congestive) heart failure ([place])   . Chronic obstructive pulmonary disease (COPD) ([place])   . Heart attack ([place])   . Hyperlipidemia   . Hypertension   . Hypertensive heart disease with heart failure ([place])   . Hypothyroidism   . Impaired fasting glucose   . Primary insomnia   . Stroke ([place])   . Vitamin D deficiency, unspecified     Past Surgical History:  Procedure Laterality Date  . APPENDECTOMY  1956  . CARDIAC CATHETERIZATION    . CARDIOVERSION  2002  . CHOLECYSTECTOMY  2006  . CORONARY ARTERY BYPASS GRAFT  2001  . HERNIA REPAIR    . RADIOLOGY WITH ANESTHESIA Bilateral 01/18/2018   Procedure: MRI WITH ANESTHESIA LUMBAR SPINE WITH AND WITHOUT CONTRAST;  Surgeon: Radiologist, Medication, MD;  Location: [facility];  Service: Radiology;  Laterality: Bilateral;  . ROTATOR CUFF REPAIR Left   . TONSILLECTOMY  1975    Family History  Problem Relation Age of Onset  . Heart disease Father   . Arrhythmia Father   . Breast cancer Mother   . Diabetes type II Sister     Social History   Socioeconomic History  . Marital status: Married    Spouse name: Not on file  . Number of children: Not on file  . Years of education: Not on file  . Highest education level: Not on file  Occupational History  . Occupation: Retired  Tobacco Use  . Smoking status: Former Smoker    Packs/day: 1.00    Years: 15.00    Pack years: 15.00    Types: Cigarettes    Quit date: 08/29/1978    Years since quitting: 41.2  . Smokeless tobacco: Never Used  Substance and Sexual Activity  . Alcohol use: No   Alcohol/week: 0.0 standard drinks  . Drug use: No  . Sexual activity: Not on file  Other Topics Concern  . Not on file  Social History Narrative  . Not on file   Social Determinants of Health   Financial Resource Strain:   . Difficulty of Paying Living Expenses: Not on file  Food Insecurity:   . Worried About Charity fundraiser in the Last Year: Not on file  . Ran Out of Food in the Last Year: Not on file  Transportation Needs:   . Lack of Transportation (Medical): Not on file  . Lack of Transportation (Non-Medical): Not on file  Physical Activity:   . Days of Exercise per Week: Not on file  . Minutes of Exercise per Session: Not on file  Stress:   . Feeling of Stress : Not on file  Social Connections:   . Frequency of Communication with Friends and Family: Not on file  . Frequency of Social Gatherings with Friends and Family: Not on file  . Attends Religious Services: Not on file  . Active Member of Clubs or Organizations: Not on file  . Attends Archivist Meetings: Not on file  . Marital Status: Not on file  Intimate Partner Violence:   .  Fear of Current or Ex-Partner: Not on file  . Emotionally Abused: Not on file  . Physically Abused: Not on file  . Sexually Abused: Not on file    Outpatient Medications Prior to Visit  Medication Sig Dispense Refill  . amiodarone (PACERONE) 200 MG tablet Take 1 tablet (200 mg total) by mouth daily. 90 tablet 1  . apixaban (ELIQUIS) 5 MG TABS tablet Take 5 mg by mouth 2 (two) times daily.    Marland Kitchen atorvastatin (LIPITOR) 10 MG tablet Take 10 mg by mouth at bedtime.    . carvedilol (COREG) 3.125 MG tablet Take 1 tablet (3.125 mg total) by mouth 2 (two) times daily with a meal. 180 tablet 1  . cloNIDine (CATAPRES) 0.1 MG tablet Take 0.1 mg by mouth 2 (two) times daily as needed (FOR BLOOD PRESSURE GREATER THAN 140/80).     . donepezil (ARICEPT) 10 MG tablet Take 1 tablet (10 mg total) by mouth at bedtime. Start 1/2 tablet daily x 4  weeks and then 1 tablet daily 90 tablet 1  . furosemide (LASIX) 40 MG tablet Take 1 tablet twice weekly on Wednesday and Saturday. 24 tablet 1  . Inulin (FIBER CHOICE PO) Take 3 tablets by mouth daily.    Marland Kitchen ipratropium (ATROVENT) 0.02 % nebulizer solution Take 0.5 mg by nebulization 4 (four) times daily.    Marland Kitchen levalbuterol (XOPENEX HFA) 45 MCG/ACT inhaler Inhale 2 puffs into the lungs every 4 (four) hours as needed for wheezing or shortness of breath.    . levothyroxine (SYNTHROID) 137 MCG tablet Take 1 tablet (137 mcg total) by mouth daily before breakfast. 90 tablet 0  . meloxicam (MOBIC) 7.5 MG tablet Take 7.5 mg by mouth daily. Prescribed by PCP Dr. Benjamine Mola    . potassium chloride (K-DUR,KLOR-CON) 10 MEQ tablet Take 10 mEq by mouth daily as needed (for fluid retention.).     Marland Kitchen valsartan (DIOVAN) 320 MG tablet Take 320 mg by mouth daily.    Marland Kitchen zolpidem (AMBIEN) 10 MG tablet Take 10 mg by mouth at bedtime.    . nitroGLYCERIN (NITROSTAT) 0.4 MG SL tablet Place 1 tablet (0.4 mg total) under the tongue every 5 (five) minutes as needed for chest pain. 25 tablet 5  . Vitamin D, Ergocalciferol, (DRISDOL) 1.25 MG (50000 UNIT) CAPS capsule Take 1 capsule (50,000 Units total) by mouth every Saturday. MORNING. 12 capsule 0  . amLODipine (NORVASC) 5 MG tablet Take 1 tablet (5 mg total) by mouth daily. 90 tablet 2  . dicyclomine (BENTYL) 10 MG capsule Take 10 mg by mouth 4 (four) times daily -  before meals and at bedtime.     No facility-administered medications prior to visit.    Allergies  Allergen Reactions  . Avelox [Moxifloxacin Hcl]   . Cartia Xt [Diltiazem Hcl] Nausea And Vomiting  . Pravachol [Pravastatin Sodium] Other (See Comments)    myalgia  . Prednisone Other (See Comments)    "makes me feel terrible"  . Zetia [Ezetimibe] Other (See Comments)    myalgias    ROS Review of Systems  Constitutional: Negative for chills and fever.  HENT: Negative for congestion, ear pain and sore  throat.   Respiratory: Negative for cough and shortness of breath.   Cardiovascular: Negative for chest pain and palpitations.  Gastrointestinal: Negative for abdominal pain, constipation, diarrhea, nausea and vomiting.  Genitourinary: Negative.   Psychiatric/Behavioral: Negative for dysphoric mood. The patient is not nervous/anxious.       Objective:  Physical Exam  BP 110/64   Pulse (!) 56   Temp 97.8 F (36.6 C)   Resp 16   Ht 5\' 8"  (1.727 m)   Wt 250 lb (113.4 kg)   BMI 38.01 kg/m  Wt Readings from Last 3 Encounters:  11/05/19 250 lb (113.4 kg)  05/01/19 247 lb (112 kg)  03/26/19 246 lb 3.2 oz (111.7 kg)     Health Maintenance Due  Topic Date Due  . Hepatitis C Screening  May 25, 1946    There are no preventive care reminders to display for this patient.  Lab Results  Component Value Date   TSH 0.843 10/23/2017   Lab Results  Component Value Date   WBC 6.1 01/18/2018   HGB 13.6 01/18/2018   HCT 39.3 01/18/2018   MCV 89.5 01/18/2018   PLT 168 01/18/2018   Lab Results  Component Value Date   NA 140 01/18/2018   K 3.7 01/18/2018   CO2 27 01/18/2018   GLUCOSE 102 (H) 01/18/2018   BUN 17 01/18/2018   CREATININE 1.32 (H) 01/18/2018   BILITOT 0.8 03/03/2017   ALKPHOS 91 03/03/2017   AST 23 03/03/2017   ALT 22 03/03/2017   PROT 6.6 10/23/2017   ALBUMIN 4.2 03/03/2017   CALCIUM 8.8 (L) 01/18/2018   ANIONGAP 8 01/18/2018   Lab Results  Component Value Date   CHOL 184 04/02/2017   Lab Results  Component Value Date   HDL 31 (L) 04/02/2017   Lab Results  Component Value Date   LDLCALC 118 (H) 04/02/2017   Lab Results  Component Value Date   TRIG 176 (H) 04/02/2017   Lab Results  Component Value Date   CHOLHDL 5.9 04/02/2017   No results found for: HGBA1C    Assessment & Plan:   Problem List Items Addressed This Visit    None    Visit Diagnoses    Mixed hyperlipidemia    -  Primary   Relevant Orders   Lipid panel   Essential  hypertension, benign       Relevant Orders   CBC with Differential/Platelet   Comprehensive metabolic panel      No orders of the defined types were placed in this encounter.   Follow-up: Return in about 3 months (around 02/05/2020).    Arsenio Katz, CMA

## 2019-11-07 LAB — COMPREHENSIVE METABOLIC PANEL
ALT: 20 IU/L (ref 0–44)
AST: 21 IU/L (ref 0–40)
Albumin/Globulin Ratio: 1.9 (ref 1.2–2.2)
Albumin: 4.4 g/dL (ref 3.7–4.7)
Alkaline Phosphatase: 111 IU/L (ref 39–117)
BUN/Creatinine Ratio: 14 (ref 10–24)
BUN: 16 mg/dL (ref 8–27)
Bilirubin Total: 1 mg/dL (ref 0.0–1.2)
CO2: 26 mmol/L (ref 20–29)
Calcium: 9.4 mg/dL (ref 8.6–10.2)
Chloride: 102 mmol/L (ref 96–106)
Creatinine, Ser: 1.17 mg/dL (ref 0.76–1.27)
GFR calc Af Amer: 72 mL/min/{1.73_m2} (ref >59)
GFR calc non Af Amer: 62 mL/min/{1.73_m2} (ref >59)
Globulin, Total: 2.3 g/dL (ref 1.5–4.5)
Glucose: 95 mg/dL (ref 65–99)
Potassium: 4.5 mmol/L (ref 3.5–5.2)
Sodium: 141 mmol/L (ref 134–144)
Total Protein: 6.7 g/dL (ref 6.0–8.5)

## 2019-11-07 LAB — CBC WITH DIFFERENTIAL/PLATELET
Basophils Absolute: 0.1 10*3/uL (ref 0.0–0.2)
Basos: 1 %
EOS (ABSOLUTE): 0.2 10*3/uL (ref 0.0–0.4)
Eos: 3 %
Hematocrit: 47.2 % (ref 37.5–51.0)
Hemoglobin: 15.6 g/dL (ref 13.0–17.7)
Immature Grans (Abs): 0 10*3/uL (ref 0.0–0.1)
Immature Granulocytes: 1 %
Lymphocytes Absolute: 1.6 10*3/uL (ref 0.7–3.1)
Lymphs: 25 %
MCH: 30.9 pg (ref 26.6–33.0)
MCHC: 33.1 g/dL (ref 31.5–35.7)
MCV: 94 fL (ref 79–97)
Monocytes Absolute: 0.8 10*3/uL (ref 0.1–0.9)
Monocytes: 13 %
Neutrophils Absolute: 3.7 10*3/uL (ref 1.4–7.0)
Neutrophils: 57 %
Platelets: 212 10*3/uL (ref 150–450)
RBC: 5.05 x10E6/uL (ref 4.14–5.80)
RDW: 12.8 % (ref 11.6–15.4)
WBC: 6.4 10*3/uL (ref 3.4–10.8)

## 2019-11-07 LAB — LIPID PANEL
Chol/HDL Ratio: 3.8 ratio (ref 0.0–5.0)
Cholesterol, Total: 142 mg/dL (ref 100–199)
HDL: 37 mg/dL — ABNORMAL LOW (ref >39)
LDL Chol Calc (NIH): 84 mg/dL (ref 0–99)
Triglycerides: 112 mg/dL (ref 0–149)
VLDL Cholesterol Cal: 21 mg/dL (ref 5–40)

## 2019-11-07 LAB — CARDIOVASCULAR RISK ASSESSMENT

## 2019-11-11 NOTE — Progress Notes (Signed)
Cardiology Office Note:    Date:  11/12/2019   ID:  John Kemp, DOB 12-01-1945, MRN [id number]  PCP:  [name], MD  Cardiologist:  Shirlee More, MD    Referring MD: [name], MD   please continue to do a CMP TSH T4 T3 every 6 months disease with amiodarone   ASSESSMENT:    1. CAD in native artery   2. Chronic diastolic heart failure ([place])   3. PAF (paroxysmal atrial fibrillation) ([place])   4. On amiodarone therapy   5. Anticoagulated   6. Hypertensive heart disease with chronic diastolic congestive heart failure ([place])   7. Pure hypercholesterolemia    PLAN:    In order of problems listed above:  1. Stable CAD arthritis is in class I presently having no angina continue his medical therapy including antihypertensives anticoagulant and high intensity statin. 2. His heart failure is compensated New [place] Heart Association class I he has no edema continue his current loop diuretic 3. Stable continue low-dose amiodarone anticoagulant and labs for screen for toxicity are done his PCP office in the epic system reviewed 4. Blood pressure is ideal continue his current multidrug regimen including beta-blocker centrally active clonidine diuretic and ARB.  Renal function potassium good 5. Lipids are ideal LDL less than 100 continue his high intensity statin   Next appointment: 6 months   Medication Adjustments/Labs and Tests Ordered: Current medicines are reviewed at length with the patient today.  Concerns regarding medicines are outlined above.  No orders of the defined types were placed in this encounter.  No orders of the defined types were placed in this encounter.   Chief Complaint  Patient presents with  . Follow-up  . Coronary Artery Disease  . Congestive Heart Failure  . Atrial Fibrillation  . Hypertension  . Hyperlipidemia    History of Present Illness:    John Kemp is a 74 y.o. male with a hx of CAD, diastolic CHF, Atrial Fibrillation, HTN, S/P  CABG and is on amiodoarone  and a stroke with visual loss    He was last seen 05/01/2019. Compliance with diet, lifestyle and medications: Yes  He is doing well he has no edema shortness of breath no palpitation or syncope and tolerates his amiodarone and his anticoagulant.  Recent labs from his PCP office 11/05/2019 shows ideal lipids LDL 90 cholesterol 142 HDL 37 A1c 5.2% and renal function creatinine 1.17 TSH 1.42.  He has had no angina and tolerates lipid-lowering therapy without muscle pain or weakness and no bleeding from his anticoagulant with atrial fibrillation Past Medical History:  Diagnosis Date  . A-fib ([place])   . Anticoagulated 01/21/2015  . Atherosclerotic heart disease of native coronary artery without angina pectoris   . CAD in native artery 01/21/2015  . Cancer ([place])    skin cancer  . Chest pain 04/02/2017  . CHF (congestive heart failure) ([place]) 11/05/2019  . Chronic diastolic (congestive) heart failure ([place])   . Chronic diastolic heart failure ([place]) 01/21/2015   Last Assessment & Plan:  Is congestive heart failure is mildly decompensated he is edematous he'll increase the dose of his diuretic today the lab work performed including an ARB referred to care transitions for home care he'll follow-up in my office with me in 4-6 weeks daily weights sodium restrictionand compliance of medications beinghemoglobin of will  . Chronic obstructive pulmonary disease (COPD) ([place])   . Dyspnea 12/31/2014   Followed in Pulmonary clinic/ [facility]/ [place]  -  amiodarone rx 11/2014 >>> - PFTs rec 12/31/2014    . Heart attack ([place])   . High cholesterol 10/20/2017  . Hx of CABG 03/02/2017  . Hyperlipidemia   . Hypertension   . Hypertensive heart disease 01/21/2015  . Hypertensive heart disease with heart failure ([place])   . Hypothyroidism   . Hypothyroidism (acquired) 04/24/2018  . Impaired fasting glucose   . Leg fatigue 03/03/2017  . Obesity 04/02/2017  . On amiodarone therapy 01/21/2015  . PAF  (paroxysmal atrial fibrillation) ([place])   . Primary insomnia   . Squamous cell carcinoma 11/05/2019  . 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 Stroke ([place])   . Typical atrial flutter ([place]) 01/21/2015  . Vitamin D deficiency, unspecified     Past Surgical History:  Procedure Laterality Date  . APPENDECTOMY  1956  . CARDIAC CATHETERIZATION    . CARDIOVERSION  2002  . CHOLECYSTECTOMY  2006  . CORONARY ARTERY BYPASS GRAFT  2001  . HERNIA REPAIR    . RADIOLOGY WITH ANESTHESIA Bilateral 01/18/2018   Procedure: MRI WITH ANESTHESIA LUMBAR SPINE WITH AND WITHOUT CONTRAST;  Surgeon: Radiologist, Medication, MD;  Location: [facility];  Service: Radiology;  Laterality: Bilateral;  . ROTATOR CUFF REPAIR Left   . TONSILLECTOMY  1975    Current Medications: Current Meds  Medication Sig  . amiodarone (PACERONE) 200 MG tablet Take 1 tablet (200 mg total) by mouth daily.  Marland Kitchen apixaban (ELIQUIS) 5 MG TABS tablet Take 5 mg by mouth 2 (two) times daily.  Marland Kitchen atorvastatin (LIPITOR) 10 MG tablet Take 10 mg by mouth at bedtime.  . carvedilol (COREG) 3.125 MG tablet Take 1 tablet (3.125 mg total) by mouth 2 (two) times daily with a meal.  . cloNIDine (CATAPRES) 0.1 MG tablet Take 0.1 mg by mouth 2 (two) times daily as needed (FOR BLOOD PRESSURE GREATER THAN 140/80).   . donepezil (ARICEPT) 10 MG tablet Take 1 tablet (10 mg total) by mouth at bedtime. Start 1/2 tablet daily x 4 weeks and then 1 tablet daily  . furosemide (LASIX) 40 MG tablet Take 1 tablet twice weekly on Wednesday and Saturday.  . Inulin (FIBER CHOICE PO) Take 3 tablets by mouth daily.  Marland Kitchen levothyroxine (SYNTHROID) 137 MCG tablet Take 1 tablet (137 mcg total) by mouth daily before breakfast.  . meloxicam (MOBIC) 7.5 MG tablet Take 7.5 mg by mouth daily. Prescribed by PCP Dr. Benjamine Mola  . potassium chloride (K-DUR,KLOR-CON) 10 MEQ tablet Take 10 mEq by mouth daily as needed (for fluid retention.).   Marland Kitchen valsartan (DIOVAN)  320 MG tablet Take 320 mg by mouth daily.  . Vitamin D, Ergocalciferol, (DRISDOL) 1.25 MG (50000 UNIT) CAPS capsule Take 1 capsule (50,000 Units total) by mouth every Saturday. MORNING.  Marland Kitchen zolpidem (AMBIEN) 10 MG tablet Take 10 mg by mouth at bedtime.     Allergies:   Avelox [moxifloxacin hcl], Cartia xt [diltiazem hcl], Pravachol [pravastatin sodium], Prednisone, and Zetia [ezetimibe]   Social History   Socioeconomic History  . Marital status: Married    Spouse name: Not on file  . Number of children: Not on file  . Years of education: Not on file  . Highest education level: Not on file  Occupational History  . Occupation: Retired  Tobacco Use  . Smoking status: Former Smoker    Packs/day: 1.00    Years: 15.00    Pack years: 15.00    Types: Cigarettes    Quit date: 08/29/1978  Years since quitting: 41.2  . Smokeless tobacco: Never Used  Substance and Sexual Activity  . Alcohol use: No    Alcohol/week: 0.0 standard drinks  . Drug use: No  . Sexual activity: Not on file  Other Topics Concern  . Not on file  Social History Narrative  . Not on file   Social Determinants of Health   Financial Resource Strain:   . Difficulty of Paying Living Expenses:   Food Insecurity:   . Worried About Charity fundraiser in the Last Year:   . Arboriculturist in the Last Year:   Transportation Needs:   . Film/video editor (Medical):   Marland Kitchen Lack of Transportation (Non-Medical):   Physical Activity:   . Days of Exercise per Week:   . Minutes of Exercise per Session:   Stress:   . Feeling of Stress :   Social Connections:   . Frequency of Communication with Friends and Family:   . Frequency of Social Gatherings with Friends and Family:   . Attends Religious Services:   . Active Member of Clubs or Organizations:   . Attends Archivist Meetings:   Marland Kitchen Marital Status:      Family History: The patient's family history includes Arrhythmia in his father; Breast cancer in his  mother; Diabetes type II in his sister; Heart disease in his father. ROS:   Please see the history of present illness.    All other systems reviewed and are negative.  EKGs/Labs/Other Studies Reviewed:    The following studies were reviewed today:  EKG: I did not feel the need to repeat an EKG today  Recent Labs: 11/05/2019: ALT 20; BUN 16; Creatinine, Ser 1.17; Hemoglobin 15.6; Platelets 212; Potassium 4.5; Sodium 141  Recent Lipid Panel    Component Value Date/Time   CHOL 142 11/05/2019 1058   TRIG 112 11/05/2019 1058   HDL 37 (L) 11/05/2019 1058   CHOLHDL 3.8 11/05/2019 1058   CHOLHDL 5.9 04/02/2017 0213   VLDL 35 04/02/2017 0213   LDLCALC 84 11/05/2019 1058    Physical Exam:    VS:  BP 130/60   Pulse 61   Temp 98 F (36.7 C)   Ht 5\' 8"  (1.727 m)   Wt 254 lb 3.2 oz (115.3 kg)   SpO2 97%   BMI 38.65 kg/m     Wt Readings from Last 3 Encounters:  11/12/19 254 lb 3.2 oz (115.3 kg)  11/05/19 250 lb (113.4 kg)  05/01/19 247 lb (112 kg)     GEN:  Well nourished, well developed in no acute distress HEENT: Normal NECK: No JVD; No carotid bruits LYMPHATICS: No lymphadenopathy CARDIAC: RRR, no murmurs, rubs, gallops RESPIRATORY:  Clear to auscultation without rales, wheezing or rhonchi  ABDOMEN: Soft, non-tender, non-distended MUSCULOSKELETAL:  No edema; No deformity  SKIN: Warm and dry NEUROLOGIC:  Alert and oriented x 3 PSYCHIATRIC:  Normal affect    Signed, Shirlee More, MD  11/12/2019 2:32 PM    [facility]

## 2019-11-12 ENCOUNTER — Other Ambulatory Visit: Payer: Self-pay

## 2019-11-12 ENCOUNTER — Ambulatory Visit (INDEPENDENT_AMBULATORY_CARE_PROVIDER_SITE_OTHER): Payer: Medicare Other | Admitting: Cardiology

## 2019-11-12 ENCOUNTER — Encounter: Payer: Self-pay | Admitting: Cardiology

## 2019-11-12 VITALS — BP 130/60 | HR 61 | Temp 98.0°F | Ht 68.0 in | Wt 254.2 lb

## 2019-11-12 DIAGNOSIS — I2510 Atherosclerotic heart disease of native coronary artery without angina pectoris: Secondary | ICD-10-CM

## 2019-11-12 DIAGNOSIS — E7800 Pure hypercholesterolemia, unspecified: Secondary | ICD-10-CM | POA: Diagnosis not present

## 2019-11-12 DIAGNOSIS — I480 Paroxysmal atrial fibrillation: Secondary | ICD-10-CM

## 2019-11-12 DIAGNOSIS — Z7901 Long term (current) use of anticoagulants: Secondary | ICD-10-CM

## 2019-11-12 DIAGNOSIS — I110 Hypertensive heart disease with heart failure: Secondary | ICD-10-CM

## 2019-11-12 DIAGNOSIS — I5032 Chronic diastolic (congestive) heart failure: Secondary | ICD-10-CM

## 2019-11-12 DIAGNOSIS — Z79899 Other long term (current) drug therapy: Secondary | ICD-10-CM

## 2019-11-12 NOTE — Patient Instructions (Signed)
Medication Instructions:   Your physician recommends that you continue on your current medications as directed. Please refer to the Current Medication list given to you today.   *If you need a refill on your cardiac medications before your next appointment, please call your pharmacy*   Lab Work: [place]  If you have labs (blood work) drawn today and your tests are completely normal, you will receive your results only by: Marland Kitchen MyChart Message (if you have MyChart) OR . A paper copy in the mail If you have any lab test that is abnormal or we need to change your treatment, we will call you to review the results.   Testing/Procedures: NONE ORDERED  TODAY    Follow-Up: At [facility], you and your health needs are our priority.  As part of our continuing mission to provide you with exceptional heart care, we have created designated Provider Care Teams.  These Care Teams include your primary Cardiologist (physician) and Advanced Practice Providers (APPs -  Physician Assistants and Nurse Practitioners) who all work together to provide you with the care you need, when you need it.  We recommend signing up for the patient portal called "MyChart".  Sign up information is provided on this After Visit Summary.  MyChart is used to connect with patients for Virtual Visits (Telemedicine).  Patients are able to view lab/test results, encounter notes, upcoming appointments, etc.  Non-urgent messages can be sent to your provider as well.   To learn more about what you can do with MyChart, go to [email or web address].    Your next appointment:   6 month(s)  The format for your next appointment:   In Person  Provider:   You will see Dr. Bettina Gavia .  Or, you can be scheduled with the following Advanced Practice Provider on your designated Care Team (at our [facility]):  Laurann Montana, FNP    Other Instructions

## 2019-11-14 DIAGNOSIS — L570 Actinic keratosis: Secondary | ICD-10-CM | POA: Diagnosis not present

## 2019-11-14 DIAGNOSIS — L578 Other skin changes due to chronic exposure to nonionizing radiation: Secondary | ICD-10-CM | POA: Diagnosis not present

## 2019-11-14 DIAGNOSIS — L820 Inflamed seborrheic keratosis: Secondary | ICD-10-CM | POA: Diagnosis not present

## 2019-11-14 DIAGNOSIS — L821 Other seborrheic keratosis: Secondary | ICD-10-CM | POA: Diagnosis not present

## 2019-11-17 ENCOUNTER — Encounter: Payer: Self-pay | Admitting: Family Medicine

## 2019-11-17 DIAGNOSIS — I6930 Unspecified sequelae of cerebral infarction: Secondary | ICD-10-CM

## 2019-11-17 DIAGNOSIS — H539 Unspecified visual disturbance: Secondary | ICD-10-CM

## 2019-11-17 DIAGNOSIS — E782 Mixed hyperlipidemia: Secondary | ICD-10-CM

## 2019-11-17 DIAGNOSIS — D688 Other specified coagulation defects: Secondary | ICD-10-CM | POA: Insufficient documentation

## 2019-11-17 DIAGNOSIS — I10 Essential (primary) hypertension: Secondary | ICD-10-CM | POA: Insufficient documentation

## 2019-11-17 DIAGNOSIS — I482 Chronic atrial fibrillation, unspecified: Secondary | ICD-10-CM | POA: Insufficient documentation

## 2019-11-17 DIAGNOSIS — D6869 Other thrombophilia: Secondary | ICD-10-CM | POA: Insufficient documentation

## 2019-11-17 DIAGNOSIS — I679 Cerebrovascular disease, unspecified: Secondary | ICD-10-CM

## 2019-11-17 HISTORY — DX: Cerebrovascular disease, unspecified: I67.9

## 2019-11-17 HISTORY — DX: Unspecified sequelae of cerebral infarction: I69.30

## 2019-11-17 HISTORY — DX: Unspecified visual disturbance: H53.9

## 2019-11-17 HISTORY — DX: Other thrombophilia: D68.69

## 2019-11-17 HISTORY — DX: Mixed hyperlipidemia: E78.2

## 2019-11-19 ENCOUNTER — Other Ambulatory Visit: Payer: Self-pay

## 2019-11-19 MED ORDER — ZOLPIDEM TARTRATE 10 MG PO TABS: 10.0000 mg | ORAL_TABLET | Freq: Every day | ORAL | 5 refills | Status: DC

## 2019-12-02 ENCOUNTER — Ambulatory Visit (INDEPENDENT_AMBULATORY_CARE_PROVIDER_SITE_OTHER): Payer: Medicare Other | Admitting: Nurse Practitioner

## 2019-12-02 ENCOUNTER — Other Ambulatory Visit: Payer: Self-pay

## 2019-12-02 ENCOUNTER — Encounter: Payer: Self-pay | Admitting: Nurse Practitioner

## 2019-12-02 ENCOUNTER — Other Ambulatory Visit: Payer: Self-pay | Admitting: Nurse Practitioner

## 2019-12-02 VITALS — BP 142/62 | HR 52 | Temp 97.7°F | Ht 68.0 in | Wt 250.0 lb

## 2019-12-02 DIAGNOSIS — H01005 Unspecified blepharitis left lower eyelid: Secondary | ICD-10-CM | POA: Diagnosis not present

## 2019-12-02 DIAGNOSIS — C801 Malignant (primary) neoplasm, unspecified: Secondary | ICD-10-CM | POA: Insufficient documentation

## 2019-12-02 HISTORY — DX: Unspecified blepharitis left lower eyelid: H01.005

## 2019-12-02 MED ORDER — ERYTHROMYCIN 5 MG/GM OP OINT: 1.0000 "application " | TOPICAL_OINTMENT | Freq: Every day | OPHTHALMIC | 0 refills | Status: DC

## 2019-12-02 NOTE — Assessment & Plan Note (Addendum)
Left eye is irritated and not well controlled with home remedies. Provided education to patient, apply warm compress several times a day. Antiinflammatory, wash hands frequently. erythromycin ointment to affected eye once a day at bed time for 4 days. Patient knows to follow up with unresolved or worsening symptoms.

## 2019-12-02 NOTE — Patient Instructions (Addendum)
Blepharitis of left lower eyelid Left eye is irritated and not well controlled with home remedies. Provided education to patient, apply cold compress, antiinflammatory, erythromycin eye ointment to affected eye once a day at bed time for 4 days. Patient knows to follow up with unresolved or worsening symptoms.    Blepharitis Blepharitis is swelling of the eyelids. Symptoms may include:  Reddish, scaly skin around the scalp and eyebrows.  Burning or itching of the eyelids.  Fluid coming from the eye at night. This causes the eyelashes to stick together in the morning.  Eyelashes that fall out.  Being sensitive to light. Follow these instructions at home: Pay attention to any changes in how you look or feel. Tell your health care provider about any changes. Follow these instructions to help with your condition: Keeping clean   Wash your hands often.  Wash your eyelids with warm water, or wash them with warm water that is mixed with little bit of baby shampoo. Do this 2 or more times per day.  Wash your face and eyebrows at least once a day.  Use a clean towel each time you dry your eyelids. Do not use the towel to clean or dry other areas of your body. Do not share your towel with anyone. General instructions  Avoid wearing makeup until you get better. Do not share makeup with anyone.  Avoid rubbing your eyes.  Put a warm compress on your eyes 2 times per day for 10 minutes at a time, or as told by your doctor.  If you were given antibiotics in the form of creams or eye drops, use the medicine as told by your doctor. Do not stop using the medicine even if you feel better.  Keep all follow-up visits as told by your doctor. This is important. Contact a doctor if:  Your eyelids feel hot.  You have blisters on your eyelids.  You have a rash on your eyelids.  The swelling does not go away in 2-4 days.  The swelling gets worse. Get help right away if:  You have pain that  gets worse.  You have pain that spreads to other parts of your face.  You have redness that gets worse.  You have redness that spreads to other parts of your face.  Your vision changes.  You have pain when you look at lights or things that move.  You have a fever. Summary  Blepharitis is swelling of the eyelids.  Pay attention to any changes in how your eyes look or feel. Tell your doctor about any changes.  Follow home care instructions as told by your doctor. Wash your hands often. Avoid wearing makeup. Do not rub your eyes.  Use warm compresses, creams, or eye drops as told by your doctor.  Let your doctor know if you have changes in vision, blisters or rash on eyelids, pain that spreads to your face, or warmth on your eyelids. This information is not intended to replace advice given to you by your health care provider. Make sure you discuss any questions you have with your health care provider. Document Revised: 02/12/2018 Document Reviewed: 02/12/2018 Elsevier Patient Education  [place].

## 2019-12-02 NOTE — Progress Notes (Signed)
Established Patient Office Visit  Subjective:  Patient ID: John Kemp, male    DOB: [birth year]  Age: 74 y.o. MRN: [id number]  CC: Patient  is a  74 year old male. He is here for blepharitis. The patient's medications were reviewed and reconciled since the patient's last visit.  History details were provided by the patient. The history appears to be reliable.     Chief Complaint  Patient presents with  . Scabbing up under left eye    started last thursday.    HPI John Kemp presents for blepharitis in the left eye. Michel stated eye pain and itching started about 4 days ago. Pt reports discomfort and swelling around lower eye lid.  Nothing aggravates or alleviates symptom. Pt denies any foreign object in the eye.   Past Medical History:  Diagnosis Date  . A-fib ([place])   . Anticoagulated 01/21/2015  . Atherosclerotic heart disease of native coronary artery without angina pectoris   . CAD in native artery 01/21/2015  . Cancer ([place])    skin cancer  . Chest pain 04/02/2017  . CHF (congestive heart failure) ([place]) 11/05/2019  . Chronic diastolic (congestive) heart failure ([place])   . Chronic diastolic heart failure ([place]) 01/21/2015   Last Assessment & Plan:  Is congestive heart failure is mildly decompensated he is edematous he'll increase the dose of his diuretic today the lab work performed including an ARB referred to care transitions for home care he'll follow-up in my office with me in 4-6 weeks daily weights sodium restrictionand compliance of medications beinghemoglobin of will  . Chronic obstructive pulmonary disease (COPD) ([place])   . Dyspnea 12/31/2014   Followed in Pulmonary clinic/ [facility]/ [place]  - amiodarone rx 11/2014 >>> - PFTs rec 12/31/2014    . Heart attack ([place])   . High cholesterol 10/20/2017  . Hx of CABG 03/02/2017  . Hyperlipidemia   . Hypertension   . Hypertensive heart disease 01/21/2015  . Hypertensive heart disease with heart failure ([place])   .  Hypothyroidism   . Hypothyroidism (acquired) 04/24/2018  . Impaired fasting glucose   . Leg fatigue 03/03/2017  . Obesity 04/02/2017  . On amiodarone therapy 01/21/2015  . PAF (paroxysmal atrial fibrillation) ([place])   . Primary insomnia   . Squamous cell carcinoma 11/05/2019  . 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 Stroke ([place])   . Typical atrial flutter ([place]) 01/21/2015  . Vitamin D deficiency, unspecified     Past Surgical History:  Procedure Laterality Date  . APPENDECTOMY  1956  . CARDIAC CATHETERIZATION    . CARDIOVERSION  2002  . CHOLECYSTECTOMY  2006  . CORONARY ARTERY BYPASS GRAFT  2001  . HERNIA REPAIR    . RADIOLOGY WITH ANESTHESIA Bilateral 01/18/2018   Procedure: MRI WITH ANESTHESIA LUMBAR SPINE WITH AND WITHOUT CONTRAST;  Surgeon: Radiologist, Medication, MD;  Location: [facility];  Service: Radiology;  Laterality: Bilateral;  . ROTATOR CUFF REPAIR Left   . TONSILLECTOMY  1975    Family History  Problem Relation Age of Onset  . Heart disease Father   . Arrhythmia Father   . Breast cancer Mother   . Diabetes type II Sister     Social History   Socioeconomic History  . Marital status: Married    Spouse name: Not on file  . Number of children: Not on file  . Years of education: Not on file  . Highest education level: Not on file  Occupational History  . Occupation: Retired  Tobacco Use  . Smoking status: Former Smoker    Packs/day: 1.00    Years: 15.00    Pack years: 15.00    Types: Cigarettes    Quit date: 08/29/1978    Years since quitting: 41.2  . Smokeless tobacco: Never Used  Substance and Sexual Activity  . Alcohol use: No    Alcohol/week: 0.0 standard drinks  . Drug use: No  . Sexual activity: Not on file  Other Topics Concern  . Not on file  Social History Narrative  . Not on file   Social Determinants of Health   Financial Resource Strain:   . Difficulty of Paying Living Expenses:   Food Insecurity:   . Worried  About Charity fundraiser in the Last Year:   . Arboriculturist in the Last Year:   Transportation Needs:   . Film/video editor (Medical):   Marland Kitchen Lack of Transportation (Non-Medical):   Physical Activity:   . Days of Exercise per Week:   . Minutes of Exercise per Session:   Stress:   . Feeling of Stress :   Social Connections:   . Frequency of Communication with Friends and Family:   . Frequency of Social Gatherings with Friends and Family:   . Attends Religious Services:   . Active Member of Clubs or Organizations:   . Attends Archivist Meetings:   Marland Kitchen Marital Status:   Intimate Partner Violence:   . Fear of Current or Ex-Partner:   . Emotionally Abused:   Marland Kitchen Physically Abused:   . Sexually Abused:     Outpatient Medications Prior to Visit  Medication Sig Dispense Refill  . amiodarone (PACERONE) 200 MG tablet Take 1 tablet (200 mg total) by mouth daily. 90 tablet 1  . apixaban (ELIQUIS) 5 MG TABS tablet Take 5 mg by mouth 2 (two) times daily.    Marland Kitchen atorvastatin (LIPITOR) 10 MG tablet Take 10 mg by mouth at bedtime.    . carvedilol (COREG) 3.125 MG tablet Take 1 tablet (3.125 mg total) by mouth 2 (two) times daily with a meal. 180 tablet 1  . cloNIDine (CATAPRES) 0.1 MG tablet Take 0.1 mg by mouth 2 (two) times daily as needed (FOR BLOOD PRESSURE GREATER THAN 140/80).     . donepezil (ARICEPT) 10 MG tablet Take 1 tablet (10 mg total) by mouth at bedtime. Start 1/2 tablet daily x 4 weeks and then 1 tablet daily 90 tablet 1  . furosemide (LASIX) 40 MG tablet Take 1 tablet twice weekly on Wednesday and Saturday. 24 tablet 1  . Inulin (FIBER CHOICE PO) Take 3 tablets by mouth daily.    Marland Kitchen levothyroxine (SYNTHROID) 137 MCG tablet Take 1 tablet (137 mcg total) by mouth daily before breakfast. 90 tablet 0  . meloxicam (MOBIC) 7.5 MG tablet Take 7.5 mg by mouth daily. Prescribed by PCP Dr. Benjamine Mola    . potassium chloride (K-DUR,KLOR-CON) 10 MEQ tablet Take 10 mEq by mouth  daily as needed (for fluid retention.).     Marland Kitchen valsartan (DIOVAN) 320 MG tablet Take 320 mg by mouth daily.    . Vitamin D, Ergocalciferol, (DRISDOL) 1.25 MG (50000 UNIT) CAPS capsule Take 1 capsule (50,000 Units total) by mouth every Saturday. MORNING. 12 capsule 0  . zolpidem (AMBIEN) 10 MG tablet Take 1 tablet (10 mg total) by mouth at bedtime. 30 tablet 5  . nitroGLYCERIN (NITROSTAT) 0.4 MG SL tablet Place 1  tablet (0.4 mg total) under the tongue every 5 (five) minutes as needed for chest pain. 25 tablet 5   No facility-administered medications prior to visit.    Allergies  Allergen Reactions  . Avelox [Moxifloxacin Hcl]   . Cartia Xt [Diltiazem Hcl] Nausea And Vomiting  . Pravachol [Pravastatin Sodium] Other (See Comments)    myalgia  . Prednisone Other (See Comments)    "makes me feel terrible"  . Zetia [Ezetimibe] Other (See Comments)    myalgias    ROS Review of Systems  Constitutional: Negative for activity change, appetite change and chills.  HENT: Negative for congestion.   Eyes: Positive for pain, discharge and redness. Negative for photophobia and visual disturbance.  Respiratory: Negative for chest tightness and shortness of breath.   Cardiovascular: Negative for chest pain and palpitations.  Gastrointestinal: Negative for abdominal pain.  Genitourinary: Negative for difficulty urinating.  Musculoskeletal: Negative for arthralgias and myalgias.  Skin: Negative for rash.      Objective:    Physical Exam  Constitutional: He is oriented to person, place, and time. He appears well-developed and well-nourished.  HENT:  Head: Normocephalic.  Mouth/Throat: Oropharynx is clear and moist.  Eyes: Right eye exhibits no discharge. Left eye exhibits discharge.  Lower left eye lid edematous   Cardiovascular: Normal rate.  Pulmonary/Chest: Breath sounds normal.  Abdominal: Bowel sounds are normal.  Musculoskeletal:     Cervical back: Neck supple.  Neurological: He is  alert and oriented to person, place, and time.  Skin: Skin is warm.  Psychiatric: He has a normal mood and affect.    BP (!) 142/62 (BP Location: Left Arm, Patient Position: Sitting)   Pulse (!) 52   Temp 97.7 F (36.5 C) (Temporal)   Ht 5\' 8"  (1.727 m)   Wt 250 lb (113.4 kg)   SpO2 98%   BMI 38.01 kg/m  Wt Readings from Last 3 Encounters:  12/02/19 250 lb (113.4 kg)  11/12/19 254 lb 3.2 oz (115.3 kg)  11/05/19 250 lb (113.4 kg)     Health Maintenance Due  Topic Date Due  . Hepatitis C Screening  Never done    There are no preventive care reminders to display for this patient.  Lab Results  Component Value Date   TSH 0.843 10/23/2017   Lab Results  Component Value Date   WBC 6.4 11/05/2019   HGB 15.6 11/05/2019   HCT 47.2 11/05/2019   MCV 94 11/05/2019   PLT 212 11/05/2019   Lab Results  Component Value Date   NA 141 11/05/2019   K 4.5 11/05/2019   CO2 26 11/05/2019   GLUCOSE 95 11/05/2019   BUN 16 11/05/2019   CREATININE 1.17 11/05/2019   BILITOT 1.0 11/05/2019   ALKPHOS 111 11/05/2019   AST 21 11/05/2019   ALT 20 11/05/2019   PROT 6.7 11/05/2019   ALBUMIN 4.4 11/05/2019   CALCIUM 9.4 11/05/2019   ANIONGAP 8 01/18/2018   Lab Results  Component Value Date   CHOL 142 11/05/2019   Lab Results  Component Value Date   HDL 37 (L) 11/05/2019   Lab Results  Component Value Date   LDLCALC 84 11/05/2019   Lab Results  Component Value Date   TRIG 112 11/05/2019   Lab Results  Component Value Date   CHOLHDL 3.8 11/05/2019   No results found for: HGBA1C    Assessment & Plan:   Problem List Items Addressed This Visit      Other  Blepharitis of left lower eyelid - Primary    Left eye is irritated and not well controlled with home remedies. Provided education to patient, apply warm compress several times a day. Antiinflammatory, wash hands frequently. erythromycin ointment to affected eye once a day at bed time for 4 days. Patient knows to  follow up with unresolved or worsening symptoms.      Relevant Medications   erythromycin ophthalmic ointment      Meds ordered this encounter  Medications  . erythromycin ophthalmic ointment    Sig: Place 1 application into the left eye at bedtime.    Dispense:  3.5 g    Refill:  0    Order Specific Question:   Supervising Provider    Answer:   Shelton Silvas    Follow-up: Return if symptoms worsen or fail to improve.    Ivy Lynn, NP

## 2019-12-13 ENCOUNTER — Telehealth: Payer: Self-pay | Admitting: Family Medicine

## 2019-12-13 NOTE — Progress Notes (Signed)
  Chronic Care Management   Note  12/13/2019 Name: John Kemp MRN: [id number] DOB: December 16, 1945  John Kemp is a 74 y.o. year old male who is a primary care patient of Cox, Kirsten, MD. I reached out to John Kemp by phone today in response to a referral sent by John Kemp PCP, [name], MD.   John Kemp was given information about Chronic Care Management services today including:  1. CCM service includes personalized support from designated clinical staff supervised by his physician, including individualized plan of care and coordination with other care providers 2. 24/7 contact phone numbers for assistance for urgent and routine care needs. 3. Service will only be billed when office clinical staff spend 20 minutes or more in a month to coordinate care. 4. Only one practitioner may furnish and bill the service in a calendar month. 5. The patient may stop CCM services at any time (effective at the end of the month) by phone call to the office staff.   Patient did not agree to services and wishes to consider information provided before deciding about enrollment in care management services.   Follow up plan:   Earney [place] Upstream Scheduler

## 2019-12-27 ENCOUNTER — Other Ambulatory Visit: Payer: Self-pay

## 2019-12-27 MED ORDER — VALSARTAN 320 MG PO TABS: 320.0000 mg | ORAL_TABLET | Freq: Every day | ORAL | 0 refills | Status: DC

## 2020-01-16 ENCOUNTER — Other Ambulatory Visit: Payer: Self-pay | Admitting: Family Medicine

## 2020-01-16 DIAGNOSIS — J019 Acute sinusitis, unspecified: Secondary | ICD-10-CM | POA: Diagnosis not present

## 2020-01-18 DIAGNOSIS — I110 Hypertensive heart disease with heart failure: Secondary | ICD-10-CM | POA: Diagnosis not present

## 2020-01-18 DIAGNOSIS — J440 Chronic obstructive pulmonary disease with acute lower respiratory infection: Secondary | ICD-10-CM | POA: Diagnosis not present

## 2020-01-18 DIAGNOSIS — I4891 Unspecified atrial fibrillation: Secondary | ICD-10-CM | POA: Diagnosis not present

## 2020-01-18 DIAGNOSIS — I509 Heart failure, unspecified: Secondary | ICD-10-CM | POA: Diagnosis not present

## 2020-01-18 DIAGNOSIS — Z87891 Personal history of nicotine dependence: Secondary | ICD-10-CM | POA: Diagnosis not present

## 2020-01-18 DIAGNOSIS — J209 Acute bronchitis, unspecified: Secondary | ICD-10-CM | POA: Diagnosis not present

## 2020-01-18 DIAGNOSIS — I252 Old myocardial infarction: Secondary | ICD-10-CM | POA: Diagnosis not present

## 2020-01-18 DIAGNOSIS — R0602 Shortness of breath: Secondary | ICD-10-CM | POA: Diagnosis not present

## 2020-01-18 DIAGNOSIS — Z8673 Personal history of transient ischemic attack (TIA), and cerebral infarction without residual deficits: Secondary | ICD-10-CM | POA: Diagnosis not present

## 2020-01-18 DIAGNOSIS — I2510 Atherosclerotic heart disease of native coronary artery without angina pectoris: Secondary | ICD-10-CM | POA: Diagnosis not present

## 2020-01-19 DIAGNOSIS — R0602 Shortness of breath: Secondary | ICD-10-CM | POA: Diagnosis not present

## 2020-01-20 ENCOUNTER — Telehealth: Payer: Self-pay

## 2020-01-20 NOTE — Telephone Encounter (Signed)
John Kemp called to report that he was seen in the ED this weekend with his COPD.  He was given an antibiotic and Zopenex for his nebulizer.  He also was given a atrovent inhaler.  The cost of the zopenex was over $300 and he did not pick-up the RX but he called back to the hospital and they have switched him to Albuterol.  Dr. Tobie Poet advised that he use this medication cautiously with his atrial fib.  He was instructed to call us back if he develops palpitations.

## 2020-02-06 ENCOUNTER — Encounter: Payer: Self-pay | Admitting: Family Medicine

## 2020-02-06 ENCOUNTER — Ambulatory Visit (INDEPENDENT_AMBULATORY_CARE_PROVIDER_SITE_OTHER): Payer: Medicare Other | Admitting: Family Medicine

## 2020-02-06 ENCOUNTER — Other Ambulatory Visit: Payer: Self-pay

## 2020-02-06 VITALS — BP 140/80 | HR 70 | Temp 97.7°F | Resp 17 | Ht 70.5 in | Wt 249.0 lb

## 2020-02-06 DIAGNOSIS — Z79899 Other long term (current) drug therapy: Secondary | ICD-10-CM | POA: Diagnosis not present

## 2020-02-06 DIAGNOSIS — D6869 Other thrombophilia: Secondary | ICD-10-CM

## 2020-02-06 DIAGNOSIS — I110 Hypertensive heart disease with heart failure: Secondary | ICD-10-CM | POA: Diagnosis not present

## 2020-02-06 DIAGNOSIS — E039 Hypothyroidism, unspecified: Secondary | ICD-10-CM | POA: Diagnosis not present

## 2020-02-06 DIAGNOSIS — I69398 Other sequelae of cerebral infarction: Secondary | ICD-10-CM | POA: Diagnosis not present

## 2020-02-06 DIAGNOSIS — I5032 Chronic diastolic (congestive) heart failure: Secondary | ICD-10-CM | POA: Diagnosis not present

## 2020-02-06 DIAGNOSIS — Z125 Encounter for screening for malignant neoplasm of prostate: Secondary | ICD-10-CM | POA: Diagnosis not present

## 2020-02-06 DIAGNOSIS — H539 Unspecified visual disturbance: Secondary | ICD-10-CM

## 2020-02-06 DIAGNOSIS — I10 Essential (primary) hypertension: Secondary | ICD-10-CM | POA: Diagnosis not present

## 2020-02-06 DIAGNOSIS — E782 Mixed hyperlipidemia: Secondary | ICD-10-CM

## 2020-02-06 DIAGNOSIS — I482 Chronic atrial fibrillation, unspecified: Secondary | ICD-10-CM

## 2020-02-06 MED ORDER — MELOXICAM 7.5 MG PO TABS: 7.5000 mg | ORAL_TABLET | Freq: Every day | ORAL | 1 refills | Status: DC

## 2020-02-06 NOTE — Progress Notes (Signed)
Acute Office Visit  Subjective:    Patient ID: John Kemp, male    DOB: 1946-07-07, 74 y.o.   MRN: [id number]  Chief Complaint  Patient presents with  . Hyperlipidemia  . Hypertension    HPI  Pt presents for follow up of hypertension.  Date of diagnosis 03/2013.  His current cardiac medication regimen includes a diuretic ( Lasix 40 mg BID ), a beta-blocker ( Coreg 3.125 mg BID ), a calcium channel blocker ( Norvasc 5 mg QD ), an angiotensin receptor blocker ( Diovan ), Amiodarone 200 mg QD. He is tolerating the medication well without side effects.  Compliance with treatment has been good; he takes his medication as directed, maintains his diet and exercise regimen, and follows up as directed.  Concurrent health problems include hyperlipidemia, coronary artery disease, and congestive heart failure.      Paroxysmal atrial fibrillation details; he is here for routine follow-up for atrial fibrillation. Current related medications include amiodarone,  a beta blocker for rate control, and Eliquis.   He is extremely compliant with his medication regimen.   He denies any awareness of a disturbance in heart rate or rhythm. There has been a prior attempt at electrical cardioversion. This resulted in a brief conversion to normal sinus rhythm with a return to atrial fibrillation. He has been hospitalized for complications related to the atrial fibrillation 2 times. His medical history is pertinent for hypertension, coronary artery disease ( s/p CABG ), congestive heart failure and COPD.      Dx with mixed hyperlipidemia; date of diagnosis 05/09/2013.  Current treatment includes Lipitor.  Compliance with treatment has been good; he takes his medication as directed, maintains his low cholesterol diet, and follows up as directed.  He denies experiencing any hypercholesterolemia related symptoms.        In regard to the chronic diastolic (congestive) heart failure, he is here today for routine follow-up.   Specifically this is diastolic heart failure.  His CHF was first diagnosed in 2002.  The course of the disease has been stable.  Currently, his treatment regimen consists of a diuretic ( furosemide ), Coreg, and Amiodarone.  He tries to take medications faithfully and weigh himself daily.  He denies any adverse medication effects.  Medical history is pertinent for co-existant atrial fibrillation, COPD,  hypertension, hyperlipidemia, and hypothyroidism.    Concerning hypothryroidism, date of diagnosis 12/22/2014.  He is currently taking Synthroid, 137 mcg daily.  TSH was last checked 6 months ago.  The result was reported as normal ( 1.420 mU/L ).  He denies any related symptoms.  He reports no symptoms suggestive of adverse medication effect.    Past Medical History:  Diagnosis Date  . A-fib ([place])   . Anticoagulated 01/21/2015  . Atherosclerotic heart disease of native coronary artery without angina pectoris   . Blepharitis of left lower eyelid 12/02/2019  . CAD in native artery 01/21/2015  . Cancer ([place])    skin cancer  . Chest pain 04/02/2017  . CHF (congestive heart failure) ([place]) 11/05/2019  . Chronic diastolic (congestive) heart failure ([place])   . Chronic diastolic heart failure ([place]) 01/21/2015   Last Assessment & Plan:  Is congestive heart failure is mildly decompensated he is edematous he'll increase the dose of his diuretic today the lab work performed including an ARB referred to care transitions for home care he'll follow-up in my office with me in 4-6 weeks daily weights sodium restrictionand compliance of medications beinghemoglobin of  will  . Chronic obstructive pulmonary disease (COPD) ([place])   . Dyspnea 12/31/2014   Followed in Pulmonary clinic/ [facility]/ [place]  - amiodarone rx 11/2014 >>> - PFTs rec 12/31/2014    . Heart attack ([place])   . High cholesterol 10/20/2017  . Hx of CABG 03/02/2017  . Hyperlipidemia   . Hypertension   . Hypertensive heart disease 01/21/2015  . Hypertensive  heart disease with heart failure ([place])   . Hypothyroidism   . Hypothyroidism (acquired) 04/24/2018  . Impaired fasting glucose   . Leg fatigue 03/03/2017  . Obesity 04/02/2017  . On amiodarone therapy 01/21/2015  . PAF (paroxysmal atrial fibrillation) ([place])   . Primary insomnia   . Squamous cell carcinoma 11/05/2019  . Stroke (cerebrum) ([place]) 09/2016   right superior quadrantanopsia  . Stroke ([place])   . Typical atrial flutter ([place]) 01/21/2015  . Vitamin D deficiency, unspecified     Past Surgical History:  Procedure Laterality Date  . APPENDECTOMY  1956  . CARDIAC CATHETERIZATION    . CARDIOVERSION  2002  . CHOLECYSTECTOMY  2006  . CORONARY ARTERY BYPASS GRAFT  2001  . HERNIA REPAIR    . RADIOLOGY WITH ANESTHESIA Bilateral 01/18/2018   Procedure: MRI WITH ANESTHESIA LUMBAR SPINE WITH AND WITHOUT CONTRAST;  Surgeon: Radiologist, Medication, MD;  Location: [facility];  Service: Radiology;  Laterality: Bilateral;  . ROTATOR CUFF REPAIR Left   . TONSILLECTOMY  1975    Family History  Problem Relation Age of Onset  . Heart disease Father   . Arrhythmia Father   . Breast cancer Mother   . Diabetes type II Sister     Social History   Socioeconomic History  . Marital status: Married    Spouse name: Not on file  . Number of children: Not on file  . Years of education: Not on file  . Highest education level: Not on file  Occupational History  . Occupation: Retired  Tobacco Use  . Smoking status: Former Smoker    Packs/day: 1.00    Years: 15.00    Pack years: 15.00    Types: Cigarettes    Quit date: 08/29/1978    Years since quitting: 41.4  . Smokeless tobacco: Never Used  Vaping Use  . Vaping Use: Never used  Substance and Sexual Activity  . Alcohol use: No    Alcohol/week: 0.0 standard drinks  . Drug use: No  . Sexual activity: Not Currently  Other Topics Concern  . Not on file  Social History Narrative  . Not on file   Social Determinants of Health   Financial Resource  Strain:   . Difficulty of Paying Living Expenses:   Food Insecurity:   . Worried About Charity fundraiser in the Last Year:   . Arboriculturist in the Last Year:   Transportation Needs:   . Film/video editor (Medical):   Marland Kitchen Lack of Transportation (Non-Medical):   Physical Activity:   . Days of Exercise per Week:   . Minutes of Exercise per Session:   Stress:   . Feeling of Stress :   Social Connections:   . Frequency of Communication with Friends and Family:   . Frequency of Social Gatherings with Friends and Family:   . Attends Religious Services:   . Active Member of Clubs or Organizations:   . Attends Archivist Meetings:   Marland Kitchen Marital Status:   Intimate Partner Violence:   . Fear of Current or  Ex-Partner:   . Emotionally Abused:   Marland Kitchen Physically Abused:   . Sexually Abused:     Outpatient Medications Prior to Visit  Medication Sig Dispense Refill  . amiodarone (PACERONE) 200 MG tablet Take 1 tablet (200 mg total) by mouth daily. 90 tablet 1  . amLODipine (NORVASC) 5 MG tablet Take 5 mg by mouth daily.    Marland Kitchen apixaban (ELIQUIS) 5 MG TABS tablet Take 5 mg by mouth 2 (two) times daily.    Marland Kitchen atorvastatin (LIPITOR) 10 MG tablet Take 10 mg by mouth at bedtime.    . carvedilol (COREG) 3.125 MG tablet Take 1 tablet (3.125 mg total) by mouth 2 (two) times daily with a meal. 180 tablet 1  . donepezil (ARICEPT) 10 MG tablet Take 1 tablet (10 mg total) by mouth at bedtime. Start 1/2 tablet daily x 4 weeks and then 1 tablet daily 90 tablet 1  . furosemide (LASIX) 40 MG tablet Take 1 tablet twice weekly on Wednesday and Saturday. 24 tablet 1  . Inulin (FIBER CHOICE PO) Take 3 tablets by mouth daily.    Marland Kitchen levothyroxine (SYNTHROID) 137 MCG tablet TAKE 1 TABLET (137 MCG TOTAL) BY MOUTH DAILY BEFORE BREAKFAST. 90 tablet 0  . potassium chloride (K-DUR,KLOR-CON) 10 MEQ tablet Take 10 mEq by mouth daily as needed (for fluid retention.).     Marland Kitchen valsartan (DIOVAN) 320 MG tablet Take 1  tablet (320 mg total) by mouth daily. 90 tablet 0  . Vitamin D, Ergocalciferol, (DRISDOL) 1.25 MG (50000 UNIT) CAPS capsule Take 1 capsule (50,000 Units total) by mouth every Saturday. MORNING. 12 capsule 0  . zolpidem (AMBIEN) 10 MG tablet Take 1 tablet (10 mg total) by mouth at bedtime. 30 tablet 5  . meloxicam (MOBIC) 7.5 MG tablet Take 7.5 mg by mouth daily. Prescribed by PCP Dr. Benjamine Mola    . nitroGLYCERIN (NITROSTAT) 0.4 MG SL tablet Place 1 tablet (0.4 mg total) under the tongue every 5 (five) minutes as needed for chest pain. 25 tablet 5  . cloNIDine (CATAPRES) 0.1 MG tablet Take 0.1 mg by mouth 2 (two) times daily as needed (FOR BLOOD PRESSURE GREATER THAN 140/80).     Marland Kitchen erythromycin ophthalmic ointment Place 1 application into the left eye at bedtime. 3.5 g 0   No facility-administered medications prior to visit.    Allergies  Allergen Reactions  . Avelox [Moxifloxacin Hcl]   . Cartia Xt [Diltiazem Hcl] Nausea And Vomiting  . Pravachol [Pravastatin Sodium] Other (See Comments)    myalgia  . Prednisone Other (See Comments)    "makes me feel terrible"  . Zetia [Ezetimibe] Other (See Comments)    myalgias    Review of Systems  Constitutional: Negative for chills and fever.  HENT: Negative for ear pain and sore throat.   Respiratory: Negative for cough and shortness of breath.   Cardiovascular: Negative for chest pain.  Gastrointestinal: Negative for abdominal pain, constipation and diarrhea.  Endocrine: Negative for polydipsia, polyphagia and polyuria.  Genitourinary: Negative for dysuria.  Neurological: Negative for dizziness.       Objective:    Physical Exam Constitutional:      Appearance: He is obese.  Cardiovascular:     Rate and Rhythm: Normal rate and regular rhythm.     Pulses: Normal pulses.     Heart sounds: Normal heart sounds.  Pulmonary:     Effort: Pulmonary effort is normal.     Breath sounds: Normal breath sounds.  Abdominal:  General:  Bowel sounds are normal.     Palpations: There is no mass.     Tenderness: There is no abdominal tenderness.  Musculoskeletal:        General: No swelling.     Right lower leg: No edema.     Left lower leg: No edema.  Skin:    Findings: Lesion (raise, symmetrical, lesion with angioma about 1 cm on left arm) present.  Psychiatric:        Mood and Affect: Mood normal.        Behavior: Behavior normal.     BP 140/80 (BP Location: Right Arm, Patient Position: Sitting)   Pulse 70   Temp 97.7 F (36.5 C) (Temporal)   Resp 17   Ht 5' 10.5" (1.791 m)   Wt 249 lb (112.9 kg)   BMI 35.22 kg/m  Wt Readings from Last 3 Encounters:  02/06/20 249 lb (112.9 kg)  12/02/19 250 lb (113.4 kg)  11/12/19 254 lb 3.2 oz (115.3 kg)    Health Maintenance Due  Topic Date Due  . Hepatitis C Screening  Never done    There are no preventive care reminders to display for this patient.   Lab Results  Component Value Date   TSH 0.843 10/23/2017   Lab Results  Component Value Date   WBC 5.2 02/06/2020   HGB 14.8 02/06/2020   HCT 43.1 02/06/2020   MCV 90 02/06/2020   PLT 207 02/06/2020   Lab Results  Component Value Date   NA 141 02/06/2020   K 4.5 02/06/2020   CO2 25 02/06/2020   GLUCOSE 94 02/06/2020   BUN 18 02/06/2020   CREATININE 1.33 (H) 02/06/2020   BILITOT 0.8 02/06/2020   ALKPHOS 107 02/06/2020   AST 18 02/06/2020   ALT 15 02/06/2020   PROT 6.7 02/06/2020   ALBUMIN 4.2 02/06/2020   CALCIUM 9.1 02/06/2020   ANIONGAP 8 01/18/2018   Lab Results  Component Value Date   CHOL 202 (H) 02/06/2020   Lab Results  Component Value Date   HDL 35 (L) 02/06/2020   Lab Results  Component Value Date   LDLCALC 143 (H) 02/06/2020   Lab Results  Component Value Date   TRIG 131 02/06/2020   Lab Results  Component Value Date   CHOLHDL 5.8 (H) 02/06/2020   No results found for: HGBA1C     Assessment & Plan:  1. Mixed hyperlipidemia Poorly controlled.  No changes to  medicines.  Continue to work on eating a healthy diet and exercise.  Labs drawn today.  - Lipid panel  2. Hypertensive heart disease with chronic diastolic congestive heart failure ([place]) Well controlled.  No changes to medicines.  Continue to work on eating a healthy diet and exercise.  Labs drawn today.  - cbc - cmp  3. Hereditary thrombophilia ([place]) Due to eliquis.  4. Chronic atrial fibrillation ([facility]) The current medical regimen is effective;  continue present plan and medications.  5. Hypothyroidism (acquired) - TSH; Future  6. Sequela of cardioembolic stroke Vision is affected.   8. On amiodarone therapy  Check tsh.  Orders Placed This Encounter  Procedures  . CBC with Differential/Platelet  . Comprehensive metabolic panel  . Lipid panel  . TSH  . Cardiovascular Risk Assessment     Follow-up: Return in about 3 months (around 05/08/2020) for fasting.  An After Visit Summary was printed and given to the patient.  [name] [facility] [phone]

## 2020-02-07 LAB — COMPREHENSIVE METABOLIC PANEL
ALT: 15 IU/L (ref 0–44)
AST: 18 IU/L (ref 0–40)
Albumin/Globulin Ratio: 1.7 (ref 1.2–2.2)
Albumin: 4.2 g/dL (ref 3.7–4.7)
Alkaline Phosphatase: 107 IU/L (ref 48–121)
BUN/Creatinine Ratio: 14 (ref 10–24)
BUN: 18 mg/dL (ref 8–27)
Bilirubin Total: 0.8 mg/dL (ref 0.0–1.2)
CO2: 25 mmol/L (ref 20–29)
Calcium: 9.1 mg/dL (ref 8.6–10.2)
Chloride: 103 mmol/L (ref 96–106)
Creatinine, Ser: 1.33 mg/dL — ABNORMAL HIGH (ref 0.76–1.27)
GFR calc Af Amer: 62 mL/min/{1.73_m2} (ref >59)
GFR calc non Af Amer: 53 mL/min/{1.73_m2} — ABNORMAL LOW (ref >59)
Globulin, Total: 2.5 g/dL (ref 1.5–4.5)
Glucose: 94 mg/dL (ref 65–99)
Potassium: 4.5 mmol/L (ref 3.5–5.2)
Sodium: 141 mmol/L (ref 134–144)
Total Protein: 6.7 g/dL (ref 6.0–8.5)

## 2020-02-07 LAB — LIPID PANEL
Chol/HDL Ratio: 5.8 ratio — ABNORMAL HIGH (ref 0.0–5.0)
Cholesterol, Total: 202 mg/dL — ABNORMAL HIGH (ref 100–199)
HDL: 35 mg/dL — ABNORMAL LOW (ref >39)
LDL Chol Calc (NIH): 143 mg/dL — ABNORMAL HIGH (ref 0–99)
Triglycerides: 131 mg/dL (ref 0–149)
VLDL Cholesterol Cal: 24 mg/dL (ref 5–40)

## 2020-02-07 LAB — CBC WITH DIFFERENTIAL/PLATELET
Basophils Absolute: 0.1 10*3/uL (ref 0.0–0.2)
Basos: 1 %
EOS (ABSOLUTE): 0.2 10*3/uL (ref 0.0–0.4)
Eos: 4 %
Hematocrit: 43.1 % (ref 37.5–51.0)
Hemoglobin: 14.8 g/dL (ref 13.0–17.7)
Immature Grans (Abs): 0 10*3/uL (ref 0.0–0.1)
Immature Granulocytes: 0 %
Lymphocytes Absolute: 1.6 10*3/uL (ref 0.7–3.1)
Lymphs: 30 %
MCH: 31 pg (ref 26.6–33.0)
MCHC: 34.3 g/dL (ref 31.5–35.7)
MCV: 90 fL (ref 79–97)
Monocytes Absolute: 0.8 10*3/uL (ref 0.1–0.9)
Monocytes: 15 %
Neutrophils Absolute: 2.6 10*3/uL (ref 1.4–7.0)
Neutrophils: 50 %
Platelets: 207 10*3/uL (ref 150–450)
RBC: 4.77 x10E6/uL (ref 4.14–5.80)
RDW: 13 % (ref 11.6–15.4)
WBC: 5.2 10*3/uL (ref 3.4–10.8)

## 2020-02-07 LAB — CARDIOVASCULAR RISK ASSESSMENT

## 2020-02-12 ENCOUNTER — Other Ambulatory Visit: Payer: Self-pay | Admitting: Family Medicine

## 2020-02-12 MED ORDER — VALSARTAN 320 MG PO TABS: 320.0000 mg | ORAL_TABLET | Freq: Every day | ORAL | 3 refills | Status: DC

## 2020-02-18 ENCOUNTER — Other Ambulatory Visit: Payer: Self-pay | Admitting: Family Medicine

## 2020-03-20 ENCOUNTER — Other Ambulatory Visit: Payer: Self-pay | Admitting: Physician Assistant

## 2020-03-20 ENCOUNTER — Other Ambulatory Visit: Payer: Self-pay

## 2020-03-20 MED ORDER — ZOLPIDEM TARTRATE 10 MG PO TABS: 10.0000 mg | ORAL_TABLET | Freq: Every day | ORAL | 3 refills | Status: DC

## 2020-03-30 ENCOUNTER — Other Ambulatory Visit: Payer: Self-pay

## 2020-03-30 DIAGNOSIS — I110 Hypertensive heart disease with heart failure: Secondary | ICD-10-CM

## 2020-03-30 MED ORDER — AMLODIPINE BESYLATE 5 MG PO TABS: 5.0000 mg | ORAL_TABLET | Freq: Every day | ORAL | 1 refills | Status: DC

## 2020-03-30 NOTE — Progress Notes (Unsigned)
Received faxed refill request for Amlodipine 5 MG. Sent to pharmacy.

## 2020-03-31 ENCOUNTER — Other Ambulatory Visit: Payer: Self-pay | Admitting: Physician Assistant

## 2020-04-28 DIAGNOSIS — L720 Epidermal cyst: Secondary | ICD-10-CM | POA: Diagnosis not present

## 2020-04-28 DIAGNOSIS — L821 Other seborrheic keratosis: Secondary | ICD-10-CM | POA: Diagnosis not present

## 2020-04-28 DIAGNOSIS — L578 Other skin changes due to chronic exposure to nonionizing radiation: Secondary | ICD-10-CM | POA: Diagnosis not present

## 2020-04-28 DIAGNOSIS — L570 Actinic keratosis: Secondary | ICD-10-CM | POA: Diagnosis not present

## 2020-05-12 ENCOUNTER — Ambulatory Visit: Payer: Medicare Other | Admitting: Family Medicine

## 2020-05-13 DIAGNOSIS — I219 Acute myocardial infarction, unspecified: Secondary | ICD-10-CM | POA: Insufficient documentation

## 2020-05-13 DIAGNOSIS — R7301 Impaired fasting glucose: Secondary | ICD-10-CM | POA: Insufficient documentation

## 2020-05-13 DIAGNOSIS — I110 Hypertensive heart disease with heart failure: Secondary | ICD-10-CM | POA: Insufficient documentation

## 2020-05-13 DIAGNOSIS — E559 Vitamin D deficiency, unspecified: Secondary | ICD-10-CM | POA: Insufficient documentation

## 2020-05-13 DIAGNOSIS — F5101 Primary insomnia: Secondary | ICD-10-CM | POA: Insufficient documentation

## 2020-05-13 DIAGNOSIS — J449 Chronic obstructive pulmonary disease, unspecified: Secondary | ICD-10-CM | POA: Insufficient documentation

## 2020-05-13 DIAGNOSIS — E785 Hyperlipidemia, unspecified: Secondary | ICD-10-CM | POA: Insufficient documentation

## 2020-05-14 ENCOUNTER — Ambulatory Visit (INDEPENDENT_AMBULATORY_CARE_PROVIDER_SITE_OTHER): Payer: Medicare Other | Admitting: Cardiology

## 2020-05-14 ENCOUNTER — Other Ambulatory Visit: Payer: Self-pay

## 2020-05-14 ENCOUNTER — Encounter: Payer: Self-pay | Admitting: Cardiology

## 2020-05-14 VITALS — BP 137/78 | HR 53 | Ht 70.5 in | Wt 248.4 lb

## 2020-05-14 DIAGNOSIS — I483 Typical atrial flutter: Secondary | ICD-10-CM

## 2020-05-14 DIAGNOSIS — I5032 Chronic diastolic (congestive) heart failure: Secondary | ICD-10-CM

## 2020-05-14 DIAGNOSIS — Z79899 Other long term (current) drug therapy: Secondary | ICD-10-CM | POA: Diagnosis not present

## 2020-05-14 DIAGNOSIS — I110 Hypertensive heart disease with heart failure: Secondary | ICD-10-CM | POA: Diagnosis not present

## 2020-05-14 DIAGNOSIS — I2510 Atherosclerotic heart disease of native coronary artery without angina pectoris: Secondary | ICD-10-CM

## 2020-05-14 DIAGNOSIS — I480 Paroxysmal atrial fibrillation: Secondary | ICD-10-CM

## 2020-05-14 DIAGNOSIS — Z7901 Long term (current) use of anticoagulants: Secondary | ICD-10-CM | POA: Diagnosis not present

## 2020-05-14 DIAGNOSIS — E7800 Pure hypercholesterolemia, unspecified: Secondary | ICD-10-CM

## 2020-05-14 NOTE — Patient Instructions (Signed)
Medication Instructions:  Your physician recommends that you continue on your current medications as directed. Please refer to the Current Medication list given to you today.  *If you need a refill on your cardiac medications before your next appointment, please call your pharmacy*   Lab Work: Your physician recommends that you return for lab work in: TODAY TSH, T3, T4, CMP, ProBNP If you have labs (blood work) drawn today and your tests are completely normal, you will receive your results only by: Marland Kitchen MyChart Message (if you have MyChart) OR . A paper copy in the mail If you have any lab test that is abnormal or we need to change your treatment, we will call you to review the results.   Testing/Procedures: None   Follow-Up: At [facility], you and your health needs are our priority.  As part of our continuing mission to provide you with exceptional heart care, we have created designated Provider Care Teams.  These Care Teams include your primary Cardiologist (physician) and Advanced Practice Providers (APPs -  Physician Assistants and Nurse Practitioners) who all work together to provide you with the care you need, when you need it.  We recommend signing up for the patient portal called "MyChart".  Sign up information is provided on this After Visit Summary.  MyChart is used to connect with patients for Virtual Visits (Telemedicine).  Patients are able to view lab/test results, encounter notes, upcoming appointments, etc.  Non-urgent messages can be sent to your provider as well.   To learn more about what you can do with MyChart, go to [email or web address].    Your next appointment:   6 month(s)  The format for your next appointment:   In Person  Provider:   Shirlee More, MD   Other Instructions

## 2020-05-14 NOTE — Progress Notes (Signed)
Cardiology Office Note:    Date:  05/14/2020   ID:  John Kemp, DOB 1946-06-27, MRN [id number]  PCP:  [name], MD  Cardiologist:  Shirlee More, MD    Referring MD: [name], MD    ASSESSMENT:    1. PAF (paroxysmal atrial fibrillation) ([place])   2. Typical atrial flutter ([place])   3. On amiodarone therapy   4. Anticoagulated   5. Hypertensive heart disease with chronic diastolic congestive heart failure ([place])   6. Chronic diastolic heart failure ([place])   7. CAD in native artery   8. Pure hypercholesterolemia    PLAN:    In order of problems listed above:  1. Stable maintaining sinus rhythm continue low-dose amiodarone anticoagulation and check liver and thyroid test regarding toxicity 2. BP at target failure compensated continue his current antihypertensives and diuretic 3. Stable CAD surgery having no angina continue medical therapy including beta-blocker calcium channel blocker statin at this time would not recommend ischemia evaluation 4. Continue high intensity statin with CAD check lipid profile liver function   Next appointment: 6 months   Medication Adjustments/Labs and Tests Ordered: Current medicines are reviewed at length with the patient today.  Concerns regarding medicines are outlined above.  Orders Placed This Encounter  Procedures  . Comprehensive metabolic panel  . TSH+T4F+T3Free  . Pro b natriuretic peptide (BNP)  . EKG 12-Lead   No orders of the defined types were placed in this encounter.   Chief Complaint  Patient presents with  . Follow-up  . Coronary Artery Disease  . Atrial Flutter    History of Present Illness:    John Kemp is a 74 y.o. male with a hx of coronary artery disease with CABG hypertensive heart disease chronic diastolic heart failure paroxysmal atrial fibrillation flutter suppressed with amiodarone long-term anticoagulation hyperlipidemia and previous stroke last seen 11/12/2019. Compliance with diet,  lifestyle and medications: Yes  He was seen at [facility] ED 01/19/2020 with shortness of breath.  CBC was normal BMP showed a creatinine 1.30 chest x-ray was normal he was diagnosed with acute bronchitis and discharged on a course of bronchodilator and amoxicillin.  He is fully recovered presently no shortness of breath wheezing cough sputum chest pain edema orthopnea.  He tolerates his anticoagulant without bleeding. Past Medical History:  Diagnosis Date  . A-fib ([place])   . Anticoagulated 01/21/2015  . Atherosclerotic heart disease of native coronary artery without angina pectoris   . Blepharitis of left lower eyelid 12/02/2019  . CAD in native artery 01/21/2015  . Cancer ([place])    skin cancer  . Chest pain 04/02/2017  . CHF (congestive heart failure) ([place]) 11/05/2019  . Chronic diastolic (congestive) heart failure ([place])   . Chronic diastolic heart failure ([place]) 01/21/2015   Last Assessment & Plan:  Is congestive heart failure is mildly decompensated he is edematous he'll increase the dose of his diuretic today the lab work performed including an ARB referred to care transitions for home care he'll follow-up in my office with me in 4-6 weeks daily weights sodium restrictionand compliance of medications beinghemoglobin of will  . Chronic obstructive pulmonary disease (COPD) ([place])   . Dyspnea 12/31/2014   Followed in Pulmonary clinic/ [facility]/ [place]  - amiodarone rx 11/2014 >>> - PFTs rec 12/31/2014    . Heart attack ([place])   . High cholesterol 10/20/2017  . Hx of CABG 03/02/2017  . Hyperlipidemia   . Hypertension   . Hypertensive heart disease 01/21/2015  .  Hypertensive heart disease with heart failure ([place])   . Hypothyroidism   . Hypothyroidism (acquired) 04/24/2018  . Impaired fasting glucose   . Leg fatigue 03/03/2017  . Obesity 04/02/2017  . On amiodarone therapy 01/21/2015  . PAF (paroxysmal atrial fibrillation) ([place])   . Primary insomnia   . Squamous cell carcinoma 11/05/2019  . Stroke  (cerebrum) ([place]) 09/2016   right superior quadrantanopsia  . Stroke ([place])   . Typical atrial flutter ([place]) 01/21/2015  . Vitamin D deficiency, unspecified     Past Surgical History:  Procedure Laterality Date  . APPENDECTOMY  1956  . CARDIAC CATHETERIZATION    . CARDIOVERSION  2002  . CHOLECYSTECTOMY  2006  . CORONARY ARTERY BYPASS GRAFT  2001  . HERNIA REPAIR    . RADIOLOGY WITH ANESTHESIA Bilateral 01/18/2018   Procedure: MRI WITH ANESTHESIA LUMBAR SPINE WITH AND WITHOUT CONTRAST;  Surgeon: Radiologist, Medication, MD;  Location: [place];  Service: Radiology;  Laterality: Bilateral;  . ROTATOR CUFF REPAIR Left   . TONSILLECTOMY  1975    Current Medications: Current Meds  Medication Sig  . amiodarone (PACERONE) 200 MG tablet Take 1 tablet (200 mg total) by mouth daily.  Marland Kitchen amLODipine (NORVASC) 5 MG tablet Take 1 tablet (5 mg total) by mouth daily.  Marland Kitchen apixaban (ELIQUIS) 5 MG TABS tablet Take 5 mg by mouth 2 (two) times daily.  Marland Kitchen atorvastatin (LIPITOR) 10 MG tablet Take 10 mg by mouth at bedtime.  . carvedilol (COREG) 3.125 MG tablet Take 1 tablet (3.125 mg total) by mouth 2 (two) times daily with a meal.  . donepezil (ARICEPT) 10 MG tablet Take 1 tablet (10 mg total) by mouth at bedtime. Start 1/2 tablet daily x 4 weeks and then 1 tablet daily  . furosemide (LASIX) 40 MG tablet Take 1 tablet twice weekly on Wednesday and Saturday.  . Inulin (FIBER CHOICE PO) Take 3 tablets by mouth daily.  Marland Kitchen levothyroxine (SYNTHROID) 137 MCG tablet TAKE 1 TABLET (137 MCG TOTAL) BY MOUTH DAILY BEFORE BREAKFAST.  . meloxicam (MOBIC) 7.5 MG tablet Take 1 tablet (7.5 mg total) by mouth daily. Prescribed by PCP Dr. Benjamine Mola  . nitroGLYCERIN (NITROSTAT) 0.4 MG SL tablet Place 1 tablet (0.4 mg total) under the tongue every 5 (five) minutes as needed for chest pain.  . potassium chloride (K-DUR,KLOR-CON) 10 MEQ tablet Take 10 mEq by mouth daily as needed (for fluid retention.).   Marland Kitchen valsartan (DIOVAN) 320  MG tablet Take 1 tablet (320 mg total) by mouth daily.  . Vitamin D, Ergocalciferol, (DRISDOL) 1.25 MG (50000 UNIT) CAPS capsule TAKE 1 CAPSULE (50,000 UNITS TOTAL) BY MOUTH EVERY SATURDAY. MORNING.  Marland Kitchen zolpidem (AMBIEN) 10 MG tablet TAKE ONE TABLET BY MOUTH AT BEDTIME     Allergies:   Avelox [moxifloxacin hcl], Cartia xt [diltiazem hcl], Pravachol [pravastatin sodium], Prednisone, and Zetia [ezetimibe]   Social History   Socioeconomic History  . Marital status: Married    Spouse name: Not on file  . Number of children: Not on file  . Years of education: Not on file  . Highest education level: Not on file  Occupational History  . Occupation: Retired  Tobacco Use  . Smoking status: Former Smoker    Packs/day: 1.00    Years: 15.00    Pack years: 15.00    Types: Cigarettes    Quit date: 08/29/1978    Years since quitting: 41.7  . Smokeless tobacco: Never Used  Vaping Use  . Vaping Use:  Never used  Substance and Sexual Activity  . Alcohol use: No    Alcohol/week: 0.0 standard drinks  . Drug use: No  . Sexual activity: Not Currently  Other Topics Concern  . Not on file  Social History Narrative  . Not on file   Social Determinants of Health   Financial Resource Strain:   . Difficulty of Paying Living Expenses: Not on file  Food Insecurity:   . Worried About Charity fundraiser in the Last Year: Not on file  . Ran Out of Food in the Last Year: Not on file  Transportation Needs:   . Lack of Transportation (Medical): Not on file  . Lack of Transportation (Non-Medical): Not on file  Physical Activity:   . Days of Exercise per Week: Not on file  . Minutes of Exercise per Session: Not on file  Stress:   . Feeling of Stress : Not on file  Social Connections:   . Frequency of Communication with Friends and Family: Not on file  . Frequency of Social Gatherings with Friends and Family: Not on file  . Attends Religious Services: Not on file  . Active Member of Clubs or  Organizations: Not on file  . Attends Archivist Meetings: Not on file  . Marital Status: Not on file     Family History: The patient's family history includes Arrhythmia in his father; Breast cancer in his mother; Diabetes type II in his sister; Heart disease in his father. ROS:   Please see the history of present illness.    All other systems reviewed and are negative.  EKGs/Labs/Other Studies Reviewed:    The following studies were reviewed today:  EKG:  EKG ordered today and personally reviewed.  The ekg ordered today demonstrates sinus bradycardia 53 bpm otherwise normal  Recent Labs: 02/06/2020: ALT 15; BUN 18; Creatinine, Ser 1.33; Hemoglobin 14.8; Platelets 207; Potassium 4.5; Sodium 141  Recent Lipid Panel    Component Value Date/Time   CHOL 202 (H) 02/06/2020 1030   TRIG 131 02/06/2020 1030   HDL 35 (L) 02/06/2020 1030   CHOLHDL 5.8 (H) 02/06/2020 1030   CHOLHDL 5.9 04/02/2017 0213   VLDL 35 04/02/2017 0213   LDLCALC 143 (H) 02/06/2020 1030    Physical Exam:    VS:  BP 137/78   Pulse (!) 53   Ht 5' 10.5" (1.791 m)   Wt 248 lb 6.4 oz (112.7 kg)   SpO2 95%   BMI 35.14 kg/m     Wt Readings from Last 3 Encounters:  05/14/20 248 lb 6.4 oz (112.7 kg)  02/06/20 249 lb (112.9 kg)  12/02/19 250 lb (113.4 kg)     GEN:  Well nourished, well developed in no acute distress HEENT: Normal NECK: No JVD; No carotid bruits LYMPHATICS: No lymphadenopathy CARDIAC: RRR, no murmurs, rubs, gallops RESPIRATORY:  Clear to auscultation without rales, wheezing or rhonchi  ABDOMEN: Soft, non-tender, non-distended MUSCULOSKELETAL:  No edema; No deformity  SKIN: Warm and dry NEUROLOGIC:  Alert and oriented x 3 PSYCHIATRIC:  Normal affect    Signed, Shirlee More, MD  05/14/2020 11:03 AM    [place]

## 2020-05-15 ENCOUNTER — Telehealth: Payer: Self-pay

## 2020-05-15 LAB — TSH+T4F+T3FREE
Free T4: 1.88 ng/dL — ABNORMAL HIGH (ref 0.82–1.77)
T3, Free: 2.4 pg/mL (ref 2.0–4.4)
TSH: 1.66 u[IU]/mL (ref 0.450–4.500)

## 2020-05-15 LAB — COMPREHENSIVE METABOLIC PANEL
ALT: 22 IU/L (ref 0–44)
AST: 21 IU/L (ref 0–40)
Albumin/Globulin Ratio: 1.8 (ref 1.2–2.2)
Albumin: 4.4 g/dL (ref 3.7–4.7)
Alkaline Phosphatase: 106 IU/L (ref 44–121)
BUN/Creatinine Ratio: 11 (ref 10–24)
BUN: 13 mg/dL (ref 8–27)
Bilirubin Total: 0.8 mg/dL (ref 0.0–1.2)
CO2: 25 mmol/L (ref 20–29)
Calcium: 9.1 mg/dL (ref 8.6–10.2)
Chloride: 101 mmol/L (ref 96–106)
Creatinine, Ser: 1.16 mg/dL (ref 0.76–1.27)
GFR calc Af Amer: 72 mL/min/{1.73_m2} (ref >59)
GFR calc non Af Amer: 63 mL/min/{1.73_m2} (ref >59)
Globulin, Total: 2.5 g/dL (ref 1.5–4.5)
Glucose: 101 mg/dL — ABNORMAL HIGH (ref 65–99)
Potassium: 3.6 mmol/L (ref 3.5–5.2)
Sodium: 140 mmol/L (ref 134–144)
Total Protein: 6.9 g/dL (ref 6.0–8.5)

## 2020-05-15 LAB — PRO B NATRIURETIC PEPTIDE: NT-Pro BNP: 330 pg/mL (ref 0–376)

## 2020-05-15 NOTE — Telephone Encounter (Signed)
Spoke with patient regarding results and recommendation.  Patient verbalizes understanding and is agreeable to plan of care. Advised patient to call back with any issues or concerns.  

## 2020-05-15 NOTE — Telephone Encounter (Signed)
-----   Message from [name], MD sent at 05/15/2020  8:01 AM EDT ----- Good result no changes

## 2020-05-15 NOTE — Telephone Encounter (Signed)
Left message on patients voicemail to please return our call.   

## 2020-05-19 ENCOUNTER — Other Ambulatory Visit: Payer: Self-pay

## 2020-05-19 MED ORDER — ATORVASTATIN CALCIUM 10 MG PO TABS: 10.0000 mg | ORAL_TABLET | Freq: Every day | ORAL | 0 refills | Status: DC

## 2020-05-22 ENCOUNTER — Encounter: Payer: Self-pay | Admitting: Family Medicine

## 2020-05-22 ENCOUNTER — Other Ambulatory Visit: Payer: Self-pay

## 2020-05-22 ENCOUNTER — Ambulatory Visit (INDEPENDENT_AMBULATORY_CARE_PROVIDER_SITE_OTHER): Payer: Medicare Other | Admitting: Family Medicine

## 2020-05-22 VITALS — BP 130/68 | HR 57 | Temp 97.7°F | Ht 71.0 in | Wt 249.0 lb

## 2020-05-22 DIAGNOSIS — E6609 Other obesity due to excess calories: Secondary | ICD-10-CM

## 2020-05-22 DIAGNOSIS — I110 Hypertensive heart disease with heart failure: Secondary | ICD-10-CM | POA: Diagnosis not present

## 2020-05-22 DIAGNOSIS — E782 Mixed hyperlipidemia: Secondary | ICD-10-CM | POA: Diagnosis not present

## 2020-05-22 DIAGNOSIS — Z23 Encounter for immunization: Secondary | ICD-10-CM

## 2020-05-22 DIAGNOSIS — I6930 Unspecified sequelae of cerebral infarction: Secondary | ICD-10-CM | POA: Diagnosis not present

## 2020-05-22 DIAGNOSIS — I482 Chronic atrial fibrillation, unspecified: Secondary | ICD-10-CM | POA: Diagnosis not present

## 2020-05-22 DIAGNOSIS — R7301 Impaired fasting glucose: Secondary | ICD-10-CM

## 2020-05-22 DIAGNOSIS — Z6834 Body mass index (BMI) 34.0-34.9, adult: Secondary | ICD-10-CM | POA: Diagnosis not present

## 2020-05-22 DIAGNOSIS — D688 Other specified coagulation defects: Secondary | ICD-10-CM | POA: Diagnosis not present

## 2020-05-22 DIAGNOSIS — I5032 Chronic diastolic (congestive) heart failure: Secondary | ICD-10-CM | POA: Diagnosis not present

## 2020-05-22 NOTE — Progress Notes (Signed)
Acute Office Visit  Subjective:    Patient ID: John Kemp, male    DOB: May 04, 1946, 74 y.o.   MRN: [id number]  Chief Complaint  Patient presents with  . Hypertension  . Hyperlipidemia  . Diabetes    HPI  Pt presents for follow up of hypertensive heart disease.  Date of diagnosis 03/2013.  His current cardiac medication regimen includes a diuretic ( Lasix 40 mg twice weeklly ), a beta-blocker ( Coreg 3.125 mg BID ), a calcium channel blocker ( Norvasc 5 mg QD ), an angiotensin receptor blocker ( Diovan ), Amiodarone 200 mg QD. He is tolerating the medication well without side effects.  Compliance with treatment has been good; he takes his medication as directed, maintains his diet and exercise regimen, and follows up as directed.  Concurrent health problems include hyperlipidemia, coronary artery disease, and congestive heart failure.      Paroxysmal atrial fibrillation details; he is here for routine follow-up for atrial fibrillation. Current related medications include amiodarone,  a beta blocker for rate control, and Eliquis.   He is extremely compliant with his medication regimen.   He denies any awareness of a disturbance in heart rate or rhythm. There has been a prior attempt at electrical cardioversion. This resulted in a brief conversion to normal sinus rhythm with a return to atrial fibrillation. He has been hospitalized for complications related to the atrial fibrillation 2 times. His medical history is pertinent for hypertension, coronary artery disease ( s/p CABG ), congestive heart failure and COPD.      Dx with mixed hyperlipidemia; date of diagnosis 05/09/2013.  Current treatment includes Lipitor.  Compliance with treatment has been good; he takes his medication as directed, maintains his low cholesterol diet, and follows up as directed.  He denies experiencing any hypercholesterolemia related symptoms.        In regard to the chronic diastolic (congestive) heart failure, he  is here today for routine follow-up.  Specifically this is diastolic heart failure.  His CHF was first diagnosed in 2002.  The course of the disease has been stable.  Currently, his treatment regimen consists of a diuretic ( furosemide ), Coreg, and Amiodarone.  He tries to take medications faithfully and weigh himself daily.  He denies any adverse medication effects.  Medical history is pertinent for co-existant atrial fibrillation, COPD,  hypertension, hyperlipidemia, and hypothyroidism.    Concerning hypothryroidism, date of diagnosis 12/22/2014.  He is currently taking Synthroid, 137 mcg daily.    Past Medical History:  Diagnosis Date  . A-fib ([place])   . Anticoagulated 01/21/2015  . Atherosclerotic heart disease of native coronary artery without angina pectoris   . Blepharitis of left lower eyelid 12/02/2019  . CAD in native artery 01/21/2015  . Cancer ([place])    skin cancer  . Chest pain 04/02/2017  . CHF (congestive heart failure) ([place]) 11/05/2019  . Chronic diastolic (congestive) heart failure ([place])   . Chronic diastolic heart failure ([place]) 01/21/2015   Last Assessment & Plan:  Is congestive heart failure is mildly decompensated he is edematous he'll increase the dose of his diuretic today the lab work performed including an ARB referred to care transitions for home care he'll follow-up in my office with me in 4-6 weeks daily weights sodium restrictionand compliance of medications beinghemoglobin of will  . Chronic obstructive pulmonary disease (COPD) ([place])   . Dyspnea 12/31/2014   Followed in Pulmonary clinic/ [facility]/ [place]  - amiodarone rx 11/2014 >>> -  PFTs rec 12/31/2014    . Heart attack ([place])   . High cholesterol 10/20/2017  . Hx of CABG 03/02/2017  . Hyperlipidemia   . Hypertension   . Hypertensive heart disease 01/21/2015  . Hypertensive heart disease with heart failure ([place])   . Hypothyroidism   . Hypothyroidism (acquired) 04/24/2018  . Impaired fasting glucose   . Leg fatigue  03/03/2017  . Obesity 04/02/2017  . On amiodarone therapy 01/21/2015  . PAF (paroxysmal atrial fibrillation) ([place])   . Primary insomnia   . Squamous cell carcinoma 11/05/2019  . Stroke (cerebrum) ([place]) 09/2016   right superior quadrantanopsia  . Stroke ([place])   . Typical atrial flutter ([place]) 01/21/2015  . Vitamin D deficiency, unspecified     Past Surgical History:  Procedure Laterality Date  . APPENDECTOMY  1956  . CARDIAC CATHETERIZATION    . CARDIOVERSION  2002  . CHOLECYSTECTOMY  2006  . CORONARY ARTERY BYPASS GRAFT  2001  . HERNIA REPAIR    . RADIOLOGY WITH ANESTHESIA Bilateral 01/18/2018   Procedure: MRI WITH ANESTHESIA LUMBAR SPINE WITH AND WITHOUT CONTRAST;  Surgeon: Radiologist, Medication, MD;  Location: [place];  Service: Radiology;  Laterality: Bilateral;  . ROTATOR CUFF REPAIR Left   . TONSILLECTOMY  1975    Family History  Problem Relation Age of Onset  . Heart disease Father   . Arrhythmia Father   . Breast cancer Mother   . Diabetes type II Sister     Social History   Socioeconomic History  . Marital status: Married    Spouse name: Not on file  . Number of children: Not on file  . Years of education: Not on file  . Highest education level: Not on file  Occupational History  . Occupation: Retired  Tobacco Use  . Smoking status: Former Smoker    Packs/day: 1.00    Years: 15.00    Pack years: 15.00    Types: Cigarettes    Quit date: 08/29/1978    Years since quitting: 41.7  . Smokeless tobacco: Never Used  Vaping Use  . Vaping Use: Never used  Substance and Sexual Activity  . Alcohol use: No    Alcohol/week: 0.0 standard drinks  . Drug use: No  . Sexual activity: Not Currently  Other Topics Concern  . Not on file  Social History Narrative  . Not on file   Social Determinants of Health   Financial Resource Strain:   . Difficulty of Paying Living Expenses: Not on file  Food Insecurity:   . Worried About Charity fundraiser in the Last Year: Not on  file  . Ran Out of Food in the Last Year: Not on file  Transportation Needs:   . Lack of Transportation (Medical): Not on file  . Lack of Transportation (Non-Medical): Not on file  Physical Activity:   . Days of Exercise per Week: Not on file  . Minutes of Exercise per Session: Not on file  Stress:   . Feeling of Stress : Not on file  Social Connections:   . Frequency of Communication with Friends and Family: Not on file  . Frequency of Social Gatherings with Friends and Family: Not on file  . Attends Religious Services: Not on file  . Active Member of Clubs or Organizations: Not on file  . Attends Archivist Meetings: Not on file  . Marital Status: Not on file  Intimate Partner Violence:   . Fear of Current or Ex-Partner: Not  on file  . Emotionally Abused: Not on file  . Physically Abused: Not on file  . Sexually Abused: Not on file    Outpatient Medications Prior to Visit  Medication Sig Dispense Refill  . amiodarone (PACERONE) 200 MG tablet Take 1 tablet (200 mg total) by mouth daily. 90 tablet 1  . amLODipine (NORVASC) 5 MG tablet Take 1 tablet (5 mg total) by mouth daily. 90 tablet 1  . apixaban (ELIQUIS) 5 MG TABS tablet Take 5 mg by mouth 2 (two) times daily.    Marland Kitchen atorvastatin (LIPITOR) 10 MG tablet Take 1 tablet (10 mg total) by mouth at bedtime. 90 tablet 0  . carvedilol (COREG) 3.125 MG tablet Take 1 tablet (3.125 mg total) by mouth 2 (two) times daily with a meal. 180 tablet 1  . donepezil (ARICEPT) 10 MG tablet Take 1 tablet (10 mg total) by mouth at bedtime. Start 1/2 tablet daily x 4 weeks and then 1 tablet daily 90 tablet 1  . furosemide (LASIX) 40 MG tablet Take 1 tablet twice weekly on Wednesday and Saturday. 24 tablet 1  . Inulin (FIBER CHOICE PO) Take 3 tablets by mouth daily.    Marland Kitchen levothyroxine (SYNTHROID) 137 MCG tablet TAKE 1 TABLET (137 MCG TOTAL) BY MOUTH DAILY BEFORE BREAKFAST. 90 tablet 0  . meloxicam (MOBIC) 7.5 MG tablet Take 1 tablet (7.5 mg  total) by mouth daily. Prescribed by PCP Dr. Benjamine Mola 90 tablet 1  . nitroGLYCERIN (NITROSTAT) 0.4 MG SL tablet Place 1 tablet (0.4 mg total) under the tongue every 5 (five) minutes as needed for chest pain. 25 tablet 5  . potassium chloride (K-DUR,KLOR-CON) 10 MEQ tablet Take 10 mEq by mouth daily as needed (for fluid retention.).     Marland Kitchen valsartan (DIOVAN) 320 MG tablet Take 1 tablet (320 mg total) by mouth daily. 90 tablet 3  . Vitamin D, Ergocalciferol, (DRISDOL) 1.25 MG (50000 UNIT) CAPS capsule TAKE 1 CAPSULE (50,000 UNITS TOTAL) BY MOUTH EVERY SATURDAY. MORNING. 12 capsule 0  . zolpidem (AMBIEN) 10 MG tablet TAKE ONE TABLET BY MOUTH AT BEDTIME 30 tablet 3   No facility-administered medications prior to visit.    Allergies  Allergen Reactions  . Avelox [Moxifloxacin Hcl]   . Cartia Xt [Diltiazem Hcl] Nausea And Vomiting  . Pravachol [Pravastatin Sodium] Other (See Comments)    myalgia  . Prednisone Other (See Comments)    "makes me feel terrible"  . Zetia [Ezetimibe] Other (See Comments)    myalgias    Review of Systems  Constitutional: Negative for chills and fever.  HENT: Negative for ear pain and sore throat.   Respiratory: Negative for cough and shortness of breath.   Cardiovascular: Negative for chest pain, palpitations and leg swelling.  Gastrointestinal: Negative for abdominal pain, constipation, diarrhea, nausea and vomiting.  Endocrine: Negative for polydipsia, polyphagia and polyuria.  Genitourinary: Negative for dysuria.  Musculoskeletal: Negative for arthralgias and back pain.  Neurological: Negative for dizziness.  Psychiatric/Behavioral: Negative for dysphoric mood. The patient is not nervous/anxious.        Objective:    Physical Exam Constitutional:      Appearance: He is obese.  Cardiovascular:     Rate and Rhythm: Normal rate and regular rhythm.     Pulses: Normal pulses.     Heart sounds: Normal heart sounds.  Pulmonary:     Effort: Pulmonary  effort is normal.     Breath sounds: Normal breath sounds.  Abdominal:  General: Bowel sounds are normal.     Palpations: There is no mass.     Tenderness: There is no abdominal tenderness.  Musculoskeletal:        General: No swelling.     Right lower leg: No edema.     Left lower leg: No edema.  Skin:    Findings: No lesion.  Psychiatric:        Mood and Affect: Mood normal.        Behavior: Behavior normal.     BP 130/68   Pulse (!) 57   Temp 97.7 F (36.5 C)   Ht 5\' 11"  (1.803 m)   Wt 249 lb (112.9 kg)   SpO2 99%   BMI 34.73 kg/m  Wt Readings from Last 3 Encounters:  05/22/20 249 lb (112.9 kg)  05/14/20 248 lb 6.4 oz (112.7 kg)  02/06/20 249 lb (112.9 kg)    Health Maintenance Due  Topic Date Due  . Hepatitis C Screening  Never done  . INFLUENZA VACCINE  03/29/2020    There are no preventive care reminders to display for this patient.   Lab Results  Component Value Date   TSH 1.660 05/14/2020   Lab Results  Component Value Date   WBC 5.2 02/06/2020   HGB 14.8 02/06/2020   HCT 43.1 02/06/2020   MCV 90 02/06/2020   PLT 207 02/06/2020   Lab Results  Component Value Date   NA 140 05/14/2020   K 3.6 05/14/2020   CO2 25 05/14/2020   GLUCOSE 101 (H) 05/14/2020   BUN 13 05/14/2020   CREATININE 1.16 05/14/2020   BILITOT 0.8 05/14/2020   ALKPHOS 106 05/14/2020   AST 21 05/14/2020   ALT 22 05/14/2020   PROT 6.9 05/14/2020   ALBUMIN 4.4 05/14/2020   CALCIUM 9.1 05/14/2020   ANIONGAP 8 01/18/2018   Lab Results  Component Value Date   CHOL 202 (H) 02/06/2020   Lab Results  Component Value Date   HDL 35 (L) 02/06/2020   Lab Results  Component Value Date   LDLCALC 143 (H) 02/06/2020   Lab Results  Component Value Date   TRIG 131 02/06/2020   Lab Results  Component Value Date   CHOLHDL 5.8 (H) 02/06/2020   No results found for: HGBA1C     Assessment & Plan:  1. Mixed hyperlipidemia Poorly controlled.  No changes to medicines.    Continue to work on eating a healthy diet and exercise.  Labs drawn today.  - Lipid panel  2. Hypertensive heart disease with chronic diastolic congestive heart failure ([place]) Well controlled.  No changes to medicines.  Continue to work on eating a healthy diet and exercise.  Labs drawn today.  - cbc - cmp  3. Hereditary thrombophilia ([place]) Due to eliquis.  4. Chronic atrial fibrillation (H[facility]) The current medical regimen is effective;  continue present plan and medications.  5. Hypothyroidism (acquired) The current medical regimen is effective;  continue present plan and medications.  6. [place]ela of cardioembolic stroke Vision is affected.    7. Obesity wth bmi 24.  Recommend continue to work on eating healthy diet and exercise.  No orders of the defined types were placed in this encounter.    Follow-up: No follow-ups on file.  An After Visit Summary was printed and given to the patient.  [name] [facility] [phone]

## 2020-05-23 LAB — CBC WITH DIFFERENTIAL/PLATELET
Basophils Absolute: 0.1 10*3/uL (ref 0.0–0.2)
Basos: 1 %
EOS (ABSOLUTE): 0.2 10*3/uL (ref 0.0–0.4)
Eos: 4 %
Hematocrit: 45.3 % (ref 37.5–51.0)
Hemoglobin: 15.5 g/dL (ref 13.0–17.7)
Immature Grans (Abs): 0 10*3/uL (ref 0.0–0.1)
Immature Granulocytes: 0 %
Lymphocytes Absolute: 1.9 10*3/uL (ref 0.7–3.1)
Lymphs: 29 %
MCH: 31.4 pg (ref 26.6–33.0)
MCHC: 34.2 g/dL (ref 31.5–35.7)
MCV: 92 fL (ref 79–97)
Monocytes Absolute: 1.1 10*3/uL — ABNORMAL HIGH (ref 0.1–0.9)
Monocytes: 16 %
Neutrophils Absolute: 3.3 10*3/uL (ref 1.4–7.0)
Neutrophils: 50 %
Platelets: 210 10*3/uL (ref 150–450)
RBC: 4.93 x10E6/uL (ref 4.14–5.80)
RDW: 12.8 % (ref 11.6–15.4)
WBC: 6.5 10*3/uL (ref 3.4–10.8)

## 2020-05-23 LAB — COMPREHENSIVE METABOLIC PANEL WITH GFR
ALT: 20 IU/L (ref 0–44)
AST: 19 IU/L (ref 0–40)
Albumin/Globulin Ratio: 1.9 (ref 1.2–2.2)
Albumin: 4.7 g/dL (ref 3.7–4.7)
Alkaline Phosphatase: 112 IU/L (ref 44–121)
BUN/Creatinine Ratio: 13 (ref 10–24)
BUN: 16 mg/dL (ref 8–27)
Bilirubin Total: 1 mg/dL (ref 0.0–1.2)
CO2: 26 mmol/L (ref 20–29)
Calcium: 9.3 mg/dL (ref 8.6–10.2)
Chloride: 99 mmol/L (ref 96–106)
Creatinine, Ser: 1.28 mg/dL — ABNORMAL HIGH (ref 0.76–1.27)
GFR calc Af Amer: 64 mL/min/1.73
GFR calc non Af Amer: 56 mL/min/1.73 — ABNORMAL LOW
Globulin, Total: 2.5 g/dL (ref 1.5–4.5)
Glucose: 91 mg/dL (ref 65–99)
Potassium: 3.7 mmol/L (ref 3.5–5.2)
Sodium: 138 mmol/L (ref 134–144)
Total Protein: 7.2 g/dL (ref 6.0–8.5)

## 2020-05-23 LAB — HEMOGLOBIN A1C
Est. average glucose Bld gHb Est-mCnc: 105 mg/dL
Hgb A1c MFr Bld: 5.3 % (ref 4.8–5.6)

## 2020-05-23 LAB — LIPID PANEL
Chol/HDL Ratio: 3.7 ratio (ref 0.0–5.0)
Cholesterol, Total: 157 mg/dL (ref 100–199)
HDL: 42 mg/dL (ref >39)
LDL Chol Calc (NIH): 95 mg/dL (ref 0–99)
Triglycerides: 110 mg/dL (ref 0–149)
VLDL Cholesterol Cal: 20 mg/dL (ref 5–40)

## 2020-05-23 LAB — CARDIOVASCULAR RISK ASSESSMENT

## 2020-06-02 ENCOUNTER — Other Ambulatory Visit: Payer: Self-pay | Admitting: Physician Assistant

## 2020-06-30 ENCOUNTER — Ambulatory Visit (INDEPENDENT_AMBULATORY_CARE_PROVIDER_SITE_OTHER): Payer: Medicare Other

## 2020-06-30 DIAGNOSIS — Z23 Encounter for immunization: Secondary | ICD-10-CM | POA: Diagnosis not present

## 2020-06-30 NOTE — Progress Notes (Signed)
   Covid-19 Vaccination Clinic  Name:  Maxson Oddo    MRN: [id number] DOB: 05-Jan-1946  06/30/2020  Mr. Jden Want was observed post Covid-19 immunization for 15 minutes without incident. He was provided with Vaccine Information Sheet and instruction to access the V-Safe system.   Mr. Gerren Hoffmeier was instructed to call 911 with any severe reactions post vaccine: Marland Kitchen Difficulty breathing  . Swelling of face and throat  . A fast heartbeat  . A bad rash all over body  . Dizziness and weakness

## 2020-07-14 ENCOUNTER — Other Ambulatory Visit: Payer: Self-pay | Admitting: Family Medicine

## 2020-07-21 ENCOUNTER — Ambulatory Visit (INDEPENDENT_AMBULATORY_CARE_PROVIDER_SITE_OTHER): Payer: Medicare Other | Admitting: Family Medicine

## 2020-07-21 ENCOUNTER — Other Ambulatory Visit: Payer: Self-pay

## 2020-07-21 ENCOUNTER — Encounter: Payer: Self-pay | Admitting: Family Medicine

## 2020-07-21 VITALS — BP 140/88 | HR 58 | Temp 97.4°F | Ht 71.0 in | Wt 249.0 lb

## 2020-07-21 DIAGNOSIS — Z0000 Encounter for general adult medical examination without abnormal findings: Secondary | ICD-10-CM | POA: Diagnosis not present

## 2020-07-21 DIAGNOSIS — Z6834 Body mass index (BMI) 34.0-34.9, adult: Secondary | ICD-10-CM | POA: Diagnosis not present

## 2020-07-21 DIAGNOSIS — E6609 Other obesity due to excess calories: Secondary | ICD-10-CM | POA: Diagnosis not present

## 2020-07-21 NOTE — Progress Notes (Signed)
Subjective:  Patient ID: John Kemp, male    DOB: September 10, 1945  Age: 74 y.o. MRN: [id number]  Chief Complaint  Patient presents with   Annual Exam   HPI  Well Adult Physical: Patient here for a comprehensive physical exam.The patient reports problems - none Do you take any herbs or supplements that were not prescribed by a doctor? yes Are you taking calcium supplements? no Are you taking aspirin daily? noEncounter for general adult medical examination without abnormal findings  Physical ("At Risk" items are starred): Patient's last physical exam was 1 year ago .  Smoking: former smoker ;  Diet: Overeats. Fried foods, sweets. Has had fruits/vegetables. Occasionally drinks a diet drink.  Physical Activity: does not exercise at least 3 times per week ;  Alcohol/Drug Use: Is a non-drinker ; No illicit drug use ;  Patient is not afflicted from Stress Incontinence and Urge Incontinence  Safety: reviewed ; Patient wears a seat belt, has smoke detectors, has carbon monoxide detectors, practices appropriate gun safety, and wears sunscreen with extended sun exposure. Dental Care: annual cleanings, brushes daily- has implants Ophthalmology/Optometry: Annual visit.  Hearing loss: none Vision impairments: cataracts removed and has 20/20. Last PSA: 0.6 on 04/18/2018    Office Visit from 07/21/2020 in [place]  PHQ-2 Total Score 0       Social History   Socioeconomic History   Marital status: Married    Spouse name: Not on file   Number of children: Not on file   Years of education: Not on file   Highest education level: Not on file  Occupational History   Occupation: Retired  Tobacco Use   Smoking status: Former Smoker    Packs/day: 1.00    Years: 15.00    Pack years: 15.00    Types: Cigarettes    Quit date: 08/29/1978    Years since quitting: 41.9   Smokeless tobacco: Never Used  Vaping Use   Vaping Use: Never used  Substance and Sexual Activity   Alcohol  use: No    Alcohol/week: 0.0 standard drinks   Drug use: No   Sexual activity: Not Currently  Other Topics Concern   Not on file  Social History Narrative   Not on file   Social Determinants of Health   Financial Resource Strain:    Difficulty of Paying Living Expenses: Not on file  Food Insecurity:    Worried About Charity fundraiser in the Last Year: Not on file   [employer] of Food in the Last Year: Not on file  Transportation Needs:    Lack of Transportation (Medical): Not on file   Lack of Transportation (Non-Medical): Not on file  Physical Activity:    Days of Exercise per Week: Not on file   Minutes of Exercise per Session: Not on file  Stress:    Feeling of Stress : Not on file  Social Connections:    Frequency of Communication with Friends and Family: Not on file   Frequency of Social Gatherings with Friends and Family: Not on file   Attends Religious Services: Not on file   Active Member of Clubs or Organizations: Not on file   Attends Archivist Meetings: Not on file   Marital Status: Not on file   Past Medical History:  Diagnosis Date   A-fib [facility])    Anticoagulated 01/21/2015   Atherosclerotic heart disease of native coronary artery without angina pectoris    Blepharitis of left lower  eyelid 12/02/2019   CAD in native artery 01/21/2015   Cancer [facility])    skin cancer   Chest pain 04/02/2017   CHF (congestive heart failure) ([place]) 11/05/2019   Chronic diastolic (congestive) heart failure (HCC)    Chronic diastolic heart failure ([place]) 01/21/2015   Last Assessment & Plan:  Is congestive heart failure is mildly decompensated he is edematous he'll increase the dose of his diuretic today the lab work performed including an ARB referred to care transitions for home care he'll follow-up in my office with me in 4-6 weeks daily weights sodium restrictionand compliance of medications beinghemoglobin of will   Chronic obstructive pulmonary  disease (COPD) ([place])    Dyspnea 12/31/2014   Followed in Pulmonary clinic/ [facility]/ [place]  - amiodarone rx 11/2014 >>> - PFTs rec 12/31/2014     Heart attack ([place])    High cholesterol 10/20/2017   Hx of CABG 03/02/2017   Hyperlipidemia    Hypertension    Hypertensive heart disease 01/21/2015   Hypertensive heart disease with heart failure (HCC)    Hypothyroidism    Hypothyroidism (acquired) 04/24/2018   Impaired fasting glucose    Leg fatigue 03/03/2017   Obesity 04/02/2017   On amiodarone therapy 01/21/2015   PAF (paroxysmal atrial fibrillation) ([place])    Primary insomnia    Squamous cell carcinoma 11/05/2019   Stroke (cerebrum) ([place]) 09/2016   right superior quadrantanopsia   Stroke ([place])    Typical atrial flutter ([place]) 01/21/2015   Vitamin D deficiency, unspecified    Past Surgical History:  Procedure Laterality Date   APPENDECTOMY  1956   CARDIAC CATHETERIZATION     CARDIOVERSION  2002   CHOLECYSTECTOMY  2006   CORONARY ARTERY BYPASS GRAFT  2001   HERNIA REPAIR     RADIOLOGY WITH ANESTHESIA Bilateral 01/18/2018   Procedure: MRI WITH ANESTHESIA LUMBAR SPINE WITH AND WITHOUT CONTRAST;  Surgeon: Radiologist, Medication, MD;  Location: [place];  Service: Radiology;  Laterality: Bilateral;   ROTATOR CUFF REPAIR Left    TONSILLECTOMY  1975    Family History  Problem Relation Age of Onset   Heart disease Father    Arrhythmia Father    Breast cancer Mother    Diabetes type II Sister    Social History   Socioeconomic History   Marital status: Married    Spouse name: Not on file   Number of children: Not on file   Years of education: Not on file   Highest education level: Not on file  Occupational History   Occupation: Retired  Tobacco Use   Smoking status: Former Smoker    Packs/day: 1.00    Years: 15.00    Pack years: 15.00    Types: Cigarettes    Quit date: 08/29/1978    Years since quitting: 41.9   Smokeless tobacco: Never Used    Vaping Use   Vaping Use: Never used  Substance and Sexual Activity   Alcohol use: No    Alcohol/week: 0.0 standard drinks   Drug use: No   Sexual activity: Not Currently  Other Topics Concern   Not on file  Social History Narrative   Not on file   Social Determinants of Health   Financial Resource Strain:    Difficulty of Paying Living Expenses: Not on file  Food Insecurity:    Worried About Running Out of Food in the Last Year: Not on file   [employer] of Food in the Last Year: Not on file  Transportation Needs:    Film/video editor (Medical): Not on file   Lack of Transportation (Non-Medical): Not on file  Physical Activity:    Days of Exercise per Week: Not on file   Minutes of Exercise per Session: Not on file  Stress:    Feeling of Stress : Not on file  Social Connections:    Frequency of Communication with Friends and Family: Not on file   Frequency of Social Gatherings with Friends and Family: Not on file   Attends Religious Services: Not on file   Active Member of Clubs or Organizations: Not on file   Attends Archivist Meetings: Not on file   Marital Status: Not on file   Review of Systems  Constitutional: Negative for chills, diaphoresis, fatigue and fever.  HENT: Negative for congestion, ear pain and sore throat.   Respiratory: Negative for cough and shortness of breath.   Cardiovascular: Negative for chest pain and leg swelling.  Gastrointestinal: Negative for abdominal pain, constipation, diarrhea, nausea and vomiting.  Genitourinary: Negative for dysuria and urgency.  Musculoskeletal: Negative for arthralgias and myalgias.  Neurological: Negative for dizziness and headaches.  Psychiatric/Behavioral: Negative for dysphoric mood.     Objective:  BP 140/88    Pulse (!) 58    Temp (!) 97.4 F (36.3 C)    Ht 5\' 11"  (1.803 m)    Wt 249 lb (112.9 kg)    SpO2 99%    BMI 34.73 kg/m   BP/Weight 07/21/2020 05/22/2020 7/62/8315   Systolic BP 176 160 737  Diastolic BP 88 68 78  Wt. (Lbs) 249 249 248.4  BMI 34.73 34.73 35.14    Physical Exam Vitals reviewed.  Constitutional:      Appearance: Normal appearance.  Cardiovascular:     Rate and Rhythm: Normal rate and regular rhythm.  Pulmonary:     Effort: Pulmonary effort is normal.     Breath sounds: Normal breath sounds.  Musculoskeletal:     Cervical back: Normal range of motion.  Neurological:     Mental Status: He is alert.  Psychiatric:        Mood and Affect: Mood normal.        Behavior: Behavior normal.     Lab Results  Component Value Date   WBC 6.5 05/22/2020   HGB 15.5 05/22/2020   HCT 45.3 05/22/2020   PLT 210 05/22/2020   GLUCOSE 91 05/22/2020   CHOL 157 05/22/2020   TRIG 110 05/22/2020   HDL 42 05/22/2020   LDLCALC 95 05/22/2020   ALT 20 05/22/2020   AST 19 05/22/2020   NA 138 05/22/2020   K 3.7 05/22/2020   CL 99 05/22/2020   CREATININE 1.28 (H) 05/22/2020   BUN 16 05/22/2020   CO2 26 05/22/2020   TSH 1.660 05/14/2020   HGBA1C 5.3 05/22/2020   MICROALBUR Positive 150 01/23/2019      Assessment & Plan:  1. Encounter for Medicare annual wellness exam  2. Class 1 obesity due to excess calories with serious comorbidity and body mass index (BMI) of 34.0 to 34.9 in adult Recommend DASH diet. Recommend exercise.  Recommend needs to make Living well and HCPOA. Make small goal and gradually increase.  Recommend get second shingrix vaccine.   2. Class 1 obesity due to excess calories with serious comorbidity and body mass index (BMI) of 34.0 to 34.9 in adult See above.   Body mass index is 34.73 kg/m.   This is a list of  the screening recommended for you and due dates:  Health Maintenance  Topic Date Due    Hepatitis C: One time screening is recommended by Center for Disease Control  (CDC) for  adults born from [birth year] through 1963.   Never done   Tetanus Vaccine  04/30/2024   Colon Cancer Screening  11/05/2028   Flu  Shot  Completed   COVID-19 Vaccine  Completed   Pneumonia vaccines  Completed     AN INDIVIDUALIZED CARE PLAN: was established or reinforced today.   SELF MANAGEMENT: The patient and I together assessed ways to personally work towards obtaining the recommended goals  Support needs The patient and/or family needs were assessed and services were offered if appropriate.  Follow-up: Return in about 1 year (around 07/21/2021) for AWV.  An After Visit Summary was printed and given to the patient.  [name], MD [facility] [phone]

## 2020-07-21 NOTE — Patient Instructions (Signed)
Preventive Care 72 Years and Older, Male Preventive care refers to lifestyle choices and visits with your health care provider that can promote health and wellness. This includes:  A yearly physical exam. This is also called an annual well check.  Regular dental and eye exams.  Immunizations.  Screening for certain conditions.  Healthy lifestyle choices, such as diet and exercise. What can I expect for my preventive care visit? Physical exam Your health care provider will check:  Height and weight. These may be used to calculate body mass index (BMI), which is a measurement that tells if you are at a healthy weight.  Heart rate and blood pressure.  Your skin for abnormal spots. Counseling Your health care provider may ask you questions about:  Alcohol, tobacco, and drug use.  Emotional well-being.  Home and relationship well-being.  Sexual activity.  Eating habits.  History of falls.  Memory and ability to understand (cognition).  Work and work Statistician. What immunizations do I need?  Influenza (flu) vaccine  This is recommended every year. Tetanus, diphtheria, and pertussis (Tdap) vaccine  You may need a Td booster every 10 years. Varicella (chickenpox) vaccine  You may need this vaccine if you have not already been vaccinated. Zoster (shingles) vaccine  You may need this after age 70. Pneumococcal conjugate (PCV13) vaccine  One dose is recommended after age 40. Pneumococcal polysaccharide (PPSV23) vaccine  One dose is recommended after age 24. Measles, mumps, and rubella (MMR) vaccine  You may need at least one dose of MMR if you were born in 1957 or later. You may also need a second dose. Meningococcal conjugate (MenACWY) vaccine  You may need this if you have certain conditions. Hepatitis A vaccine  You may need this if you have certain conditions or if you travel or work in places where you may be exposed to hepatitis A. Hepatitis B  vaccine  You may need this if you have certain conditions or if you travel or work in places where you may be exposed to hepatitis B. Haemophilus influenzae type b (Hib) vaccine  You may need this if you have certain conditions. You may receive vaccines as individual doses or as more than one vaccine together in one shot (combination vaccines). Talk with your health care provider about the risks and benefits of combination vaccines. What tests do I need? Blood tests  Lipid and cholesterol levels. These may be checked every 5 years, or more frequently depending on your overall health.  Hepatitis C test.  Hepatitis B test. Screening  Lung cancer screening. You may have this screening every year starting at age 67 if you have a 30-pack-year history of smoking and currently smoke or have quit within the past 15 years.  Colorectal cancer screening. All adults should have this screening starting at age 77 and continuing until age 8. Your health care provider may recommend screening at age 74 if you are at increased risk. You will have tests every 1-10 years, depending on your results and the type of screening test.  Prostate cancer screening. Recommendations will vary depending on your family history and other risks.  Diabetes screening. This is done by checking your blood sugar (glucose) after you have not eaten for a while (fasting). You may have this done every 1-3 years.  Abdominal aortic aneurysm (AAA) screening. You may need this if you are a current or former smoker.  Sexually transmitted disease (STD) testing. Follow these instructions at home: Eating and drinking  Eat  a diet that includes fresh fruits and vegetables, whole grains, lean protein, and low-fat dairy products. Limit your intake of foods with high amounts of sugar, saturated fats, and salt.  Take vitamin and mineral supplements as recommended by your health care provider.  Do not drink alcohol if your health care  provider tells you not to drink.  If you drink alcohol: ? Limit how much you have to 0-2 drinks a day. ? Be aware of how much alcohol is in your drink. In the [place] one drink equals one 12 oz bottle of beer (355 mL), one 5 oz glass of wine (148 mL), or one 1 oz glass of hard liquor (44 mL). Lifestyle  Take daily care of your teeth and gums.  Stay active. Exercise for at least 30 minutes on 5 or more days each week.  Do not use any products that contain nicotine or tobacco, such as cigarettes, e-cigarettes, and chewing tobacco. If you need help quitting, ask your health care provider.  If you are sexually active, practice safe sex. Use a condom or other form of protection to prevent STIs (sexually transmitted infections).  Talk with your health care provider about taking a low-dose aspirin or statin. What's next?  Visit your health care provider once a year for a well check visit.  Ask your health care provider how often you should have your eyes and teeth checked.  Stay up to date on all vaccines. This information is not intended to replace advice given to you by your health care provider. Make sure you discuss any questions you have with your health care provider. Document Revised: 08/09/2018 Document Reviewed: 08/09/2018 Elsevier Patient Education  [place] DASH stands for "Dietary Approaches to Stop Hypertension." The DASH eating plan is a healthy eating plan that has been shown to reduce high blood pressure (hypertension). It may also reduce your risk for type 2 diabetes, heart disease, and stroke. The DASH eating plan may also help with weight loss. What are tips for following this plan?  General guidelines  Avoid eating more than 2,300 mg (milligrams) of salt (sodium) a day. If you have hypertension, you may need to reduce your sodium intake to 1,500 mg a day.  Limit alcohol intake to no more than 1 drink a day for nonpregnant women and 2  drinks a day for men. One drink equals 12 oz of beer, 5 oz of wine, or 1 oz of hard liquor.  Work with your health care provider to maintain a healthy body weight or to lose weight. Ask what an ideal weight is for you.  Get at least 30 minutes of exercise that causes your heart to beat faster (aerobic exercise) most days of the week. Activities may include walking, swimming, or biking.  Work with your health care provider or diet and nutrition specialist (dietitian) to adjust your eating plan to your individual calorie needs. Reading food labels   Check food labels for the amount of sodium per serving. Choose foods with less than 5 percent of the Daily Value of sodium. Generally, foods with less than 300 mg of sodium per serving fit into this eating plan.  To find whole grains, look for the word "whole" as the first word in the ingredient list. Shopping  Buy products labeled as "low-sodium" or "no salt added."  Buy fresh foods. Avoid canned foods and premade or frozen meals. Cooking  Avoid adding salt when cooking. Use salt-free seasonings or  herbs instead of table salt or sea salt. Check with your health care provider or pharmacist before using salt substitutes.  Do not fry foods. Cook foods using healthy methods such as baking, boiling, grilling, and broiling instead.  Cook with heart-healthy oils, such as olive, canola, soybean, or sunflower oil. Meal planning  Eat a balanced diet that includes: ? 5 or more servings of fruits and vegetables each day. At each meal, try to fill half of your plate with fruits and vegetables. ? Up to 6-8 servings of whole grains each day. ? Less than 6 oz of lean meat, poultry, or fish each day. A 3-oz serving of meat is about the same size as a deck of cards. One egg equals 1 oz. ? 2 servings of low-fat dairy each day. ? A serving of nuts, seeds, or beans 5 times each week. ? Heart-healthy fats. Healthy fats called Omega-3 fatty acids are found in  foods such as flaxseeds and coldwater fish, like sardines, salmon, and mackerel.  Limit how much you eat of the following: ? Canned or prepackaged foods. ? Food that is high in trans fat, such as fried foods. ? Food that is high in saturated fat, such as fatty meat. ? Sweets, desserts, sugary drinks, and other foods with added sugar. ? Full-fat dairy products.  Do not salt foods before eating.  Try to eat at least 2 vegetarian meals each week.  Eat more home-cooked food and less restaurant, buffet, and fast food.  When eating at a restaurant, ask that your food be prepared with less salt or no salt, if possible. What foods are recommended? The items listed may not be a complete list. Talk with your dietitian about what dietary choices are best for you. Grains Whole-grain or whole-wheat bread. Whole-grain or whole-wheat pasta. Brown rice. [place] Morrow. Bulgur. Whole-grain and low-sodium cereals. Pita bread. Low-fat, low-sodium crackers. Whole-wheat flour tortillas. Vegetables Fresh or frozen vegetables (raw, steamed, roasted, or grilled). Low-sodium or reduced-sodium tomato and vegetable juice. Low-sodium or reduced-sodium tomato sauce and tomato paste. Low-sodium or reduced-sodium canned vegetables. Fruits All fresh, dried, or frozen fruit. Canned fruit in natural juice (without added sugar). Meat and other protein foods Skinless chicken or [place]. Ground chicken or [place]. Pork with fat trimmed off. Fish and seafood. Egg whites. Dried beans, peas, or lentils. Unsalted nuts, nut butters, and seeds. Unsalted canned beans. Lean cuts of beef with fat trimmed off. Low-sodium, lean deli meat. Dairy Low-fat (1%) or fat-free (skim) milk. Fat-free, low-fat, or reduced-fat cheeses. Nonfat, low-sodium ricotta or cottage cheese. Low-fat or nonfat yogurt. Low-fat, low-sodium cheese. Fats and oils Soft margarine without trans fats. Vegetable oil. Low-fat, reduced-fat, or light mayonnaise and salad  dressings (reduced-sodium). Canola, safflower, olive, soybean, and sunflower oils. Avocado. Seasoning and other foods Herbs. Spices. Seasoning mixes without salt. Unsalted popcorn and pretzels. Fat-free sweets. What foods are not recommended? The items listed may not be a complete list. Talk with your dietitian about what dietary choices are best for you. Grains Baked goods made with fat, such as croissants, muffins, or some breads. Dry pasta or rice meal packs. Vegetables Creamed or fried vegetables. Vegetables in a cheese sauce. Regular canned vegetables (not low-sodium or reduced-sodium). Regular canned tomato sauce and paste (not low-sodium or reduced-sodium). Regular tomato and vegetable juice (not low-sodium or reduced-sodium). Angie Fava. Olives. Fruits Canned fruit in a light or heavy syrup. Fried fruit. Fruit in cream or butter sauce. Meat and other protein foods Fatty cuts of meat. Ribs.  Fried meat. Tomasa Blase. Sausage. Bologna and other processed lunch meats. Salami. Fatback. Hotdogs. Bratwurst. Salted nuts and seeds. Canned beans with added salt. Canned or smoked fish. Whole eggs or egg yolks. Chicken or [place] with skin. Dairy Whole or 2% milk, cream, and half-and-half. Whole or full-fat cream cheese. Whole-fat or sweetened yogurt. Full-fat cheese. Nondairy creamers. Whipped toppings. Processed cheese and cheese spreads. Fats and oils Butter. Stick margarine. Lard. Shortening. Ghee. Bacon fat. Tropical oils, such as coconut, palm kernel, or palm oil. Seasoning and other foods Salted popcorn and pretzels. Onion salt, garlic salt, seasoned salt, table salt, and sea salt. Worcestershire sauce. Tartar sauce. Barbecue sauce. Teriyaki sauce. Soy sauce, including reduced-sodium. Steak sauce. Canned and packaged gravies. Fish sauce. Oyster sauce. Cocktail sauce. Horseradish that you find on the shelf. Ketchup. Mustard. Meat flavorings and tenderizers. Bouillon cubes. Hot sauce and Tabasco sauce.  Premade or packaged marinades. Premade or packaged taco seasonings. Relishes. Regular salad dressings. Where to find more information:  National Heart, Lung, and Blood Institute: PopSteam.is  American Heart Association: www.heart.org Summary  The DASH eating plan is a healthy eating plan that has been shown to reduce high blood pressure (hypertension). It may also reduce your risk for type 2 diabetes, heart disease, and stroke.  With the DASH eating plan, you should limit salt (sodium) intake to 2,300 mg a day. If you have hypertension, you may need to reduce your sodium intake to 1,500 mg a day.  When on the DASH eating plan, aim to eat more fresh fruits and vegetables, whole grains, lean proteins, low-fat dairy, and heart-healthy fats.  Work with your health care provider or diet and nutrition specialist (dietitian) to adjust your eating plan to your individual calorie needs. This information is not intended to replace advice given to you by your health care provider. Make sure you discuss any questions you have with your health care provider. Document Revised: 07/28/2017 Document Reviewed: 08/08/2016 Elsevier Patient Education  2020 Elsevier Inc.   Zoster Vaccine, Recombinant injection What is this medicine? ZOSTER VACCINE (ZOS ter vak SEEN) is used to prevent shingles in adults 72 years old and over. This vaccine is not used to treat shingles or nerve pain from shingles. This medicine may be used for other purposes; ask your health care provider or pharmacist if you have questions. COMMON BRAND NAME(S): [facility] What should I tell my health care provider before I take this medicine? They need to know if you have any of these conditions:  blood disorders or disease  cancer like leukemia or lymphoma  immune system problems or therapy  an unusual or allergic reaction to vaccines, other medications, foods, dyes, or preservatives  pregnant or trying to get  pregnant  breast-feeding How should I use this medicine? This vaccine is for injection in a muscle. It is given by a health care professional. Talk to your pediatrician regarding the use of this medicine in children. This medicine is not approved for use in children. Overdosage: If you think you have taken too much of this medicine contact a poison control center or emergency room at once. NOTE: This medicine is only for you. Do not share this medicine with others. What if I miss a dose? Keep appointments for follow-up (booster) doses as directed. It is important not to miss your dose. Call your doctor or health care professional if you are unable to keep an appointment. What may interact with this medicine?  medicines that suppress your immune system  medicines to  treat cancer  steroid medicines like prednisone or cortisone This list may not describe all possible interactions. Give your health care provider a list of all the medicines, herbs, non-prescription drugs, or dietary supplements you use. Also tell them if you smoke, drink alcohol, or use illegal drugs. Some items may interact with your medicine. What should I watch for while using this medicine? Visit your doctor for regular check ups. This vaccine, like all vaccines, may not fully protect everyone. What side effects may I notice from receiving this medicine? Side effects that you should report to your doctor or health care professional as soon as possible:  allergic reactions like skin rash, itching or hives, swelling of the face, lips, or tongue  breathing problems Side effects that usually do not require medical attention (report these to your doctor or health care professional if they continue or are bothersome):  chills  headache  fever  nausea, vomiting  redness, warmth, pain, swelling or itching at site where injected  tiredness This list may not describe all possible side effects. Call your doctor for  medical advice about side effects. You may report side effects to FDA at 1-800-FDA-1088. Where should I keep my medicine? This vaccine is only given in a clinic, pharmacy, doctor's office, or other health care setting and will not be stored at home. NOTE: This sheet is a summary. It may not cover all possible information. If you have questions about this medicine, talk to your doctor, pharmacist, or health care provider.  2020 Elsevier/Gold Standard (2017-03-27 13:20:30)

## 2020-08-17 ENCOUNTER — Other Ambulatory Visit: Payer: Self-pay | Admitting: Physician Assistant

## 2020-08-17 ENCOUNTER — Other Ambulatory Visit: Payer: Self-pay | Admitting: Cardiology

## 2020-08-17 ENCOUNTER — Other Ambulatory Visit: Payer: Self-pay | Admitting: Family Medicine

## 2020-08-17 ENCOUNTER — Telehealth: Payer: Self-pay

## 2020-08-17 DIAGNOSIS — I5032 Chronic diastolic (congestive) heart failure: Secondary | ICD-10-CM

## 2020-08-17 DIAGNOSIS — I110 Hypertensive heart disease with heart failure: Secondary | ICD-10-CM

## 2020-08-17 MED ORDER — FUROSEMIDE 40 MG PO TABS: ORAL_TABLET | ORAL | 3 refills | Status: DC

## 2020-08-17 NOTE — Telephone Encounter (Signed)
Refill sent to pharmacy.   

## 2020-08-17 NOTE — Telephone Encounter (Signed)
Rx refill sent to pharmacy. 

## 2020-09-19 NOTE — Progress Notes (Signed)
Subjective:  Patient ID: John Kemp, male    DOB: [birth year]  Age: 75 y.o. MRN: [id number]  Chief Complaint  Patient presents with  . Hyperlipidemia  . Hypothyroidism    HPI  Hypertensive heart failure, Atrial fibrillation, CAD: valsartan 320 mg daily, carvidilol 3.125 mg one twice a day, lasix 40 mg once daily, amlodipine 5 mg once daily, eliquis 5 mg twice daily, amiodarone 200 mg once daily. Pt sees Dr. Bettina Gavia. Hyperlipidemia: Lipitor 10 mg once daily.  Rt carotid stenosis. Due for Carotid US.  Dementia: He started on aricept 10 mg once daily. Saw Dr. Leonie Man.  History of stroke: Gait disorder. No cane or walker. No falls in the last year. Still feels like the left side is a little week. Actually, feels like both legs are week. Worsens with ambulation. Has had ABIs normal.    Current Outpatient Medications on File Prior to Visit  Medication Sig Dispense Refill  . amiodarone (PACERONE) 200 MG tablet TAKE 1 TABLET BY MOUTH EVERY DAY 90 tablet 2  . amLODipine (NORVASC) 5 MG tablet TAKE 1 TABLET BY MOUTH EVERY DAY 90 tablet 1  . apixaban (ELIQUIS) 5 MG TABS tablet Take 5 mg by mouth 2 (two) times daily.    Marland Kitchen atorvastatin (LIPITOR) 10 MG tablet TAKE 1 TABLET BY MOUTH EVERYDAY AT BEDTIME 90 tablet 0  . carvedilol (COREG) 3.125 MG tablet TAKE 1 TABLET (3.125 MG TOTAL) BY MOUTH 2 (TWO) TIMES DAILY WITH A MEAL. 180 tablet 2  . donepezil (ARICEPT) 10 MG tablet Take 1 tablet (10 mg total) by mouth at bedtime. Start 1/2 tablet daily x 4 weeks and then 1 tablet daily 90 tablet 1  . furosemide (LASIX) 40 MG tablet Take 1 tablet twice weekly on Wednesday and Saturday. 24 tablet 3  . Inulin (FIBER CHOICE PO) Take 3 tablets by mouth daily.    Marland Kitchen levothyroxine (SYNTHROID) 137 MCG tablet TAKE 1 TABLET (137 MCG TOTAL) BY MOUTH DAILY BEFORE BREAKFAST. 90 tablet 0  . meloxicam (MOBIC) 7.5 MG tablet Take 1 tablet (7.5 mg total) by mouth daily. Prescribed by PCP Dr. Benjamine Mola 90 tablet 1  . potassium  chloride (K-DUR,KLOR-CON) 10 MEQ tablet Take 10 mEq by mouth daily as needed (for fluid retention.).     Marland Kitchen valsartan (DIOVAN) 320 MG tablet Take 1 tablet (320 mg total) by mouth daily. 90 tablet 3  . Vitamin D, Ergocalciferol, (DRISDOL) 1.25 MG (50000 UNIT) CAPS capsule TAKE 1 CAPSULE (50,000 UNITS TOTAL) BY MOUTH EVERY SATURDAY. MORNING. 12 capsule 0  . zolpidem (AMBIEN) 10 MG tablet Take 1 tablet (10 mg total) by mouth at bedtime. 30 tablet 3  . nitroGLYCERIN (NITROSTAT) 0.4 MG SL tablet Place 1 tablet (0.4 mg total) under the tongue every 5 (five) minutes as needed for chest pain. 25 tablet 5   No current facility-administered medications on file prior to visit.   Past Medical History:  Diagnosis Date  . A-fib ([place])   . Anticoagulated 01/21/2015  . Atherosclerotic heart disease of native coronary artery without angina pectoris   . Blepharitis of left lower eyelid 12/02/2019  . CAD in native artery 01/21/2015  . Cancer ([place])    skin cancer  . Chest pain 04/02/2017  . CHF (congestive heart failure) ([place]) 11/05/2019  . Chronic diastolic (congestive) heart failure ([place])   . Chronic diastolic heart failure ([place]) 01/21/2015   Last Assessment & Plan:  Is congestive heart failure is mildly decompensated he is edematous he'll increase  the dose of his diuretic today the lab work performed including an ARB referred to care transitions for home care he'll follow-up in my office with me in 4-6 weeks daily weights sodium restrictionand compliance of medications beinghemoglobin of will  . Chronic obstructive pulmonary disease (COPD) ([place])   . Dyspnea 12/31/2014   Followed in Pulmonary clinic/ [facility]/ [place]  - amiodarone rx 11/2014 >>> - PFTs rec 12/31/2014    . Heart attack ([place])   . High cholesterol 10/20/2017  . Hx of CABG 03/02/2017  . Hyperlipidemia   . Hypertension   . Hypertensive heart disease 01/21/2015  . Hypertensive heart disease with heart failure ([place])   . Hypothyroidism   . Hypothyroidism  (acquired) 04/24/2018  . Impaired fasting glucose   . Leg fatigue 03/03/2017  . Obesity 04/02/2017  . On amiodarone therapy 01/21/2015  . PAF (paroxysmal atrial fibrillation) ([place])   . Primary insomnia   . Squamous cell carcinoma 11/05/2019  . Stroke (cerebrum) ([place]) 09/2016   right superior quadrantanopsia  . Stroke ([place])   . Typical atrial flutter ([place]) 01/21/2015  . Vitamin D deficiency, unspecified    Past Surgical History:  Procedure Laterality Date  . APPENDECTOMY  1956  . CARDIAC CATHETERIZATION    . CARDIOVERSION  2002  . CHOLECYSTECTOMY  2006  . CORONARY ARTERY BYPASS GRAFT  2001  . HERNIA REPAIR    . RADIOLOGY WITH ANESTHESIA Bilateral 01/18/2018   Procedure: MRI WITH ANESTHESIA LUMBAR SPINE WITH AND WITHOUT CONTRAST;  Surgeon: Radiologist, Medication, MD;  Location: [facility];  Service: Radiology;  Laterality: Bilateral;  . ROTATOR CUFF REPAIR Left   . TONSILLECTOMY  1975    Family History  Problem Relation Age of Onset  . Heart disease Father   . Arrhythmia Father   . Breast cancer Mother   . Diabetes type II Sister   . Diabetes Brother    Social History   Socioeconomic History  . Marital status: Married    Spouse name: Not on file  . Number of children: Not on file  . Years of education: Not on file  . Highest education level: Not on file  Occupational History  . Occupation: Retired  Tobacco Use  . Smoking status: Former Smoker    Packs/day: 1.00    Years: 15.00    Pack years: 15.00    Types: Cigarettes    Quit date: 08/29/1978    Years since quitting: 42.0  . Smokeless tobacco: Never Used  Vaping Use  . Vaping Use: Never used  Substance and Sexual Activity  . Alcohol use: No    Alcohol/week: 0.0 standard drinks  . Drug use: No  . Sexual activity: Not Currently  Other Topics Concern  . Not on file  Social History Narrative  . Not on file   Social Determinants of Health   Financial Resource Strain: Not on file  Food Insecurity: Not on file   Transportation Needs: Not on file  Physical Activity: Not on file  Stress: Not on file  Social Connections: Not on file    Review of Systems  Constitutional: Negative for chills, fatigue and fever.  HENT: Negative for congestion, rhinorrhea and sore throat.   Respiratory: Negative for cough and shortness of breath.   Cardiovascular: Negative for chest pain, palpitations and leg swelling.  Gastrointestinal: Negative for abdominal pain, constipation, diarrhea, nausea and vomiting.  Genitourinary: Negative for dysuria and urgency.  Musculoskeletal: Negative for arthralgias, back pain and myalgias.  Neurological: Negative for  dizziness and headaches.  Psychiatric/Behavioral: Negative for dysphoric mood. The patient is not nervous/anxious.      Objective:  BP 138/68   Pulse 60   Temp 98 F (36.7 C)   Resp 18   Ht 5\' 11"  (1.803 m)   Wt 252 lb (114.3 kg)   BMI 35.15 kg/m   BP/Weight 09/21/2020 07/21/2020 05/12/7828  Systolic BP 562 130 865  Diastolic BP 68 88 68  Wt. (Lbs) 252 249 249  BMI 35.15 34.73 34.73    Physical Exam Vitals reviewed.  Constitutional:      Appearance: Normal appearance. He is obese.  Neck:     Vascular: No carotid bruit.  Cardiovascular:     Rate and Rhythm: Normal rate. Rhythm irregular.     Heart sounds: Normal heart sounds.  Pulmonary:     Effort: Pulmonary effort is normal.     Breath sounds: Normal breath sounds. No wheezing, rhonchi or rales.  Abdominal:     General: Bowel sounds are normal.     Palpations: Abdomen is soft.     Tenderness: There is no abdominal tenderness.  Neurological:     Mental Status: He is alert and oriented to person, place, and time.  Psychiatric:        Mood and Affect: Mood normal.        Behavior: Behavior normal.     Lab Results  Component Value Date   WBC 6.5 05/22/2020   HGB 15.5 05/22/2020   HCT 45.3 05/22/2020   PLT 210 05/22/2020   GLUCOSE 91 05/22/2020   CHOL 157 05/22/2020   TRIG 110  05/22/2020   HDL 42 05/22/2020   LDLCALC 95 05/22/2020   ALT 20 05/22/2020   AST 19 05/22/2020   NA 138 05/22/2020   K 3.7 05/22/2020   CL 99 05/22/2020   CREATININE 1.28 (H) 05/22/2020   BUN 16 05/22/2020   CO2 26 05/22/2020   TSH 1.660 05/14/2020   HGBA1C 5.3 05/22/2020   MICROALBUR Positive 150 01/23/2019      Assessment & Plan:   1. Mixed hyperlipidemia The current medical regimen is effective;  continue present plan and medications. Low fat diet.  - Lipid panel - CCM pharmacy monitoring  2. Hypertensive heart disease with chronic diastolic congestive heart failure ([place]) The current medical regimen is effective;  continue present plan and medications. Recommend continue to work on eating healthy diet and exercise. - Comprehensive metabolic panel - CBC with Differential/Platelet - CCM pharmacy monitoring  3. Chronic atrial fibrillation ([place]) Difficulty paying for eliquis. Samples given.  - CCM pharmacy monitoring  4. Impaired fasting glucose Last a1c was excellent.   5. Acquired thrombophilia ([place]) Continue eliquis. Due to atrial fibrillation  6. Hypothyroidism (acquired) - TSH  7. Chronic diastolic congestive heart failure ([facility])  The current medical regimen is effective;  continue present plan and medications.  8. right carotid stenosis Order carotid US BL.    Orders Placed This Encounter  Procedures  . Comprehensive metabolic panel  . Lipid panel  . CBC with Differential/Platelet  . TSH  . CCM pharmacy monitoring     Follow-up: Return in about 3 months (around 12/20/2020) for fasting.  An After Visit Summary was printed and given to the patient.  [name], MD [facility] [phone]

## 2020-09-21 ENCOUNTER — Other Ambulatory Visit: Payer: Self-pay

## 2020-09-21 ENCOUNTER — Ambulatory Visit (INDEPENDENT_AMBULATORY_CARE_PROVIDER_SITE_OTHER): Payer: Medicare Other | Admitting: Family Medicine

## 2020-09-21 ENCOUNTER — Encounter: Payer: Self-pay | Admitting: Family Medicine

## 2020-09-21 VITALS — BP 138/68 | HR 60 | Temp 98.0°F | Resp 18 | Ht 71.0 in | Wt 252.0 lb

## 2020-09-21 DIAGNOSIS — E782 Mixed hyperlipidemia: Secondary | ICD-10-CM | POA: Diagnosis not present

## 2020-09-21 DIAGNOSIS — E039 Hypothyroidism, unspecified: Secondary | ICD-10-CM | POA: Diagnosis not present

## 2020-09-21 DIAGNOSIS — I110 Hypertensive heart disease with heart failure: Secondary | ICD-10-CM

## 2020-09-21 DIAGNOSIS — I482 Chronic atrial fibrillation, unspecified: Secondary | ICD-10-CM

## 2020-09-21 DIAGNOSIS — R7301 Impaired fasting glucose: Secondary | ICD-10-CM | POA: Diagnosis not present

## 2020-09-21 DIAGNOSIS — D6869 Other thrombophilia: Secondary | ICD-10-CM | POA: Diagnosis not present

## 2020-09-21 DIAGNOSIS — I6521 Occlusion and stenosis of right carotid artery: Secondary | ICD-10-CM

## 2020-09-21 DIAGNOSIS — D688 Other specified coagulation defects: Secondary | ICD-10-CM

## 2020-09-21 DIAGNOSIS — I5032 Chronic diastolic (congestive) heart failure: Secondary | ICD-10-CM | POA: Diagnosis not present

## 2020-09-21 NOTE — Patient Instructions (Signed)
Ring worm: lotrimin cream.  Body Ringworm Body ringworm is an infection of the skin that often causes a ring-shaped rash. Body ringworm is also called tinea corporis. Body ringworm can affect any part of your skin. This condition is easily spread from person to person (is very contagious). What are the causes? This condition is caused by fungi called dermatophytes. The condition develops when these fungi grow out of control on the skin. You can get this condition if you touch a person or animal that has it. You can also get it if you share any items with an infected person or pet. These include:  Clothing, bedding, and towels.  Brushes or combs.  Gym equipment.  Any other object that has the fungus on it. What increases the risk? You are more likely to develop this condition if you:  Play sports that involve close physical contact, such as wrestling.  Sweat a lot.  Live in areas that are hot and humid.  Use public showers.  Have a weakened immune system. What are the signs or symptoms? Symptoms of this condition include:  Itchy, raised red spots and bumps.  Red scaly patches.  A ring-shaped rash. The rash may have: ? A clear center. ? Scales or red bumps at its center. ? Redness near its borders. ? Dry and scaly skin on or around it.   How is this diagnosed? This condition can usually be diagnosed with a skin exam. A skin scraping may be taken from the affected area and examined under a microscope to see if the fungus is present. How is this treated? This condition may be treated with:  An antifungal cream or ointment.  An antifungal shampoo.  Antifungal medicines. These may be prescribed if your ringworm: ? Is severe. ? Keeps coming back. ? Lasts a long time. Follow these instructions at home:  Take over-the-counter and prescription medicines only as told by your health care provider.  If you were given an antifungal cream or ointment: ? Use it as told by  your health care provider. ? Wash the infected area and dry it completely before applying the cream or ointment.  If you were given an antifungal shampoo: ? Use it as told by your health care provider. ? Leave the shampoo on your body for 3-5 minutes before rinsing.  While you have a rash: ? Wear loose clothing to stop clothes from rubbing and irritating it. ? Wash or change your bed sheets every night. ? Disinfect or throw out items that may be infected. ? Wash clothes and bed sheets in hot water. ? Wash your hands often with soap and water. If soap and water are not available, use hand sanitizer.  If your pet has the same infection, take your pet to see a veterinarian for treatment. How is this prevented?  Take a bath or shower every day and after every time you work out or play sports.  Dry your skin completely after bathing.  Wear sandals or shoes in public places and showers.  Change your clothes every day.  Wash athletic clothes after each use.  Do not share personal items with others.  Avoid touching red patches of skin on other people.  Avoid touching pets that have bald spots.  If you touch an animal that has a bald spot, wash your hands. Contact a health care provider if:  Your rash continues to spread after 7 days of treatment.  Your rash is not gone in 4 weeks.  The  area around your rash gets red, warm, tender, and swollen. Summary  Body ringworm is an infection of the skin that often causes a ring-shaped rash.  This condition is easily spread from person to person (is very contagious).  This condition may be treated with antifungal cream or ointment, antifungal shampoo, or antifungal medicines.  Take over-the-counter and prescription medicines only as told by your health care provider. This information is not intended to replace advice given to you by your health care provider. Make sure you discuss any questions you have with your health care  provider. Document Revised: 04/13/2018 Document Reviewed: 04/13/2018 Elsevier Patient Education  [place].

## 2020-09-22 ENCOUNTER — Other Ambulatory Visit: Payer: Self-pay

## 2020-09-22 LAB — COMPREHENSIVE METABOLIC PANEL
ALT: 31 IU/L (ref 0–44)
AST: 32 IU/L (ref 0–40)
Albumin/Globulin Ratio: 1.8 (ref 1.2–2.2)
Albumin: 4.4 g/dL (ref 3.7–4.7)
Alkaline Phosphatase: 97 IU/L (ref 44–121)
BUN/Creatinine Ratio: 11 (ref 10–24)
BUN: 16 mg/dL (ref 8–27)
Bilirubin Total: 0.8 mg/dL (ref 0.0–1.2)
CO2: 26 mmol/L (ref 20–29)
Calcium: 9.5 mg/dL (ref 8.6–10.2)
Chloride: 101 mmol/L (ref 96–106)
Creatinine, Ser: 1.47 mg/dL — ABNORMAL HIGH (ref 0.76–1.27)
GFR calc Af Amer: 54 mL/min/{1.73_m2} — ABNORMAL LOW (ref >59)
GFR calc non Af Amer: 47 mL/min/{1.73_m2} — ABNORMAL LOW (ref >59)
Globulin, Total: 2.4 g/dL (ref 1.5–4.5)
Glucose: 96 mg/dL (ref 65–99)
Potassium: 4.5 mmol/L (ref 3.5–5.2)
Sodium: 139 mmol/L (ref 134–144)
Total Protein: 6.8 g/dL (ref 6.0–8.5)

## 2020-09-22 LAB — LIPID PANEL
Chol/HDL Ratio: 4 ratio (ref 0.0–5.0)
Cholesterol, Total: 158 mg/dL (ref 100–199)
HDL: 40 mg/dL (ref >39)
LDL Chol Calc (NIH): 98 mg/dL (ref 0–99)
Triglycerides: 109 mg/dL (ref 0–149)
VLDL Cholesterol Cal: 20 mg/dL (ref 5–40)

## 2020-09-22 LAB — CBC WITH DIFFERENTIAL/PLATELET
Basophils Absolute: 0.1 10*3/uL (ref 0.0–0.2)
Basos: 1 %
EOS (ABSOLUTE): 0.3 10*3/uL (ref 0.0–0.4)
Eos: 4 %
Hematocrit: 44 % (ref 37.5–51.0)
Hemoglobin: 15.3 g/dL (ref 13.0–17.7)
Immature Grans (Abs): 0 10*3/uL (ref 0.0–0.1)
Immature Granulocytes: 0 %
Lymphocytes Absolute: 1.7 10*3/uL (ref 0.7–3.1)
Lymphs: 29 %
MCH: 31.3 pg (ref 26.6–33.0)
MCHC: 34.8 g/dL (ref 31.5–35.7)
MCV: 90 fL (ref 79–97)
Monocytes Absolute: 0.8 10*3/uL (ref 0.1–0.9)
Monocytes: 13 %
Neutrophils Absolute: 3.1 10*3/uL (ref 1.4–7.0)
Neutrophils: 53 %
Platelets: 195 10*3/uL (ref 150–450)
RBC: 4.89 x10E6/uL (ref 4.14–5.80)
RDW: 13 % (ref 11.6–15.4)
WBC: 6 10*3/uL (ref 3.4–10.8)

## 2020-09-22 LAB — TSH: TSH: 5.69 u[IU]/mL — ABNORMAL HIGH (ref 0.450–4.500)

## 2020-09-22 LAB — CARDIOVASCULAR RISK ASSESSMENT

## 2020-09-22 MED ORDER — LEVOTHYROXINE SODIUM 150 MCG PO TABS: 150.0000 ug | ORAL_TABLET | Freq: Every day | ORAL | 0 refills | Status: DC

## 2020-10-09 DIAGNOSIS — I771 Stricture of artery: Secondary | ICD-10-CM | POA: Diagnosis not present

## 2020-10-09 DIAGNOSIS — I6521 Occlusion and stenosis of right carotid artery: Secondary | ICD-10-CM | POA: Diagnosis not present

## 2020-10-09 DIAGNOSIS — I6523 Occlusion and stenosis of bilateral carotid arteries: Secondary | ICD-10-CM | POA: Diagnosis not present

## 2020-10-09 DIAGNOSIS — I10 Essential (primary) hypertension: Secondary | ICD-10-CM | POA: Diagnosis not present

## 2020-10-09 DIAGNOSIS — E785 Hyperlipidemia, unspecified: Secondary | ICD-10-CM | POA: Diagnosis not present

## 2020-10-19 ENCOUNTER — Telehealth: Payer: Self-pay

## 2020-10-19 NOTE — Telephone Encounter (Signed)
-----   Message from [name], MD sent at 10/18/2020  1:42 PM EST ----- Regarding: Carotid US Results called to patient.  I had recommended patient be on statin, which he is, and also plavix/aspirin.  Plavix and aspirin are not necessary as pt is currently on eliquis.  Also I see meloxicam on his list of medications. He should not be taking any nsaids if he is on eliquis.  kc

## 2020-10-19 NOTE — Telephone Encounter (Signed)
Please try to call eliquis drug rep (I do not have her name.)  Also, may ask him to call Dr. Joya Gaskins office, his cardiologist to check for samples until we get some in.

## 2020-10-19 NOTE — Telephone Encounter (Signed)
We do not have eliquis 5 mg samples.   John Kemp, [name] 10/19/20 10:38 AM

## 2020-10-19 NOTE — Telephone Encounter (Signed)
Called pt. Pt VU.   Requesting Eliquis he will run out in a few days. Says he needs assistance from [employer]. For price.   Also requesting samples, I will look and if we have some will put at front desk for pt.   Harrell Lark 10/19/20 10:33 AM

## 2020-10-20 ENCOUNTER — Other Ambulatory Visit: Payer: Self-pay

## 2020-10-20 DIAGNOSIS — Z79899 Other long term (current) drug therapy: Secondary | ICD-10-CM

## 2020-10-20 NOTE — Telephone Encounter (Signed)
Emailed our rep to request samples. Thanks!

## 2020-10-20 NOTE — Telephone Encounter (Signed)
Thank you. Kc

## 2020-10-22 ENCOUNTER — Telehealth: Payer: Self-pay | Admitting: Family Medicine

## 2020-10-22 NOTE — Chronic Care Management (AMB) (Signed)
  Chronic Care Management   Note  10/22/2020 Name: John Kemp MRN: [id number] DOB: [birth year]  John Kemp is a 75 y.o. year old male who is a primary care patient of Cox, Kirsten, MD. I reached out to [name] by phone today in response to a referral sent by John Kemp PCP, [name], MD.   John Kemp was given information about Chronic Care Management services today including:  1. CCM service includes personalized support from designated clinical staff supervised by his physician, including individualized plan of care and coordination with other care providers 2. 24/7 contact phone numbers for assistance for urgent and routine care needs. 3. Service will only be billed when office clinical staff spend 20 minutes or more in a month to coordinate care. 4. Only one practitioner may furnish and bill the service in a calendar month. 5. The patient may stop CCM services at any time (effective at the end of the month) by phone call to the office staff.   Patient agreed to services and verbal consent obtained.   Follow up plan:   [place]

## 2020-11-05 ENCOUNTER — Other Ambulatory Visit: Payer: Self-pay

## 2020-11-05 MED ORDER — APIXABAN 5 MG PO TABS: 5.0000 mg | ORAL_TABLET | Freq: Two times a day (BID) | ORAL | 2 refills | Status: DC

## 2020-11-09 ENCOUNTER — Other Ambulatory Visit: Payer: Self-pay | Admitting: Family Medicine

## 2020-11-10 ENCOUNTER — Other Ambulatory Visit: Payer: Self-pay | Admitting: Family Medicine

## 2020-11-11 ENCOUNTER — Other Ambulatory Visit: Payer: Self-pay

## 2020-11-11 MED ORDER — LEVOTHYROXINE SODIUM 150 MCG PO TABS: 150.0000 ug | ORAL_TABLET | Freq: Every day | ORAL | 0 refills | Status: DC

## 2020-11-12 ENCOUNTER — Ambulatory Visit (INDEPENDENT_AMBULATORY_CARE_PROVIDER_SITE_OTHER): Payer: Medicare Other | Admitting: Cardiology

## 2020-11-12 ENCOUNTER — Other Ambulatory Visit: Payer: Self-pay

## 2020-11-12 ENCOUNTER — Encounter: Payer: Self-pay | Admitting: Cardiology

## 2020-11-12 VITALS — BP 142/88 | HR 55 | Ht 71.0 in | Wt 256.0 lb

## 2020-11-12 DIAGNOSIS — Z7901 Long term (current) use of anticoagulants: Secondary | ICD-10-CM | POA: Diagnosis not present

## 2020-11-12 DIAGNOSIS — I2510 Atherosclerotic heart disease of native coronary artery without angina pectoris: Secondary | ICD-10-CM | POA: Diagnosis not present

## 2020-11-12 DIAGNOSIS — I483 Typical atrial flutter: Secondary | ICD-10-CM

## 2020-11-12 DIAGNOSIS — I480 Paroxysmal atrial fibrillation: Secondary | ICD-10-CM | POA: Diagnosis not present

## 2020-11-12 DIAGNOSIS — E7800 Pure hypercholesterolemia, unspecified: Secondary | ICD-10-CM | POA: Diagnosis not present

## 2020-11-12 DIAGNOSIS — I5032 Chronic diastolic (congestive) heart failure: Secondary | ICD-10-CM

## 2020-11-12 DIAGNOSIS — I110 Hypertensive heart disease with heart failure: Secondary | ICD-10-CM | POA: Diagnosis not present

## 2020-11-12 DIAGNOSIS — Z79899 Other long term (current) drug therapy: Secondary | ICD-10-CM | POA: Diagnosis not present

## 2020-11-12 NOTE — Progress Notes (Signed)
Cardiology Office Note:    Date:  11/12/2020   ID:  John Kemp, DOB 05/03/1946, MRN [id number]  PCP:  [name], MD  Cardiologist:  Shirlee More, MD    Referring MD: [name], MD    ASSESSMENT:    1. PAF (paroxysmal atrial fibrillation) ([place])   2. Typical atrial flutter ([place])   3. On amiodarone therapy   4. Anticoagulated   5. Hypertensive heart disease with chronic diastolic congestive heart failure ([place])   6. CAD in native artery   7. Pure hypercholesterolemia    PLAN:    In order of problems listed above:  1. Stable maintaining sinus rhythm he will continue his current amiodarone and anticoagulant.  TSH is normal no thyroid toxicity 2. BP at target continue treatment including loop diuretic he has no fluid overload ARB and beta-blocker along with his calcium channel blocker.  Heart failure is compensated. 3. CAD is stable no angina on current treatment continue as antithrombotic therapy high intensity statin and beta-blocker 4. Continue with current lipid-lowering LDL is at target   Next appointment: 6 months   Medication Adjustments/Labs and Tests Ordered: Current medicines are reviewed at length with the patient today.  Concerns regarding medicines are outlined above.  No orders of the defined types were placed in this encounter.  No orders of the defined types were placed in this encounter.   Chief Complaint  Patient presents with  . Follow-up  . Atrial Fibrillation    On amiodarone  . Congestive Heart Failure  . Hypertension  . Hyperlipidemia  . Coronary Artery Disease    History of Present Illness:    John Kemp is a 75 y.o. male with a hx of paroxysmal atrial fibrillation and atrial flutter maintaining sinus rhythm on amiodarone, he is anticoagulated, stable CAD, hypertensive heart disease with chronic diastolic heart failure and hyperlipidemia.  He was last seen 05/14/2020. Compliance with diet, lifestyle and medications: Yes  He  is doing well he tells me he had a carotid duplex done at [facility] I accessed the report showed the presence of bilateral less than 50% ICA stenosis and antegrade vertebral flow. He has had no clinical recurrence of atrial fibrillation no palpitation or syncope.  He tolerates amiodarone without side effects and his anticoagulant without bleeding.  In terms of heart failure he is done well without edema orthopnea shortness of breath in terms of CAD is done well with no angina. Past Medical History:  Diagnosis Date  . A-fib ([place])   . Anticoagulated 01/21/2015  . Atherosclerotic heart disease of native coronary artery without angina pectoris   . Blepharitis of left lower eyelid 12/02/2019  . CAD in native artery 01/21/2015  . Cancer ([place])    skin cancer  . Chest pain 04/02/2017  . CHF (congestive heart failure) ([place]) 11/05/2019  . Chronic diastolic (congestive) heart failure ([place])   . Chronic diastolic heart failure ([place]) 01/21/2015   Last Assessment & Plan:  Is congestive heart failure is mildly decompensated he is edematous he'll increase the dose of his diuretic today the lab work performed including an ARB referred to care transitions for home care he'll follow-up in my office with me in 4-6 weeks daily weights sodium restrictionand compliance of medications beinghemoglobin of will  . Chronic obstructive pulmonary disease (COPD) ([place])   . Dyspnea 12/31/2014   Followed in Pulmonary clinic/ [facility]/ [place]  - amiodarone rx 11/2014 >>> - PFTs rec 12/31/2014    . Heart  attack ([place])   . High cholesterol 10/20/2017  . Hx of CABG 03/02/2017  . Hyperlipidemia   . Hypertension   . Hypertensive heart disease 01/21/2015  . Hypertensive heart disease with heart failure ([place])   . Hypothyroidism   . Hypothyroidism (acquired) 04/24/2018  . Impaired fasting glucose   . Leg fatigue 03/03/2017  . Obesity 04/02/2017  . On amiodarone therapy 01/21/2015  . PAF (paroxysmal atrial fibrillation) ([place])   .  Primary insomnia   . Squamous cell carcinoma 11/05/2019  . Stroke (cerebrum) ([place]) 09/2016   right superior quadrantanopsia  . Stroke ([place])   . Typical atrial flutter ([place]) 01/21/2015  . Vitamin D deficiency, unspecified     Past Surgical History:  Procedure Laterality Date  . APPENDECTOMY  1956  . CARDIAC CATHETERIZATION    . CARDIOVERSION  2002  . CHOLECYSTECTOMY  2006  . CORONARY ARTERY BYPASS GRAFT  2001  . HERNIA REPAIR    . RADIOLOGY WITH ANESTHESIA Bilateral 01/18/2018   Procedure: MRI WITH ANESTHESIA LUMBAR SPINE WITH AND WITHOUT CONTRAST;  Surgeon: Radiologist, Medication, MD;  Location: [place];  Service: Radiology;  Laterality: Bilateral;  . ROTATOR CUFF REPAIR Left   . TONSILLECTOMY  1975    Current Medications: Current Meds  Medication Sig  . amiodarone (PACERONE) 200 MG tablet TAKE 1 TABLET BY MOUTH EVERY DAY  . amLODipine (NORVASC) 5 MG tablet TAKE 1 TABLET BY MOUTH EVERY DAY  . apixaban (ELIQUIS) 5 MG TABS tablet Take 1 tablet (5 mg total) by mouth 2 (two) times daily.  Marland Kitchen atorvastatin (LIPITOR) 10 MG tablet TAKE 1 TABLET BY MOUTH EVERYDAY AT BEDTIME  . carvedilol (COREG) 3.125 MG tablet TAKE 1 TABLET (3.125 MG TOTAL) BY MOUTH 2 (TWO) TIMES DAILY WITH A MEAL.  Marland Kitchen donepezil (ARICEPT) 10 MG tablet Take 1 tablet (10 mg total) by mouth at bedtime. Start 1/2 tablet daily x 4 weeks and then 1 tablet daily  . furosemide (LASIX) 40 MG tablet Take 1 tablet twice weekly on Wednesday and Saturday.  . Inulin (FIBER CHOICE PO) Take 3 tablets by mouth daily.  Marland Kitchen levothyroxine (SYNTHROID) 150 MCG tablet Take 1 tablet (150 mcg total) by mouth daily.  . meloxicam (MOBIC) 7.5 MG tablet Take 1 tablet (7.5 mg total) by mouth daily. Prescribed by PCP Dr. Benjamine Mola  . nitroGLYCERIN (NITROSTAT) 0.4 MG SL tablet Place 1 tablet (0.4 mg total) under the tongue every 5 (five) minutes as needed for chest pain.  . potassium chloride (K-DUR,KLOR-CON) 10 MEQ tablet Take 10 mEq by mouth daily as  needed (for fluid retention.).   Marland Kitchen valsartan (DIOVAN) 320 MG tablet Take 1 tablet (320 mg total) by mouth daily.  . Vitamin D, Ergocalciferol, (DRISDOL) 1.25 MG (50000 UNIT) CAPS capsule TAKE 1 CAPSULE (50,000 UNITS TOTAL) BY MOUTH EVERY SATURDAY. MORNING.  Marland Kitchen zolpidem (AMBIEN) 10 MG tablet TAKE ONE TABLET BY MOUTH AT BEDTIME     Allergies:   Avelox [moxifloxacin hcl], Cartia xt [diltiazem hcl], Pravachol [pravastatin sodium], Prednisone, and Zetia [ezetimibe]   Social History   Socioeconomic History  . Marital status: Married    Spouse name: Not on file  . Number of children: Not on file  . Years of education: Not on file  . Highest education level: Not on file  Occupational History  . Occupation: Retired  Tobacco Use  . Smoking status: Former Smoker    Packs/day: 1.00    Years: 15.00    Pack years: 15.00  Types: Cigarettes    Quit date: 08/29/1978    Years since quitting: 42.2  . Smokeless tobacco: Never Used  Vaping Use  . Vaping Use: Never used  Substance and Sexual Activity  . Alcohol use: No    Alcohol/week: 0.0 standard drinks  . Drug use: No  . Sexual activity: Not Currently  Other Topics Concern  . Not on file  Social History Narrative  . Not on file   Social Determinants of Health   Financial Resource Strain: Not on file  Food Insecurity: Not on file  Transportation Needs: Not on file  Physical Activity: Not on file  Stress: Not on file  Social Connections: Not on file     Family History: The patient's family history includes Arrhythmia in his father; Breast cancer in his mother; Diabetes in his brother; Diabetes type II in his sister; Heart disease in his father. ROS:   Please see the history of present illness.    All other systems reviewed and are negative.  EKGs/Labs/Other Studies Reviewed:    The following studies were reviewed today:  EKG:  EKG ordered today and personally reviewed.  The ekg ordered today demonstrates sinus rhythm left axis  deviation  Recent Labs: 05/14/2020: NT-Pro BNP 330 09/21/2020: ALT 31; BUN 16; Creatinine, Ser 1.47; Hemoglobin 15.3; Platelets 195; Potassium 4.5; Sodium 139; TSH 5.690  Recent Lipid Panel    Component Value Date/Time   CHOL 158 09/21/2020 1005   TRIG 109 09/21/2020 1005   HDL 40 09/21/2020 1005   CHOLHDL 4.0 09/21/2020 1005   CHOLHDL 5.9 04/02/2017 0213   VLDL 35 04/02/2017 0213   LDLCALC 98 09/21/2020 1005    Physical Exam:    VS:  BP (!) 142/88   Pulse (!) 55   Ht 5\' 11"  (1.803 m)   Wt 256 lb (116.1 kg)   SpO2 98%   BMI 35.70 kg/m     Wt Readings from Last 3 Encounters:  11/12/20 256 lb (116.1 kg)  09/21/20 252 lb (114.3 kg)  07/21/20 249 lb (112.9 kg)     GEN:  Well nourished, well developed in no acute distress HEENT: Normal NECK: No JVD; No carotid bruits LYMPHATICS: No lymphadenopathy CARDIAC: RRR, no murmurs, rubs, gallops RESPIRATORY:  Clear to auscultation without rales, wheezing or rhonchi  ABDOMEN: Soft, non-tender, non-distended MUSCULOSKELETAL:  No edema; No deformity  SKIN: Warm and dry NEUROLOGIC:  Alert and oriented x 3 PSYCHIATRIC:  Normal affect    Signed, Shirlee More, MD  11/12/2020 10:45 AM    [place]

## 2020-11-12 NOTE — Patient Instructions (Signed)

## 2020-12-01 NOTE — Progress Notes (Signed)
Chronic Care Management Pharmacy Note  12/07/2020 Name:  John Kemp MRN:  [id number] DOB:  [birth year]   Plan Recommendations:   Working with patient to make Eliquis more affordable. Patient provided samples today in office. Patient has not met 3% out of pocket for Eliquis at this time. Will continue to monitor out of pocket expense.   Patient has not been taking donepezil lately when refills ran out. Pharmacist [facility].   Discussed healthy diet and exercise.   Please consider increasing atorvastatin if cholesterol above goal with next blood work during 12/22/2020.   Subjective: John Kemp is an 75 y.o. year old male who is a primary patient of Cox, Kirsten, MD.  The CCM team was consulted for assistance with disease management and care coordination needs.    Engaged with patient face to face for initial visit in response to provider referral for pharmacy case management and/or care coordination services.   Consent to Services:  The patient was given the following information about Chronic Care Management services today, agreed to services, and gave verbal consent: 1. CCM service includes personalized support from designated clinical staff supervised by the primary care provider, including individualized plan of care and coordination with other care providers 2. 24/7 contact phone numbers for assistance for urgent and routine care needs. 3. Service will only be billed when office clinical staff spend 20 minutes or more in a month to coordinate care. 4. Only one practitioner may furnish and bill the service in a calendar month. 5.The patient may stop CCM services at any time (effective at the end of the month) by phone call to the office staff. 6. The patient will be responsible for cost sharing (co-pay) of up to 20% of the service fee (after annual deductible is met). Patient agreed to services and consent obtained.  Patient Care Team: [name], MD as PCP - General (Family Medicine) Burnice Logan, [facility] as Pharmacist (Pharmacist)  Recent office visits: 09/21/2020 - labs drawn. Refer to CCM. Order carotid ultrasound  07/21/2020 - medicare AWV 06/30/2020 - COVID vaccine.   Recent consult visits: 11/12/2020 - cardio - tsh is normal. No thyroid toxicity. Stable Afib. Heart failure is compensated. Stable CAD.   Hospital visits: None in previous 6 months  Objective:  Lab Results  Component Value Date   CREATININE 1.47 (H) 09/21/2020   BUN 16 09/21/2020   GFRNONAA 47 (L) 09/21/2020   GFRAA 54 (L) 09/21/2020   NA 139 09/21/2020   K 4.5 09/21/2020   CALCIUM 9.5 09/21/2020   CO2 26 09/21/2020   GLUCOSE 96 09/21/2020    Lab Results  Component Value Date/Time   HGBA1C 5.3 05/22/2020 09:07 AM   MICROALBUR Positive 150 01/23/2019 12:00 AM    Last diabetic Eye exam: No results found for: HMDIABEYEEXA  Last diabetic Foot exam: No results found for: HMDIABFOOTEX   Lab Results  Component Value Date   CHOL 158 09/21/2020   HDL 40 09/21/2020   LDLCALC 98 09/21/2020   TRIG 109 09/21/2020   CHOLHDL 4.0 09/21/2020    Hepatic Function Latest Ref Rng & Units 09/21/2020 05/22/2020 05/14/2020  Total Protein 6.0 - 8.5 g/dL 6.8 7.2 6.9  Albumin 3.7 - 4.7 g/dL 4.4 4.7 4.4  AST 0 - 40 IU/L 32 19 21  ALT 0 - 44 IU/L '31 20 22  ' Alk Phosphatase 44 - 121 IU/L 97 112 106  Total Bilirubin 0.0 - 1.2 mg/dL 0.8  1.0 0.8    Lab Results  Component Value Date/Time   TSH 5.690 (H) 09/21/2020 10:05 AM   TSH 1.660 05/14/2020 11:08 AM   FREET4 1.88 (H) 05/14/2020 11:08 AM    CBC Latest Ref Rng & Units 09/21/2020 05/22/2020 02/06/2020  WBC 3.4 - 10.8 x10E3/uL 6.0 6.5 5.2  Hemoglobin 13.0 - 17.7 g/dL 15.3 15.5 14.8  Hematocrit 37.5 - 51.0 % 44.0 45.3 43.1  Platelets 150 - 450 x10E3/uL 195 210 207    Lab Results  Component Value Date/Time   VD25OH 13.6 (L) 10/23/2017 12:35 PM    Clinical ASCVD: Yes  The ASCVD Risk score Mikey Bussing DC Jr.,  et al., 2013) failed to calculate for the following reasons:   The patient has a prior MI or stroke diagnosis    Depression screen [facility] 2/9 09/21/2020 07/21/2020 11/17/2019  Decreased Interest 0 0 0  Down, Depressed, Hopeless 0 0 0  PHQ - 2 Score 0 0 0      Social History   Tobacco Use  Smoking Status Former Smoker  . Packs/day: 1.00  . Years: 15.00  . Pack years: 15.00  . Types: Cigarettes  . Quit date: 08/29/1978  . Years since quitting: 42.3  Smokeless Tobacco Never Used   BP Readings from Last 3 Encounters:  11/12/20 (!) 142/88  09/21/20 138/68  07/21/20 140/88   Pulse Readings from Last 3 Encounters:  11/12/20 (!) 55  09/21/20 60  07/21/20 (!) 58   Wt Readings from Last 3 Encounters:  11/12/20 256 lb (116.1 kg)  09/21/20 252 lb (114.3 kg)  07/21/20 249 lb (112.9 kg)   BMI Readings from Last 3 Encounters:  11/12/20 35.70 kg/m  09/21/20 35.15 kg/m  07/21/20 34.73 kg/m    Assessment/Interventions: Review of patient past medical history, allergies, medications, health status, including review of consultants reports, laboratory and other test data, was performed as part of comprehensive evaluation and provision of chronic care management services.   SDOH:  (Social Determinants of Health) assessments and interventions performed: Yes  SDOH Screenings   Alcohol Screen: Not on file  Depression (PHQ2-9): Low Risk   . PHQ-2 Score: 0  Financial Resource Strain: Not on file  Food Insecurity: No Food Insecurity  . Worried About Charity fundraiser in the Last Year: Never true  . Ran Out of Food in the Last Year: Never true  Housing: Low Risk   . Last Housing Risk Score: 0  Physical Activity: Not on file  Social Connections: Not on file  Stress: Not on file  Tobacco Use: Medium Risk  . Smoking Tobacco Use: Former Smoker  . Smokeless Tobacco Use: Never Used  Transportation Needs: Not on file    CCM Care Plan  Allergies  Allergen Reactions  . Avelox  [Moxifloxacin Hcl]   . Cartia Xt [Diltiazem Hcl] Nausea And Vomiting  . Pravachol [Pravastatin Sodium] Other (See Comments)    myalgia  . Prednisone Other (See Comments)    "makes me feel terrible"  . Zetia [Ezetimibe] Other (See Comments)    myalgias    Medications Reviewed Today    Reviewed by Burnice Logan, [facility] (Pharmacist) on 12/07/20 at 1156  Med List Status: <None>  Medication Order Taking? Sig Documenting Provider Last Dose Status Informant  amiodarone (PACERONE) 200 MG tablet [id number] Yes TAKE 1 TABLET BY MOUTH EVERY DAY Bettina Gavia Hilton Cork, MD Taking Active   amLODipine (NORVASC) 5 MG tablet [id number] Yes TAKE 1 TABLET BY MOUTH EVERY DAY Munley,  Hilton Cork, MD Taking Active   apixaban (ELIQUIS) 5 MG TABS tablet [id number] Yes Take 1 tablet (5 mg total) by mouth 2 (two) times daily. Cox, Kirsten, MD Taking Active   atorvastatin (LIPITOR) 10 MG tablet [id number] Yes TAKE 1 TABLET BY MOUTH EVERYDAY AT BEDTIME Cox, Kirsten, MD Taking Active   carvedilol (COREG) 3.125 MG tablet [id number] Yes TAKE 1 TABLET (3.125 MG TOTAL) BY MOUTH 2 (TWO) TIMES DAILY WITH A MEAL. [name], MD Taking Active   donepezil (ARICEPT) 10 MG tablet [id number] Yes Take 1 tablet (10 mg total) by mouth at bedtime. Cox, Kirsten, MD Taking Active   furosemide (LASIX) 40 MG tablet [id number] Yes Take 1 tablet twice weekly on Wednesday and Saturday. [name], MD Taking Active   Inulin (FIBER CHOICE PO) [id number] Yes Take 3 tablets by mouth daily. [provider] Taking Active   levothyroxine (SYNTHROID) 150 MCG tablet [id number] Yes Take 1 tablet (150 mcg total) by mouth daily. Cox, Kirsten, MD Taking Active   nitroGLYCERIN (NITROSTAT) 0.4 MG SL tablet [id number]  Place 1 tablet (0.4 mg total) under the tongue every 5 (five) minutes as needed for chest pain. [name], MD  Expired 05/14/20 2359   potassium chloride (K-DUR,KLOR-CON) 10 MEQ tablet [id number] Yes Take 10 mEq by mouth daily as needed (for  fluid retention.).  [provider] Taking Active Self  valsartan (DIOVAN) 320 MG tablet [id number] Yes Take 1 tablet (320 mg total) by mouth daily. Cox, Kirsten, MD Taking Active   Vitamin D, Ergocalciferol, (DRISDOL) 1.25 MG (50000 UNIT) CAPS capsule [id number] Yes TAKE 1 CAPSULE (50,000 UNITS TOTAL) BY MOUTH EVERY SATURDAY. MORNING. Marge Duncans, PA-C Taking Active   zolpidem (AMBIEN) 10 MG tablet [id number] Yes TAKE ONE TABLET BY MOUTH AT BEDTIME [name], MD Taking Active           Patient Active Problem List   Diagnosis Date Noted  . Vitamin D deficiency, unspecified   . Primary insomnia   . Impaired fasting glucose   . Hypothyroidism   . Hypertensive heart disease with heart failure ([place])   . Hypertension   . Hyperlipidemia   . Heart attack ([place])   . Chronic obstructive pulmonary disease (COPD) ([place])   . Chronic diastolic (congestive) heart failure ([place])   . Cancer ([place])   . A-fib ([place])   . Atherosclerotic heart disease of native coronary artery without angina pectoris   . Screening for prostate cancer 02/06/2020  . Blepharitis of left lower eyelid 12/02/2019  . Hereditary thrombophilia ([place]) 11/17/2019  . Sequela of cardioembolic stroke 83/66/2947  . 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 Chronic atrial fibrillation ([place]) 11/17/2019  . Mixed hyperlipidemia 11/17/2019  . Essential hypertension, benign 11/17/2019  . CHF (congestive heart failure) ([place]) 11/05/2019  . Squamous cell carcinoma 11/05/2019  . Hypothyroidism (acquired) 04/24/2018  . High cholesterol 10/20/2017  . Class 1 obesity due to excess calories with serious comorbidity and body mass index (BMI) of 34.0 to 34.9 in adult 04/02/2017  . Chest pain 04/02/2017  . Obesity 04/02/2017  . PAF (paroxysmal atrial fibrillation) ([place])   . Stroke ([place]) 03/03/2017  . Leg fatigue 03/03/2017  . Hx of CABG 03/02/2017  . Stroke (cerebrum) ([place]) 09/2016  . Anticoagulated 01/21/2015   . CAD in native artery 01/21/2015  . Chronic diastolic heart failure ([place]) 01/21/2015  . Hypertensive heart disease 01/21/2015  . On amiodarone therapy 01/21/2015  . Typical atrial flutter ([place]) 01/21/2015  .  Dyspnea 12/31/2014    Immunization History  Administered Date(s) Administered  . Fluad Quad(high Dose 65+) 05/22/2020  . Influenza Split 05/29/2014  . Influenza-Unspecified 06/29/2018, 06/04/2019  . Moderna SARS-COV2 Booster Vaccination 06/30/2020  . Moderna Sars-Covid-2 Vaccination 10/25/2019, 11/27/2019  . Pneumococcal Conjugate-13 07/12/2017  . Pneumococcal Polysaccharide-23 07/19/2018  . Tdap 04/30/2014    Conditions to be addressed/monitored:  Hypertension, Hyperlipidemia, Diabetes, Atrial Fibrillation, Heart Failure, Coronary Artery Disease and COPD  Care Plan : [place]  Updates made by Burnice Logan, RPH since 12/07/2020 12:00 AM    Problem: htn, hld, chf, afib   Priority: High  Onset Date: 12/07/2020    Long-Range Goal: Disease Management   Start Date: 12/07/2020  Expected End Date: 12/07/2021  This Visit's Progress: On track  Priority: High  Note:     Current Barriers:  . Unable to independently afford treatment regimen  Pharmacist Clinical Goal(s):  Marland Kitchen Patient will verbalize ability to afford treatment regimen through collaboration with PharmD and provider.   Interventions: . 1:1 collaboration with [name], MD regarding development and update of comprehensive plan of care as evidenced by provider attestation and co-signature . Inter-disciplinary care team collaboration (see longitudinal plan of care) . Comprehensive medication review performed; medication list updated in electronic medical record  Hypertension (BP goal <130/80) -Controlled -Current treatment: . Amlodipine 5 mg bid . Carvedilol 3.125 mg bid with a meal . Furosemide 40 mg twice weekly . Valsartan 320 mg daily . Potassium 10 meq daily prn  -Medications previously  tried: diltiazem  -Current home readings: not checking -Current dietary habits: sandwich, cookies, home cooking -Current exercise habits: no regular exercise -Denies hypotensive/hypertensive symptoms -Educated on BP goals and benefits of medications for prevention of heart attack, stroke and kidney damage; Daily salt intake goal < 2300 mg; Exercise goal of 150 minutes per week; Importance of home blood pressure monitoring; -Counseled to monitor BP at home weekly, document, and provide log at future appointments -Counseled on diet and exercise extensively Recommended to continue current medication Recommended beginning to check bp weekly at home   Hyperlipidemia: (LDL goal < 55) -Not ideally controlled -Current treatment: . atorvastatin 10 mg daily at bedtime -Medications previously tried: none reported  -Current dietary patterns: no specific diet.  -Current exercise habits: no regular exercise -Educated on Cholesterol goals;  Benefits of statin for ASCVD risk reduction; Importance of limiting foods high in cholesterol; Exercise goal of 150 minutes per week; -Counseled on diet and exercise extensively Recommended to continue current medication  Atrial Fibrillation (Goal: prevent stroke and major bleeding) -Controlled -CHADSVASC: 5 -Current treatment: . Rate control: amiodarone 200 mg daily, carvedilol 3.25 mg bid  . Anticoagulation:  apixaban 5 mg bid -Medications previously tried: diltiazem -Home BP and HR readings: not checking currently  -Counseled on increased risk of stroke due to Afib and benefits of anticoagulation for stroke prevention; importance of adherence to anticoagulant exactly as prescribed; bleeding risk associated with Eliquis and importance of self-monitoring for signs/symptoms of bleeding; avoidance of NSAIDs due to increased bleeding risk with anticoagulants; Coordinating patient assistance application for Eliquis and samples of Eliquis. -Recommended to  continue current medication Assessed patient finances. Patient has not spent 3% of income on medication at this time. Will continue to check for Eliquis PAP applicaiton    Heart Failure (Goal: manage symptoms and prevent exacerbations) -Controlled -Last ejection fraction: 08/18 (Date: 55-60%) -HF type: Diastolic -Current treatment: . Amlodipine 5 mg daily . Carvedilol 3.125 mg bid with a  meal . Furosemide 40 mg twice weekly . Valsartan 320 mg daily . Potassium 10 meq daily prn  . Nitroglycerin 0.4 mg sl every 5 minutes prn chest pain  -Medications previously tried: diltiazem   -Current home BP/HR readings: none reported/not checking at home -Current dietary habits: denies specific diet. Eats sandwiches some and wife cooks some.  -Current exercise habits: no regular exercise -Educated on Benefits of medications for managing symptoms and prolonging life Proper diuretic administration and potassium supplementation Importance of blood pressure control -Counseled on diet and exercise extensively Recommended to continue current medication  Hypothyroid (Goal: manage TSH/Symptoms ) -Not ideally controlled at last visit - labs rechecked with 3 month follow-up -Current treatment  . levothyroxine 150 mcg daily  -Medications previously tried: none reported  -Recommended to continue current medication    Memory (Goal: manage memory) -Controlled -Current treatment  . donepezil 10 mg daily  -Medications previously tried: none reported  -Recommended to continue current medication  Health Maintenance -Vaccine gaps: recommended 4th COVID -Current therapy:  . Fiber choice daily  . Vitamin D 50,000 units every Saturday am  . Zolpidem 10 mg daily at bedtime -Patient is satisfied with current therapy and denies issues -Counseled on diet and exercise extensively Recommended to continue current medication   Patient Goals/Self-Care Activities . Patient will:  - take medications as  prescribed focus on medication adherence by considering using a pill box target a minimum of 150 minutes of moderate intensity exercise weekly engage in dietary modifications by limiting salt and focusing on heart-healthy diet.   Follow Up Plan: Telephone follow up appointment with care management team member scheduled for:05/2021      Medication Assistance: Application for Eliquis  medication assistance program. in process.  Anticipated assistance start date 12/2020.  See plan of care for additional detail.  [id number] [place] [id number]: [phone]: 3[place] [id number]: [phone]: [phone] Uses pill box? No - keeps medications together.  Pt endorses good compliance - had stopped donepezil  We discussed: Current pharmacy is preferred with insurance plan and patient is satisfied with pharmacy services Patient decided to: Continue current medication management strategy  Care Plan and Follow Up Patient Decision:  Patient agrees to Care Plan and Follow-up.  Plan: Telephone follow up appointment with care management team member scheduled for:  05/2021

## 2020-12-03 ENCOUNTER — Telehealth: Payer: Self-pay

## 2020-12-03 NOTE — Progress Notes (Signed)
Chronic Care Management Pharmacy Assistant   Name: Jader Desai  MRN: [id number] DOB: [birth year]   Reason for Encounter:  Initial questions for [name], CPP    Medications: Outpatient Encounter Medications as of 12/03/2020  Medication Sig  . amiodarone (PACERONE) 200 MG tablet TAKE 1 TABLET BY MOUTH EVERY DAY  . amLODipine (NORVASC) 5 MG tablet TAKE 1 TABLET BY MOUTH EVERY DAY  . apixaban (ELIQUIS) 5 MG TABS tablet Take 1 tablet (5 mg total) by mouth 2 (two) times daily.  Marland Kitchen atorvastatin (LIPITOR) 10 MG tablet TAKE 1 TABLET BY MOUTH EVERYDAY AT BEDTIME  . carvedilol (COREG) 3.125 MG tablet TAKE 1 TABLET (3.125 MG TOTAL) BY MOUTH 2 (TWO) TIMES DAILY WITH A MEAL.  Marland Kitchen donepezil (ARICEPT) 10 MG tablet Take 1 tablet (10 mg total) by mouth at bedtime. Start 1/2 tablet daily x 4 weeks and then 1 tablet daily  . furosemide (LASIX) 40 MG tablet Take 1 tablet twice weekly on Wednesday and Saturday.  . Inulin (FIBER CHOICE PO) Take 3 tablets by mouth daily.  Marland Kitchen levothyroxine (SYNTHROID) 150 MCG tablet Take 1 tablet (150 mcg total) by mouth daily.  . meloxicam (MOBIC) 7.5 MG tablet Take 1 tablet (7.5 mg total) by mouth daily. Prescribed by PCP Dr. Benjamine Mola  . nitroGLYCERIN (NITROSTAT) 0.4 MG SL tablet Place 1 tablet (0.4 mg total) under the tongue every 5 (five) minutes as needed for chest pain.  . potassium chloride (K-DUR,KLOR-CON) 10 MEQ tablet Take 10 mEq by mouth daily as needed (for fluid retention.).   Marland Kitchen valsartan (DIOVAN) 320 MG tablet Take 1 tablet (320 mg total) by mouth daily.  . Vitamin D, Ergocalciferol, (DRISDOL) 1.25 MG (50000 UNIT) CAPS capsule TAKE 1 CAPSULE (50,000 UNITS TOTAL) BY MOUTH EVERY SATURDAY. MORNING.  Marland Kitchen zolpidem (AMBIEN) 10 MG tablet TAKE ONE TABLET BY MOUTH AT BEDTIME   No facility-administered encounter medications on file as of 12/03/2020.    Have you seen any other providers since your last visit?  11/12/20-Dr. Munley-Cardiology-PAF-follow up  80mos 09/21/20-Dr. Cox-PCP-follow up 87mos  Any changes in your medications or health? Patient reported no changes to health or medications.  Any side effects from any medications? Patient has no noted side effects.  Do you have an symptoms or problems not managed by your medications? Patient stated that when he walks for a small amount of time his legs get tired, he has no pain or numbness.  Any concerns about your health right now? Patient just reported the issue with his legs.  Has your provider asked that you check blood pressure, blood sugar, or follow special diet at home? Patient does not check blood sugars, he occasionally check his blood pressure, he adds no additional salt to his food.   Do you get any type of exercise on a regular basis? Patient sated he does things around the house but his legs get tired quickly, so he is not as active as he would like to be.     Can you think of a goal you would like to reach for your health? Patient did not have a specific goal.  Do you have any problems getting your medications? Patient has no issue with his medications.  Is there anything that you would like to discuss during the appointment? He stated his leg issue.  Please bring medications and supplements to appointment, patient is aware to bring.   [name], CPP requested a PAP form be completed for the patient for  Eliquis.  I have completed the form and advised the patient as to what documents he needs to bring with him.  Clarita Leber, [place] Pharmacist Assistant [phone]

## 2020-12-07 ENCOUNTER — Ambulatory Visit (INDEPENDENT_AMBULATORY_CARE_PROVIDER_SITE_OTHER): Payer: Medicare Other

## 2020-12-07 ENCOUNTER — Other Ambulatory Visit: Payer: Self-pay

## 2020-12-07 DIAGNOSIS — I110 Hypertensive heart disease with heart failure: Secondary | ICD-10-CM

## 2020-12-07 DIAGNOSIS — I5032 Chronic diastolic (congestive) heart failure: Secondary | ICD-10-CM

## 2020-12-07 DIAGNOSIS — E782 Mixed hyperlipidemia: Secondary | ICD-10-CM

## 2020-12-07 DIAGNOSIS — I482 Chronic atrial fibrillation, unspecified: Secondary | ICD-10-CM | POA: Diagnosis not present

## 2020-12-07 MED ORDER — DONEPEZIL HCL 10 MG PO TABS: 10.0000 mg | ORAL_TABLET | Freq: Every day | ORAL | 1 refills | Status: DC

## 2020-12-07 NOTE — Patient Instructions (Addendum)
Visit Information  Goals Addressed            This Visit's Progress   . Learn More About My Health       Timeframe:  Long-Range Goal Priority:  High Start Date:                             Expected End Date:                        Follow Up Date 06/08/2021    - tell my story and reason for my visit - make a list of questions - repeat what I heard to make sure I understand - bring a list of my medicines to the visit    Why is this important?    The best way to learn about your health and care is by talking to the doctor and nurse.   They will answer your questions and give you information in the way that you like best.    Notes:     Marland Kitchen Manage My Medicine       Timeframe:  Long-Range Goal Priority:  High Start Date:                             Expected End Date:                       Follow Up Date 06/08/2021   - call for medicine refill 2 or 3 days before it runs out - keep a list of all the medicines I take; vitamins and herbals too    Why is this important?   . These steps will help you keep on track with your medicines.   Notes:     . Track and Manage My Blood Pressure-Hypertension       Timeframe:  Long-Range Goal Priority:  High Start Date:                             Expected End Date:                       Follow Up Date 06/08/2021    - check blood pressure weekly - write blood pressure results in a log or diary    Why is this important?    You won't feel high blood pressure, but it can still hurt your blood vessels.   High blood pressure can cause heart or kidney problems. It can also cause a stroke.   Making lifestyle changes like losing a little weight or eating less salt will help.   Checking your blood pressure at home and at different times of the day can help to control blood pressure.   If the doctor prescribes medicine remember to take it the way the doctor ordered.   Call the office if you cannot afford the medicine or if there are  questions about it.     Notes:       Patient Care Plan: CCM Pharmacy Care Plan    Problem Identified: htn, hld, chf, afib   Priority: High  Onset Date: 12/07/2020    Long-Range Goal: Disease Management   Start Date: 12/07/2020  Expected End Date: 12/07/2021  This Visit's Progress: On track  Priority: High  Note:  Current Barriers:  . Unable to independently afford treatment regimen  Pharmacist Clinical Goal(s):  Marland Kitchen Patient will verbalize ability to afford treatment regimen through collaboration with PharmD and provider.   Interventions: . 1:1 collaboration with [name], MD regarding development and update of comprehensive plan of care as evidenced by provider attestation and co-signature . Inter-disciplinary care team collaboration (see longitudinal plan of care) . Comprehensive medication review performed; medication list updated in electronic medical record  Hypertension (BP goal <130/80) -Controlled -Current treatment: . Amlodipine 5 mg bid . Carvedilol 3.125 mg bid with a meal . Furosemide 40 mg twice weekly . Valsartan 320 mg daily . Potassium 10 meq daily prn  -Medications previously tried: diltiazem  -Current home readings: not checking -Current dietary habits: sandwich, cookies, home cooking -Current exercise habits: no regular exercise -Denies hypotensive/hypertensive symptoms -Educated on BP goals and benefits of medications for prevention of heart attack, stroke and kidney damage; Daily salt intake goal < 2300 mg; Exercise goal of 150 minutes per week; Importance of home blood pressure monitoring; -Counseled to monitor BP at home weekly, document, and provide log at future appointments -Counseled on diet and exercise extensively Recommended to continue current medication Recommended beginning to check bp weekly at home   Hyperlipidemia: (LDL goal < 55) -Not ideally controlled -Current treatment: . atorvastatin 10 mg daily at bedtime -Medications  previously tried: none reported  -Current dietary patterns: no specific diet.  -Current exercise habits: no regular exercise -Educated on Cholesterol goals;  Benefits of statin for ASCVD risk reduction; Importance of limiting foods high in cholesterol; Exercise goal of 150 minutes per week; -Counseled on diet and exercise extensively Recommended to continue current medication  Atrial Fibrillation (Goal: prevent stroke and major bleeding) -Controlled -CHADSVASC: 5 -Current treatment: . Rate control: amiodarone 200 mg daily, carvedilol 3.25 mg bid  . Anticoagulation:  apixaban 5 mg bid -Medications previously tried: diltiazem -Home BP and HR readings: not checking currently  -Counseled on increased risk of stroke due to Afib and benefits of anticoagulation for stroke prevention; importance of adherence to anticoagulant exactly as prescribed; bleeding risk associated with Eliquis and importance of self-monitoring for signs/symptoms of bleeding; avoidance of NSAIDs due to increased bleeding risk with anticoagulants; Coordinating patient assistance application for Eliquis and samples of Eliquis. -Recommended to continue current medication Assessed patient finances. Patient has not spent 3% of income on medication at this time. Will continue to check for Eliquis PAP applicaiton    Heart Failure (Goal: manage symptoms and prevent exacerbations) -Controlled -Last ejection fraction: 08/18 (Date: 55-60%) -HF type: Diastolic -Current treatment: . Amlodipine 5 mg daily . Carvedilol 3.125 mg bid with a meal . Furosemide 40 mg twice weekly . Valsartan 320 mg daily . Potassium 10 meq daily prn  . Nitroglycerin 0.4 mg sl every 5 minutes prn chest pain  -Medications previously tried: diltiazem   -Current home BP/HR readings: none reported/not checking at home -Current dietary habits: denies specific diet. Eats sandwiches some and wife cooks some.  -Current exercise habits: no regular  exercise -Educated on Benefits of medications for managing symptoms and prolonging life Proper diuretic administration and potassium supplementation Importance of blood pressure control -Counseled on diet and exercise extensively Recommended to continue current medication  Hypothyroid (Goal: manage TSH/Symptoms ) -Not ideally controlled at last visit - labs rechecked with 3 month follow-up -Current treatment  . levothyroxine 150 mcg daily  -Medications previously tried: none reported  -Recommended to continue current medication    Memory (Goal: manage  memory) -Controlled -Current treatment  . donepezil 10 mg daily  -Medications previously tried: none reported  -Recommended to continue current medication  Health Maintenance -Vaccine gaps: recommended 4th COVID -Current therapy:  . Fiber choice daily  . Vitamin D 50,000 units every Saturday am  . Zolpidem 10 mg daily at bedtime -Patient is satisfied with current therapy and denies issues -Counseled on diet and exercise extensively Recommended to continue current medication   Patient Goals/Self-Care Activities . Patient will:  - take medications as prescribed focus on medication adherence by considering using a pill box target a minimum of 150 minutes of moderate intensity exercise weekly engage in dietary modifications by limiting salt and focusing on heart-healthy diet.   Follow Up Plan: Telephone follow up appointment with care management team member scheduled for:05/2021      The patient verbalized understanding of instructions, educational materials, and care plan provided today and declined offer to receive copy of patient instructions, educational materials, and care plan.  Telephone follow up appointment with pharmacy team member scheduled for:05/2021  Burnice Logan, [facility]  [email or web address]">  DASH Eating Plan DASH stands for Dietary Approaches to Stop  Hypertension. The DASH eating plan is a healthy eating plan that has been shown to:  Reduce high blood pressure (hypertension).  Reduce your risk for type 2 diabetes, heart disease, and stroke.  Help with weight loss. What are tips for following this plan? Reading food labels  Check food labels for the amount of salt (sodium) per serving. Choose foods with less than 5 percent of the Daily Value of sodium. Generally, foods with less than 300 milligrams (mg) of sodium per serving fit into this eating plan.  To find whole grains, look for the word "whole" as the first word in the ingredient list. Shopping  Buy products labeled as "low-sodium" or "no salt added."  Buy fresh foods. Avoid canned foods and pre-made or frozen meals. Cooking  Avoid adding salt when cooking. Use salt-free seasonings or herbs instead of table salt or sea salt. Check with your health care provider or pharmacist before using salt substitutes.  Do not fry foods. Cook foods using healthy methods such as baking, boiling, grilling, roasting, and broiling instead.  Cook with heart-healthy oils, such as olive, canola, avocado, soybean, or sunflower oil. Meal planning  Eat a balanced diet that includes: ? 4 or more servings of fruits and 4 or more servings of vegetables each day. Try to fill one-half of your plate with fruits and vegetables. ? 6-8 servings of whole grains each day. ? Less than 6 oz (170 g) of lean meat, poultry, or fish each day. A 3-oz (85-g) serving of meat is about the same size as a deck of cards. One egg equals 1 oz (28 g). ? 2-3 servings of low-fat dairy each day. One serving is 1 cup (237 mL). ? 1 serving of nuts, seeds, or beans 5 times each week. ? 2-3 servings of heart-healthy fats. Healthy fats called omega-3 fatty acids are found in foods such as walnuts, flaxseeds, fortified milks, and eggs. These fats are also found in cold-water fish, such as sardines, salmon, and mackerel.  Limit how  much you eat of: ? Canned or prepackaged foods. ? Food that is high in trans fat, such as some fried foods. ? Food that is high in saturated fat, such as fatty meat. ? Desserts and other sweets, sugary drinks, and other foods with added sugar. ? Full-fat dairy products.  Do not salt  foods before eating.  Do not eat more than 4 egg yolks a week.  Try to eat at least 2 vegetarian meals a week.  Eat more home-cooked food and less restaurant, buffet, and fast food.   Lifestyle  When eating at a restaurant, ask that your food be prepared with less salt or no salt, if possible.  If you drink alcohol: ? Limit how much you use to:  0-1 drink a day for women who are not pregnant.  0-2 drinks a day for men. ? Be aware of how much alcohol is in your drink. In the [place] one drink equals one 12 oz bottle of beer (355 mL), one 5 oz glass of wine (148 mL), or one 1 oz glass of hard liquor (44 mL). General information  Avoid eating more than 2,300 mg of salt a day. If you have hypertension, you may need to reduce your sodium intake to 1,500 mg a day.  Work with your health care provider to maintain a healthy body weight or to lose weight. Ask what an ideal weight is for you.  Get at least 30 minutes of exercise that causes your heart to beat faster (aerobic exercise) most days of the week. Activities may include walking, swimming, or biking.  Work with your health care provider or dietitian to adjust your eating plan to your individual calorie needs. What foods should I eat? Fruits All fresh, dried, or frozen fruit. Canned fruit in natural juice (without added sugar). Vegetables Fresh or frozen vegetables (raw, steamed, roasted, or grilled). Low-sodium or reduced-sodium tomato and vegetable juice. Low-sodium or reduced-sodium tomato sauce and tomato paste. Low-sodium or reduced-sodium canned vegetables. Grains Whole-grain or whole-wheat bread. Whole-grain or whole-wheat pasta. T[place]onda Pinkus rice.  [place]. Bulgur. Whole-grain and low-sodium cereals. Pita bread. Low-fat, low-sodium crackers. Whole-wheat flour tortillas. Meats and other proteins Skinless chicken or [place]. Ground chicken or [place]. Pork with fat trimmed off. Fish and seafood. Egg whites. Dried beans, peas, or lentils. Unsalted nuts, nut butters, and seeds. Unsalted canned beans. Lean cuts of beef with fat trimmed off. Low-sodium, lean precooked or cured meat, such as sausages or meat loaves. Dairy Low-fat (1%) or fat-free (skim) milk. Reduced-fat, low-fat, or fat-free cheeses. Nonfat, low-sodium ricotta or cottage cheese. Low-fat or nonfat yogurt. Low-fat, low-sodium cheese. Fats and oils Soft margarine without trans fats. Vegetable oil. Reduced-fat, low-fat, or light mayonnaise and salad dressings (reduced-sodium). Canola, safflower, olive, avocado, soybean, and sunflower oils. Avocado. Seasonings and condiments Herbs. Spices. Seasoning mixes without salt. Other foods Unsalted popcorn and pretzels. Fat-free sweets. The items listed above may not be a complete list of foods and beverages you can eat. Contact a dietitian for more information. What foods should I avoid? Fruits Canned fruit in a light or heavy syrup. Fried fruit. Fruit in cream or butter sauce. Vegetables Creamed or fried vegetables. Vegetables in a cheese sauce. Regular canned vegetables (not low-sodium or reduced-sodium). Regular canned tomato sauce and paste (not low-sodium or reduced-sodium). Regular tomato and vegetable juice (not low-sodium or reduced-sodium). Angie Fava. Olives. Grains Baked goods made with fat, such as croissants, muffins, or some breads. Dry pasta or rice meal packs. Meats and other proteins Fatty cuts of meat. Ribs. Fried meat. Berniece Salines. Bologna, salami, and other precooked or cured meats, such as sausages or meat loaves. Fat from the back of a pig (fatback). Bratwurst. Salted nuts and seeds. Canned beans with added salt. Canned or  smoked fish. Whole eggs or egg yolks. Chicken or [place] with skin.  Dairy Whole or 2% milk, cream, and half-and-half. Whole or full-fat cream cheese. Whole-fat or sweetened yogurt. Full-fat cheese. Nondairy creamers. Whipped toppings. Processed cheese and cheese spreads. Fats and oils Butter. Stick margarine. Lard. Shortening. Ghee. Bacon fat. Tropical oils, such as coconut, palm kernel, or palm oil. Seasonings and condiments Onion salt, garlic salt, seasoned salt, table salt, and sea salt. Worcestershire sauce. Tartar sauce. Barbecue sauce. Teriyaki sauce. Soy sauce, including reduced-sodium. Steak sauce. Canned and packaged gravies. Fish sauce. Oyster sauce. Cocktail sauce. Store-bought horseradish. Ketchup. Mustard. Meat flavorings and tenderizers. Bouillon cubes. Hot sauces. Pre-made or packaged marinades. Pre-made or packaged taco seasonings. Relishes. Regular salad dressings. Other foods Salted popcorn and pretzels. The items listed above may not be a complete list of foods and beverages you should avoid. Contact a dietitian for more information. Where to find more information  National Heart, Lung, and Blood Institute: https://wilson-eaton.com/  American Heart Association: www.heart.org  Academy of Nutrition and Dietetics: www.eatright.[place]: www.kidney.org Summary  The DASH eating plan is a healthy eating plan that has been shown to reduce high blood pressure (hypertension). It may also reduce your risk for type 2 diabetes, heart disease, and stroke.  When on the DASH eating plan, aim to eat more fresh fruits and vegetables, whole grains, lean proteins, low-fat dairy, and heart-healthy fats.  With the DASH eating plan, you should limit salt (sodium) intake to 2,300 mg a day. If you have hypertension, you may need to reduce your sodium intake to 1,500 mg a day.  Work with your health care provider or dietitian to adjust your eating plan to your individual calorie  needs. This information is not intended to replace advice given to you by your health care provider. Make sure you discuss any questions you have with your health care provider. Document Revised: 07/19/2019 Document Reviewed: 07/19/2019 Elsevier Patient Education  2021 [employer].

## 2020-12-14 DIAGNOSIS — L02212 Cutaneous abscess of back [any part, except buttock]: Secondary | ICD-10-CM | POA: Diagnosis not present

## 2020-12-14 DIAGNOSIS — L723 Sebaceous cyst: Secondary | ICD-10-CM | POA: Insufficient documentation

## 2020-12-19 ENCOUNTER — Other Ambulatory Visit: Payer: Self-pay | Admitting: Family Medicine

## 2020-12-21 ENCOUNTER — Other Ambulatory Visit: Payer: Self-pay | Admitting: Physician Assistant

## 2020-12-21 ENCOUNTER — Other Ambulatory Visit: Payer: Self-pay | Admitting: Family Medicine

## 2020-12-21 ENCOUNTER — Other Ambulatory Visit: Payer: Self-pay

## 2020-12-21 DIAGNOSIS — I110 Hypertensive heart disease with heart failure: Secondary | ICD-10-CM

## 2020-12-21 MED ORDER — AMLODIPINE BESYLATE 5 MG PO TABS: 1.0000 | ORAL_TABLET | Freq: Every day | ORAL | 2 refills | Status: DC

## 2020-12-21 NOTE — Telephone Encounter (Signed)
Refill of Amlodipine 5 mg sent to [facility].

## 2020-12-22 ENCOUNTER — Other Ambulatory Visit: Payer: Self-pay

## 2020-12-22 ENCOUNTER — Ambulatory Visit (INDEPENDENT_AMBULATORY_CARE_PROVIDER_SITE_OTHER): Payer: Medicare Other | Admitting: Family Medicine

## 2020-12-22 ENCOUNTER — Encounter: Payer: Self-pay | Admitting: Family Medicine

## 2020-12-22 VITALS — BP 120/64 | HR 80 | Temp 98.7°F | Resp 16 | Ht 70.0 in | Wt 256.0 lb

## 2020-12-22 DIAGNOSIS — E039 Hypothyroidism, unspecified: Secondary | ICD-10-CM | POA: Diagnosis not present

## 2020-12-22 DIAGNOSIS — I130 Hypertensive heart and chronic kidney disease with heart failure and stage 1 through stage 4 chronic kidney disease, or unspecified chronic kidney disease: Secondary | ICD-10-CM

## 2020-12-22 DIAGNOSIS — N183 Chronic kidney disease, stage 3 unspecified: Secondary | ICD-10-CM

## 2020-12-22 DIAGNOSIS — E782 Mixed hyperlipidemia: Secondary | ICD-10-CM

## 2020-12-22 DIAGNOSIS — H539 Unspecified visual disturbance: Secondary | ICD-10-CM | POA: Diagnosis not present

## 2020-12-22 DIAGNOSIS — R7301 Impaired fasting glucose: Secondary | ICD-10-CM

## 2020-12-22 DIAGNOSIS — I482 Chronic atrial fibrillation, unspecified: Secondary | ICD-10-CM

## 2020-12-22 DIAGNOSIS — D6869 Other thrombophilia: Secondary | ICD-10-CM

## 2020-12-22 DIAGNOSIS — I5032 Chronic diastolic (congestive) heart failure: Secondary | ICD-10-CM

## 2020-12-22 DIAGNOSIS — Z6836 Body mass index (BMI) 36.0-36.9, adult: Secondary | ICD-10-CM

## 2020-12-22 DIAGNOSIS — J410 Simple chronic bronchitis: Secondary | ICD-10-CM | POA: Diagnosis not present

## 2020-12-22 DIAGNOSIS — I69398 Other sequelae of cerebral infarction: Secondary | ICD-10-CM | POA: Diagnosis not present

## 2020-12-22 DIAGNOSIS — I110 Hypertensive heart disease with heart failure: Secondary | ICD-10-CM

## 2020-12-22 NOTE — Patient Instructions (Signed)
Recommend continue to work on eating healthy diet and exercise.  

## 2020-12-22 NOTE — Progress Notes (Signed)
Subjective:  Patient ID: John Kemp, male    DOB: 1945/12/07  Age: 75 y.o. MRN: [id number]  Chief Complaint  Patient presents with  . Hyperlipidemia  . Hypertension  . Atrial Fibrillation   HPI  Atrial fibrillation: on amiodarone, metoprolol, and eliquis.  Hypertensive heart disease with CAD s/p CABG and diastolic heart failure: on amlodipine, lasix, metoprolol, aspirin, and valsartan.Marland Kitchen Hyperlipidemia: on lipitor 10 mg once daily.  Memory loss: on aricept 10 mg once at night.  Not eating healthy or exercising.  Hypothyoidism: On synthroid 150 mcg daily. I adjusted dose at last blood draw.  Insomnia: ambien rx given. COPD: on ipratropium. Used to smoke. Denies dyspnea.   Current Outpatient Medications on File Prior to Visit  Medication Sig Dispense Refill  . amiodarone (PACERONE) 200 MG tablet TAKE 1 TABLET BY MOUTH EVERY DAY 90 tablet 2  . amLODipine (NORVASC) 5 MG tablet Take 1 tablet (5 mg total) by mouth daily. 90 tablet 2  . apixaban (ELIQUIS) 5 MG TABS tablet Take 1 tablet (5 mg total) by mouth 2 (two) times daily. 60 tablet 2  . aspirin 81 MG chewable tablet Chew by mouth.    Marland Kitchen atorvastatin (LIPITOR) 10 MG tablet TAKE 1 TABLET BY MOUTH EVERYDAY AT BEDTIME 90 tablet 0  . carvedilol (COREG) 3.125 MG tablet TAKE 1 TABLET (3.125 MG TOTAL) BY MOUTH 2 (TWO) TIMES DAILY WITH A MEAL. 180 tablet 2  . cloNIDine (CATAPRES) 0.1 MG tablet Take by mouth.    . donepezil (ARICEPT) 10 MG tablet Take 1 tablet (10 mg total) by mouth at bedtime. 90 tablet 1  . furosemide (LASIX) 40 MG tablet Take 1 tablet twice weekly on Wednesday and Saturday. 24 tablet 3  . Inulin (FIBER CHOICE PO) Take 3 tablets by mouth daily.    Marland Kitchen ipratropium (ATROVENT) 0.02 % nebulizer solution Inhale into the lungs.    Marland Kitchen levothyroxine (SYNTHROID) 150 MCG tablet TAKE 1 TABLET BY MOUTH EVERY DAY 90 tablet 0  . nitroGLYCERIN (NITROSTAT) 0.4 MG SL tablet Place 1 tablet (0.4 mg total) under the tongue every 5 (five)  minutes as needed for chest pain. 25 tablet 5  . potassium chloride (K-DUR,KLOR-CON) 10 MEQ tablet Take 10 mEq by mouth daily as needed (for fluid retention.).     Marland Kitchen valsartan (DIOVAN) 320 MG tablet TAKE ONE TABLET BY MOUTH DAILY 90 tablet 3  . Vitamin D, Ergocalciferol, (DRISDOL) 1.25 MG (50000 UNIT) CAPS capsule TAKE 1 CAPSULE (50,000 UNITS TOTAL) BY MOUTH EVERY SATURDAY. MORNING. 12 capsule 0  . zolpidem (AMBIEN) 10 MG tablet TAKE ONE TABLET BY MOUTH AT BEDTIME 30 tablet 3   No current facility-administered medications on file prior to visit.   Past Medical History:  Diagnosis Date  . A-fib ([place])   . Anticoagulated 01/21/2015  . Atherosclerotic heart disease of native coronary artery without angina pectoris   . Blepharitis of left lower eyelid 12/02/2019  . CAD in native artery 01/21/2015  . Cancer ([place])    skin cancer  . Chest pain 04/02/2017  . CHF (congestive heart failure) ([place]) 11/05/2019  . Chronic diastolic (congestive) heart failure ([place])   . Chronic diastolic heart failure ([place]) 01/21/2015   Last Assessment & Plan:  Is congestive heart failure is mildly decompensated he is edematous he'll increase the dose of his diuretic today the lab work performed including an ARB referred to care transitions for home care he'll follow-up in my office with me in 4-6 weeks daily weights sodium  restrictionand compliance of medications beinghemoglobin of will  . Chronic obstructive pulmonary disease (COPD) ([place])   . Dyspnea 12/31/2014   Followed in Pulmonary clinic/ [facility]/ [place]  - amiodarone rx 11/2014 >>> - PFTs rec 12/31/2014    . Heart attack ([place])   . High cholesterol 10/20/2017  . Hx of CABG 03/02/2017  . Hyperlipidemia   . Hypertension   . Hypertensive heart disease 01/21/2015  . Hypertensive heart disease with heart failure ([place])   . Hypothyroidism   . Hypothyroidism (acquired) 04/24/2018  . Impaired fasting glucose   . Leg fatigue 03/03/2017  . Obesity 04/02/2017  . On amiodarone  therapy 01/21/2015  . PAF (paroxysmal atrial fibrillation) ([place])   . Primary insomnia   . Squamous cell carcinoma 11/05/2019  . 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 Stroke ([place])   . Typical atrial flutter ([place]) 01/21/2015  . Vitamin D deficiency, unspecified    Past Surgical History:  Procedure Laterality Date  . APPENDECTOMY  1956  . CARDIAC CATHETERIZATION    . CARDIOVERSION  2002  . CHOLECYSTECTOMY  2006  . CORONARY ARTERY BYPASS GRAFT  2001  . HERNIA REPAIR    . RADIOLOGY WITH ANESTHESIA Bilateral 01/18/2018   Procedure: MRI WITH ANESTHESIA LUMBAR SPINE WITH AND WITHOUT CONTRAST;  Surgeon: Radiologist, Medication, MD;  Location: [facility];  Service: Radiology;  Laterality: Bilateral;  . ROTATOR CUFF REPAIR Left   . TONSILLECTOMY  1975    Family History  Problem Relation Age of Onset  . Heart disease Father   . Arrhythmia Father   . Breast cancer Mother   . Diabetes type II Sister   . Diabetes Brother    Social History   Socioeconomic History  . Marital status: Married    Spouse name: Not on file  . Number of children: Not on file  . Years of education: Not on file  . Highest education level: Not on file  Occupational History  . Occupation: Retired  Tobacco Use  . Smoking status: Former Smoker    Packs/day: 1.00    Years: 15.00    Pack years: 15.00    Types: Cigarettes    Quit date: 08/29/1978    Years since quitting: 42.3  . Smokeless tobacco: Never Used  Vaping Use  . Vaping Use: Never used  Substance and Sexual Activity  . Alcohol use: No    Alcohol/week: 0.0 standard drinks  . Drug use: No  . Sexual activity: Not Currently  Other Topics Concern  . Not on file  Social History Narrative  . Not on file   Social Determinants of Health   Financial Resource Strain: Not on file  Food Insecurity: No Food Insecurity  . Worried About Charity fundraiser in the Last Year: Never true  . Ran Out of Food in the Last Year: Never true   Transportation Needs: Not on file  Physical Activity: Not on file  Stress: Not on file  Social Connections: Not on file    Review of Systems  Constitutional: Negative for chills, fatigue and fever.  HENT: Negative for congestion, ear pain and sore throat.   Respiratory: Negative for cough and shortness of breath.   Cardiovascular: Negative for chest pain.  Gastrointestinal: Negative for abdominal pain, constipation, diarrhea, nausea and vomiting.  Endocrine: Negative for polydipsia, polyphagia and polyuria.  Genitourinary: Negative for dysuria and frequency.  Musculoskeletal: Negative for arthralgias and myalgias.  Neurological: Negative for dizziness and headaches.  Psychiatric/Behavioral: Negative for dysphoric mood.       No dysphoria     Objective:  BP 120/64   Pulse 80   Temp 98.7 F (37.1 C)   Resp 16   Ht 5\' 10"  (1.778 m)   Wt 256 lb (116.1 kg)   BMI 36.73 kg/m   BP/Weight 12/22/2020 11/12/2020 3/66/4403  Systolic BP 474 259 563  Diastolic BP 64 88 68  Wt. (Lbs) 256 256 252  BMI 36.73 35.7 35.15    Physical Exam Vitals reviewed.  Constitutional:      Appearance: Normal appearance. He is obese.  Neck:     Vascular: No carotid bruit.  Cardiovascular:     Rate and Rhythm: Normal rate. Rhythm irregular.     Heart sounds: Normal heart sounds.  Pulmonary:     Effort: Pulmonary effort is normal.     Breath sounds: Normal breath sounds. No wheezing, rhonchi or rales.  Abdominal:     General: Bowel sounds are normal.     Palpations: Abdomen is soft.     Tenderness: There is no abdominal tenderness.  Neurological:     Mental Status: He is alert.  Psychiatric:        Mood and Affect: Mood normal.        Behavior: Behavior normal.     Diabetic Foot Exam - Simple   No data filed      Lab Results  Component Value Date   WBC 6.1 12/22/2020   HGB 14.4 12/22/2020   HCT 42.3 12/22/2020   PLT 202 12/22/2020   GLUCOSE 94 12/22/2020   CHOL 140 12/22/2020    TRIG 118 12/22/2020   HDL 35 (L) 12/22/2020   LDLCALC 83 12/22/2020   ALT 24 12/22/2020   AST 23 12/22/2020   NA 140 12/22/2020   K 4.2 12/22/2020   CL 100 12/22/2020   CREATININE 1.30 (H) 12/22/2020   BUN 12 12/22/2020   CO2 26 12/22/2020   TSH 2.050 12/22/2020   HGBA1C 5.3 05/22/2020   MICROALBUR Positive 150 01/23/2019      Assessment & Plan:  1. Mixed hyperlipidemia Fairly Well controlled.  No changes to medicines.  Continue to work on eating a healthy diet and exercise.  Labs drawn today.  - Lipid panel  2.  Impaired fasting glucose Recommend continue to work on eating healthy diet and exercise.  3. Acquired thrombophilia ([place]) Secondary to eliquis  4. Hypothyroidism (acquired) The current medical regimen is effective;  continue present plan and medications. - TSH  5. Chronic atrial fibrillation (H[facility]) The current medical regimen is effective;  continue present plan and medications.  6. Disturbances of vision, late effect of stroke On eliquis, aspirin, and atorvastatin.   7. Severe obesity with body mass index (BMI) of 36.0 to 36.9 with serious comorbidity ([place]) Recommend continue to work on eating healthy diet and exercise.  8. Hypertensive heart and kidney disease with chronic diastolic congestive heart failure and stage 3 chronic kidney disease, unspecified whether stage 3a or 3b CKD ([place]) Stable  Continue to work on eating a healthy diet and exercise.  Labs drawn today.  - CBC with Differential/Platelet - Comprehensive metabolic panel - Cardiovascular Risk Assessment - aspirin 81 MG chewable tablet; Chew by mouth. - cloNIDine (CATAPRES) 0.1 MG tablet; Take by mouth.  9. Simple chronic bronchitis (HCC) - ipratropium (ATROVENT) 0.02 % nebulizer solution; Inhale into the lungs.    Orders Placed This Encounter  Procedures  . CBC with Differential/Platelet  .  Comprehensive metabolic panel  . Lipid panel  . TSH  . Cardiovascular Risk Assessment      Follow-up: Return in about 4 months (around 04/23/2021) for fasting.  An After Visit Summary was printed and given to the patient.  [name], MD [facility] [phone]

## 2020-12-23 LAB — CBC WITH DIFFERENTIAL/PLATELET
Basophils Absolute: 0.1 10*3/uL (ref 0.0–0.2)
Basos: 1 %
EOS (ABSOLUTE): 0.4 10*3/uL (ref 0.0–0.4)
Eos: 7 %
Hematocrit: 42.3 % (ref 37.5–51.0)
Hemoglobin: 14.4 g/dL (ref 13.0–17.7)
Immature Grans (Abs): 0 10*3/uL (ref 0.0–0.1)
Immature Granulocytes: 0 %
Lymphocytes Absolute: 1.4 10*3/uL (ref 0.7–3.1)
Lymphs: 24 %
MCH: 31 pg (ref 26.6–33.0)
MCHC: 34 g/dL (ref 31.5–35.7)
MCV: 91 fL (ref 79–97)
Monocytes Absolute: 0.8 10*3/uL (ref 0.1–0.9)
Monocytes: 14 %
Neutrophils Absolute: 3.3 10*3/uL (ref 1.4–7.0)
Neutrophils: 54 %
Platelets: 202 10*3/uL (ref 150–450)
RBC: 4.65 x10E6/uL (ref 4.14–5.80)
RDW: 12.3 % (ref 11.6–15.4)
WBC: 6.1 10*3/uL (ref 3.4–10.8)

## 2020-12-23 LAB — LIPID PANEL
Chol/HDL Ratio: 4 ratio (ref 0.0–5.0)
Cholesterol, Total: 140 mg/dL (ref 100–199)
HDL: 35 mg/dL — ABNORMAL LOW (ref >39)
LDL Chol Calc (NIH): 83 mg/dL (ref 0–99)
Triglycerides: 118 mg/dL (ref 0–149)
VLDL Cholesterol Cal: 22 mg/dL (ref 5–40)

## 2020-12-23 LAB — COMPREHENSIVE METABOLIC PANEL
ALT: 24 IU/L (ref 0–44)
AST: 23 IU/L (ref 0–40)
Albumin/Globulin Ratio: 1.8 (ref 1.2–2.2)
Albumin: 4.4 g/dL (ref 3.7–4.7)
Alkaline Phosphatase: 103 IU/L (ref 44–121)
BUN/Creatinine Ratio: 9 — ABNORMAL LOW (ref 10–24)
BUN: 12 mg/dL (ref 8–27)
Bilirubin Total: 1 mg/dL (ref 0.0–1.2)
CO2: 26 mmol/L (ref 20–29)
Calcium: 9.4 mg/dL (ref 8.6–10.2)
Chloride: 100 mmol/L (ref 96–106)
Creatinine, Ser: 1.3 mg/dL — ABNORMAL HIGH (ref 0.76–1.27)
Globulin, Total: 2.5 g/dL (ref 1.5–4.5)
Glucose: 94 mg/dL (ref 65–99)
Potassium: 4.2 mmol/L (ref 3.5–5.2)
Sodium: 140 mmol/L (ref 134–144)
Total Protein: 6.9 g/dL (ref 6.0–8.5)
eGFR: 58 mL/min/{1.73_m2} — ABNORMAL LOW (ref >59)

## 2020-12-23 LAB — TSH: TSH: 2.05 u[IU]/mL (ref 0.450–4.500)

## 2020-12-23 LAB — CARDIOVASCULAR RISK ASSESSMENT

## 2020-12-29 DIAGNOSIS — L02212 Cutaneous abscess of back [any part, except buttock]: Secondary | ICD-10-CM | POA: Diagnosis not present

## 2020-12-29 DIAGNOSIS — L723 Sebaceous cyst: Secondary | ICD-10-CM | POA: Diagnosis not present

## 2021-01-04 ENCOUNTER — Encounter: Payer: Self-pay | Admitting: Family Medicine

## 2021-02-01 ENCOUNTER — Other Ambulatory Visit: Payer: Self-pay | Admitting: Family Medicine

## 2021-02-03 ENCOUNTER — Telehealth: Payer: Self-pay

## 2021-02-03 NOTE — Progress Notes (Signed)
Chronic Care Management Pharmacy Assistant   Name: John Kemp  MRN: [id number] DOB: [birth year]   Reason for Encounter: Disease State for general adherence and out of pocket for Eliquis    Recent office visits:  01/04/21-Colonoscopy ordered  12/29/20-[facility], back abscess drainage  12/22/20-Dr Cox PCP, HDL, Labs, Blood count normal.  Liver function normal.  Kidney function abnormal, but stable.  Thyroid function normal.  Cholesterol:at goal  Recent consult visits:  12/14/20-initial consult for back abscess, right upper back mass about a week or so ago. The area started getting red and painful. The area is draining some purulent material. Patient denies fevers or chills. His wife evaluated this area and felt that he had a sebaceous cyst abscess. He presents for discussion regarding possible drainage of the abscess. Of note is that the patient is on Eliquis for atrial fibrillation.   Hospital visits:  None in previous 6 months  Medications: Outpatient Encounter Medications as of 02/03/2021  Medication Sig   amiodarone (PACERONE) 200 MG tablet TAKE 1 TABLET BY MOUTH EVERY DAY   amLODipine (NORVASC) 5 MG tablet Take 1 tablet (5 mg total) by mouth daily.   aspirin 81 MG chewable tablet Chew by mouth.   atorvastatin (LIPITOR) 10 MG tablet TAKE 1 TABLET BY MOUTH EVERYDAY AT BEDTIME   carvedilol (COREG) 3.125 MG tablet TAKE 1 TABLET (3.125 MG TOTAL) BY MOUTH 2 (TWO) TIMES DAILY WITH A MEAL.   cloNIDine (CATAPRES) 0.1 MG tablet Take by mouth.   donepezil (ARICEPT) 10 MG tablet Take 1 tablet (10 mg total) by mouth at bedtime.   ELIQUIS 5 MG TABS tablet TAKE 1 TABLET BY MOUTH TWICE A DAY   furosemide (LASIX) 40 MG tablet Take 1 tablet twice weekly on Wednesday and Saturday.   Inulin (FIBER CHOICE PO) Take 3 tablets by mouth daily.   ipratropium (ATROVENT) 0.02 % nebulizer solution Inhale into the lungs.   levothyroxine (SYNTHROID) 150 MCG tablet TAKE 1 TABLET BY MOUTH  EVERY DAY   nitroGLYCERIN (NITROSTAT) 0.4 MG SL tablet Place 1 tablet (0.4 mg total) under the tongue every 5 (five) minutes as needed for chest pain.   potassium chloride (K-DUR,KLOR-CON) 10 MEQ tablet Take 10 mEq by mouth daily as needed (for fluid retention.).    valsartan (DIOVAN) 320 MG tablet TAKE ONE TABLET BY MOUTH DAILY   Vitamin D, Ergocalciferol, (DRISDOL) 1.25 MG (50000 UNIT) CAPS capsule TAKE 1 CAPSULE (50,000 UNITS TOTAL) BY MOUTH EVERY SATURDAY. MORNING.   zolpidem (AMBIEN) 10 MG tablet TAKE ONE TABLET BY MOUTH AT BEDTIME   No facility-administered encounter medications on file as of 02/03/2021.   Spoke to patient wife, she said he is doing fine.  Wife is going to check and call me back to see if he has met the $1300 out of pocket to get assistance with Eliquis.   [name] CPP was going to check to see if the office had some samples.   Wife stated patient does not check his blood pressures at home, she stated he is not very active, he does some things, but not a lot.  Wife stated he takes his medication and denies any issues with them.   Care Gaps: Colonoscopy: 11/06/18- has been ordered for 2022 Patient is not diabetic  AWV: 07/21/20  Star Rating Drugs: Eliquis  02/02/21  90 Atorvastatin 01/17/21 90 Carvedilol 11/14/20 90 Furosemide 11/14/20 90 Levothyroxine 11/29/20  90 Valsartan 01/14/21 90 Amlodipine 12/07/20 [place] Baltazar Najjar,  [place] Clinical Pharmacist Assistant [phone]

## 2021-02-17 DIAGNOSIS — Z20828 Contact with and (suspected) exposure to other viral communicable diseases: Secondary | ICD-10-CM | POA: Diagnosis not present

## 2021-02-17 DIAGNOSIS — R051 Acute cough: Secondary | ICD-10-CM | POA: Diagnosis not present

## 2021-02-18 ENCOUNTER — Emergency Department (HOSPITAL_COMMUNITY)
Admission: EM | Admit: 2021-02-18 | Discharge: 2021-02-18 | Disposition: A | Discharge status: Home / Self Care | Payer: Medicare Other | Attending: Emergency Medicine | Admitting: Emergency Medicine

## 2021-02-18 ENCOUNTER — Emergency Department (HOSPITAL_COMMUNITY): Payer: Medicare Other

## 2021-02-18 ENCOUNTER — Other Ambulatory Visit: Payer: Self-pay

## 2021-02-18 DIAGNOSIS — R5381 Other malaise: Secondary | ICD-10-CM

## 2021-02-18 DIAGNOSIS — E039 Hypothyroidism, unspecified: Secondary | ICD-10-CM | POA: Diagnosis not present

## 2021-02-18 DIAGNOSIS — Z7901 Long term (current) use of anticoagulants: Secondary | ICD-10-CM | POA: Insufficient documentation

## 2021-02-18 DIAGNOSIS — R0981 Nasal congestion: Secondary | ICD-10-CM | POA: Diagnosis present

## 2021-02-18 DIAGNOSIS — U071 COVID-19: Secondary | ICD-10-CM | POA: Insufficient documentation

## 2021-02-18 DIAGNOSIS — R0902 Hypoxemia: Secondary | ICD-10-CM | POA: Diagnosis not present

## 2021-02-18 DIAGNOSIS — I5032 Chronic diastolic (congestive) heart failure: Secondary | ICD-10-CM | POA: Diagnosis not present

## 2021-02-18 DIAGNOSIS — Z85828 Personal history of other malignant neoplasm of skin: Secondary | ICD-10-CM | POA: Insufficient documentation

## 2021-02-18 DIAGNOSIS — Z79899 Other long term (current) drug therapy: Secondary | ICD-10-CM | POA: Diagnosis not present

## 2021-02-18 DIAGNOSIS — R531 Weakness: Secondary | ICD-10-CM

## 2021-02-18 DIAGNOSIS — Z951 Presence of aortocoronary bypass graft: Secondary | ICD-10-CM | POA: Diagnosis not present

## 2021-02-18 DIAGNOSIS — I110 Hypertensive heart disease with heart failure: Secondary | ICD-10-CM | POA: Diagnosis not present

## 2021-02-18 DIAGNOSIS — I2510 Atherosclerotic heart disease of native coronary artery without angina pectoris: Secondary | ICD-10-CM | POA: Diagnosis not present

## 2021-02-18 DIAGNOSIS — I480 Paroxysmal atrial fibrillation: Secondary | ICD-10-CM | POA: Insufficient documentation

## 2021-02-18 DIAGNOSIS — I10 Essential (primary) hypertension: Secondary | ICD-10-CM | POA: Diagnosis not present

## 2021-02-18 DIAGNOSIS — R0602 Shortness of breath: Secondary | ICD-10-CM | POA: Diagnosis not present

## 2021-02-18 DIAGNOSIS — Z87891 Personal history of nicotine dependence: Secondary | ICD-10-CM | POA: Diagnosis not present

## 2021-02-18 DIAGNOSIS — J449 Chronic obstructive pulmonary disease, unspecified: Secondary | ICD-10-CM | POA: Diagnosis not present

## 2021-02-18 LAB — BRAIN NATRIURETIC PEPTIDE: B Natriuretic Peptide: 199.8 pg/mL — ABNORMAL HIGH (ref 0.0–100.0)

## 2021-02-18 LAB — COMPREHENSIVE METABOLIC PANEL WITH GFR
ALT: 20 U/L (ref 0–44)
AST: 27 U/L (ref 15–41)
Albumin: 4.1 g/dL (ref 3.5–5.0)
Alkaline Phosphatase: 80 U/L (ref 38–126)
Anion gap: 11 (ref 5–15)
BUN: 8 mg/dL (ref 8–23)
CO2: 25 mmol/L (ref 22–32)
Calcium: 9.2 mg/dL (ref 8.9–10.3)
Chloride: 99 mmol/L (ref 98–111)
Creatinine, Ser: 1.18 mg/dL (ref 0.61–1.24)
GFR, Estimated: >60 mL/min (ref >60)
Glucose, Bld: 97 mg/dL (ref 70–99)
Potassium: 3.9 mmol/L (ref 3.5–5.1)
Sodium: 135 mmol/L (ref 135–145)
Total Bilirubin: 1.8 mg/dL — ABNORMAL HIGH (ref 0.3–1.2)
Total Protein: 7.3 g/dL (ref 6.5–8.1)

## 2021-02-18 LAB — CBC WITH DIFFERENTIAL/PLATELET
Abs Immature Granulocytes: 0.02 K/uL (ref 0.00–0.07)
Basophils Absolute: 0 K/uL (ref 0.0–0.1)
Basophils Relative: 1 %
Eosinophils Absolute: 0.1 K/uL (ref 0.0–0.5)
Eosinophils Relative: 1 %
HCT: 44.7 % (ref 39.0–52.0)
Hemoglobin: 15 g/dL (ref 13.0–17.0)
Immature Granulocytes: 0 %
Lymphocytes Relative: 7 %
Lymphs Abs: 0.4 K/uL — ABNORMAL LOW (ref 0.7–4.0)
MCH: 30.5 pg (ref 26.0–34.0)
MCHC: 33.6 g/dL (ref 30.0–36.0)
MCV: 90.9 fL (ref 80.0–100.0)
Monocytes Absolute: 0.9 K/uL (ref 0.1–1.0)
Monocytes Relative: 16 %
Neutro Abs: 4 K/uL (ref 1.7–7.7)
Neutrophils Relative %: 75 %
Platelets: 171 K/uL (ref 150–400)
RBC: 4.92 MIL/uL (ref 4.22–5.81)
RDW: 12.9 % (ref 11.5–15.5)
WBC: 5.4 K/uL (ref 4.0–10.5)
nRBC: 0 % (ref 0.0–0.2)

## 2021-02-18 LAB — PROTIME-INR
INR: 1.3 — ABNORMAL HIGH (ref 0.8–1.2)
Prothrombin Time: 15.7 s — ABNORMAL HIGH (ref 11.4–15.2)

## 2021-02-18 MED ORDER — BEBTELOVIMAB 175 MG/2 ML IV (EUA)
175.0000 mg | Freq: Once | INTRAMUSCULAR | Status: AC
  Administered 2021-02-18: 175 mg via INTRAVENOUS
  Filled 2021-02-18: qty 2

## 2021-02-18 NOTE — ED Notes (Signed)
Pt walked well in room and Sa02 was 97%

## 2021-02-18 NOTE — ED Provider Notes (Signed)
Emergency Medicine Provider Triage Evaluation Note  John Kemp , a 75 y.o. male  was evaluated in triage.  Pt complains of leg weakness was unable to stand from his bed this morning. Has not been able to stand since without assistance. Diagnosed with Covid 19+ yesterday started on   Review of Systems  Positive: Leg weakness bilateral, fever Negative: Urinary retention, bowel incontinence   Physical Exam  BP (!) 170/96 (BP Location: Right Arm)   Pulse 67   Temp 100 F (37.8 C) (Oral)   Resp 18   SpO2 93%  Gen:   Awake, no distress   Resp:  Normal effort  MSK:   Moves extremities without difficulty  Other:    Medical Decision Making  Medically screening exam initiated at 8:02 AM.  Appropriate orders placed.  [name] was informed that the remainder of the evaluation will be completed by another provider, this initial triage assessment does not replace that evaluation, and the importance of remaining in the ED until their evaluation is complete.  Seen at [facility], prescribed maybe Paxlovid? Took it yesterday woke up with bilateral leg weakness today.    Janeece Fitting, PA-C 02/18/21 [id number]    Gareth Morgan, MD 02/19/21 [phone]

## 2021-02-18 NOTE — ED Provider Notes (Signed)
[facility] EMERGENCY DEPARTMENT Provider Note   CSN: [id number] Arrival date & time: 02/18/21  5009     History No chief complaint on file.   John Kemp is a 75 y.o. male.  The history is provided by the patient and medical records.  John Kemp is a 75 y.o. male who presents to the Emergency Department complaining of can't stand. He presents the emergency department for inability to stand that started this morning. He started feeling poorly two days ago with nasal congestion and malaise. He went to urgent care yesterday and was diagnosed with COVID-19. He was started on an oral medication for treatment. He is unsure the name of this medication. He has been compliant with the med. He states that this morning when he attempted to get out of bed he was very weak in his legs cannot hold him up. He fell back to the bed on attempt to stand. No headache, chest pain, back pain, abdominal pain, nausea, vomiting. He has mild cough. He states that currently his symptoms are significantly improved compared to this morning. He does have a history of atrial fibrillation, coronary artery disease and anticoagulation with eliquis.    Past Medical History:  Diagnosis Date   A-fib ([place])    Anticoagulated 01/21/2015   Atherosclerotic heart disease of native coronary artery without angina pectoris    Blepharitis of left lower eyelid 12/02/2019   CAD in native artery 01/21/2015   Cancer [facility])    skin cancer   Chest pain 04/02/2017   CHF (congestive heart failure) ([place]) 11/05/2019   Chronic diastolic (congestive) heart failure (HCC)    Chronic diastolic heart failure ([place]) 01/21/2015   Last Assessment & Plan:  Is congestive heart failure is mildly decompensated he is edematous he'll increase the dose of his diuretic today the lab work performed including an ARB referred to care transitions for home care he'll follow-up in my office with me in 4-6 weeks daily weights sodium  restrictionand compliance of medications beinghemoglobin of will   Chronic obstructive pulmonary disease (COPD) ([place])    Dyspnea 12/31/2014   Followed in Pulmonary clinic/ [facility]/ [place]  - amiodarone rx 11/2014 >>> - PFTs rec 12/31/2014     Heart attack ([place])    High cholesterol 10/20/2017   Hx of CABG 03/02/2017   Hyperlipidemia    Hypertension    Hypertensive heart disease 01/21/2015   Hypertensive heart disease with heart failure (HCC)    Hypothyroidism    Hypothyroidism (acquired) 04/24/2018   Impaired fasting glucose    Leg fatigue 03/03/2017   Obesity 04/02/2017   On amiodarone therapy 01/21/2015   PAF (paroxysmal atrial fibrillation) ([place])    Primary insomnia    Squamous cell carcinoma 11/05/2019   Stroke (cerebrum) ([place]) 09/2016   right superior quadrantanopsia   Stroke ([place])    Typical atrial flutter ([place]) 01/21/2015   Vitamin D deficiency, unspecified     Patient Active Problem List   Diagnosis Date Noted   Vitamin D deficiency, unspecified    Primary insomnia    Impaired fasting glucose    Hypothyroidism    Hypertensive heart disease with heart failure ([place])    Hypertension    Hyperlipidemia    Heart attack (HCC)    Chronic obstructive pulmonary disease (COPD) (HCC)    Chronic diastolic (congestive) heart failure ([place])    Cancer ([place])    A-fib ([place])    Atherosclerotic heart disease of native coronary artery without  angina pectoris    Screening for prostate cancer 02/06/2020   Blepharitis of left lower eyelid 12/02/2019   Hereditary thrombophilia ([employer]) 11/17/2019   Sequela of cardioembolic stroke 83/38/2505   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 11/17/2019   Chronic atrial fibrillation ([place]) 11/17/2019   Mixed hyperlipidemia 11/17/2019   Essential hypertension, benign 11/17/2019   CHF (congestive heart failure) ([place]) 11/05/2019   Squamous cell carcinoma 11/05/2019   Hypothyroidism (acquired) 04/24/2018   High cholesterol 10/20/2017   Class 1  obesity due to excess calories with serious comorbidity and body mass index (BMI) of 34.0 to 34.9 in adult 04/02/2017   Chest pain 04/02/2017   Obesity 04/02/2017   PAF (paroxysmal atrial fibrillation) ([place])    Stroke ([place]) 03/03/2017   Leg fatigue 03/03/2017   Hx of CABG 03/02/2017   Stroke (cerebrum) ([place]) 09/2016   Anticoagulated 01/21/2015   CAD in native artery 01/21/2015   Chronic diastolic heart failure ([place]) 01/21/2015   Hypertensive heart disease 01/21/2015   On amiodarone therapy 01/21/2015   Typical atrial flutter ([place]) 01/21/2015   Dyspnea 12/31/2014    Past Surgical History:  Procedure Laterality Date   APPENDECTOMY  1956   CARDIAC CATHETERIZATION     CARDIOVERSION  2002   CHOLECYSTECTOMY  2006   CORONARY ARTERY BYPASS GRAFT  2001   HERNIA REPAIR     RADIOLOGY WITH ANESTHESIA Bilateral 01/18/2018   Procedure: MRI WITH ANESTHESIA LUMBAR SPINE WITH AND WITHOUT CONTRAST;  Surgeon: Radiologist, Medication, MD;  Location: [facility];  Service: Radiology;  Laterality: Bilateral;   ROTATOR CUFF REPAIR Left    TONSILLECTOMY  1975       Family History  Problem Relation Age of Onset   Heart disease Father    Arrhythmia Father    Breast cancer Mother    Diabetes type II Sister    Diabetes Brother     Social History   Tobacco Use   Smoking status: Former    Packs/day: 1.00    Years: 15.00    Pack years: 15.00    Types: Cigarettes    Quit date: 08/29/1978    Years since quitting: 42.5   Smokeless tobacco: Never  Vaping Use   Vaping Use: Never used  Substance Use Topics   Alcohol use: No    Alcohol/week: 0.0 standard drinks   Drug use: No    Home Medications Prior to Admission medications   Medication Sig Start Date End Date Taking? Authorizing Provider  albuterol (PROVENTIL) (2.5 MG/3ML) 0.083% nebulizer solution Take 2.5 mg by nebulization every 6 (six) hours as needed for wheezing or shortness of breath.   Yes [provider]  amiodarone (PACERONE)  200 MG tablet TAKE 1 TABLET BY MOUTH EVERY DAY Patient taking differently: Take 200 mg by mouth daily. 08/17/20  Yes [name], MD  amLODipine (NORVASC) 5 MG tablet Take 1 tablet (5 mg total) by mouth daily. 12/21/20  Yes [name], MD  atorvastatin (LIPITOR) 10 MG tablet TAKE 1 TABLET BY MOUTH EVERYDAY AT BEDTIME Patient taking differently: Take 10 mg by mouth at bedtime. 12/21/20  Yes Cox, Kirsten, MD  carvedilol (COREG) 3.125 MG tablet TAKE 1 TABLET (3.125 MG TOTAL) BY MOUTH 2 (TWO) TIMES DAILY WITH A MEAL. 08/17/20  Yes [name], MD  donepezil (ARICEPT) 10 MG tablet Take 1 tablet (10 mg total) by mouth at bedtime. 12/07/20  Yes Cox, Kirsten, MD  ELIQUIS 5 MG TABS tablet TAKE 1 TABLET BY MOUTH  TWICE A DAY Patient taking differently: Take 5 mg by mouth 2 (two) times daily. 02/01/21  Yes Cox, Elnita Maxwell, MD  furosemide (LASIX) 40 MG tablet Take 1 tablet twice weekly on Wednesday and Saturday. Patient taking differently: Take 40 mg by mouth See admin instructions. Take 40 mg by mouth as needed for feet swelling 08/17/20  Yes [name], MD  Inulin (FIBER CHOICE PO) Take 3 tablets by mouth daily.   Yes [provider]  LAGEVRIO 200 MG CAPS Take 800 mg by mouth in the morning and at bedtime. 02/17/21 02/21/21 Yes [provider]  levothyroxine (SYNTHROID) 150 MCG tablet TAKE 1 TABLET BY MOUTH EVERY DAY Patient taking differently: 150 mcg daily before breakfast. 12/21/20  Yes Marge Duncans, PA-C  nitroGLYCERIN (NITROSTAT) 0.4 MG SL tablet Place 1 tablet (0.4 mg total) under the tongue every 5 (five) minutes as needed for chest pain. 10/29/18 02/18/21 Yes [name], MD  potassium chloride (K-DUR,KLOR-CON) 10 MEQ tablet Take 10 mEq by mouth See admin instructions. Take 10 mEq by mouth whenever you take furosemide   Yes [provider]  valsartan (DIOVAN) 320 MG tablet TAKE ONE TABLET BY MOUTH DAILY Patient taking differently: Take 320 mg by mouth daily. 12/21/20   Yes Cox, Kirsten, MD  zolpidem (AMBIEN) 10 MG tablet TAKE ONE TABLET BY MOUTH AT BEDTIME Patient taking differently: Take 5 mg by mouth at bedtime as needed for sleep. 11/09/20  Yes Cox, Kirsten, MD  aspirin 81 MG chewable tablet Chew by mouth. Patient not taking: No sig reported    [provider]  cloNIDine (CATAPRES) 0.1 MG tablet Take by mouth. Patient not taking: Reported on 02/18/2021    [provider]  ipratropium (ATROVENT) 0.02 % nebulizer solution Inhale into the lungs. Patient not taking: Reported on 02/18/2021    [provider]  Vitamin D, Ergocalciferol, (DRISDOL) 1.25 MG (50000 UNIT) CAPS capsule TAKE 1 CAPSULE (50,000 UNITS TOTAL) BY MOUTH EVERY SATURDAY. MORNING. 12/26/20   Marge Duncans, PA-C    Allergies    Avelox [moxifloxacin hcl], Cartia xt [diltiazem hcl], Pravachol [pravastatin sodium], Prednisone, and Zetia [ezetimibe]  Review of Systems   Review of Systems  All other systems reviewed and are negative.  Physical Exam Updated Vital Signs BP 124/71   Pulse 64   Temp 99.3 F (37.4 C) (Oral)   Resp (!) 24   SpO2 96%   Physical Exam Vitals and nursing note reviewed.  Constitutional:      Appearance: He is well-developed.  HENT:     Head: Normocephalic and atraumatic.  Cardiovascular:     Rate and Rhythm: Normal rate and regular rhythm.     Heart sounds: No murmur heard. Pulmonary:     Effort: Pulmonary effort is normal. No respiratory distress.     Breath sounds: Normal breath sounds.  Abdominal:     Palpations: Abdomen is soft.     Tenderness: There is no abdominal tenderness. There is no guarding or rebound.  Musculoskeletal:        General: No tenderness.     Comments: 2+ DP pulses bilaterally. There petechiae to the feet and distal legs bilaterally - patient states this is chronic.  Skin:    General: Skin is warm and dry.  Neurological:     Mental Status: He is alert and oriented to person, place, and time.     Comments:  Five out of five strength in all four extremities with sensation to light touch intact  in all four extremities. 2+ patellar reflexes bilaterally.  Psychiatric:        Behavior: Behavior normal.    ED Results / Procedures / Treatments   Labs (all labs ordered are listed, but only abnormal results are displayed) Labs Reviewed  CBC WITH DIFFERENTIAL/PLATELET - Abnormal; Notable for the following components:      Result Value   Lymphs Abs 0.4 (*)    All other components within normal limits  COMPREHENSIVE METABOLIC PANEL - Abnormal; Notable for the following components:   Total Bilirubin 1.8 (*)    All other components within normal limits  BRAIN NATRIURETIC PEPTIDE - Abnormal; Notable for the following components:   B Natriuretic Peptide 199.8 (*)    All other components within normal limits  PROTIME-INR - Abnormal; Notable for the following components:   Prothrombin Time 15.7 (*)    INR 1.3 (*)    All other components within normal limits    EKG EKG Interpretation  Date/Time:  Thursday February 18 2021 11:31:03 EDT Ventricular Rate:  57 PR Interval:  186 QRS Duration: 120 QT Interval:  412 QTC Calculation: 402 R Axis:   10 Text Interpretation: Sinus or ectopic atrial rhythm Nonspecific intraventricular conduction delay Confirmed by Quintella Reichert [phone]) on 02/18/2021 1:24:40 PM  Radiology DG Chest Portable 1 View  Result Date: 02/18/2021 CLINICAL DATA:  COVID positive yesterday, lower extremity weakness EXAM: PORTABLE CHEST 1 VIEW COMPARISON:  04/02/2017 chest radiograph. FINDINGS: Intact sternotomy wires. Stable cardiomediastinal silhouette with top-normal heart size. No pneumothorax. No pleural effusion. Lungs appear clear, with no acute consolidative airspace disease and no pulmonary edema. IMPRESSION: No active disease. Electronically Signed   By: Ilona Sorrel M.D.   On: 02/18/2021 08:52    Procedures Procedures   Medications Ordered in ED Medications  bebtelovimab EUA  injection SOLN 175 mg (175 mg Intravenous Given 02/18/21 1437)    ED Course  I have reviewed the triage vital signs and the nursing notes.  Pertinent labs & imaging results that were available during my care of the patient were reviewed by me and considered in my medical decision making (see chart for details).    MDM Rules/Calculators/A&P                         patient with history of coronary artery disease, CHF, a fib here for evaluation of weakness and inability to ambulate this morning in the setting of a diagnosis of COVID-19 yesterday. He started molnupivir yesterday. On evaluation in the department he is feeling improved. He has symmetric pulses and good strength throughout. Presentation is not consistent with CVA, dissection, sepsis, decompensated heart failure. Discussed with patient that symptoms may be secondary to the medication versus secondary to underlying COVID-19 infection. Patient does not feel comfortable continuing the medication as he is concerned it is contrary to his symptoms. Will discontinue the medication. Will give a one-time dose of monoclonal antibody treatment for his COVID-19. Feel he is stable for discharge home.   Final Clinical Impression(s) / ED Diagnoses Final diagnoses:  Malaise  Generalized weakness  COVID-19    Rx / DC Orders ED Discharge Orders     None        Quintella Reichert, MD 02/18/21 1552

## 2021-02-18 NOTE — ED Triage Notes (Signed)
Pt bib ems from home with reports of Covid+ yesterday. Today he awoke with bil leg weakness, states every time he tries to stand up his legs give out on him. Hx afib and CHF.  160/82 96% RA HR 80 RR 18

## 2021-02-18 NOTE — Discharge Instructions (Addendum)
Stopped taking the molnupiravir.  Get rechecked if you have new or concerning symptoms.

## 2021-02-18 NOTE — ED Notes (Signed)
JENNIFER LUCAS (DAUGHTER) [phone]), REQ UPDATE ON PT.

## 2021-02-22 NOTE — Progress Notes (Signed)
Chronic Care Management Pharmacy Assistant   Name: John Kemp  MRN: [id number] DOB: 1946/04/24  Hospital visits:  02/18/21-[place] ED, patient was seen and discharged.  Malaise and leg weakness form Covid  Medications: Outpatient Encounter Medications as of 02/03/2021  Medication Sig   amiodarone (PACERONE) 200 MG tablet TAKE 1 TABLET BY MOUTH EVERY DAY (Patient taking differently: Take 200 mg by mouth daily.)   amLODipine (NORVASC) 5 MG tablet Take 1 tablet (5 mg total) by mouth daily.   aspirin 81 MG chewable tablet Chew by mouth. (Patient not taking: No sig reported)   atorvastatin (LIPITOR) 10 MG tablet TAKE 1 TABLET BY MOUTH EVERYDAY AT BEDTIME (Patient taking differently: Take 10 mg by mouth at bedtime.)   carvedilol (COREG) 3.125 MG tablet TAKE 1 TABLET (3.125 MG TOTAL) BY MOUTH 2 (TWO) TIMES DAILY WITH A MEAL.   cloNIDine (CATAPRES) 0.1 MG tablet Take by mouth. (Patient not taking: Reported on 02/18/2021)   donepezil (ARICEPT) 10 MG tablet Take 1 tablet (10 mg total) by mouth at bedtime.   ELIQUIS 5 MG TABS tablet TAKE 1 TABLET BY MOUTH TWICE A DAY (Patient taking differently: Take 5 mg by mouth 2 (two) times daily.)   furosemide (LASIX) 40 MG tablet Take 1 tablet twice weekly on Wednesday and Saturday. (Patient taking differently: Take 40 mg by mouth See admin instructions. Take 40 mg by mouth as needed for feet swelling)   Inulin (FIBER CHOICE PO) Take 3 tablets by mouth daily.   ipratropium (ATROVENT) 0.02 % nebulizer solution Inhale into the lungs. (Patient not taking: Reported on 02/18/2021)   levothyroxine (SYNTHROID) 150 MCG tablet TAKE 1 TABLET BY MOUTH EVERY DAY (Patient taking differently: 150 mcg daily before breakfast.)   nitroGLYCERIN (NITROSTAT) 0.4 MG SL tablet Place 1 tablet (0.4 mg total) under the tongue every 5 (five) minutes as needed for chest pain.   potassium chloride (K-DUR,KLOR-CON) 10 MEQ tablet Take 10 mEq by mouth See admin instructions. Take 10 mEq  by mouth whenever you take furosemide   valsartan (DIOVAN) 320 MG tablet TAKE ONE TABLET BY MOUTH DAILY (Patient taking differently: Take 320 mg by mouth daily.)   Vitamin D, Ergocalciferol, (DRISDOL) 1.25 MG (50000 UNIT) CAPS capsule TAKE 1 CAPSULE (50,000 UNITS TOTAL) BY MOUTH EVERY SATURDAY. MORNING.   zolpidem (AMBIEN) 10 MG tablet TAKE ONE TABLET BY MOUTH AT BEDTIME (Patient taking differently: Take 5 mg by mouth at bedtime as needed for sleep.)   No facility-administered encounter medications on file as of 02/03/2021.  02/18/21-[facility] ED, patient was seen and discharged.  Was seen at urgent care earlier in week, tested positive for Covid, was given oral medication from Urgent Care, Patient not sure what it was. Patient is complaining today of weakness in legs, having trouble standing and Malaise.   ER doctor ordered EKG,  patient was given an  injection of Bebtelovimab medication.  Per ED notes Stopped taking the molnupiravir.   02/24/21-Spoke to patient.  He stated the injection helped as well as the antibody infusion he had done at [facility].  He stated he and his wife both have a horrible cough, he stated she was diagnosed with pneumonia.  He thinks he may have pneumonia as well, he stated his sputum is clear to white in color.   I encouraged him to call the office and talk to triage, to see if they will schedule a tele-visit to help with his cough.  He agreed to call.  He  will notify us if needed with any changes.    Clarita Leber, [place] Pharmacist Assistant [phone]

## 2021-02-25 ENCOUNTER — Ambulatory Visit (INDEPENDENT_AMBULATORY_CARE_PROVIDER_SITE_OTHER): Payer: Medicare Other | Admitting: Family Medicine

## 2021-02-25 VITALS — BP 136/70 | HR 52 | Temp 97.5°F | Resp 18 | Wt 243.2 lb

## 2021-02-25 DIAGNOSIS — J208 Acute bronchitis due to other specified organisms: Secondary | ICD-10-CM | POA: Diagnosis not present

## 2021-02-25 DIAGNOSIS — U071 COVID-19: Secondary | ICD-10-CM

## 2021-02-25 NOTE — Progress Notes (Signed)
Acute Office Visit  Subjective:    Patient ID: John Kemp, male    DOB: [birth year], 75 y.o.   MRN: [id number]  Chief Complaint  Patient presents with   post covid    HPI Patient is in today for hospital follow-up for COVID-19.  Patient had severe weakness and malaise had to be admitted for time IV fluids and COVID-19 treatment.  He was admitted on February 18, 2021.  He is feeling much better.  His shortness of breath and cough have improved.  Patient was given a one-time dose of monoclonal antibody and discharged home from the emergency department he had already started on Molnupiravir 1 day prior to admission. His appetite is improved.  He is drinking fluids.  Past Medical History:  Diagnosis Date   A-fib ([place])    Anticoagulated 01/21/2015   Atherosclerotic heart disease of native coronary artery without angina pectoris    Blepharitis of left lower eyelid 12/02/2019   CAD in native artery 01/21/2015   Cancer ([place])    skin cancer   Chest pain 04/02/2017   CHF (congestive heart failure) ([place]) 11/05/2019   Chronic diastolic (congestive) heart failure (HCC)    Chronic diastolic heart failure ([place]) 01/21/2015   Last Assessment & Plan:  Is congestive heart failure is mildly decompensated he is edematous he'll increase the dose of his diuretic today the lab work performed including an ARB referred to care transitions for home care he'll follow-up in my office with me in 4-6 weeks daily weights sodium restrictionand compliance of medications beinghemoglobin of will   Chronic obstructive pulmonary disease (COPD) ([place])    Dyspnea 12/31/2014   Followed in Pulmonary clinic/ [facility]/ [facility]  - amiodarone rx 11/2014 >>> - PFTs rec 12/31/2014     Heart attack ([place])    High cholesterol 10/20/2017   Hx of CABG 03/02/2017   Hyperlipidemia    Hypertension    Hypertensive heart disease 01/21/2015   Hypertensive heart disease with heart failure (HCC)    Hypothyroidism    Hypothyroidism (acquired)  04/24/2018   Impaired fasting glucose    Leg fatigue 03/03/2017   Obesity 04/02/2017   On amiodarone therapy 01/21/2015   PAF (paroxysmal atrial fibrillation) ([place])    Primary insomnia    Squamous cell carcinoma 11/05/2019   Stroke (cerebrum) ([place]) 09/2016   right superior quadrantanopsia   Stroke ([place])    Typical atrial flutter ([place]) 01/21/2015   Vitamin D deficiency, unspecified     Past Surgical History:  Procedure Laterality Date   APPENDECTOMY  1956   CARDIAC CATHETERIZATION     CARDIOVERSION  2002   CHOLECYSTECTOMY  2006   CORONARY ARTERY BYPASS GRAFT  2001   HERNIA REPAIR     RADIOLOGY WITH ANESTHESIA Bilateral 01/18/2018   Procedure: MRI WITH ANESTHESIA LUMBAR SPINE WITH AND WITHOUT CONTRAST;  Surgeon: Radiologist, Medication, MD;  Location: [facility];  Service: Radiology;  Laterality: Bilateral;   ROTATOR CUFF REPAIR Left    TONSILLECTOMY  1975    Family History  Problem Relation Age of Onset   Heart disease Father    Arrhythmia Father    Breast cancer Mother    Diabetes type II Sister    Diabetes Brother     Social History   Socioeconomic History   Marital status: Married    Spouse name: Not on file   Number of children: Not on file   Years of education: Not on file   Highest education level: Not  on file  Occupational History   Occupation: Retired  Tobacco Use   Smoking status: Former    Packs/day: 1.00    Years: 15.00    Pack years: 15.00    Types: Cigarettes    Quit date: 08/29/1978    Years since quitting: 42.5   Smokeless tobacco: Never  Vaping Use   Vaping Use: Never used  Substance and Sexual Activity   Alcohol use: No    Alcohol/week: 0.0 standard drinks   Drug use: No   Sexual activity: Not Currently  Other Topics Concern   Not on file  Social History Narrative   Not on file   Social Determinants of Health   Financial Resource Strain: Not on file  Food Insecurity: No Food Insecurity   Worried About Charity fundraiser in the Last Year: Never  true   Ran Out of Food in the Last Year: Never true  Transportation Needs: Not on file  Physical Activity: Not on file  Stress: Not on file  Social Connections: Not on file  Intimate Partner Violence: Not on file    Outpatient Medications Prior to Visit  Medication Sig Dispense Refill   albuterol (PROVENTIL) (2.5 MG/3ML) 0.083% nebulizer solution Take 2.5 mg by nebulization every 6 (six) hours as needed for wheezing or shortness of breath.     amiodarone (PACERONE) 200 MG tablet TAKE 1 TABLET BY MOUTH EVERY DAY (Patient taking differently: Take 200 mg by mouth daily.) 90 tablet 2   amLODipine (NORVASC) 5 MG tablet Take 1 tablet (5 mg total) by mouth daily. 90 tablet 2   aspirin 81 MG chewable tablet Chew by mouth. (Patient not taking: No sig reported)     atorvastatin (LIPITOR) 10 MG tablet TAKE 1 TABLET BY MOUTH EVERYDAY AT BEDTIME (Patient taking differently: Take 10 mg by mouth at bedtime.) 90 tablet 0   carvedilol (COREG) 3.125 MG tablet TAKE 1 TABLET (3.125 MG TOTAL) BY MOUTH 2 (TWO) TIMES DAILY WITH A MEAL. 180 tablet 2   cloNIDine (CATAPRES) 0.1 MG tablet Take by mouth. (Patient not taking: Reported on 02/18/2021)     donepezil (ARICEPT) 10 MG tablet Take 1 tablet (10 mg total) by mouth at bedtime. 90 tablet 1   ELIQUIS 5 MG TABS tablet TAKE 1 TABLET BY MOUTH TWICE A DAY (Patient taking differently: Take 5 mg by mouth 2 (two) times daily.) 180 tablet 1   furosemide (LASIX) 40 MG tablet Take 1 tablet twice weekly on Wednesday and Saturday. (Patient taking differently: Take 40 mg by mouth See admin instructions. Take 40 mg by mouth as needed for feet swelling) 24 tablet 3   Inulin (FIBER CHOICE PO) Take 3 tablets by mouth daily.     levothyroxine (SYNTHROID) 150 MCG tablet TAKE 1 TABLET BY MOUTH EVERY DAY (Patient taking differently: 150 mcg daily before breakfast.) 90 tablet 0   nitroGLYCERIN (NITROSTAT) 0.4 MG SL tablet Place 1 tablet (0.4 mg total) under the tongue every 5 (five)  minutes as needed for chest pain. 25 tablet 5   potassium chloride (K-DUR,KLOR-CON) 10 MEQ tablet Take 10 mEq by mouth See admin instructions. Take 10 mEq by mouth whenever you take furosemide     valsartan (DIOVAN) 320 MG tablet TAKE ONE TABLET BY MOUTH DAILY (Patient taking differently: Take 320 mg by mouth daily.) 90 tablet 3   Vitamin D, Ergocalciferol, (DRISDOL) 1.25 MG (50000 UNIT) CAPS capsule TAKE 1 CAPSULE (50,000 UNITS TOTAL) BY MOUTH EVERY SATURDAY. MORNING. 12 capsule 0  ipratropium (ATROVENT) 0.02 % nebulizer solution Inhale into the lungs. (Patient not taking: Reported on 02/18/2021)     zolpidem (AMBIEN) 10 MG tablet TAKE ONE TABLET BY MOUTH AT BEDTIME (Patient taking differently: Take 5 mg by mouth at bedtime as needed for sleep.) 30 tablet 3   No facility-administered medications prior to visit.    Allergies  Allergen Reactions   Avelox [Moxifloxacin Hcl]    Cartia Xt [Diltiazem Hcl] Nausea And Vomiting   Pravachol [Pravastatin Sodium] Other (See Comments)    myalgia   Prednisone Other (See Comments)    "makes me feel terrible"   Zetia [Ezetimibe] Other (See Comments)    myalgias    Review of Systems  Constitutional:  Negative for chills and fever.  HENT:  Positive for congestion and sinus pressure. Negative for rhinorrhea and sore throat.   Respiratory:  Positive for cough, shortness of breath and wheezing.   Cardiovascular:  Negative for chest pain and palpitations.  Gastrointestinal:  Negative for abdominal pain, constipation, diarrhea, nausea and vomiting.  Genitourinary:  Negative for dysuria and urgency.  Musculoskeletal:  Negative for arthralgias, back pain and myalgias.  Neurological:  Positive for weakness. Negative for dizziness and headaches.       Balance issues   Psychiatric/Behavioral:  Negative for dysphoric mood. The patient is not nervous/anxious.       Objective:    Physical Exam Vitals reviewed.  Constitutional:      Appearance: Normal  appearance.  Cardiovascular:     Rate and Rhythm: Normal rate. Rhythm irregular.     Heart sounds: Normal heart sounds.  Pulmonary:     Effort: Pulmonary effort is normal.     Breath sounds: Normal breath sounds. No wheezing, rhonchi or rales.  Abdominal:     General: Bowel sounds are normal.     Palpations: Abdomen is soft.     Tenderness: There is no abdominal tenderness.  Skin:    General: Skin is warm and dry.  Neurological:     Mental Status: He is alert and oriented to person, place, and time.  Psychiatric:        Mood and Affect: Mood normal.        Behavior: Behavior normal.    BP 136/70 (BP Location: Left Arm)   Pulse (!) 52   Temp (!) 97.5 F (36.4 C)   Resp 18   Wt 243 lb 3.2 oz (110.3 kg)   BMI 34.90 kg/m  Wt Readings from Last 3 Encounters:  02/25/21 243 lb 3.2 oz (110.3 kg)  12/22/20 256 lb (116.1 kg)  11/12/20 256 lb (116.1 kg)    Health Maintenance Due  Topic Date Due   Hepatitis C Screening  Never done   Zoster Vaccines- Shingrix (1 of 2) Never done    There are no preventive care reminders to display for this patient.   Lab Results  Component Value Date   TSH 2.050 12/22/2020   Lab Results  Component Value Date   WBC 5.4 02/18/2021   HGB 15.0 02/18/2021   HCT 44.7 02/18/2021   MCV 90.9 02/18/2021   PLT 171 02/18/2021   Lab Results  Component Value Date   NA 135 02/18/2021   K 3.9 02/18/2021   CO2 25 02/18/2021   GLUCOSE 97 02/18/2021   BUN 8 02/18/2021   CREATININE 1.18 02/18/2021   BILITOT 1.8 (H) 02/18/2021   ALKPHOS 80 02/18/2021   AST 27 02/18/2021   ALT 20 02/18/2021   PROT 7.3  02/18/2021   ALBUMIN 4.1 02/18/2021   CALCIUM 9.2 02/18/2021   ANIONGAP 11 02/18/2021   EGFR 58 (L) 12/22/2020   Lab Results  Component Value Date   CHOL 140 12/22/2020   Lab Results  Component Value Date   HDL 35 (L) 12/22/2020   Lab Results  Component Value Date   LDLCALC 83 12/22/2020   Lab Results  Component Value Date   TRIG 118  12/22/2020   Lab Results  Component Value Date   CHOLHDL 4.0 12/22/2020   Lab Results  Component Value Date   HGBA1C 5.3 05/22/2020       Assessment & Plan:  1. Acute bronchitis due to COVID-19 virus  Patient is feeling much better.  May continue cough medicine.  Recommend fluids and 3 meals a day.  Follow-up: Return if symptoms worsen or fail to improve.  An After Visit Summary was printed and given to the patient.  [name], MD [facility] [phone]

## 2021-03-04 ENCOUNTER — Ambulatory Visit: Payer: Medicare Other

## 2021-03-04 ENCOUNTER — Other Ambulatory Visit: Payer: Self-pay

## 2021-03-04 ENCOUNTER — Other Ambulatory Visit: Payer: Self-pay | Admitting: Family Medicine

## 2021-03-04 DIAGNOSIS — J410 Simple chronic bronchitis: Secondary | ICD-10-CM

## 2021-03-04 DIAGNOSIS — H6123 Impacted cerumen, bilateral: Secondary | ICD-10-CM

## 2021-03-04 MED ORDER — IPRATROPIUM BROMIDE 0.02 % IN SOLN: 0.5000 mg | Freq: Four times a day (QID) | RESPIRATORY_TRACT | 2 refills | Status: DC | PRN

## 2021-03-04 NOTE — Progress Notes (Signed)
Ceruminosis is noted.  Wax is removed by syringing and manual debridement. Instructions for home care to prevent wax buildup are given.  

## 2021-03-19 ENCOUNTER — Encounter: Payer: Self-pay | Admitting: Family Medicine

## 2021-04-02 ENCOUNTER — Other Ambulatory Visit: Payer: Self-pay | Admitting: Physician Assistant

## 2021-04-05 ENCOUNTER — Ambulatory Visit (INDEPENDENT_AMBULATORY_CARE_PROVIDER_SITE_OTHER): Payer: Medicare Other | Admitting: Family Medicine

## 2021-04-05 ENCOUNTER — Other Ambulatory Visit: Payer: Self-pay

## 2021-04-05 VITALS — BP 124/60 | HR 51 | Temp 96.7°F | Ht 70.0 in | Wt 238.0 lb

## 2021-04-05 DIAGNOSIS — N3000 Acute cystitis without hematuria: Secondary | ICD-10-CM

## 2021-04-05 DIAGNOSIS — R39198 Other difficulties with micturition: Secondary | ICD-10-CM

## 2021-04-05 LAB — POCT URINALYSIS DIPSTICK
Blood, UA: NEGATIVE
Glucose, UA: NEGATIVE
Ketones, UA: POSITIVE
Nitrite, UA: NEGATIVE
Protein, UA: POSITIVE — AB
Spec Grav, UA: >=1.03 — AB (ref 1.010–1.025)
Urobilinogen, UA: 1 E.U./dL
pH, UA: 5.5 (ref 5.0–8.0)

## 2021-04-05 MED ORDER — NITROFURANTOIN MONOHYD MACRO 100 MG PO CAPS: 100.0000 mg | ORAL_CAPSULE | Freq: Two times a day (BID) | ORAL | 0 refills | Status: DC

## 2021-04-05 MED ORDER — ZOLPIDEM TARTRATE 10 MG PO TABS
10.0000 mg | ORAL_TABLET | Freq: Every evening | ORAL | 1 refills | Status: DC | PRN
Start: 2021-04-05 — End: 2021-09-30

## 2021-04-05 NOTE — Progress Notes (Signed)
Acute Office Visit  Subjective:    Patient ID: John Kemp, male    DOB: [birth year], 75 y.o.   MRN: [id number]  Chief Complaint  Patient presents with   Urinary Frequency     HPI Patient is in today for urinary symptoms which started a week ago, has not taken any medication for this. Polyuria, low volume. Urgency with some urge incontinence.   Also, states he needs something for sleep. Ran out of Ambien Friday and slept horribly last night. He reports he is unsure if he wants a refill or needs something different for sleep despite having no issues or complaints with his current regimen.   Past Medical History:  Diagnosis Date   A-fib ([place])    Anticoagulated 01/21/2015   Atherosclerotic heart disease of native coronary artery without angina pectoris    Blepharitis of left lower eyelid 12/02/2019   CAD in native artery 01/21/2015   Cancer ([place])    skin cancer   Chest pain 04/02/2017   CHF (congestive heart failure) ([place]) 11/05/2019   Chronic diastolic (congestive) heart failure (HCC)    Chronic diastolic heart failure ([place]) 01/21/2015   Last Assessment & Plan:  Is congestive heart failure is mildly decompensated he is edematous he'll increase the dose of his diuretic today the lab work performed including an ARB referred to care transitions for home care he'll follow-up in my office with me in 4-6 weeks daily weights sodium restrictionand compliance of medications beinghemoglobin of will   Chronic obstructive pulmonary disease (COPD) ([place])    Dyspnea 12/31/2014   Followed in Pulmonary clinic/ [facility]/ [facility]  - amiodarone rx 11/2014 >>> - PFTs rec 12/31/2014     Heart attack ([place])    High cholesterol 10/20/2017   Hx of CABG 03/02/2017   Hyperlipidemia    Hypertension    Hypertensive heart disease 01/21/2015   Hypertensive heart disease with heart failure (HCC)    Hypothyroidism    Hypothyroidism (acquired) 04/24/2018   Impaired fasting glucose    Leg fatigue 03/03/2017    Obesity 04/02/2017   On amiodarone therapy 01/21/2015   PAF (paroxysmal atrial fibrillation) ([place])    Primary insomnia    Squamous cell carcinoma 11/05/2019   Stroke (cerebrum) ([place]) 09/2016   right superior quadrantanopsia   Stroke ([place])    Typical atrial flutter ([place]) 01/21/2015   Vitamin D deficiency, unspecified     Past Surgical History:  Procedure Laterality Date   APPENDECTOMY  1956   CARDIAC CATHETERIZATION     CARDIOVERSION  2002   CHOLECYSTECTOMY  2006   CORONARY ARTERY BYPASS GRAFT  2001   HERNIA REPAIR     RADIOLOGY WITH ANESTHESIA Bilateral 01/18/2018   Procedure: MRI WITH ANESTHESIA LUMBAR SPINE WITH AND WITHOUT CONTRAST;  Surgeon: Radiologist, Medication, MD;  Location: [facility];  Service: Radiology;  Laterality: Bilateral;   ROTATOR CUFF REPAIR Left    TONSILLECTOMY  1975    Family History  Problem Relation Age of Onset   Heart disease Father    Arrhythmia Father    Breast cancer Mother    Diabetes type II Sister    Diabetes Brother     Social History   Socioeconomic History   Marital status: Married    Spouse name: Not on file   Number of children: Not on file   Years of education: Not on file   Highest education level: Not on file  Occupational History   Occupation: Retired  Tobacco Use  Smoking status: Former    Packs/day: 1.00    Years: 15.00    Pack years: 15.00    Types: Cigarettes    Quit date: 08/29/1978    Years since quitting: 42.6   Smokeless tobacco: Never  Vaping Use   Vaping Use: Never used  Substance and Sexual Activity   Alcohol use: No    Alcohol/week: 0.0 standard drinks   Drug use: No   Sexual activity: Not Currently  Other Topics Concern   Not on file  Social History Narrative   Not on file   Social Determinants of Health   Financial Resource Strain: Not on file  Food Insecurity: No Food Insecurity   Worried About Charity fundraiser in the Last Year: Never true   Ran Out of Food in the Last Year: Never true   Transportation Needs: Not on file  Physical Activity: Not on file  Stress: Not on file  Social Connections: Not on file  Intimate Partner Violence: Not on file    Outpatient Medications Prior to Visit  Medication Sig Dispense Refill   albuterol (PROVENTIL) (2.5 MG/3ML) 0.083% nebulizer solution Take 2.5 mg by nebulization every 6 (six) hours as needed for wheezing or shortness of breath.     amLODipine (NORVASC) 5 MG tablet Take 1 tablet (5 mg total) by mouth daily. 90 tablet 2   aspirin 81 MG chewable tablet Chew by mouth. (Patient not taking: No sig reported)     atorvastatin (LIPITOR) 10 MG tablet TAKE 1 TABLET BY MOUTH EVERYDAY AT BEDTIME (Patient taking differently: Take 10 mg by mouth at bedtime.) 90 tablet 0   cloNIDine (CATAPRES) 0.1 MG tablet Take by mouth. (Patient not taking: Reported on 02/18/2021)     ELIQUIS 5 MG TABS tablet TAKE 1 TABLET BY MOUTH TWICE A DAY (Patient taking differently: Take 5 mg by mouth 2 (two) times daily.) 180 tablet 1   furosemide (LASIX) 40 MG tablet Take 1 tablet twice weekly on Wednesday and Saturday. (Patient taking differently: Take 40 mg by mouth See admin instructions. Take 40 mg by mouth as needed for feet swelling) 24 tablet 3   Inulin (FIBER CHOICE PO) Take 3 tablets by mouth daily.     ipratropium (ATROVENT) 0.02 % nebulizer solution Inhale 2.5 mLs (0.5 mg total) into the lungs every 6 (six) hours as needed for wheezing or shortness of breath. 75 mL 2   levothyroxine (SYNTHROID) 150 MCG tablet TAKE 1 TABLET BY MOUTH EVERY DAY (Patient taking differently: 150 mcg daily before breakfast.) 90 tablet 0   nitroGLYCERIN (NITROSTAT) 0.4 MG SL tablet Place 1 tablet (0.4 mg total) under the tongue every 5 (five) minutes as needed for chest pain. 25 tablet 5   potassium chloride (K-DUR,KLOR-CON) 10 MEQ tablet Take 10 mEq by mouth See admin instructions. Take 10 mEq by mouth whenever you take furosemide     valsartan (DIOVAN) 320 MG tablet TAKE ONE TABLET BY  MOUTH DAILY (Patient taking differently: Take 320 mg by mouth daily.) 90 tablet 3   Vitamin D, Ergocalciferol, (DRISDOL) 1.25 MG (50000 UNIT) CAPS capsule TAKE 1 CAPSULE (50,000 UNITS TOTAL) BY MOUTH EVERY SATURDAY. MORNING. 12 capsule 0   zolpidem (AMBIEN) 10 MG tablet TAKE ONE TABLET BY MOUTH AT BEDTIME 30 tablet 0   amiodarone (PACERONE) 200 MG tablet TAKE 1 TABLET BY MOUTH EVERY DAY (Patient taking differently: Take 200 mg by mouth daily.) 90 tablet 2   carvedilol (COREG) 3.125 MG tablet TAKE 1 TABLET (  3.125 MG TOTAL) BY MOUTH 2 (TWO) TIMES DAILY WITH A MEAL. 180 tablet 2   donepezil (ARICEPT) 10 MG tablet Take 1 tablet (10 mg total) by mouth at bedtime. 90 tablet 1   No facility-administered medications prior to visit.    Allergies  Allergen Reactions   Avelox [Moxifloxacin Hcl]    Cartia Xt [Diltiazem Hcl] Nausea And Vomiting   Pravachol [Pravastatin Sodium] Other (See Comments)    myalgia   Prednisone Other (See Comments)    "makes me feel terrible"   Zetia [Ezetimibe] Other (See Comments)    myalgias    Review of Systems  Constitutional:  Negative for chills and fever.  HENT:  Positive for congestion and rhinorrhea.   Respiratory:  Negative for cough and shortness of breath.   Cardiovascular:  Negative for chest pain.  Gastrointestinal:  Negative for abdominal pain and nausea.  Genitourinary:  Positive for difficulty urinating, frequency and urgency. Negative for dysuria, flank pain and hematuria.  Musculoskeletal:  Positive for back pain.      Objective:    Physical Exam Vitals reviewed.  Constitutional:      Appearance: Normal appearance.  Neck:     Vascular: No carotid bruit.  Cardiovascular:     Rate and Rhythm: Normal rate. Rhythm irregular.     Pulses: Normal pulses.     Heart sounds: Normal heart sounds.  Pulmonary:     Effort: Pulmonary effort is normal.     Breath sounds: Normal breath sounds. No wheezing, rhonchi or rales.  Abdominal:     General:  Bowel sounds are normal.     Palpations: Abdomen is soft.     Tenderness: There is no abdominal tenderness.  Neurological:     Mental Status: He is alert and oriented to person, place, and time.  Psychiatric:        Mood and Affect: Mood normal.        Behavior: Behavior normal.    BP 124/60   Pulse (!) 51   Temp (!) 96.7 F (35.9 C)   Ht 5' 10" (1.778 m)   Wt 238 lb (108 kg)   SpO2 99%   BMI 34.15 kg/m  Wt Readings from Last 3 Encounters:  04/05/21 238 lb (108 kg)  02/25/21 243 lb 3.2 oz (110.3 kg)  12/22/20 256 lb (116.1 kg)    Health Maintenance Due  Topic Date Due   Hepatitis C Screening  Never done   Zoster Vaccines- Shingrix (1 of 2) Never done   INFLUENZA VACCINE  03/29/2021    There are no preventive care reminders to display for this patient.   Lab Results  Component Value Date   TSH 2.050 12/22/2020   Lab Results  Component Value Date   WBC 5.4 02/18/2021   HGB 15.0 02/18/2021   HCT 44.7 02/18/2021   MCV 90.9 02/18/2021   PLT 171 02/18/2021   Lab Results  Component Value Date   NA 135 02/18/2021   K 3.9 02/18/2021   CO2 25 02/18/2021   GLUCOSE 97 02/18/2021   BUN 8 02/18/2021   CREATININE 1.18 02/18/2021   BILITOT 1.8 (H) 02/18/2021   ALKPHOS 80 02/18/2021   AST 27 02/18/2021   ALT 20 02/18/2021   PROT 7.3 02/18/2021   ALBUMIN 4.1 02/18/2021   CALCIUM 9.2 02/18/2021   ANIONGAP 11 02/18/2021   EGFR 58 (L) 12/22/2020   Lab Results  Component Value Date   CHOL 140 12/22/2020   Lab Results  Component  Value Date   HDL 35 (L) 12/22/2020   Lab Results  Component Value Date   LDLCALC 83 12/22/2020   Lab Results  Component Value Date   TRIG 118 12/22/2020   Lab Results  Component Value Date   CHOLHDL 4.0 12/22/2020   Lab Results  Component Value Date   HGBA1C 5.3 05/22/2020       Assessment & Plan:   1. Acute cystitis without hematuria - POCT urinalysis dipstick - Urine Culture Rx for macrobid given.      Meds  ordered this encounter  Medications   zolpidem (AMBIEN) 10 MG tablet    Sig: Take 1 tablet (10 mg total) by mouth at bedtime as needed for sleep.    Dispense:  90 tablet    Refill:  1   nitrofurantoin, macrocrystal-monohydrate, (MACROBID) 100 MG capsule    Sig: Take 1 capsule (100 mg total) by mouth 2 (two) times daily.    Dispense:  14 capsule    Refill:  0      [name], MD

## 2021-04-06 ENCOUNTER — Other Ambulatory Visit: Payer: Self-pay | Admitting: Cardiology

## 2021-04-06 ENCOUNTER — Other Ambulatory Visit: Payer: Self-pay | Admitting: Family Medicine

## 2021-04-06 DIAGNOSIS — I5032 Chronic diastolic (congestive) heart failure: Secondary | ICD-10-CM

## 2021-04-07 ENCOUNTER — Encounter: Payer: Self-pay | Admitting: Family Medicine

## 2021-04-07 LAB — URINE CULTURE

## 2021-04-08 ENCOUNTER — Telehealth: Payer: Self-pay

## 2021-04-08 NOTE — Chronic Care Management (AMB) (Signed)
Chronic Care Management Pharmacy Assistant   Name: Darnell Tschirhart  MRN: [id number] DOB: 04/03/1946   Reason for Encounter: Disease State/ Hypertension  Recent office visits:  02-25-2021 [name], [facility] follow up.   03-04-2021 Effie Shy, RN. Ear wax build up.  04-05-2021 [name], MD. STOP Clonidine 0.1 mg. STOP Aspirin 81 mg. CHANGE Ambien 10 mg nightly TO as needed. START Nitrofurantoin 100 mg twice daily for one week. Spec Grav= >=1.030, Protein= positive, Leuko= small.  Recent consult visits:  None  Hospital visits:  Medication Reconciliation was completed by comparing discharge summary, patient's EMR and Pharmacy list, and upon discussion with patient.  Admitted to the hospital on 02-18-2021 due to Covid. Discharge date was 02-18-2021. Discharged from [facility]?Medications Started at [facility] Discharge:?? None  Medication Changes at Hospital Discharge: None  Medications Discontinued at Hospital Discharge: None  Medications that remain the same after Hospital Discharge:??  -All other medications will remain the same.    Medications: Outpatient Encounter Medications as of 04/08/2021  Medication Sig   albuterol (PROVENTIL) (2.5 MG/3ML) 0.083% nebulizer solution Take 2.5 mg by nebulization every 6 (six) hours as needed for wheezing or shortness of breath.   amiodarone (PACERONE) 200 MG tablet TAKE 1 TABLET BY MOUTH EVERY DAY   amLODipine (NORVASC) 5 MG tablet Take 1 tablet (5 mg total) by mouth daily.   atorvastatin (LIPITOR) 10 MG tablet TAKE 1 TABLET BY MOUTH EVERYDAY AT BEDTIME (Patient taking differently: Take 10 mg by mouth at bedtime.)   carvedilol (COREG) 3.125 MG tablet TAKE 1 TABLET (3.125 MG TOTAL) BY MOUTH 2 (TWO) TIMES DAILY WITH A MEAL.   donepezil (ARICEPT) 10 MG tablet TAKE 1 TABLET BY MOUTH EVERYDAY AT BEDTIME   ELIQUIS 5 MG TABS tablet TAKE 1 TABLET BY MOUTH TWICE A DAY (Patient taking differently: Take 5  mg by mouth 2 (two) times daily.)   furosemide (LASIX) 40 MG tablet Take 1 tablet twice weekly on Wednesday and Saturday. (Patient taking differently: Take 40 mg by mouth See admin instructions. Take 40 mg by mouth as needed for feet swelling)   Inulin (FIBER CHOICE PO) Take 3 tablets by mouth daily.   ipratropium (ATROVENT) 0.02 % nebulizer solution Inhale 2.5 mLs (0.5 mg total) into the lungs every 6 (six) hours as needed for wheezing or shortness of breath.   levothyroxine (SYNTHROID) 150 MCG tablet TAKE 1 TABLET BY MOUTH EVERY DAY (Patient taking differently: 150 mcg daily before breakfast.)   nitrofurantoin, macrocrystal-monohydrate, (MACROBID) 100 MG capsule Take 1 capsule (100 mg total) by mouth 2 (two) times daily.   nitroGLYCERIN (NITROSTAT) 0.4 MG SL tablet Place 1 tablet (0.4 mg total) under the tongue every 5 (five) minutes as needed for chest pain.   potassium chloride (K-DUR,KLOR-CON) 10 MEQ tablet Take 10 mEq by mouth See admin instructions. Take 10 mEq by mouth whenever you take furosemide   valsartan (DIOVAN) 320 MG tablet TAKE ONE TABLET BY MOUTH DAILY (Patient taking differently: Take 320 mg by mouth daily.)   Vitamin D, Ergocalciferol, (DRISDOL) 1.25 MG (50000 UNIT) CAPS capsule TAKE 1 CAPSULE (50,000 UNITS TOTAL) BY MOUTH EVERY SATURDAY. MORNING.   zolpidem (AMBIEN) 10 MG tablet TAKE ONE TABLET BY MOUTH AT BEDTIME   zolpidem (AMBIEN) 10 MG tablet Take 1 tablet (10 mg total) by mouth at bedtime as needed for sleep.   No facility-administered encounter medications on file as of 04/08/2021.     Recent Office  Vitals: BP Readings from Last 3 Encounters:  04/05/21 124/60  02/25/21 136/70  02/18/21 124/71   Pulse Readings from Last 3 Encounters:  04/05/21 (!) 51  02/25/21 (!) 52  02/18/21 64    Wt Readings from Last 3 Encounters:  04/05/21 238 lb (108 kg)  02/25/21 243 lb 3.2 oz (110.3 kg)  12/22/20 256 lb (116.1 kg)     Kidney Function Lab Results  Component Value  Date/Time   CREATININE 1.18 02/18/2021 08:11 AM   CREATININE 1.30 (H) 12/22/2020 10:02 AM   GFRNONAA >60 02/18/2021 08:11 AM   GFRAA 54 (L) 09/21/2020 10:05 AM    BMP Latest Ref Rng & Units 02/18/2021 12/22/2020 09/21/2020  Glucose 70 - 99 mg/dL 97 94 96  BUN 8 - 23 mg/dL '8 12 16  '$ Creatinine 0.61 - 1.24 mg/dL 1.18 1.30(H) 1.47(H)  BUN/Creat Ratio 10 - 24 - 9(L) 11  Sodium 135 - 145 mmol/L 135 140 139  Potassium 3.5 - 5.1 mmol/L 3.9 4.2 4.5  Chloride 98 - 111 mmol/L 99 100 101  CO2 22 - 32 mmol/L '25 26 26  '$ Calcium 8.9 - 10.3 mg/dL 9.2 9.4 9.5     Current antihypertensive regimen:  Amlodipine 5 mg bid Carvedilol 3.125 mg bid with a meal Furosemide 40 mg twice weekly Valsartan 320 mg daily Potassium 10 meq daily prn    Patient verbally confirms he is taking the above medications as directed. Yes  How often are you checking your Blood Pressure? weekly  he checks his blood pressure in the afternoon after taking his medication.  Current home BP readings: Patient states he hasn't checked blood pressure since last office visit on 04-05-2021.  DATE:             BP               PULSE  04-05-2021 124/60 51   Wrist or arm cuff: both Caffeine intake: drinks 2 12 oz diet pepsi weekly Salt intake: doesn't use salt OTC medications including pseudoephedrine or NSAIDs? None  Any readings above 180/120? No  What recent interventions/DTPs have been made by any provider to improve Blood Pressure control since last CPP Visit:  Educated on BP goals and benefits of medications for prevention of heart attack, stroke and kidney damage Daily salt intake goal < 2300 mg Exercise goal of 150 minutes per week Importance of home blood pressure monitoring  Any recent hospitalizations or ED visits since last visit with CPP? Yes  What diet changes have been made to improve Blood Pressure Control?  Patient states he has limited salt intake and drinks plenty of water daily.  What exercise is being  done to improve your Blood Pressure Control?  Patient states he doesn't move around much in the heat and sits in his recliner mainly.  Adherence Review: Is the patient currently on ACE/ARB medication? Yes Does the patient have >5 day gap between last estimated fill dates? CPP to review  Care Gaps: Last annual wellness visit? 07-21-2020 Hep C screening overdue Shingrix overdue  Flu vaccine overdue  Star Rating Drugs:  Medication:  Last Fill: Day Supply Valsartan 320 mg  01-14-2021 90DS Atorvastatin 10 mg 01-17-2021 90DS   [place] Clinical Pharmacist Assistant [phone]

## 2021-04-12 ENCOUNTER — Other Ambulatory Visit: Payer: Self-pay

## 2021-04-12 MED ORDER — TAMSULOSIN HCL 0.4 MG PO CAPS: 0.4000 mg | ORAL_CAPSULE | Freq: Every day | ORAL | 0 refills | Status: DC

## 2021-04-17 ENCOUNTER — Other Ambulatory Visit: Payer: Self-pay | Admitting: Family Medicine

## 2021-04-19 ENCOUNTER — Other Ambulatory Visit: Payer: Self-pay | Admitting: Family Medicine

## 2021-04-22 NOTE — Progress Notes (Signed)
Subjective:  Patient ID: John Kemp, male    DOB: Aug 12, 1946  Age: 75 y.o. MRN: [id number]  Chief Complaint  Patient presents with   Hypothyroidism   Hypertension   Hyperlipidemia    HPI: Hyperlipidemia: Current medications: lipitor 10 mg once daily.  Lifestyle changes: not eating healthy. Not exercising.   Hypertension/Atrial fibrillation: Current medications:amlodipine. Coreg 3.125 mg BID, Lasix  (Twice weekly), amiodarone 200 mg once daily, eliquis 5 mg BID, klor-con 10 MEQ, valsartan 320 mg once daily  Lifestyle changes:  Memory change: had 2 strokes and was seen by Dr. Leonie Man in the past. His wife told Dr. Leonie Man that he was having issues with memory. Stroke caused a superior visual defect. Started on aricept.  Pt was seen for possible UTI 2 weeks ago, but urine culture was normal. Pt put on tamsulosin which has helped urge incontinence, but still having intermittent back pain and not feeling quite right. No dysuria. Pt is unsure if he is fully emptying his bladder. Nocturia has greatly improved down to once a night. Repeat UA normal.    Current Outpatient Medications on File Prior to Visit  Medication Sig Dispense Refill   albuterol (PROVENTIL) (2.5 MG/3ML) 0.083% nebulizer solution Take 2.5 mg by nebulization every 6 (six) hours as needed for wheezing or shortness of breath.     amiodarone (PACERONE) 200 MG tablet TAKE 1 TABLET BY MOUTH EVERY DAY 90 tablet 2   amLODipine (NORVASC) 5 MG tablet Take 1 tablet (5 mg total) by mouth daily. 90 tablet 2   atorvastatin (LIPITOR) 10 MG tablet Take 1 tablet (10 mg total) by mouth at bedtime. 90 tablet 1   carvedilol (COREG) 3.125 MG tablet TAKE 1 TABLET (3.125 MG TOTAL) BY MOUTH 2 (TWO) TIMES DAILY WITH A MEAL. 180 tablet 2   donepezil (ARICEPT) 10 MG tablet TAKE 1 TABLET BY MOUTH EVERYDAY AT BEDTIME 90 tablet 1   ELIQUIS 5 MG TABS tablet TAKE 1 TABLET BY MOUTH TWICE A DAY (Patient taking differently: Take 5 mg by mouth 2 (two)  times daily.) 180 tablet 1   furosemide (LASIX) 40 MG tablet Take 1 tablet twice weekly on Wednesday and Saturday. (Patient taking differently: Take 40 mg by mouth See admin instructions. Take 40 mg by mouth as needed for feet swelling) 24 tablet 3   Inulin (FIBER CHOICE PO) Take 3 tablets by mouth daily.     levothyroxine (SYNTHROID) 150 MCG tablet TAKE 1 TABLET BY MOUTH EVERY DAY (Patient taking differently: 150 mcg daily before breakfast.) 90 tablet 0   nitroGLYCERIN (NITROSTAT) 0.4 MG SL tablet Place 1 tablet (0.4 mg total) under the tongue every 5 (five) minutes as needed for chest pain. 25 tablet 5   potassium chloride (K-DUR,KLOR-CON) 10 MEQ tablet Take 10 mEq by mouth See admin instructions. Take 10 mEq by mouth whenever you take furosemide     valsartan (DIOVAN) 320 MG tablet TAKE ONE TABLET BY MOUTH DAILY (Patient taking differently: Take 320 mg by mouth daily.) 90 tablet 3   Vitamin D, Ergocalciferol, (DRISDOL) 1.25 MG (50000 UNIT) CAPS capsule TAKE 1 CAPSULE (50,000 UNITS TOTAL) BY MOUTH EVERY SATURDAY. MORNING. 12 capsule 0   zolpidem (AMBIEN) 10 MG tablet TAKE ONE TABLET BY MOUTH AT BEDTIME 30 tablet 0   zolpidem (AMBIEN) 10 MG tablet Take 1 tablet (10 mg total) by mouth at bedtime as needed for sleep. 90 tablet 1   No current facility-administered medications on file prior to visit.  Past Medical History:  Diagnosis Date   A-fib ([place])    Anticoagulated 01/21/2015   Atherosclerotic heart disease of native coronary artery without angina pectoris    Blepharitis of left lower eyelid 12/02/2019   CAD in native artery 01/21/2015   Cancer ([place])    skin cancer   Chest pain 04/02/2017   CHF (congestive heart failure) ([place]) 11/05/2019   Chronic diastolic (congestive) heart failure (HCC)    Chronic diastolic heart failure ([place]) 01/21/2015   Last Assessment & Plan:  Is congestive heart failure is mildly decompensated he is edematous he'll increase the dose of his diuretic today the lab work  performed including an ARB referred to care transitions for home care he'll follow-up in my office with me in 4-6 weeks daily weights sodium restrictionand compliance of medications beinghemoglobin of will   Chronic obstructive pulmonary disease (COPD) ([place])    Dyspnea 12/31/2014   Followed in Pulmonary clinic/ [facility]/ [place]  - amiodarone rx 11/2014 >>> - PFTs rec 12/31/2014     Heart attack ([place])    High cholesterol 10/20/2017   Hx of CABG 03/02/2017   Hyperlipidemia    Hypertension    Hypertensive heart disease 01/21/2015   Hypertensive heart disease with heart failure (HCC)    Hypothyroidism    Hypothyroidism (acquired) 04/24/2018   Impaired fasting glucose    Leg fatigue 03/03/2017   Obesity 04/02/2017   On amiodarone therapy 01/21/2015   PAF (paroxysmal atrial fibrillation) ([place])    Primary insomnia    Squamous cell carcinoma 11/05/2019   Stroke (cerebrum) ([place]) 09/2016   right superior quadrantanopsia   Stroke ([place])    Typical atrial flutter ([place]) 01/21/2015   Vitamin D deficiency, unspecified    Past Surgical History:  Procedure Laterality Date   APPENDECTOMY  1956   CARDIAC CATHETERIZATION     CARDIOVERSION  2002   CHOLECYSTECTOMY  2006   CORONARY ARTERY BYPASS GRAFT  2001   HERNIA REPAIR     RADIOLOGY WITH ANESTHESIA Bilateral 01/18/2018   Procedure: MRI WITH ANESTHESIA LUMBAR SPINE WITH AND WITHOUT CONTRAST;  Surgeon: Radiologist, Medication, MD;  Location: [facility];  Service: Radiology;  Laterality: Bilateral;   ROTATOR CUFF REPAIR Left    TONSILLECTOMY  1975    Family History  Problem Relation Age of Onset   Heart disease Father    Arrhythmia Father    Breast cancer Mother    Diabetes type II Sister    Diabetes Brother    Social History   Socioeconomic History   Marital status: Married    Spouse name: Not on file   Number of children: Not on file   Years of education: Not on file   Highest education level: Not on file  Occupational History   Occupation:  Retired  Tobacco Use   Smoking status: Former    Packs/day: 1.00    Years: 15.00    Pack years: 15.00    Types: Cigarettes    Quit date: 08/29/1978    Years since quitting: 42.6   Smokeless tobacco: Never  Vaping Use   Vaping Use: Never used  Substance and Sexual Activity   Alcohol use: No    Alcohol/week: 0.0 standard drinks   Drug use: No   Sexual activity: Not Currently  Other Topics Concern   Not on file  Social History Narrative   Not on file   Social Determinants of Health   Financial Resource Strain: Not on file  Food Insecurity: No Food  Insecurity   Worried About Charity fundraiser in the Last Year: Never true   Ran Out of Food in the Last Year: Never true  Transportation Needs: Not on file  Physical Activity: Not on file  Stress: Not on file  Social Connections: Not on file    Review of Systems  Constitutional:  Negative for chills, diaphoresis, fatigue and fever.  HENT:  Negative for congestion, ear pain and sore throat.   Respiratory:  Negative for cough and shortness of breath.   Cardiovascular:  Negative for chest pain and leg swelling.  Gastrointestinal:  Negative for abdominal pain, constipation, diarrhea, nausea and vomiting.  Endocrine: Negative for polydipsia, polyphagia and polyuria.  Genitourinary:  Positive for flank pain. Negative for dysuria and urgency.  Musculoskeletal:  Negative for arthralgias and myalgias.  Neurological:  Negative for dizziness and headaches.  Psychiatric/Behavioral:  Negative for dysphoric mood. The patient is not nervous/anxious.     Objective:  BP (!) 150/78   Pulse 78   Temp 98 F (36.7 C)   Resp 18   Ht '5\' 10"'$  (1.778 m)   Wt 242 lb (109.8 kg)   BMI 34.72 kg/m   BP/Weight 04/23/2021 04/05/2021 [id number]  Systolic BP [id number]  Diastolic BP 78 60 70  Wt. (Lbs) 242 238 243.2  BMI 34.72 34.15 34.9    Physical Exam Vitals reviewed.  Constitutional:      Appearance: Normal appearance. He is obese.   Cardiovascular:     Rate and Rhythm: Normal rate and regular rhythm.     Heart sounds: No murmur heard. Pulmonary:     Effort: Pulmonary effort is normal.     Breath sounds: Normal breath sounds.  Abdominal:     General: Abdomen is flat. Bowel sounds are normal.     Palpations: Abdomen is soft.     Tenderness: There is no abdominal tenderness.  Neurological:     Mental Status: He is alert and oriented to person, place, and time.  Psychiatric:        Mood and Affect: Mood normal.        Behavior: Behavior normal.    Diabetic Foot Exam - Simple   No data filed      Lab Results  Component Value Date   WBC 5.4 02/18/2021   HGB 15.0 02/18/2021   HCT 44.7 02/18/2021   PLT 171 02/18/2021   GLUCOSE 97 02/18/2021   CHOL 140 12/22/2020   TRIG 118 12/22/2020   HDL 35 (L) 12/22/2020   LDLCALC 83 12/22/2020   ALT 20 02/18/2021   AST 27 02/18/2021   NA 135 02/18/2021   K 3.9 02/18/2021   CL 99 02/18/2021   CREATININE 1.18 02/18/2021   BUN 8 02/18/2021   CO2 25 02/18/2021   TSH 2.050 12/22/2020   INR 1.3 (H) 02/18/2021   HGBA1C 5.3 05/22/2020   MICROALBUR Positive 150 01/23/2019   I,Victoria C Greene,acting as a scribe for [name], MD.,have documented all relevant documentation on the behalf of [name], MD,as directed by  [name], MD while in the presence of [name], MD.    Assessment & Plan:   1. Mixed hyperlipidemia Well controlled.  No changes to medicines.  Continue to work on eating a healthy diet and exercise.  Labs drawn today.  - Lipid Panel  2. Hypertensive heart disease with chronic diastolic congestive heart failure ([place]) Well controlled.  No changes to medicines.  Continue to work on eating a  healthy diet and exercise.  Labs drawn today.  - CBC with Differential - Comprehensive metabolic panel  4. Chronic atrial fibrillation (HCC) The current medical regimen is effective;  continue present plan and medications. Management per  specialist.   5. Flank pain - POCT urinalysis dipstick  6. Benign prostatic hyperplasia with incomplete bladder emptying - PSA  7. Prediabetes Recommend continue to work on eating healthy diet and exercise. - Hemoglobin A1c  9. Need for immunization against influenza - Flu Vaccine QUAD High Dose(Fluad)   Meds ordered this encounter  Medications   tamsulosin (FLOMAX) 0.4 MG CAPS capsule    Sig: Take 1 capsule (0.4 mg total) by mouth daily.    Dispense:  90 capsule    Refill:  1     Orders Placed This Encounter  Procedures   Flu Vaccine QUAD High Dose(Fluad)   CBC with Differential   Comprehensive metabolic panel   Lipid Panel   Hemoglobin A1c   PSA   POCT urinalysis dipstick      Follow-up: Return in about 4 months (around 08/23/2021) for fasting.  An After Visit Summary was printed and given to the patient.  [name], MD [facility] [phone]

## 2021-04-23 ENCOUNTER — Encounter: Payer: Self-pay | Admitting: Family Medicine

## 2021-04-23 ENCOUNTER — Other Ambulatory Visit: Payer: Self-pay

## 2021-04-23 ENCOUNTER — Ambulatory Visit (INDEPENDENT_AMBULATORY_CARE_PROVIDER_SITE_OTHER): Payer: Medicare Other | Admitting: Family Medicine

## 2021-04-23 VITALS — BP 150/78 | HR 78 | Temp 98.0°F | Resp 18 | Ht 70.0 in | Wt 242.0 lb

## 2021-04-23 DIAGNOSIS — I110 Hypertensive heart disease with heart failure: Secondary | ICD-10-CM

## 2021-04-23 DIAGNOSIS — R3914 Feeling of incomplete bladder emptying: Secondary | ICD-10-CM

## 2021-04-23 DIAGNOSIS — E039 Hypothyroidism, unspecified: Secondary | ICD-10-CM | POA: Diagnosis not present

## 2021-04-23 DIAGNOSIS — N401 Enlarged prostate with lower urinary tract symptoms: Secondary | ICD-10-CM

## 2021-04-23 DIAGNOSIS — I5032 Chronic diastolic (congestive) heart failure: Secondary | ICD-10-CM | POA: Diagnosis not present

## 2021-04-23 DIAGNOSIS — E782 Mixed hyperlipidemia: Secondary | ICD-10-CM | POA: Diagnosis not present

## 2021-04-23 DIAGNOSIS — I482 Chronic atrial fibrillation, unspecified: Secondary | ICD-10-CM | POA: Diagnosis not present

## 2021-04-23 DIAGNOSIS — R7303 Prediabetes: Secondary | ICD-10-CM

## 2021-04-23 DIAGNOSIS — R109 Unspecified abdominal pain: Secondary | ICD-10-CM | POA: Diagnosis not present

## 2021-04-23 DIAGNOSIS — Z23 Encounter for immunization: Secondary | ICD-10-CM | POA: Diagnosis not present

## 2021-04-23 LAB — POCT URINALYSIS DIPSTICK
Blood, UA: NEGATIVE
Glucose, UA: NEGATIVE
Ketones, UA: NEGATIVE
Leukocytes, UA: NEGATIVE
Nitrite, UA: NEGATIVE
Protein, UA: POSITIVE — AB
Spec Grav, UA: 1.025 (ref 1.010–1.025)
Urobilinogen, UA: 1 E.U./dL
pH, UA: 5.5 (ref 5.0–8.0)

## 2021-04-23 MED ORDER — TAMSULOSIN HCL 0.4 MG PO CAPS: 0.4000 mg | ORAL_CAPSULE | Freq: Every day | ORAL | 1 refills | Status: DC

## 2021-04-24 LAB — CBC WITH DIFFERENTIAL/PLATELET
Basophils Absolute: 0.1 10*3/uL (ref 0.0–0.2)
Basos: 1 %
EOS (ABSOLUTE): 0.3 10*3/uL (ref 0.0–0.4)
Eos: 5 %
Hematocrit: 42.5 % (ref 37.5–51.0)
Hemoglobin: 14.5 g/dL (ref 13.0–17.7)
Immature Grans (Abs): 0 10*3/uL (ref 0.0–0.1)
Immature Granulocytes: 0 %
Lymphocytes Absolute: 1.6 10*3/uL (ref 0.7–3.1)
Lymphs: 28 %
MCH: 30.7 pg (ref 26.6–33.0)
MCHC: 34.1 g/dL (ref 31.5–35.7)
MCV: 90 fL (ref 79–97)
Monocytes Absolute: 0.7 10*3/uL (ref 0.1–0.9)
Monocytes: 12 %
Neutrophils Absolute: 3.1 10*3/uL (ref 1.4–7.0)
Neutrophils: 54 %
Platelets: 190 10*3/uL (ref 150–450)
RBC: 4.73 x10E6/uL (ref 4.14–5.80)
RDW: 12.8 % (ref 11.6–15.4)
WBC: 5.8 10*3/uL (ref 3.4–10.8)

## 2021-04-24 LAB — HEMOGLOBIN A1C
Est. average glucose Bld gHb Est-mCnc: 108 mg/dL
Hgb A1c MFr Bld: 5.4 % (ref 4.8–5.6)

## 2021-04-24 LAB — PSA: Prostate Specific Ag, Serum: 0.9 ng/mL (ref 0.0–4.0)

## 2021-04-24 LAB — COMPREHENSIVE METABOLIC PANEL
ALT: 21 IU/L (ref 0–44)
AST: 22 IU/L (ref 0–40)
Albumin/Globulin Ratio: 2 (ref 1.2–2.2)
Albumin: 4.5 g/dL (ref 3.7–4.7)
Alkaline Phosphatase: 95 IU/L (ref 44–121)
BUN/Creatinine Ratio: 12 (ref 10–24)
BUN: 15 mg/dL (ref 8–27)
Bilirubin Total: 1 mg/dL (ref 0.0–1.2)
CO2: 24 mmol/L (ref 20–29)
Calcium: 9.3 mg/dL (ref 8.6–10.2)
Chloride: 102 mmol/L (ref 96–106)
Creatinine, Ser: 1.26 mg/dL (ref 0.76–1.27)
Globulin, Total: 2.3 g/dL (ref 1.5–4.5)
Glucose: 94 mg/dL (ref 65–99)
Potassium: 3.8 mmol/L (ref 3.5–5.2)
Sodium: 141 mmol/L (ref 134–144)
Total Protein: 6.8 g/dL (ref 6.0–8.5)
eGFR: 61 mL/min/{1.73_m2} (ref >59)

## 2021-04-24 LAB — LIPID PANEL
Chol/HDL Ratio: 3.4 ratio (ref 0.0–5.0)
Cholesterol, Total: 134 mg/dL (ref 100–199)
HDL: 39 mg/dL — ABNORMAL LOW (ref >39)
LDL Chol Calc (NIH): 76 mg/dL (ref 0–99)
Triglycerides: 99 mg/dL (ref 0–149)
VLDL Cholesterol Cal: 19 mg/dL (ref 5–40)

## 2021-04-24 LAB — CARDIOVASCULAR RISK ASSESSMENT

## 2021-05-07 ENCOUNTER — Telehealth: Payer: Self-pay

## 2021-05-07 NOTE — Chronic Care Management (AMB) (Signed)
    Chronic Care Management Pharmacy Assistant   Name: Agrim Wieting  MRN: [id number] DOB: [birth year]  Reason for Encounter: Patient Assistance Documentation    Medications: Outpatient Encounter Medications as of 05/07/2021  Medication Sig   albuterol (PROVENTIL) (2.5 MG/3ML) 0.083% nebulizer solution Take 2.5 mg by nebulization every 6 (six) hours as needed for wheezing or shortness of breath.   amiodarone (PACERONE) 200 MG tablet TAKE 1 TABLET BY MOUTH EVERY DAY   amLODipine (NORVASC) 5 MG tablet Take 1 tablet (5 mg total) by mouth daily.   atorvastatin (LIPITOR) 10 MG tablet Take 1 tablet (10 mg total) by mouth at bedtime.   carvedilol (COREG) 3.125 MG tablet TAKE 1 TABLET (3.125 MG TOTAL) BY MOUTH 2 (TWO) TIMES DAILY WITH A MEAL.   donepezil (ARICEPT) 10 MG tablet TAKE 1 TABLET BY MOUTH EVERYDAY AT BEDTIME   ELIQUIS 5 MG TABS tablet TAKE 1 TABLET BY MOUTH TWICE A DAY (Patient taking differently: Take 5 mg by mouth 2 (two) times daily.)   furosemide (LASIX) 40 MG tablet Take 1 tablet twice weekly on Wednesday and Saturday. (Patient taking differently: Take 40 mg by mouth See admin instructions. Take 40 mg by mouth as needed for feet swelling)   Inulin (FIBER CHOICE PO) Take 3 tablets by mouth daily.   levothyroxine (SYNTHROID) 150 MCG tablet TAKE 1 TABLET BY MOUTH EVERY DAY (Patient taking differently: 150 mcg daily before breakfast.)   nitroGLYCERIN (NITROSTAT) 0.4 MG SL tablet Place 1 tablet (0.4 mg total) under the tongue every 5 (five) minutes as needed for chest pain.   potassium chloride (K-DUR,KLOR-CON) 10 MEQ tablet Take 10 mEq by mouth See admin instructions. Take 10 mEq by mouth whenever you take furosemide   tamsulosin (FLOMAX) 0.4 MG CAPS capsule Take 1 capsule (0.4 mg total) by mouth daily.   valsartan (DIOVAN) 320 MG tablet TAKE ONE TABLET BY MOUTH DAILY (Patient taking differently: Take 320 mg by mouth daily.)   Vitamin D, Ergocalciferol, (DRISDOL) 1.25 MG (50000 UNIT)  CAPS capsule TAKE 1 CAPSULE (50,000 UNITS TOTAL) BY MOUTH EVERY SATURDAY. MORNING.   zolpidem (AMBIEN) 10 MG tablet TAKE ONE TABLET BY MOUTH AT BEDTIME   zolpidem (AMBIEN) 10 MG tablet Take 1 tablet (10 mg total) by mouth at bedtime as needed for sleep.   No facility-administered encounter medications on file as of 05/07/2021.   Patient was referred by Marge Duncans, PA-C for patient assistance Prepared and reviewed patient assistance application for Eliquis through [facility]. Patient was informed and made aware that this application is to be completed and returned to PCP's office with required documentation at his earliest convenience.   Wilford Sports CPA, CMA

## 2021-05-10 NOTE — Progress Notes (Signed)
Cardiology Office Note:    Date:  05/11/2021   ID:  John Kemp, DOB 01/31/1946, MRN [id number]  PCP:  [name], MD  Cardiologist:  Shirlee More, MD    Referring MD: [name], MD   he is on amiodarone please do TSH free T4 free T3 with his next phlebotomy and every 6 months.   ASSESSMENT:    1. PAF (paroxysmal atrial fibrillation) ([place])   2. Typical atrial flutter ([place])   3. On amiodarone therapy   4. Anticoagulated   5. Hypertensive heart disease with chronic diastolic congestive heart failure ([place])   6. Pure hypercholesterolemia   7. CAD in native artery    PLAN:    In order of problems listed above:  [place] continues to do well maintaining sinus rhythm long-term on low-dose amiodarone and tolerates his anticoagulant without any bleeding.  No evidence of amiodarone lung toxicity.  He will need thyroid studies with his next lab work. Stable heart failure compensated continue his current loop diuretic beta-blocker and ARB. Lipids are ideal continue his high intensity statin Stable CAD having no anginal discomfort continue medical treatment including beta-blocker calcium channel blocker statin.  At this time I would not advise an ischemia evaluation   Next appointment: 6 months   Medication Adjustments/Labs and Tests Ordered: Current medicines are reviewed at length with the patient today.  Concerns regarding medicines are outlined above.  No orders of the defined types were placed in this encounter.  No orders of the defined types were placed in this encounter.   Chief Complaint  Patient presents with   Follow-up   Atrial Fibrillation   Atrial Flutter   Congestive Heart Failure     History of Present Illness:    John Kemp is a 75 y.o. male with a hx of paroxysmal atrial fibrillation and flutter maintaining sinus rhythm on amiodarone, chronic anticoagulation, CAD hypertension with heart failure bilateral less than 50% ICA stenosis on carotid  duplex and hyper lipidemia last seen 11/12/2020.  Compliance with diet, lifestyle and medications: Yes  Junior COVID-19 infection vaccinated was profoundly weak was seen in the ED: He quickly recovered and except for that he is done exceptionally well. He maintaining sinus rhythm on low-dose amiodarone he has had no palpitations or shortness of breath chest pain or syncope. No bleeding complication with his anticoagulant tolerates his statin without muscle pain or weakness. Recent lipid profile is ideal with an LDL of 76 A1c was 5.4% creatinine 1.26 potassium 3.8 04/23/2021.  He had a chest x-ray in the ED 02/18/2021 which was normal. Past Medical History:  Diagnosis Date   A-fib ([place])    Anticoagulated 01/21/2015   Atherosclerotic heart disease of native coronary artery without angina pectoris    Blepharitis of left lower eyelid 12/02/2019   CAD in native artery 01/21/2015   Cancer ([place])    skin cancer   Chest pain 04/02/2017   CHF (congestive heart failure) ([place]) 11/05/2019   Chronic diastolic (congestive) heart failure (H[facility])    Chronic diastolic heart failure ([place]) 01/21/2015   Last Assessment & Plan:  Is congestive heart failure is mildly decompensated he is edematous he'll increase the dose of his diuretic today the lab work performed including an ARB referred to [facility] for home care he'll follow-up in my office with me in 4-6 weeks daily weights sodium restrictionand compliance of medications beinghemoglobin of will   Chronic obstructive pulmonary disease (COPD) ([place])    Dyspnea 12/31/2014  Followed in Pulmonary clinic/ [facility]/ [place]  - amiodarone rx 11/2014 >>> - PFTs rec 12/31/2014     Heart attack ([place])    High cholesterol 10/20/2017   Hx of CABG 03/02/2017   Hyperlipidemia    Hypertension    Hypertensive heart disease 01/21/2015   Hypertensive heart disease with heart failure (H[facility])    Hypothyroidism    Hypothyroidism (acquired) 04/24/2018   Impaired fasting glucose     Leg fatigue 03/03/2017   Obesity 04/02/2017   On amiodarone therapy 01/21/2015   PAF (paroxysmal atrial fibrillation) ([place])    Primary insomnia    Squamous cell carcinoma 11/05/2019   Stroke (cerebrum) ([place]) 09/2016   right superior quadrantanopsia   Stroke ([place])    Typical atrial flutter ([place]) 01/21/2015   Vitamin D deficiency, unspecified     Past Surgical History:  Procedure Laterality Date   APPENDECTOMY  1956   CARDIAC CATHETERIZATION     CARDIOVERSION  2002   CHOLECYSTECTOMY  2006   CORONARY ARTERY BYPASS GRAFT  2001   HERNIA REPAIR     RADIOLOGY WITH ANESTHESIA Bilateral 01/18/2018   Procedure: MRI WITH ANESTHESIA LUMBAR SPINE WITH AND WITHOUT CONTRAST;  Surgeon: Radiologist, Medication, MD;  Location: [facility];  Service: Radiology;  Laterality: Bilateral;   ROTATOR CUFF REPAIR Left    TONSILLECTOMY  1975    Current Medications: Current Meds  Medication Sig   albuterol (PROVENTIL) (2.5 MG/3ML) 0.083% nebulizer solution Take 2.5 mg by nebulization every 6 (six) hours as needed for wheezing or shortness of breath.   amiodarone (PACERONE) 200 MG tablet TAKE 1 TABLET BY MOUTH EVERY DAY   amLODipine (NORVASC) 5 MG tablet Take 1 tablet (5 mg total) by mouth daily.   atorvastatin (LIPITOR) 10 MG tablet Take 1 tablet (10 mg total) by mouth at bedtime.   carvedilol (COREG) 3.125 MG tablet TAKE 1 TABLET (3.125 MG TOTAL) BY MOUTH 2 (TWO) TIMES DAILY WITH A MEAL.   donepezil (ARICEPT) 10 MG tablet TAKE 1 TABLET BY MOUTH EVERYDAY AT BEDTIME   ELIQUIS 5 MG TABS tablet TAKE 1 TABLET BY MOUTH TWICE A DAY (Patient taking differently: Take 5 mg by mouth 2 (two) times daily.)   furosemide (LASIX) 40 MG tablet Take 1 tablet twice weekly on Wednesday and Saturday. (Patient taking differently: Take 40 mg by mouth See admin instructions. Take 40 mg by mouth as needed for feet swelling)   Inulin (FIBER CHOICE PO) Take 3 tablets by mouth daily.   levothyroxine (SYNTHROID) 150 MCG tablet TAKE 1 TABLET BY  MOUTH EVERY DAY (Patient taking differently: 150 mcg daily before breakfast.)   nitroGLYCERIN (NITROSTAT) 0.4 MG SL tablet Place 1 tablet (0.4 mg total) under the tongue every 5 (five) minutes as needed for chest pain.   potassium chloride (K-DUR,KLOR-CON) 10 MEQ tablet Take 10 mEq by mouth See admin instructions. Take 10 mEq by mouth whenever you take furosemide   tamsulosin (FLOMAX) 0.4 MG CAPS capsule Take 1 capsule (0.4 mg total) by mouth daily.   valsartan (DIOVAN) 320 MG tablet TAKE ONE TABLET BY MOUTH DAILY (Patient taking differently: Take 320 mg by mouth daily.)   Vitamin D, Ergocalciferol, (DRISDOL) 1.25 MG (50000 UNIT) CAPS capsule TAKE 1 CAPSULE (50,000 UNITS TOTAL) BY MOUTH EVERY SATURDAY. MORNING.   zolpidem (AMBIEN) 10 MG tablet TAKE ONE TABLET BY MOUTH AT BEDTIME   zolpidem (AMBIEN) 10 MG tablet Take 1 tablet (10 mg total) by mouth at bedtime as needed for sleep.  Allergies:   Avelox [moxifloxacin hcl], Cartia xt [diltiazem hcl], Pravachol [pravastatin sodium], Prednisone, and Zetia [ezetimibe]   Social History   Socioeconomic History   Marital status: Married    Spouse name: Not on file   Number of children: Not on file   Years of education: Not on file   Highest education level: Not on file  Occupational History   Occupation: Retired  Tobacco Use   Smoking status: Former    Packs/day: 1.00    Years: 15.00    Pack years: 15.00    Types: Cigarettes    Quit date: 08/29/1978    Years since quitting: 42.7   Smokeless tobacco: Never  Vaping Use   Vaping Use: Never used  Substance and Sexual Activity   Alcohol use: No    Alcohol/week: 0.0 standard drinks   Drug use: No   Sexual activity: Not Currently  Other Topics Concern   Not on file  Social History Narrative   Not on file   Social Determinants of Health   Financial Resource Strain: Not on file  Food Insecurity: No Food Insecurity   Worried About Charity fundraiser in the Last Year: Never true   Ran Out  of Food in the Last Year: Never true  Transportation Needs: Not on file  Physical Activity: Not on file  Stress: Not on file  Social Connections: Not on file     Family History: The patient's family history includes Arrhythmia in his father; Breast cancer in his mother; Diabetes in his brother; Diabetes type II in his sister; Heart disease in his father. ROS:   Please see the history of present illness.    All other systems reviewed and are negative.  EKGs/Labs/Other Studies Reviewed:    The following studies were reviewed today:  EKG:  EKG ordered today and personally reviewed.  The ekg ordered today demonstrates sinus rhythm old inferior MI  Recent Labs: 05/14/2020: NT-Pro BNP 330 12/22/2020: TSH 2.050 02/18/2021: B Natriuretic Peptide 199.8 04/23/2021: ALT 21; BUN 15; Creatinine, Ser 1.26; Hemoglobin 14.5; Platelets 190; Potassium 3.8; Sodium 141  Recent Lipid Panel    Component Value Date/Time   CHOL 134 04/23/2021 0953   TRIG 99 04/23/2021 0953   HDL 39 (L) 04/23/2021 0953   CHOLHDL 3.4 04/23/2021 0953   CHOLHDL 5.9 04/02/2017 0213   VLDL 35 04/02/2017 0213   LDLCALC 76 04/23/2021 0953    Physical Exam:    VS:  BP (!) 158/80 (BP Location: Right Arm, Patient Position: Sitting)   Pulse (!) 48   Ht '5\' 10"'$  (1.778 m)   Wt 238 lb 3.2 oz (108 kg)   SpO2 97%   BMI 34.18 kg/m     Wt Readings from Last 3 Encounters:  05/11/21 238 lb 3.2 oz (108 kg)  04/23/21 242 lb (109.8 kg)  04/05/21 238 lb (108 kg)     GEN:  Well nourished, well developed in no acute distress HEENT: Normal NECK: No JVD; No carotid bruits LYMPHATICS: No lymphadenopathy CARDIAC: RRR, no murmurs, rubs, gallops RESPIRATORY:  Clear to auscultation without rales, wheezing or rhonchi  ABDOMEN: Soft, non-tender, non-distended MUSCULOSKELETAL:  No edema; No deformity he has trace edema and stasis changes   SKIN: Warm and dry NEUROLOGIC:  Alert and oriented x 3 PSYCHIATRIC:  Normal affect     Signed, Shirlee More, MD  05/11/2021 10:07 AM    [place]

## 2021-05-11 ENCOUNTER — Ambulatory Visit (INDEPENDENT_AMBULATORY_CARE_PROVIDER_SITE_OTHER): Payer: Medicare Other | Admitting: Cardiology

## 2021-05-11 ENCOUNTER — Other Ambulatory Visit: Payer: Self-pay

## 2021-05-11 ENCOUNTER — Encounter: Payer: Self-pay | Admitting: Cardiology

## 2021-05-11 VITALS — BP 158/80 | HR 48 | Ht 70.0 in | Wt 238.2 lb

## 2021-05-11 DIAGNOSIS — E7800 Pure hypercholesterolemia, unspecified: Secondary | ICD-10-CM

## 2021-05-11 DIAGNOSIS — I480 Paroxysmal atrial fibrillation: Secondary | ICD-10-CM | POA: Diagnosis not present

## 2021-05-11 DIAGNOSIS — I110 Hypertensive heart disease with heart failure: Secondary | ICD-10-CM

## 2021-05-11 DIAGNOSIS — I5032 Chronic diastolic (congestive) heart failure: Secondary | ICD-10-CM

## 2021-05-11 DIAGNOSIS — I2510 Atherosclerotic heart disease of native coronary artery without angina pectoris: Secondary | ICD-10-CM

## 2021-05-11 DIAGNOSIS — Z7901 Long term (current) use of anticoagulants: Secondary | ICD-10-CM | POA: Diagnosis not present

## 2021-05-11 DIAGNOSIS — Z79899 Other long term (current) drug therapy: Secondary | ICD-10-CM

## 2021-05-11 DIAGNOSIS — I483 Typical atrial flutter: Secondary | ICD-10-CM

## 2021-05-11 NOTE — Patient Instructions (Signed)

## 2021-05-14 ENCOUNTER — Ambulatory Visit: Payer: Medicare Other | Admitting: Cardiology

## 2021-06-07 ENCOUNTER — Other Ambulatory Visit: Payer: Self-pay

## 2021-06-07 ENCOUNTER — Ambulatory Visit (INDEPENDENT_AMBULATORY_CARE_PROVIDER_SITE_OTHER): Payer: Medicare Other

## 2021-06-07 DIAGNOSIS — E782 Mixed hyperlipidemia: Secondary | ICD-10-CM

## 2021-06-07 DIAGNOSIS — I482 Chronic atrial fibrillation, unspecified: Secondary | ICD-10-CM

## 2021-06-07 DIAGNOSIS — E039 Hypothyroidism, unspecified: Secondary | ICD-10-CM

## 2021-06-07 DIAGNOSIS — I110 Hypertensive heart disease with heart failure: Secondary | ICD-10-CM

## 2021-06-07 NOTE — Progress Notes (Signed)
Chronic Care Management Pharmacy Note  06/07/2021 Name:  John Kemp MRN:  [id number] DOB:  Mar 29, 1946   Plan Recommendations:  Working with patient to make Eliquis more affordable. Was denied PAP in April but hopefully will be approved now. If nit, will ask specialist to try Xarelto if that PAP is easier to complete (per patient, this is Cardio's plan)   Subjective: John Kemp is an 75 y.o. year old male who is a primary patient of Cox, Kirsten, MD.  The CCM team was consulted for assistance with disease management and care coordination needs.    Engaged with patient face to face for follow up visit in response to provider referral for pharmacy case management and/or care coordination services.   Consent to Services:  The patient was given the following information about Chronic Care Management services today, agreed to services, and gave verbal consent: 1. CCM service includes personalized support from designated clinical staff supervised by the primary care provider, including individualized plan of care and coordination with other care providers 2. 24/7 contact phone numbers for assistance for urgent and routine care needs. 3. Service will only be billed when office clinical staff spend 20 minutes or more in a month to coordinate care. 4. Only one practitioner may furnish and bill the service in a calendar month. 5.The patient may stop CCM services at any time (effective at the end of the month) by phone call to the office staff. 6. The patient will be responsible for cost sharing (co-pay) of up to 20% of the service fee (after annual deductible is met). Patient agreed to services and consent obtained.  Patient Care Team: [name], MD as PCP - General (Family Medicine) Burnice Logan, [facility] (Inactive) as Pharmacist (Pharmacist) Lane Hacker, [facility] as Pharmacist (Pharmacist)  Recent office visits: 09/21/2020 - labs drawn. Refer to CCM. Order carotid ultrasound   07/21/2020 - medicare AWV 06/30/2020 - COVID vaccine.   Recent consult visits: 11/12/2020 - cardio - tsh is normal. No thyroid toxicity. Stable Afib. Heart failure is compensated. Stable CAD.   Hospital visits: None in previous 6 months  Objective:  Lab Results  Component Value Date   CREATININE 1.26 04/23/2021   BUN 15 04/23/2021   GFRNONAA >60 02/18/2021   GFRAA 54 (L) 09/21/2020   NA 141 04/23/2021   K 3.8 04/23/2021   CALCIUM 9.3 04/23/2021   CO2 24 04/23/2021   GLUCOSE 94 04/23/2021    Lab Results  Component Value Date/Time   HGBA1C 5.4 04/23/2021 09:49 AM   HGBA1C 5.3 05/22/2020 09:07 AM   MICROALBUR Positive 150 01/23/2019 12:00 AM    Last diabetic Eye exam: No results found for: HMDIABEYEEXA  Last diabetic Foot exam: No results found for: HMDIABFOOTEX   Lab Results  Component Value Date   CHOL 134 04/23/2021   HDL 39 (L) 04/23/2021   LDLCALC 76 04/23/2021   TRIG 99 04/23/2021   CHOLHDL 3.4 04/23/2021    Hepatic Function Latest Ref Rng & Units 04/23/2021 02/18/2021 12/22/2020  Total Protein 6.0 - 8.5 g/dL 6.8 7.3 6.9  Albumin 3.7 - 4.7 g/dL 4.5 4.1 4.4  AST 0 - 40 IU/L '22 27 23  ' ALT 0 - 44 IU/L '21 20 24  ' Alk Phosphatase 44 - 121 IU/L 95 80 103  Total Bilirubin 0.0 - 1.2 mg/dL 1.0 1.8(H) 1.0    Lab Results  Component Value Date/Time   TSH 2.050 12/22/2020 10:02 AM   TSH 5.690 (H) 09/21/2020 10:05  AM   FREET4 1.88 (H) 05/14/2020 11:08 AM    CBC Latest Ref Rng & Units 04/23/2021 02/18/2021 12/22/2020  WBC 3.4 - 10.8 x10E3/uL 5.8 5.4 6.1  Hemoglobin 13.0 - 17.7 g/dL 14.5 15.0 14.4  Hematocrit 37.5 - 51.0 % 42.5 44.7 42.3  Platelets 150 - 450 x10E3/uL 190 171 202    Lab Results  Component Value Date/Time   VD25OH 13.6 (L) 10/23/2017 12:35 PM    Clinical ASCVD: Yes  The ASCVD Risk score (Arnett DK, et al., 2019) failed to calculate for the following reasons:   The patient has a prior MI or stroke diagnosis    Depression screen [facility] 2/9 04/23/2021  04/05/2021 12/22/2020  Decreased Interest 0 0 0  Down, Depressed, Hopeless 0 0 0  PHQ - 2 Score 0 0 0      Social History   Tobacco Use  Smoking Status Former   Packs/day: 1.00   Years: 15.00   Pack years: 15.00   Types: Cigarettes   Quit date: 08/29/1978   Years since quitting: 42.8  Smokeless Tobacco Never   BP Readings from Last 3 Encounters:  05/11/21 (!) 158/80  04/23/21 (!) 150/78  04/05/21 124/60   Pulse Readings from Last 3 Encounters:  05/11/21 (!) 48  04/23/21 78  04/05/21 (!) 51   Wt Readings from Last 3 Encounters:  05/11/21 238 lb 3.2 oz (108 kg)  04/23/21 242 lb (109.8 kg)  04/05/21 238 lb (108 kg)   BMI Readings from Last 3 Encounters:  05/11/21 34.18 kg/m  04/23/21 34.72 kg/m  04/05/21 34.15 kg/m    Assessment/Interventions: Review of patient past medical history, allergies, medications, health status, including review of consultants reports, laboratory and other test data, was performed as part of comprehensive evaluation and provision of chronic care management services.   SDOH:  (Social Determinants of Health) assessments and interventions performed: Yes SDOH Interventions    Flowsheet Row Most Recent Value  SDOH Interventions   Financial Strain Interventions Other (Comment)  [Will try PAP]      SDOH Screenings   Alcohol Screen: Low Risk    Last Alcohol Screening Score (AUDIT): 0  Depression (PHQ2-9): Low Risk    PHQ-2 Score: 0  Financial Resource Strain: High Risk   Difficulty of Paying Living Expenses: Very hard  Food Insecurity: No Food Insecurity   Worried About Charity fundraiser in the Last Year: Never true   Ran Out of Food in the Last Year: Never true  Housing: [place] Risk Score: 0  Physical Activity: Not on file  Social Connections: Not on file  Stress: Not on file  Tobacco Use: Medium Risk   Smoking Tobacco Use: Former   Smokeless Tobacco Use: Never  Transportation Needs: Not on file    [place]  Allergies  Allergen Reactions   Avelox [Moxifloxacin Hcl]    Cartia Xt [Diltiazem Hcl] Nausea And Vomiting   Pravachol [Pravastatin Sodium] Other (See Comments)    myalgia   Prednisone Other (See Comments)    "makes me feel terrible"   Zetia [Ezetimibe] Other (See Comments)    myalgias    Medications Reviewed Today     Reviewed by Toni Arthurs, CMA (Certified Medical Assistant) on 05/11/21 at [place] List Status: <None>   Medication Order Taking? Sig Documenting Provider Last Dose Status Informant  albuterol (PROVENTIL) (2.5 MG/3ML) 0.083% nebulizer solution [id number] Yes Take 2.5 mg by nebulization every 6 (  six) hours as needed for wheezing or shortness of breath. [provider] Taking Active Spouse/Significant Other  amiodarone (PACERONE) 200 MG tablet [id number] Yes TAKE 1 TABLET BY MOUTH EVERY DAY Bettina Gavia Hilton [place], MD Taking Active   amLODipine (NORVASC) 5 MG tablet [id number] Yes Take 1 tablet (5 mg total) by mouth daily. [name], MD Taking Active Spouse/Significant Other  atorvastatin (LIPITOR) 10 MG tablet [id number] Yes Take 1 tablet (10 mg total) by mouth at bedtime. Cox, Kirsten, MD Taking Active   carvedilol (COREG) 3.125 MG tablet [id number] Yes TAKE 1 TABLET (3.125 MG TOTAL) BY MOUTH 2 (TWO) TIMES DAILY WITH A MEAL. [name], MD Taking Active   donepezil (ARICEPT) 10 MG tablet [id number] Yes TAKE 1 TABLET BY MOUTH EVERYDAY AT BEDTIME Rip Harbour, NP Taking Active   ELIQUIS 5 MG TABS tablet [id number] Yes TAKE 1 TABLET BY MOUTH TWICE A DAY  Patient taking differently: Take 5 mg by mouth 2 (two) times daily.   Cox, Kirsten, MD Taking Active Spouse/Significant Other  furosemide (LASIX) 40 MG tablet [id number] Yes Take 1 tablet twice weekly on Wednesday and Saturday.  Patient taking differently: Take 40 mg by mouth See admin instructions. Take 40 mg by mouth as needed for feet swelling   [name], MD Taking Active   Inulin (FIBER CHOICE  PO) [id number] Yes Take 3 tablets by mouth daily. [provider] Taking Active Spouse/Significant Other  levothyroxine (SYNTHROID) 150 MCG tablet [id number] Yes TAKE 1 TABLET BY MOUTH EVERY DAY  Patient taking differently: 150 mcg daily before breakfast.   Marge Duncans, PA-C Taking Active Spouse/Significant Other  nitroGLYCERIN (NITROSTAT) 0.4 MG SL tablet [id number] Yes Place 1 tablet (0.4 mg total) under the tongue every 5 (five) minutes as needed for chest pain. [name], MD Taking Active Spouse/Significant Other  potassium chloride (K-DUR,KLOR-CON) 10 MEQ tablet [id number] Yes Take 10 mEq by mouth See admin instructions. Take 10 mEq by mouth whenever you take furosemide [provider] Taking Active Spouse/Significant Other  tamsulosin (FLOMAX) 0.4 MG CAPS capsule [id number] Yes Take 1 capsule (0.4 mg total) by mouth daily. Cox, Kirsten, MD Taking Active   valsartan (DIOVAN) 320 MG tablet [id number] Yes TAKE ONE TABLET BY MOUTH DAILY  Patient taking differently: Take 320 mg by mouth daily.   Cox, Kirsten, MD Taking Active Spouse/Significant Other  Vitamin D, Ergocalciferol, (DRISDOL) 1.25 MG (50000 UNIT) CAPS capsule [id number] Yes TAKE 1 CAPSULE (50,000 UNITS TOTAL) BY MOUTH EVERY SATURDAY. MORNING. Marge Duncans, PA-C Taking Active Spouse/Significant Other  zolpidem (AMBIEN) 10 MG tablet [id number] Yes TAKE ONE TABLET BY MOUTH AT BEDTIME Marge Duncans, PA-C Taking Active   zolpidem (AMBIEN) 10 MG tablet [id number] Yes Take 1 tablet (10 mg total) by mouth at bedtime as needed for sleep. Cox, Kirsten, MD Taking Active             Patient Active Problem List   Diagnosis Date Noted   Vitamin D deficiency, unspecified    Primary insomnia    Impaired fasting glucose    Hypothyroidism    Hypertensive heart disease with heart failure (HCC)    Hypertension    Hyperlipidemia    Heart attack ([place])    Chronic obstructive pulmonary disease (COPD) ([place])    Chronic diastolic  (congestive) heart failure (HCC)    Cancer (HCC)    A-fib (HCC)    Atherosclerotic heart disease of native coronary artery without angina pectoris    Screening for prostate cancer  02/06/2020   Blepharitis of left lower eyelid 12/02/2019   Hereditary thrombophilia ([place]) 11/17/2019   [place] of cardioembolic stroke 43/15/4008   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 11/17/2019   Chronic atrial fibrillation ([place]) 11/17/2019   Mixed hyperlipidemia 11/17/2019   Essential hypertension, benign 11/17/2019   CHF (congestive heart failure) ([place]) 11/05/2019   Squamous cell carcinoma 11/05/2019   Hypothyroidism (acquired) 04/24/2018   High cholesterol 10/20/2017   Class 1 obesity due to excess calories with serious comorbidity and body mass index (BMI) of 34.0 to 34.9 in adult 04/02/2017   Chest pain 04/02/2017   Obesity 04/02/2017   PAF (paroxysmal atrial fibrillation) ([place])    Stroke ([place]) 03/03/2017   Leg fatigue 03/03/2017   Hx of CABG 03/02/2017   Stroke (cerebrum) ([place]) 09/2016   Anticoagulated 01/21/2015   CAD in native artery 01/21/2015   Chronic diastolic heart failure ([place]) 01/21/2015   Hypertensive heart disease 01/21/2015   On amiodarone therapy 01/21/2015   Typical atrial flutter ([place]) 01/21/2015   Dyspnea 12/31/2014    Immunization History  Administered Date(s) Administered   Fluad Quad(high Dose 65+) 05/22/2020, 04/23/2021   Influenza Split 05/29/2014   Influenza-Unspecified 06/29/2018, 06/04/2019   Moderna SARS-COV2 Booster Vaccination 06/30/2020, 12/18/2020   Moderna Sars-Covid-2 Vaccination 10/25/2019, 11/27/2019   Pneumococcal Conjugate-13 07/12/2017   Pneumococcal Polysaccharide-23 07/19/2018   Tdap 04/30/2014    Conditions to be addressed/monitored:  Hypertension, Hyperlipidemia, Diabetes, Atrial Fibrillation, Heart Failure, Coronary Artery Disease and COPD  Care Plan : [place]  Updates made by Lane Hacker, RPH since  06/07/2021 12:00 AM     Problem: htn, hld, chf, afib   Priority: High  Onset Date: 12/07/2020     Long-Range Goal: Disease Management   Start Date: 12/07/2020  Expected End Date: 12/07/2021  Recent Progress: On track  Priority: High  Note:     Current Barriers:  Unable to independently afford treatment regimen  Pharmacist Clinical Goal(s):  Patient will verbalize ability to afford treatment regimen through collaboration with PharmD and provider.   Interventions: 1:1 collaboration with Cox, Elnita Maxwell, MD regarding development and update of comprehensive plan of care as evidenced by provider attestation and co-signature Inter-disciplinary care team collaboration (see longitudinal plan of care) Comprehensive medication review performed; medication list updated in electronic medical record  Hypertension (BP goal <130/80) -Controlled -Current treatment: Amlodipine 5 mg bid Carvedilol 3.125 mg bid with a meal Furosemide 40 mg twice weekly Valsartan 320 mg daily Potassium 10 meq daily prn  -Medications previously tried: diltiazem  -Current home readings: not checking -Current dietary habits: sandwich, cookies, home cooking -Current exercise habits: no regular exercise -Denies hypotensive/hypertensive symptoms -Educated on BP goals and benefits of medications for prevention of heart attack, stroke and kidney damage; Daily salt intake goal < 2300 mg; Exercise goal of 150 minutes per week; Importance of home blood pressure monitoring; -Counseled to monitor BP at home weekly, document, and provide log at future appointments -Counseled on diet and exercise extensively Recommended to continue current medication Recommended beginning to check bp weekly at home   Hyperlipidemia: (LDL goal < 55) -Not ideally controlled -Current treatment: atorvastatin 10 mg daily at bedtime -Medications previously tried: none reported  -Current dietary patterns: no specific diet.  -Current exercise  habits: no regular exercise -Educated on Cholesterol goals;  Benefits of statin for ASCVD risk reduction; Importance of limiting foods high in cholesterol; Exercise goal of 150 minutes per week; -Counseled on diet and exercise extensively Recommended to  continue current medication  Atrial Fibrillation (Goal: prevent stroke and major bleeding) -Controlled -CHADSVASC: 5 -Current treatment: Rate control: amiodarone 200 mg daily, carvedilol 3.25 mg bid  Anticoagulation:  apixaban 5 mg bid -Medications previously tried: diltiazem -Home BP and HR readings: not checking currently  -Counseled on increased risk of stroke due to Afib and benefits of anticoagulation for stroke prevention; importance of adherence to anticoagulant exactly as prescribed; bleeding risk associated with Eliquis and importance of self-monitoring for signs/symptoms of bleeding; avoidance of NSAIDs due to increased bleeding risk with anticoagulants; Coordinating patient assistance application for Eliquis and samples of Eliquis. -Recommended to continue current medication April 2022: Assessed patient finances. Patient has not spent 3% of income on medication at this time. Will continue to check for Eliquis PAP application October 1610: Will have team check on status of PAP, if not approved, will consider talking with PCP/Specialist about Xarelto PAP if it's possible to have that one approved easier than Eliquis. Patient told me he asked Cardio about this as well    Heart Failure (Goal: manage symptoms and prevent exacerbations) -Controlled -Last ejection fraction: 08/18 (Date: 55-60%) -HF type: Diastolic -Current treatment: Amlodipine 5 mg daily Carvedilol 3.125 mg bid with a meal Furosemide 40 mg twice weekly Valsartan 320 mg daily Potassium 10 meq daily prn  Nitroglycerin 0.4 mg sl every 5 minutes prn chest pain  -Medications previously tried: diltiazem   -Current home BP/HR readings: none reported/not checking  at home -Current dietary habits: denies specific diet. Eats sandwiches some and wife cooks some.  -Current exercise habits: no regular exercise -Educated on Benefits of medications for managing symptoms and prolonging life Proper diuretic administration and potassium supplementation Importance of blood pressure control -Counseled on diet and exercise extensively Recommended to continue current medication  Hypothyroid (Goal: manage TSH/Symptoms ) -Not ideally controlled at last visit - labs rechecked with 3 month follow-up -Current treatment  levothyroxine 150 mcg daily  -Medications previously tried: none reported  -Recommended to continue current medication    Memory (Goal: manage memory) -Controlled -Current treatment  donepezil 10 mg daily  -Medications previously tried: none reported  -Recommended to continue current medication  Health Maintenance -Vaccine gaps: recommended 4th COVID -Current therapy:  Fiber choice daily  Vitamin D 50,000 units every Saturday am  Zolpidem 10 mg daily at bedtime -Patient is satisfied with current therapy and denies issues -Counseled on diet and exercise extensively Recommended to continue current medication   Patient Goals/Self-Care Activities Patient will:  - take medications as prescribed focus on medication adherence by considering using a pill box target a minimum of 150 minutes of moderate intensity exercise weekly engage in dietary modifications by limiting salt and focusing on heart-healthy diet.   Follow Up Plan: Telephone follow up appointment with care management team member scheduled for: June 2023  [name], [place].D. - [phone]       Medication Assistance: Application for Eliquis  medication assistance program. in process.  Anticipated assistance start date 12/2020.  See plan of care for additional detail.  Patient's preferred pharmacy is:  [id number] [id number]: [phone]: [address] [address]Phone: [phone]: [phone] Uses pill box? No - keeps medications together.  Pt endorses good compliance - had stopped donepezil  We discussed: Current pharmacy is preferred with insurance plan  and patient is satisfied with pharmacy services Patient decided to: Continue current medication management strategy  Care Plan and Follow Up Patient Decision:  Patient agrees to Care Plan and Follow-up.  Plan: Telephone follow up appointment with care management team member scheduled for:  June 2023

## 2021-06-07 NOTE — Patient Instructions (Signed)
Visit Information   Goals Addressed   None    Patient Care Plan: CCM Pharmacy Care Plan     Problem Identified: htn, hld, chf, afib   Priority: High  Onset Date: 12/07/2020     Long-Range Goal: Disease Management   Start Date: 12/07/2020  Expected End Date: 12/07/2021  Recent Progress: On track  Priority: High  Note:     Current Barriers:  Unable to independently afford treatment regimen  Pharmacist Clinical Goal(s):  Patient will verbalize ability to afford treatment regimen through collaboration with PharmD and provider.   Interventions: 1:1 collaboration with Cox, Elnita Maxwell, MD regarding development and update of comprehensive plan of care as evidenced by provider attestation and co-signature Inter-disciplinary care team collaboration (see longitudinal plan of care) Comprehensive medication review performed; medication list updated in electronic medical record  Hypertension (BP goal <130/80) -Controlled -Current treatment: Amlodipine 5 mg bid Carvedilol 3.125 mg bid with a meal Furosemide 40 mg twice weekly Valsartan 320 mg daily Potassium 10 meq daily prn  -Medications previously tried: diltiazem  -Current home readings: not checking -Current dietary habits: sandwich, cookies, home cooking -Current exercise habits: no regular exercise -Denies hypotensive/hypertensive symptoms -Educated on BP goals and benefits of medications for prevention of heart attack, stroke and kidney damage; Daily salt intake goal < 2300 mg; Exercise goal of 150 minutes per week; Importance of home blood pressure monitoring; -Counseled to monitor BP at home weekly, document, and provide log at future appointments -Counseled on diet and exercise extensively Recommended to continue current medication Recommended beginning to check bp weekly at home   Hyperlipidemia: (LDL goal < 55) -Not ideally controlled -Current treatment: atorvastatin 10 mg daily at bedtime -Medications previously  tried: none reported  -Current dietary patterns: no specific diet.  -Current exercise habits: no regular exercise -Educated on Cholesterol goals;  Benefits of statin for ASCVD risk reduction; Importance of limiting foods high in cholesterol; Exercise goal of 150 minutes per week; -Counseled on diet and exercise extensively Recommended to continue current medication  Atrial Fibrillation (Goal: prevent stroke and major bleeding) -Controlled -CHADSVASC: 5 -Current treatment: Rate control: amiodarone 200 mg daily, carvedilol 3.25 mg bid  Anticoagulation:  apixaban 5 mg bid -Medications previously tried: diltiazem -Home BP and HR readings: not checking currently  -Counseled on increased risk of stroke due to Afib and benefits of anticoagulation for stroke prevention; importance of adherence to anticoagulant exactly as prescribed; bleeding risk associated with Eliquis and importance of self-monitoring for signs/symptoms of bleeding; avoidance of NSAIDs due to increased bleeding risk with anticoagulants; Coordinating patient assistance application for Eliquis and samples of Eliquis. -Recommended to continue current medication April 2022: Assessed patient finances. Patient has not spent 3% of income on medication at this time. Will continue to check for Eliquis PAP application October 4765: Will have team check on status of PAP, if not approved, will consider talking with PCP/Specialist about Xarelto PAP if it's possible to have that one approved easier than Eliquis. Patient told me he asked Cardio about this as well    Heart Failure (Goal: manage symptoms and prevent exacerbations) -Controlled -Last ejection fraction: 08/18 (Date: 55-60%) -HF type: Diastolic -Current treatment: Amlodipine 5 mg daily Carvedilol 3.125 mg bid with a meal Furosemide 40 mg twice weekly Valsartan 320 mg daily Potassium 10 meq daily prn  Nitroglycerin 0.4 mg sl every 5 minutes prn chest pain  -Medications  previously tried: diltiazem   -Current home BP/HR readings: none reported/not checking at home -Current dietary habits: denies  specific diet. Eats sandwiches some and wife cooks some.  -Current exercise habits: no regular exercise -Educated on Benefits of medications for managing symptoms and prolonging life Proper diuretic administration and potassium supplementation Importance of blood pressure control -Counseled on diet and exercise extensively Recommended to continue current medication  Hypothyroid (Goal: manage TSH/Symptoms ) -Not ideally controlled at last visit - labs rechecked with 3 month follow-up -Current treatment  levothyroxine 150 mcg daily  -Medications previously tried: none reported  -Recommended to continue current medication    Memory (Goal: manage memory) -Controlled -Current treatment  donepezil 10 mg daily  -Medications previously tried: none reported  -Recommended to continue current medication  Health Maintenance -Vaccine gaps: recommended 4th COVID -Current therapy:  Fiber choice daily  Vitamin D 50,000 units every Saturday am  Zolpidem 10 mg daily at bedtime -Patient is satisfied with current therapy and denies issues -Counseled on diet and exercise extensively Recommended to continue current medication   Patient Goals/Self-Care Activities Patient will:  - take medications as prescribed focus on medication adherence by considering using a pill box target a minimum of 150 minutes of moderate intensity exercise weekly engage in dietary modifications by limiting salt and focusing on heart-healthy diet.   Follow Up Plan: Telephone follow up appointment with care management team member scheduled for: June 2023  [name], [place].D. - [phone]       The patient verbalized understanding of instructions, educational materials, and care plan provided today and declined offer to receive copy of patient instructions, educational materials, and  care plan.  The pharmacy team will reach out to the patient again over the next 90 days.   Lane Hacker, [facility]

## 2021-06-11 ENCOUNTER — Telehealth: Payer: Self-pay

## 2021-06-11 NOTE — Chronic Care Management (AMB) (Signed)
Chronic Care Management Pharmacy Assistant   Name: John Kemp  MRN: [id number] DOB: [birth year]  Reason for Encounter: Patient Assistance Coordination  06/11/2021- [place] patient assistance Kemp to check on the status of patient's application for Eliquis that was sent on 05/26/2021. Spoke with representative John Kemp, she informed me that patient did not meet the annual income criteria, he met the out of pocket expense criteria but per household of 2 patient income exceeds eligibility. John Kemp, Pharm D informed to move to next step with patient on getting Xarelto.  Checking with John Kemp and John Kemp to see what their income requirements are for Xarelto, spoke with representative, who informed that patients annual income requirements for Xarelto has to be below 54,000 for a household of 2 to become eligible for Xarelto through patient assistance. Unfortunately patient makes a little above this, sending task to John Kemp, Pharm D for follow up instructions.   06/28/2021- Called patient to discuss eligibility requirements for Eliquis and Xarelto, spoke with wife, she was aware of the medications mentioned and patient was not at the home at the time. Wife stated she spoke with John Kemp (Cardiologist) regarding medications above and was informed that John Kemp Pharmacist was working on another medication for patient. Informed wife that it was for the Xarelto and unfortunately patient is not eligible for either medication for assistance due to income criteria. As we were talking patient arrived to the home, spoke with patient regarding eligibility requirement for both Eliquis and Xarelto. Patient mentioned that with this application his taxable income was higher than it usually is due to them needing to take out of their 401K. Patient says their income together is below 54, 0000. Patient stated that he like Eliquis, he has  no problems with this drug, his insurance will usually cover for $42 a month until he falls into the donut hole and it cost him $100 a month. Which he is not always able to afford. Patient had questions about coupon cards available, informed patient that there may be some coupon cards available but most of them are for commercial insurance patient, he may also check on [facility] to see if there is a lower cash price for Eliquis, where they do not file his insurance. I encouraged patient to retry to apply next after he filed his income taxes with the correct amount of taxable income for him and his wife. Patient will follow up Cardiology for medication changes. questions or coupons.    Medications: Outpatient Encounter Medications as of 06/11/2021  Medication Sig   albuterol (PROVENTIL) (2.5 MG/3ML) 0.083% nebulizer solution Take 2.5 mg by nebulization every 6 (six) hours as needed for wheezing or shortness of breath.   amiodarone (PACERONE) 200 MG tablet TAKE 1 TABLET BY MOUTH EVERY DAY   amLODipine (NORVASC) 5 MG tablet Take 1 tablet (5 mg total) by mouth daily.   atorvastatin (LIPITOR) 10 MG tablet Take 1 tablet (10 mg total) by mouth at bedtime.   carvedilol (COREG) 3.125 MG tablet TAKE 1 TABLET (3.125 MG TOTAL) BY MOUTH 2 (TWO) TIMES DAILY WITH A MEAL.   donepezil (ARICEPT) 10 MG tablet TAKE 1 TABLET BY MOUTH EVERYDAY AT BEDTIME   ELIQUIS 5 MG TABS tablet TAKE 1 TABLET BY MOUTH TWICE A DAY (Patient taking differently: Take 5 mg by mouth 2 (two) times daily.)   furosemide (LASIX) 40 MG tablet Take 1 tablet twice weekly on Wednesday and Saturday. (Patient taking  differently: Take 40 mg by mouth See admin instructions. Take 40 mg by mouth as needed for feet swelling)   Inulin (FIBER CHOICE PO) Take 3 tablets by mouth daily.   levothyroxine (SYNTHROID) 150 MCG tablet TAKE 1 TABLET BY MOUTH EVERY DAY (Patient taking differently: 150 mcg daily before breakfast.)   nitroGLYCERIN (NITROSTAT) 0.4 MG SL  tablet Place 1 tablet (0.4 mg total) under the tongue every 5 (five) minutes as needed for chest pain.   potassium chloride (K-DUR,KLOR-CON) 10 MEQ tablet Take 10 mEq by mouth See admin instructions. Take 10 mEq by mouth whenever you take furosemide   tamsulosin (FLOMAX) 0.4 MG CAPS capsule Take 1 capsule (0.4 mg total) by mouth daily.   valsartan (DIOVAN) 320 MG tablet TAKE ONE TABLET BY MOUTH DAILY (Patient taking differently: Take 320 mg by mouth daily.)   Vitamin D, Ergocalciferol, (DRISDOL) 1.25 MG (50000 UNIT) CAPS capsule TAKE 1 CAPSULE (50,000 UNITS TOTAL) BY MOUTH EVERY SATURDAY. MORNING.   zolpidem (AMBIEN) 10 MG tablet TAKE ONE TABLET BY MOUTH AT BEDTIME   zolpidem (AMBIEN) 10 MG tablet Take 1 tablet (10 mg total) by mouth at bedtime as needed for sleep.   No facility-administered encounter medications on file as of 06/11/2021.    John Kemp, [place] Pharmacist Assistant [phone]

## 2021-06-22 ENCOUNTER — Other Ambulatory Visit: Payer: Self-pay

## 2021-06-22 MED ORDER — DONEPEZIL HCL 10 MG PO TABS: ORAL_TABLET | ORAL | 1 refills | Status: DC

## 2021-06-22 NOTE — Telephone Encounter (Signed)
Refill sent to pharmacy.   

## 2021-06-28 DIAGNOSIS — E039 Hypothyroidism, unspecified: Secondary | ICD-10-CM

## 2021-06-28 DIAGNOSIS — I482 Chronic atrial fibrillation, unspecified: Secondary | ICD-10-CM

## 2021-06-28 DIAGNOSIS — I110 Hypertensive heart disease with heart failure: Secondary | ICD-10-CM | POA: Diagnosis not present

## 2021-06-28 DIAGNOSIS — I5032 Chronic diastolic (congestive) heart failure: Secondary | ICD-10-CM

## 2021-06-28 DIAGNOSIS — E782 Mixed hyperlipidemia: Secondary | ICD-10-CM

## 2021-07-08 ENCOUNTER — Other Ambulatory Visit: Payer: Self-pay | Admitting: Physician Assistant

## 2021-07-26 ENCOUNTER — Encounter: Payer: Self-pay | Admitting: Family Medicine

## 2021-07-26 ENCOUNTER — Ambulatory Visit (INDEPENDENT_AMBULATORY_CARE_PROVIDER_SITE_OTHER): Payer: Medicare Other | Admitting: Family Medicine

## 2021-07-26 VITALS — BP 140/68 | HR 59 | Temp 98.1°F | Ht 70.0 in | Wt 238.0 lb

## 2021-07-26 DIAGNOSIS — J09X3 Influenza due to identified novel influenza A virus with gastrointestinal manifestations: Secondary | ICD-10-CM | POA: Diagnosis not present

## 2021-07-26 DIAGNOSIS — R059 Cough, unspecified: Secondary | ICD-10-CM | POA: Diagnosis not present

## 2021-07-26 LAB — POCT INFLUENZA A/B
Influenza A, POC: POSITIVE — AB
Influenza B, POC: NEGATIVE

## 2021-07-26 LAB — POC COVID19 BINAXNOW: SARS Coronavirus 2 Ag: NEGATIVE

## 2021-07-26 MED ORDER — ONDANSETRON HCL 4 MG PO TABS: 4.0000 mg | ORAL_TABLET | Freq: Three times a day (TID) | ORAL | 0 refills | Status: DC | PRN

## 2021-07-26 MED ORDER — ALBUTEROL SULFATE (2.5 MG/3ML) 0.083% IN NEBU: 2.5000 mg | INHALATION_SOLUTION | Freq: Four times a day (QID) | RESPIRATORY_TRACT | 2 refills | Status: DC | PRN

## 2021-07-26 NOTE — Assessment & Plan Note (Signed)
Recommend rest, fluids.  May continue mucinex dm for cough  Rx for zofran sent for nausea.

## 2021-07-26 NOTE — Progress Notes (Signed)
Acute Office Visit  Subjective:    Patient ID: John Kemp, male    DOB: [birth year], 75 y.o.   MRN: [id number]  Chief Complaint  Patient presents with   Cough    Chest Congestion/Sinuses/Vomiting/Nausea/not wanting to eat     Patient is in today for Nausea/Vomiting. Patient's symptoms started initially Friday/Saturday and first started off with Nausea/Vomiting then added coughing/fever with runny nose. Patient has not had any exposures or been around anyone sick with COVID, also claims no other people in his house hold are sick.  Past Medical History:  Diagnosis Date   A-fib ([place])    Anticoagulated 01/21/2015   Atherosclerotic heart disease of native coronary artery without angina pectoris    Blepharitis of left lower eyelid 12/02/2019   CAD in native artery 01/21/2015   Cancer ([place])    skin cancer   Chest pain 04/02/2017   CHF (congestive heart failure) ([place]) 11/05/2019   Chronic diastolic (congestive) heart failure (HCC)    Chronic diastolic heart failure ([place]) 01/21/2015   Last Assessment & Plan:  Is congestive heart failure is mildly decompensated he is edematous he'll increase the dose of his diuretic today the lab work performed including an ARB referred to care transitions for home care he'll follow-up in my office with me in 4-6 weeks daily weights sodium restrictionand compliance of medications beinghemoglobin of will   Chronic obstructive pulmonary disease (COPD) ([place])    Dyspnea 12/31/2014   Followed in Pulmonary clinic/ [facility]/ [facility]  - amiodarone rx 11/2014 >>> - PFTs rec 12/31/2014     Heart attack ([place])    High cholesterol 10/20/2017   Hx of CABG 03/02/2017   Hyperlipidemia    Hypertension    Hypertensive heart disease 01/21/2015   Hypertensive heart disease with heart failure (HCC)    Hypothyroidism    Hypothyroidism (acquired) 04/24/2018   Impaired fasting glucose    Leg fatigue 03/03/2017   Obesity 04/02/2017   On amiodarone therapy 01/21/2015   PAF  (paroxysmal atrial fibrillation) ([place])    Primary insomnia    Squamous cell carcinoma 11/05/2019   Stroke (cerebrum) ([place]) 09/2016   right superior quadrantanopsia   Stroke ([place])    Typical atrial flutter ([place]) 01/21/2015   Vitamin D deficiency, unspecified     Past Surgical History:  Procedure Laterality Date   APPENDECTOMY  1956   CARDIAC CATHETERIZATION     CARDIOVERSION  2002   CHOLECYSTECTOMY  2006   CORONARY ARTERY BYPASS GRAFT  2001   HERNIA REPAIR     RADIOLOGY WITH ANESTHESIA Bilateral 01/18/2018   Procedure: MRI WITH ANESTHESIA LUMBAR SPINE WITH AND WITHOUT CONTRAST;  Surgeon: Radiologist, Medication, MD;  Location: [facility];  Service: Radiology;  Laterality: Bilateral;   ROTATOR CUFF REPAIR Left    TONSILLECTOMY  1975    Family History  Problem Relation Age of Onset   Heart disease Father    Arrhythmia Father    Breast cancer Mother    Diabetes type II Sister    Diabetes Brother     Social History   Socioeconomic History   Marital status: Married    Spouse name: Not on file   Number of children: Not on file   Years of education: Not on file   Highest education level: Not on file  Occupational History   Occupation: Retired  Tobacco Use   Smoking status: Former    Packs/day: 1.00    Years: 15.00    Pack years: 15.00  Types: Cigarettes    Quit date: 08/29/1978    Years since quitting: 42.9   Smokeless tobacco: Never  Vaping Use   Vaping Use: Never used  Substance and Sexual Activity   Alcohol use: No    Alcohol/week: 0.0 standard drinks   Drug use: No   Sexual activity: Not Currently  Other Topics Concern   Not on file  Social History Narrative   Not on file   Social Determinants of Health   Financial Resource Strain: High Risk   Difficulty of Paying Living Expenses: Very hard  Food Insecurity: No Food Insecurity   Worried About Charity fundraiser in the Last Year: Never true   Ran Out of Food in the Last Year: Never true  Transportation Needs:  Not on file  Physical Activity: Not on file  Stress: Not on file  Social Connections: Not on file  Intimate Partner Violence: Not on file    Outpatient Medications Prior to Visit  Medication Sig Dispense Refill   amiodarone (PACERONE) 200 MG tablet TAKE 1 TABLET BY MOUTH EVERY DAY 90 tablet 2   amLODipine (NORVASC) 5 MG tablet Take 1 tablet (5 mg total) by mouth daily. 90 tablet 2   atorvastatin (LIPITOR) 10 MG tablet Take 1 tablet (10 mg total) by mouth at bedtime. 90 tablet 1   carvedilol (COREG) 3.125 MG tablet TAKE 1 TABLET (3.125 MG TOTAL) BY MOUTH 2 (TWO) TIMES DAILY WITH A MEAL. 180 tablet 2   donepezil (ARICEPT) 10 MG tablet TAKE 1 TABLET BY MOUTH EVERYDAY AT BEDTIME 90 tablet 1   ELIQUIS 5 MG TABS tablet TAKE 1 TABLET BY MOUTH TWICE A DAY (Patient taking differently: Take 5 mg by mouth 2 (two) times daily.) 180 tablet 1   furosemide (LASIX) 40 MG tablet Take 1 tablet twice weekly on Wednesday and Saturday. (Patient taking differently: Take 40 mg by mouth See admin instructions. Take 40 mg by mouth as needed for feet swelling) 24 tablet 3   Inulin (FIBER CHOICE PO) Take 3 tablets by mouth daily.     levothyroxine (SYNTHROID) 150 MCG tablet Take 1 tablet (150 mcg total) by mouth daily before breakfast. 90 tablet 3   potassium chloride (K-DUR,KLOR-CON) 10 MEQ tablet Take 10 mEq by mouth See admin instructions. Take 10 mEq by mouth whenever you take furosemide     tamsulosin (FLOMAX) 0.4 MG CAPS capsule Take 1 capsule (0.4 mg total) by mouth daily. 90 capsule 1   valsartan (DIOVAN) 320 MG tablet TAKE ONE TABLET BY MOUTH DAILY (Patient taking differently: Take 320 mg by mouth daily.) 90 tablet 3   Vitamin D, Ergocalciferol, (DRISDOL) 1.25 MG (50000 UNIT) CAPS capsule TAKE 1 CAPSULE (50,000 UNITS TOTAL) BY MOUTH EVERY SATURDAY. MORNING. 12 capsule 0   zolpidem (AMBIEN) 10 MG tablet Take 1 tablet (10 mg total) by mouth at bedtime as needed for sleep. 90 tablet 1   albuterol (PROVENTIL) (2.5  MG/3ML) 0.083% nebulizer solution Take 2.5 mg by nebulization every 6 (six) hours as needed for wheezing or shortness of breath.     nitroGLYCERIN (NITROSTAT) 0.4 MG SL tablet Place 1 tablet (0.4 mg total) under the tongue every 5 (five) minutes as needed for chest pain. 25 tablet 5   zolpidem (AMBIEN) 10 MG tablet TAKE ONE TABLET BY MOUTH AT BEDTIME 30 tablet 0   No facility-administered medications prior to visit.    Allergies  Allergen Reactions   Avelox [Moxifloxacin Hcl]    Cartia Xt [Diltiazem  Hcl] Nausea And Vomiting   Pravachol [Pravastatin Sodium] Other (See Comments)    myalgia   Prednisone Other (See Comments)    "makes me feel terrible"   Zetia [Ezetimibe] Other (See Comments)    myalgias    Review of Systems  Constitutional:  Positive for fatigue and fever. Negative for appetite change.  HENT:  Positive for congestion, sinus pressure and sinus pain. Negative for ear pain and sore throat.   Respiratory:  Positive for cough. Negative for shortness of breath.   Cardiovascular:  Negative for chest pain and leg swelling.  Gastrointestinal:  Positive for abdominal pain, diarrhea, nausea and vomiting. Negative for constipation.  Genitourinary:  Negative for dysuria and frequency.  Musculoskeletal:  Negative for arthralgias and myalgias.  Neurological:  Positive for headaches. Negative for dizziness.  Psychiatric/Behavioral:  Negative for dysphoric mood. The patient is not nervous/anxious.       Objective:    Physical Exam Vitals reviewed.  Constitutional:      Appearance: Normal appearance. He is normal weight.  HENT:     Right Ear: Tympanic membrane, ear canal and external ear normal.     Left Ear: Tympanic membrane, ear canal and external ear normal.     Nose: Rhinorrhea present. No congestion.     Mouth/Throat:     Mouth: Mucous membranes are moist.     Pharynx: No oropharyngeal exudate or posterior oropharyngeal erythema.  Cardiovascular:     Rate and Rhythm:  Normal rate and regular rhythm.     Heart sounds: Normal heart sounds.  Pulmonary:     Effort: Pulmonary effort is normal. No respiratory distress.     Breath sounds: Normal breath sounds. No wheezing, rhonchi or rales.  Abdominal:     General: Bowel sounds are normal.     Palpations: Abdomen is soft.     Tenderness: There is no abdominal tenderness.  Neurological:     Mental Status: He is alert and oriented to person, place, and time.  Psychiatric:        Mood and Affect: Mood normal.        Behavior: Behavior normal.    BP 140/68 (BP Location: Right Arm, Patient Position: Sitting)   Pulse (!) 59   Temp 98.1 F (36.7 C) (Temporal)   Ht '5\' 10"'  (1.778 m)   Wt 238 lb (108 kg)   SpO2 98%   BMI 34.15 kg/m  Wt Readings from Last 3 Encounters:  07/26/21 238 lb (108 kg)  05/11/21 238 lb 3.2 oz (108 kg)  04/23/21 242 lb (109.8 kg)    Health Maintenance Due  Topic Date Due   Hepatitis C Screening  Never done   Zoster Vaccines- Shingrix (1 of 2) Never done   COVID-19 Vaccine (3 - Moderna risk series) 01/15/2021    There are no preventive care reminders to display for this patient.   Lab Results  Component Value Date   TSH 2.050 12/22/2020   Lab Results  Component Value Date   WBC 5.8 04/23/2021   HGB 14.5 04/23/2021   HCT 42.5 04/23/2021   MCV 90 04/23/2021   PLT 190 04/23/2021   Lab Results  Component Value Date   NA 141 04/23/2021   K 3.8 04/23/2021   CO2 24 04/23/2021   GLUCOSE 94 04/23/2021   BUN 15 04/23/2021   CREATININE 1.26 04/23/2021   BILITOT 1.0 04/23/2021   ALKPHOS 95 04/23/2021   AST 22 04/23/2021   ALT 21 04/23/2021   PROT  6.8 04/23/2021   ALBUMIN 4.5 04/23/2021   CALCIUM 9.3 04/23/2021   ANIONGAP 11 02/18/2021   EGFR 61 04/23/2021   Lab Results  Component Value Date   CHOL 134 04/23/2021   Lab Results  Component Value Date   HDL 39 (L) 04/23/2021   Lab Results  Component Value Date   LDLCALC 76 04/23/2021   Lab Results   Component Value Date   TRIG 99 04/23/2021   Lab Results  Component Value Date   CHOLHDL 3.4 04/23/2021   Lab Results  Component Value Date   HGBA1C 5.4 04/23/2021         Assessment & Plan:   Problem List Items Addressed This Visit       Digestive   Gastroenteritis due to influenza A virus infection    Recommend rest, fluids.  May continue mucinex dm for cough  Rx for zofran sent for nausea.       Relevant Medications   ondansetron (ZOFRAN) 4 MG tablet   Other Visit Diagnoses     Cough, unspecified type    -  Primary   Relevant Orders   POC COVID-19 BinaxNow (Completed)   POCT Influenza A/B (Completed)        Meds ordered this encounter  Medications   ondansetron (ZOFRAN) 4 MG tablet    Sig: Take 1 tablet (4 mg total) by mouth every 8 (eight) hours as needed for nausea or vomiting.    Dispense:  30 tablet    Refill:  0   albuterol (PROVENTIL) (2.5 MG/3ML) 0.083% nebulizer solution    Sig: Take 3 mLs (2.5 mg total) by nebulization every 6 (six) hours as needed for wheezing or shortness of breath.    Dispense:  75 mL    Refill:  2     I,Lauren M Auman,acting as a scribe for [name], MD.,have documented all relevant documentation on the behalf of [name], MD,as directed by  [name], MD while in the presence of [name], MD.    [name], MD

## 2021-08-04 ENCOUNTER — Ambulatory Visit (INDEPENDENT_AMBULATORY_CARE_PROVIDER_SITE_OTHER): Payer: Medicare Other | Admitting: Family Medicine

## 2021-08-04 ENCOUNTER — Encounter: Payer: Self-pay | Admitting: Family Medicine

## 2021-08-04 VITALS — BP 138/68 | HR 52 | Temp 98.1°F | Ht 70.0 in | Wt 229.0 lb

## 2021-08-04 DIAGNOSIS — R052 Subacute cough: Secondary | ICD-10-CM

## 2021-08-04 DIAGNOSIS — J189 Pneumonia, unspecified organism: Secondary | ICD-10-CM

## 2021-08-04 DIAGNOSIS — J449 Chronic obstructive pulmonary disease, unspecified: Secondary | ICD-10-CM

## 2021-08-04 MED ORDER — TRIAMCINOLONE ACETONIDE 40 MG/ML IJ SUSP
80.0000 mg | Freq: Once | INTRAMUSCULAR | Status: AC
  Administered 2021-08-04: 80 mg via INTRAMUSCULAR

## 2021-08-04 MED ORDER — CEFTRIAXONE SODIUM 1 G IJ SOLR
1.0000 g | Freq: Once | INTRAMUSCULAR | Status: AC
Start: 2021-08-04 — End: 2021-08-04
  Administered 2021-08-04: 1 g via INTRAMUSCULAR

## 2021-08-04 MED ORDER — DOXYCYCLINE HYCLATE 100 MG PO TABS: 100.0000 mg | ORAL_TABLET | Freq: Two times a day (BID) | ORAL | 0 refills | Status: DC

## 2021-08-04 MED ORDER — CEFDINIR 300 MG PO CAPS: 300.0000 mg | ORAL_CAPSULE | Freq: Two times a day (BID) | ORAL | 0 refills | Status: DC

## 2021-08-04 NOTE — Progress Notes (Signed)
Acute Office Visit  Subjective:    Patient ID: John Kemp, male    DOB: Feb 13, 1946, 75 y.o.   MRN: [id number]  Chief Complaint  Patient presents with   Cough    Wheezing/Head and chest congestion after having the FLU   HPI: Pt presents with sxs below. Pt was diagnosed with Flu on 07/26/2021.  Cough Associated symptoms include chills, a fever and wheezing. Pertinent negatives include no chest pain, ear pain, headaches, myalgias, sore throat or shortness of breath.    Past Medical History:  Diagnosis Date   A-fib ([place])    Anticoagulated 01/21/2015   Atherosclerotic heart disease of native coronary artery without angina pectoris    Blepharitis of left lower eyelid 12/02/2019   CAD in native artery 01/21/2015   Cancer ([place])    skin cancer   Chest pain 04/02/2017   CHF (congestive heart failure) ([place]) 11/05/2019   Chronic diastolic (congestive) heart failure (HCC)    Chronic diastolic heart failure ([place]) 01/21/2015   Last Assessment & Plan:  Is congestive heart failure is mildly decompensated he is edematous he'll increase the dose of his diuretic today the lab work performed including an ARB referred to care transitions for home care he'll follow-up in my office with me in 4-6 weeks daily weights sodium restrictionand compliance of medications beinghemoglobin of will   Chronic obstructive pulmonary disease (COPD) ([place])    Dyspnea 12/31/2014   Followed in Pulmonary clinic/ [facility]/ [facility]  - amiodarone rx 11/2014 >>> - PFTs rec 12/31/2014     Heart attack ([place])    High cholesterol 10/20/2017   Hx of CABG 03/02/2017   Hyperlipidemia    Hypertension    Hypertensive heart disease 01/21/2015   Hypertensive heart disease with heart failure (HCC)    Hypothyroidism    Hypothyroidism (acquired) 04/24/2018   Impaired fasting glucose    Leg fatigue 03/03/2017   Obesity 04/02/2017   On amiodarone therapy 01/21/2015   PAF (paroxysmal atrial fibrillation) ([place])    Primary insomnia     Squamous cell carcinoma 11/05/2019   Stroke (cerebrum) ([place]) 09/2016   right superior quadrantanopsia   Stroke ([place])    Typical atrial flutter ([place]) 01/21/2015   Vitamin D deficiency, unspecified     Past Surgical History:  Procedure Laterality Date   APPENDECTOMY  1956   CARDIAC CATHETERIZATION     CARDIOVERSION  2002   CHOLECYSTECTOMY  2006   CORONARY ARTERY BYPASS GRAFT  2001   HERNIA REPAIR     RADIOLOGY WITH ANESTHESIA Bilateral 01/18/2018   Procedure: MRI WITH ANESTHESIA LUMBAR SPINE WITH AND WITHOUT CONTRAST;  Surgeon: Radiologist, Medication, MD;  Location: [facility];  Service: Radiology;  Laterality: Bilateral;   ROTATOR CUFF REPAIR Left    TONSILLECTOMY  1975    Family History  Problem Relation Age of Onset   Heart disease Father    Arrhythmia Father    Breast cancer Mother    Diabetes type II Sister    Diabetes Brother     Social History   Socioeconomic History   Marital status: Married    Spouse name: Not on file   Number of children: Not on file   Years of education: Not on file   Highest education level: Not on file  Occupational History   Occupation: Retired  Tobacco Use   Smoking status: Former    Packs/day: 1.00    Years: 15.00    Pack years: 15.00    Types: Cigarettes  Quit date: 08/29/1978    Years since quitting: 42.9   Smokeless tobacco: Never  Vaping Use   Vaping Use: Never used  Substance and Sexual Activity   Alcohol use: No    Alcohol/week: 0.0 standard drinks   Drug use: No   Sexual activity: Not Currently  Other Topics Concern   Not on file  Social History Narrative   Not on file   Social Determinants of Health   Financial Resource Strain: High Risk   Difficulty of Paying Living Expenses: Very hard  Food Insecurity: No Food Insecurity   Worried About Charity fundraiser in the Last Year: Never true   Ran Out of Food in the Last Year: Never true  Transportation Needs: Not on file  Physical Activity: Not on file  Stress: Not on  file  Social Connections: Not on file  Intimate Partner Violence: Not on file    Outpatient Medications Prior to Visit  Medication Sig Dispense Refill   albuterol (PROVENTIL) (2.5 MG/3ML) 0.083% nebulizer solution Take 3 mLs (2.5 mg total) by nebulization every 6 (six) hours as needed for wheezing or shortness of breath. 75 mL 2   amiodarone (PACERONE) 200 MG tablet TAKE 1 TABLET BY MOUTH EVERY DAY 90 tablet 2   amLODipine (NORVASC) 5 MG tablet Take 1 tablet (5 mg total) by mouth daily. 90 tablet 2   atorvastatin (LIPITOR) 10 MG tablet Take 1 tablet (10 mg total) by mouth at bedtime. 90 tablet 1   carvedilol (COREG) 3.125 MG tablet TAKE 1 TABLET (3.125 MG TOTAL) BY MOUTH 2 (TWO) TIMES DAILY WITH A MEAL. 180 tablet 2   donepezil (ARICEPT) 10 MG tablet TAKE 1 TABLET BY MOUTH EVERYDAY AT BEDTIME 90 tablet 1   Inulin (FIBER CHOICE PO) Take 3 tablets by mouth daily.     levothyroxine (SYNTHROID) 150 MCG tablet Take 1 tablet (150 mcg total) by mouth daily before breakfast. 90 tablet 3   ondansetron (ZOFRAN) 4 MG tablet Take 1 tablet (4 mg total) by mouth every 8 (eight) hours as needed for nausea or vomiting. 30 tablet 0   potassium chloride (K-DUR,KLOR-CON) 10 MEQ tablet Take 10 mEq by mouth See admin instructions. Take 10 mEq by mouth whenever you take furosemide     tamsulosin (FLOMAX) 0.4 MG CAPS capsule Take 1 capsule (0.4 mg total) by mouth daily. 90 capsule 1   valsartan (DIOVAN) 320 MG tablet TAKE ONE TABLET BY MOUTH DAILY (Patient taking differently: Take 320 mg by mouth daily.) 90 tablet 3   Vitamin D, Ergocalciferol, (DRISDOL) 1.25 MG (50000 UNIT) CAPS capsule TAKE 1 CAPSULE (50,000 UNITS TOTAL) BY MOUTH EVERY SATURDAY. MORNING. 12 capsule 0   zolpidem (AMBIEN) 10 MG tablet Take 1 tablet (10 mg total) by mouth at bedtime as needed for sleep. 90 tablet 1   ELIQUIS 5 MG TABS tablet TAKE 1 TABLET BY MOUTH TWICE A DAY 180 tablet 1   furosemide (LASIX) 40 MG tablet Take 1 tablet twice weekly on  Wednesday and Saturday. (Patient taking differently: Take 40 mg by mouth See admin instructions. Take 40 mg by mouth as needed for feet swelling) 24 tablet 3   nitroGLYCERIN (NITROSTAT) 0.4 MG SL tablet Place 1 tablet (0.4 mg total) under the tongue every 5 (five) minutes as needed for chest pain. 25 tablet 5   No facility-administered medications prior to visit.    Allergies  Allergen Reactions   Avelox [Moxifloxacin Hcl]    Cartia Xt [Diltiazem Hcl] Nausea  And Vomiting   Pravachol [Pravastatin Sodium] Other (See Comments)    myalgia   Prednisone Other (See Comments)    "makes me feel terrible"   Zetia [Ezetimibe] Other (See Comments)    myalgias    Review of Systems  Constitutional:  Positive for chills, fatigue and fever.  HENT:  Positive for congestion (Head/Chest), sinus pressure and sinus pain. Negative for ear pain and sore throat.   Respiratory:  Positive for cough and wheezing. Negative for shortness of breath.   Cardiovascular:  Negative for chest pain and leg swelling.  Gastrointestinal:  Negative for abdominal pain, constipation, diarrhea, nausea and vomiting.  Genitourinary:  Negative for dysuria and frequency.  Musculoskeletal:  Negative for arthralgias and myalgias.  Neurological:  Negative for dizziness and headaches.  Psychiatric/Behavioral:  Negative for dysphoric mood. The patient is not nervous/anxious.       Objective:    Physical Exam Constitutional:      Appearance: Normal appearance.  HENT:     Right Ear: Tympanic membrane, ear canal and external ear normal.     Left Ear: Tympanic membrane, ear canal and external ear normal.     Nose: Congestion present. No rhinorrhea.     Mouth/Throat:     Mouth: Mucous membranes are moist.     Pharynx: No oropharyngeal exudate or posterior oropharyngeal erythema.  Cardiovascular:     Rate and Rhythm: Normal rate and regular rhythm.     Heart sounds: Normal heart sounds.  Pulmonary:     Effort: Pulmonary effort  is normal. No respiratory distress.     Breath sounds: Rhonchi present. No wheezing or rales.  Abdominal:     Tenderness: There is no abdominal tenderness.  Lymphadenopathy:     Cervical: No cervical adenopathy.  Neurological:     Mental Status: He is alert.  Psychiatric:        Mood and Affect: Mood normal.        Behavior: Behavior normal.    BP 138/68 (BP Location: Right Arm, Patient Position: Sitting)   Pulse (!) 52   Temp 98.1 F (36.7 C) (Temporal)   Ht 5' 10" (1.778 m)   Wt 229 lb (103.9 kg)   SpO2 97%   BMI 32.86 kg/m  Wt Readings from Last 3 Encounters:  08/04/21 229 lb (103.9 kg)  07/26/21 238 lb (108 kg)  05/11/21 238 lb 3.2 oz (108 kg)    Health Maintenance Due  Topic Date Due   Hepatitis C Screening  Never done   Zoster Vaccines- Shingrix (1 of 2) Never done   COVID-19 Vaccine (3 - Moderna risk series) 01/15/2021    There are no preventive care reminders to display for this patient.   Lab Results  Component Value Date   TSH 2.050 12/22/2020   Lab Results  Component Value Date   WBC 5.8 04/23/2021   HGB 14.5 04/23/2021   HCT 42.5 04/23/2021   MCV 90 04/23/2021   PLT 190 04/23/2021   Lab Results  Component Value Date   NA 141 04/23/2021   K 3.8 04/23/2021   CO2 24 04/23/2021   GLUCOSE 94 04/23/2021   BUN 15 04/23/2021   CREATININE 1.26 04/23/2021   BILITOT 1.0 04/23/2021   ALKPHOS 95 04/23/2021   AST 22 04/23/2021   ALT 21 04/23/2021   PROT 6.8 04/23/2021   ALBUMIN 4.5 04/23/2021   CALCIUM 9.3 04/23/2021   ANIONGAP 11 02/18/2021   EGFR 61 04/23/2021   Lab Results  Component  Value Date   CHOL 134 04/23/2021   Lab Results  Component Value Date   HDL 39 (L) 04/23/2021   Lab Results  Component Value Date   LDLCALC 76 04/23/2021   Lab Results  Component Value Date   TRIG 99 04/23/2021   Lab Results  Component Value Date   CHOLHDL 3.4 04/23/2021   Lab Results  Component Value Date   HGBA1C 5.4 04/23/2021          Assessment & Plan:   Problem List Items Addressed This Visit       Respiratory   Chronic obstructive pulmonary disease (COPD) (HCC)    Sample given.symbicort 2 puffs bid.  Also recommend use albuterol nebs qid x 48 hours than prn.   rx for prednisone sent 08/06/2021 as pt was not improving.  CXR done at that timed did not show pneumonia. Recommend complete all previous rxs given 2 days ago.       Pneumonia due to infectious organism    Ordered rocephin 1 g IM #1 and kenalog 80 mg IM #1. Started Doxycycline 100 mg BID x 10 days and cefdinir 300mg 1 tablet BID x 10 days. Gave him the order for chest xray.       Relevant Medications   cefdinir (OMNICEF) 300 MG capsule   doxycycline (VIBRA-TABS) 100 MG tablet     Other   Subacute cough - Primary    Order for cxr given. Concerned for pneumonia. If not improved in 48 hours, recommended pt get cxr.       Relevant Orders   DG Chest 2 View (Completed)    Meds ordered this encounter  Medications   cefdinir (OMNICEF) 300 MG capsule    Sig: Take 1 capsule (300 mg total) by mouth 2 (two) times daily.    Dispense:  20 capsule    Refill:  0   doxycycline (VIBRA-TABS) 100 MG tablet    Sig: Take 1 tablet (100 mg total) by mouth 2 (two) times daily.    Dispense:  20 tablet    Refill:  0   cefTRIAXone (ROCEPHIN) injection 1 g   triamcinolone acetonide (KENALOG-40) injection 80 mg    Lauren M Auman, rma, and Marla Leal Borjas, CMA, acting as a scribe for  , MD.,have documented all relevant documentation on the behalf of  , MD,as directed by   , MD while in the presence of  , MD.   Follow up prn if not improving.    , MD  

## 2021-08-04 NOTE — Patient Instructions (Addendum)
Given Kenalog shot and Rocephin shot.  Start on symbicort 2 puffs twice a day.  Use albuterol nebulizer 4 times a day for 2 days, and then change to four times a day as needed.  Antibiotics: Doxycycline and cefdinir. Continue mucinex for cough

## 2021-08-06 ENCOUNTER — Other Ambulatory Visit: Payer: Self-pay

## 2021-08-06 DIAGNOSIS — R052 Subacute cough: Secondary | ICD-10-CM | POA: Diagnosis not present

## 2021-08-06 DIAGNOSIS — R059 Cough, unspecified: Secondary | ICD-10-CM | POA: Diagnosis not present

## 2021-08-06 MED ORDER — PREDNISONE 50 MG PO TABS: 50.0000 mg | ORAL_TABLET | Freq: Every day | ORAL | 0 refills | Status: DC

## 2021-08-07 ENCOUNTER — Other Ambulatory Visit: Payer: Self-pay | Admitting: Family Medicine

## 2021-08-08 DIAGNOSIS — J189 Pneumonia, unspecified organism: Secondary | ICD-10-CM | POA: Insufficient documentation

## 2021-08-08 DIAGNOSIS — R052 Subacute cough: Secondary | ICD-10-CM | POA: Insufficient documentation

## 2021-08-08 NOTE — Assessment & Plan Note (Addendum)
Ordered rocephin 1 g IM #1 and kenalog 80 mg IM #1. Started Doxycycline 100 mg BID x 10 days and cefdinir 300mg  1 tablet BID x 10 days. Gave him the order for chest xray.

## 2021-08-10 ENCOUNTER — Other Ambulatory Visit: Payer: Self-pay | Admitting: Cardiology

## 2021-08-10 DIAGNOSIS — I5032 Chronic diastolic (congestive) heart failure: Secondary | ICD-10-CM

## 2021-08-12 ENCOUNTER — Other Ambulatory Visit: Payer: Self-pay

## 2021-08-12 DIAGNOSIS — R052 Subacute cough: Secondary | ICD-10-CM

## 2021-08-14 NOTE — Assessment & Plan Note (Signed)
Order for cxr given. Concerned for pneumonia. If not improved in 48 hours, recommended pt get cxr.

## 2021-08-14 NOTE — Assessment & Plan Note (Signed)
Sample given.symbicort 2 puffs bid.  Also recommend use albuterol nebs qid x 48 hours than prn.   rx for prednisone sent 08/06/2021 as pt was not improving.  CXR done at that timed did not show pneumonia. Recommend complete all previous rxs given 2 days ago.

## 2021-08-26 ENCOUNTER — Other Ambulatory Visit: Payer: Self-pay

## 2021-08-26 ENCOUNTER — Ambulatory Visit (INDEPENDENT_AMBULATORY_CARE_PROVIDER_SITE_OTHER): Payer: Medicare Other

## 2021-08-26 VITALS — BP 138/62 | HR 62 | Ht 70.0 in | Wt 229.0 lb

## 2021-08-26 DIAGNOSIS — Z0000 Encounter for general adult medical examination without abnormal findings: Secondary | ICD-10-CM

## 2021-08-30 NOTE — Progress Notes (Signed)
Subjective:  Patient ID: John Kemp, male    DOB: Feb 20, 1946  Age: 76 y.o. MRN: [id number]  No chief complaint on file.   HPI   Current Outpatient Medications on File Prior to Visit  Medication Sig Dispense Refill   albuterol (PROVENTIL) (2.5 MG/3ML) 0.083% nebulizer solution Take 3 mLs (2.5 mg total) by nebulization every 6 (six) hours as needed for wheezing or shortness of breath. 75 mL 2   amiodarone (PACERONE) 200 MG tablet TAKE 1 TABLET BY MOUTH EVERY DAY 90 tablet 2   amLODipine (NORVASC) 5 MG tablet Take 1 tablet (5 mg total) by mouth daily. 90 tablet 2   atorvastatin (LIPITOR) 10 MG tablet Take 1 tablet (10 mg total) by mouth at bedtime. 90 tablet 1   carvedilol (COREG) 3.125 MG tablet TAKE 1 TABLET (3.125 MG TOTAL) BY MOUTH 2 (TWO) TIMES DAILY WITH A MEAL. 180 tablet 2   cefdinir (OMNICEF) 300 MG capsule Take 1 capsule (300 mg total) by mouth 2 (two) times daily. 20 capsule 0   donepezil (ARICEPT) 10 MG tablet TAKE 1 TABLET BY MOUTH EVERYDAY AT BEDTIME 90 tablet 1   doxycycline (VIBRA-TABS) 100 MG tablet Take 1 tablet (100 mg total) by mouth 2 (two) times daily. 20 tablet 0   ELIQUIS 5 MG TABS tablet TAKE 1 TABLET BY MOUTH TWICE A DAY 180 tablet 1   furosemide (LASIX) 40 MG tablet TAKE 1 TABLET TWICE WEEKLY ON WEDNESDAY AND SATURDAY. 24 tablet 2   Inulin (FIBER CHOICE PO) Take 3 tablets by mouth daily.     levothyroxine (SYNTHROID) 150 MCG tablet Take 1 tablet (150 mcg total) by mouth daily before breakfast. 90 tablet 3   nitroGLYCERIN (NITROSTAT) 0.4 MG SL tablet Place 1 tablet (0.4 mg total) under the tongue every 5 (five) minutes as needed for chest pain. 25 tablet 5   ondansetron (ZOFRAN) 4 MG tablet Take 1 tablet (4 mg total) by mouth every 8 (eight) hours as needed for nausea or vomiting. 30 tablet 0   potassium chloride (K-DUR,KLOR-CON) 10 MEQ tablet Take 10 mEq by mouth See admin instructions. Take 10 mEq by mouth whenever you take furosemide     predniSONE  (DELTASONE) 50 MG tablet Take 1 tablet (50 mg total) by mouth daily with breakfast. 5 tablet 0   tamsulosin (FLOMAX) 0.4 MG CAPS capsule Take 1 capsule (0.4 mg total) by mouth daily. 90 capsule 1   valsartan (DIOVAN) 320 MG tablet TAKE ONE TABLET BY MOUTH DAILY (Patient taking differently: Take 320 mg by mouth daily.) 90 tablet 3   Vitamin D, Ergocalciferol, (DRISDOL) 1.25 MG (50000 UNIT) CAPS capsule TAKE 1 CAPSULE (50,000 UNITS TOTAL) BY MOUTH EVERY SATURDAY. MORNING. 12 capsule 0   zolpidem (AMBIEN) 10 MG tablet Take 1 tablet (10 mg total) by mouth at bedtime as needed for sleep. 90 tablet 1   No current facility-administered medications on file prior to visit.   Past Medical History:  Diagnosis Date   A-fib ([place])    Anticoagulated 01/21/2015   Atherosclerotic heart disease of native coronary artery without angina pectoris    Blepharitis of left lower eyelid 12/02/2019   CAD in native artery 01/21/2015   Cancer ([place])    skin cancer   Chest pain 04/02/2017   CHF (congestive heart failure) ([place]) 11/05/2019   Chronic diastolic (congestive) heart failure (HCC)    Chronic diastolic heart failure ([place]) 01/21/2015   Last Assessment & Plan:  Is congestive heart failure is mildly  decompensated he is edematous he'll increase the dose of his diuretic today the lab work performed including an ARB referred to care transitions for home care he'll follow-up in my office with me in 4-6 weeks daily weights sodium restrictionand compliance of medications beinghemoglobin of will   Chronic obstructive pulmonary disease (COPD) ([place])    Dyspnea 12/31/2014   Followed in Pulmonary clinic/ [facility]/ [facility]  - amiodarone rx 11/2014 >>> - PFTs rec 12/31/2014     Heart attack ([place])    High cholesterol 10/20/2017   Hx of CABG 03/02/2017   Hyperlipidemia    Hypertension    Hypertensive heart disease 01/21/2015   Hypertensive heart disease with heart failure (HCC)    Hypothyroidism    Hypothyroidism (acquired) 04/24/2018    Impaired fasting glucose    Leg fatigue 03/03/2017   Obesity 04/02/2017   On amiodarone therapy 01/21/2015   PAF (paroxysmal atrial fibrillation) ([place])    Primary insomnia    Squamous cell carcinoma 11/05/2019   Stroke (cerebrum) ([place]) 09/2016   right superior quadrantanopsia   Stroke ([place])    Typical atrial flutter ([place]) 01/21/2015   Vitamin D deficiency, unspecified    Past Surgical History:  Procedure Laterality Date   APPENDECTOMY  1956   CARDIAC CATHETERIZATION     CARDIOVERSION  2002   CHOLECYSTECTOMY  2006   CORONARY ARTERY BYPASS GRAFT  2001   HERNIA REPAIR     RADIOLOGY WITH ANESTHESIA Bilateral 01/18/2018   Procedure: MRI WITH ANESTHESIA LUMBAR SPINE WITH AND WITHOUT CONTRAST;  Surgeon: Radiologist, Medication, MD;  Location: [facility];  Service: Radiology;  Laterality: Bilateral;   ROTATOR CUFF REPAIR Left    TONSILLECTOMY  1975    Family History  Problem Relation Age of Onset   Heart disease Father    Arrhythmia Father    Breast cancer Mother    Diabetes type II Sister    Diabetes Brother    Social History   Socioeconomic History   Marital status: Married    Spouse name: Not on file   Number of children: Not on file   Years of education: Not on file   Highest education level: Not on file  Occupational History   Occupation: Retired  Tobacco Use   Smoking status: Former    Packs/day: 1.00    Years: 15.00    Pack years: 15.00    Types: Cigarettes    Quit date: 08/29/1978    Years since quitting: 43.0   Smokeless tobacco: Never  Vaping Use   Vaping Use: Never used  Substance and Sexual Activity   Alcohol use: No    Alcohol/week: 0.0 standard drinks   Drug use: No   Sexual activity: Not Currently  Other Topics Concern   Not on file  Social History Narrative   Not on file   Social Determinants of Health   Financial Resource Strain: High Risk   Difficulty of Paying Living Expenses: Very hard  Food Insecurity: No Food Insecurity   Worried About Paediatric nurse in the Last Year: Never true   Ran Out of Food in the Last Year: Never true  Transportation Needs: Not on file  Physical Activity: Not on file  Stress: Not on file  Social Connections: Not on file    Review of Systems  Constitutional:  Negative for chills, fatigue, fever and unexpected weight change.  HENT:  Negative for congestion, ear pain, sinus pain and sore throat.   Cardiovascular:  Negative for  chest pain and palpitations.  Gastrointestinal:  Negative for abdominal pain, blood in stool, constipation, diarrhea, nausea and vomiting.  Endocrine: Negative for polydipsia.  Genitourinary:  Negative for dysuria.  Musculoskeletal:  Negative for back pain.  Skin:  Negative for rash.  Neurological:  Negative for headaches.    Objective:  There were no vitals taken for this visit.  BP/Weight 08/04/2021 07/26/2021 04/09/7516  Systolic BP 001 749 449  Diastolic BP 68 68 80  Wt. (Lbs) 229 238 238.2  BMI 32.86 34.15 34.18    Physical Exam  Diabetic Foot Exam - Simple   No data filed      Lab Results  Component Value Date   WBC 5.8 04/23/2021   HGB 14.5 04/23/2021   HCT 42.5 04/23/2021   PLT 190 04/23/2021   GLUCOSE 94 04/23/2021   CHOL 134 04/23/2021   TRIG 99 04/23/2021   HDL 39 (L) 04/23/2021   LDLCALC 76 04/23/2021   ALT 21 04/23/2021   AST 22 04/23/2021   NA 141 04/23/2021   K 3.8 04/23/2021   CL 102 04/23/2021   CREATININE 1.26 04/23/2021   BUN 15 04/23/2021   CO2 24 04/23/2021   TSH 2.050 12/22/2020   INR 1.3 (H) 02/18/2021   HGBA1C 5.4 04/23/2021   MICROALBUR Positive 150 01/23/2019      Assessment & Plan:   Problem List Items Addressed This Visit       Cardiovascular and Mediastinum   CAD in native artery   Chronic diastolic heart failure (HCC)   PAF (paroxysmal atrial fibrillation) (HCC) - Primary     Respiratory   Chronic obstructive pulmonary disease (COPD) ([place])     Endocrine   Hypothyroidism (acquired)   Impaired fasting  glucose     Other   Mixed hyperlipidemia   Vitamin D deficiency, unspecified  .  No orders of the defined types were placed in this encounter.   No orders of the defined types were placed in this encounter.    Follow-up: No follow-ups on file.  An After Visit Summary was printed and given to the patient.  [name], MD [facility] [phone]

## 2021-08-31 ENCOUNTER — Encounter: Payer: Self-pay | Admitting: Family Medicine

## 2021-08-31 ENCOUNTER — Other Ambulatory Visit: Payer: Self-pay | Admitting: Family Medicine

## 2021-08-31 ENCOUNTER — Other Ambulatory Visit: Payer: Self-pay

## 2021-08-31 ENCOUNTER — Ambulatory Visit (INDEPENDENT_AMBULATORY_CARE_PROVIDER_SITE_OTHER): Payer: Medicare Other | Admitting: Family Medicine

## 2021-08-31 VITALS — BP 138/60 | HR 72 | Temp 98.6°F | Resp 18 | Wt 228.0 lb

## 2021-08-31 DIAGNOSIS — Z6836 Body mass index (BMI) 36.0-36.9, adult: Secondary | ICD-10-CM

## 2021-08-31 DIAGNOSIS — I2510 Atherosclerotic heart disease of native coronary artery without angina pectoris: Secondary | ICD-10-CM | POA: Diagnosis not present

## 2021-08-31 DIAGNOSIS — E782 Mixed hyperlipidemia: Secondary | ICD-10-CM | POA: Diagnosis not present

## 2021-08-31 DIAGNOSIS — E039 Hypothyroidism, unspecified: Secondary | ICD-10-CM | POA: Diagnosis not present

## 2021-08-31 DIAGNOSIS — J449 Chronic obstructive pulmonary disease, unspecified: Secondary | ICD-10-CM

## 2021-08-31 DIAGNOSIS — D6869 Other thrombophilia: Secondary | ICD-10-CM

## 2021-08-31 DIAGNOSIS — F5101 Primary insomnia: Secondary | ICD-10-CM

## 2021-08-31 DIAGNOSIS — E559 Vitamin D deficiency, unspecified: Secondary | ICD-10-CM

## 2021-08-31 DIAGNOSIS — I25118 Atherosclerotic heart disease of native coronary artery with other forms of angina pectoris: Secondary | ICD-10-CM | POA: Diagnosis not present

## 2021-08-31 DIAGNOSIS — I110 Hypertensive heart disease with heart failure: Secondary | ICD-10-CM

## 2021-08-31 DIAGNOSIS — I5032 Chronic diastolic (congestive) heart failure: Secondary | ICD-10-CM

## 2021-08-31 DIAGNOSIS — R7301 Impaired fasting glucose: Secondary | ICD-10-CM | POA: Diagnosis not present

## 2021-08-31 DIAGNOSIS — I480 Paroxysmal atrial fibrillation: Secondary | ICD-10-CM

## 2021-08-31 NOTE — Progress Notes (Signed)
Subjective:  Patient ID: John Kemp, male    DOB: 1945/12/30  Age: 76 y.o. MRN: [id number]  Chief Complaint  Patient presents with   Hyperlipidemia   Hypothyroidism   Hypertension   HPI: Patient is a 76 year old white male who presents for chronic follow-up.  Patient has hyperlipidemia, hypothyroidism, hypertension with coronary artery disease, atrial fibrillation, prediabetes, vitamin D deficiency and insomnia.  He is doing very well.  He tries to eat healthy, but likes his sweets.  Not exercising.  Atrial fibrillation/HTN/Diastolic CHF: Currently on Eliquis,Amiodarone, amlodipine, valsartan 320 daily and Coreg.  Has nitroglycerin.  Patient has potassium supplement To be taken on the same days he takes his furosemide.  Patient takes his furosemide twice weekly. 8 continue carvedilol and atorvastatin.  10 mEq Hyperlipidemia: Currently on atorvastatin 10 mg nightly. Vitamin D deficiency on 50,000 units weekly. Hypothyroidism currently on Synthroid 150 mcg once daily.  Patient has had his TSH in the last 6 months and was therapeutic.  Primary insomnia: Currently on Ambien 10 mg once daily at night as needed.    Current Outpatient Medications on File Prior to Visit  Medication Sig Dispense Refill   albuterol (PROVENTIL) (2.5 MG/3ML) 0.083% nebulizer solution Take 3 mLs (2.5 mg total) by nebulization every 6 (six) hours as needed for wheezing or shortness of breath. 75 mL 2   amiodarone (PACERONE) 200 MG tablet TAKE 1 TABLET BY MOUTH EVERY DAY 90 tablet 2   amLODipine (NORVASC) 5 MG tablet Take 1 tablet (5 mg total) by mouth daily. 90 tablet 2   atorvastatin (LIPITOR) 10 MG tablet Take 1 tablet (10 mg total) by mouth at bedtime. 90 tablet 1   carvedilol (COREG) 3.125 MG tablet TAKE 1 TABLET (3.125 MG TOTAL) BY MOUTH 2 (TWO) TIMES DAILY WITH A MEAL. 180 tablet 2   donepezil (ARICEPT) 10 MG tablet TAKE 1 TABLET BY MOUTH EVERYDAY AT BEDTIME 90 tablet 1   ELIQUIS 5 MG TABS tablet TAKE 1  TABLET BY MOUTH TWICE A DAY 180 tablet 1   furosemide (LASIX) 40 MG tablet TAKE 1 TABLET TWICE WEEKLY ON WEDNESDAY AND SATURDAY. 24 tablet 2   Inulin (FIBER CHOICE PO) Take 3 tablets by mouth daily.     levothyroxine (SYNTHROID) 150 MCG tablet Take 1 tablet (150 mcg total) by mouth daily before breakfast. 90 tablet 3   nitroGLYCERIN (NITROSTAT) 0.4 MG SL tablet Place 1 tablet (0.4 mg total) under the tongue every 5 (five) minutes as needed for chest pain. 25 tablet 5   ondansetron (ZOFRAN) 4 MG tablet Take 1 tablet (4 mg total) by mouth every 8 (eight) hours as needed for nausea or vomiting. 30 tablet 0   potassium chloride (K-DUR,KLOR-CON) 10 MEQ tablet Take 10 mEq by mouth See admin instructions. Take 10 mEq by mouth whenever you take furosemide     tamsulosin (FLOMAX) 0.4 MG CAPS capsule Take 1 capsule (0.4 mg total) by mouth daily. 90 capsule 1   valsartan (DIOVAN) 320 MG tablet TAKE ONE TABLET BY MOUTH DAILY (Patient taking differently: Take 320 mg by mouth daily.) 90 tablet 3   Vitamin D, Ergocalciferol, (DRISDOL) 1.25 MG (50000 UNIT) CAPS capsule TAKE 1 CAPSULE (50,000 UNITS TOTAL) BY MOUTH EVERY SATURDAY. MORNING. 12 capsule 0   zolpidem (AMBIEN) 10 MG tablet Take 1 tablet (10 mg total) by mouth at bedtime as needed for sleep. 90 tablet 1   No current facility-administered medications on file prior to visit.   Past Medical History:  Diagnosis Date   A-fib [facility])    Anticoagulated 01/21/2015   Atherosclerotic heart disease of native coronary artery without angina pectoris    Blepharitis of left lower eyelid 12/02/2019   CAD in native artery 01/21/2015   Cancer [facility])    skin cancer   Chest pain 04/02/2017   CHF (congestive heart failure) ([place]) 11/05/2019   Chronic diastolic (congestive) heart failure (HCC)    Chronic diastolic heart failure ([place]) 01/21/2015   Last Assessment & Plan:  Is congestive heart failure is mildly decompensated he is edematous he'll increase the dose of his diuretic  today the lab work performed including an ARB referred to care transitions for home care he'll follow-up in my office with me in 4-6 weeks daily weights sodium restrictionand compliance of medications beinghemoglobin of will   Chronic obstructive pulmonary disease (COPD) ([place])    Dyspnea 12/31/2014   Followed in Pulmonary clinic/ [facility]/ [place]  - amiodarone rx 11/2014 >>> - PFTs rec 12/31/2014     Heart attack ([place])    High cholesterol 10/20/2017   Hx of CABG 03/02/2017   Hyperlipidemia    Hypertension    Hypertensive heart disease 01/21/2015   Hypertensive heart disease with heart failure (HCC)    Hypothyroidism    Hypothyroidism (acquired) 04/24/2018   Impaired fasting glucose    Leg fatigue 03/03/2017   Obesity 04/02/2017   On amiodarone therapy 01/21/2015   PAF (paroxysmal atrial fibrillation) ([place])    Primary insomnia    Squamous cell carcinoma 11/05/2019   Stroke (cerebrum) ([place]) 09/2016   right superior quadrantanopsia   Stroke ([place])    Typical atrial flutter ([place]) 01/21/2015   Vitamin D deficiency, unspecified    Past Surgical History:  Procedure Laterality Date   APPENDECTOMY  1956   CARDIAC CATHETERIZATION     CARDIOVERSION  2002   CHOLECYSTECTOMY  2006   CORONARY ARTERY BYPASS GRAFT  2001   HERNIA REPAIR     RADIOLOGY WITH ANESTHESIA Bilateral 01/18/2018   Procedure: MRI WITH ANESTHESIA LUMBAR SPINE WITH AND WITHOUT CONTRAST;  Surgeon: Radiologist, Medication, MD;  Location: [facility];  Service: Radiology;  Laterality: Bilateral;   ROTATOR CUFF REPAIR Left    TONSILLECTOMY  1975    Family History  Problem Relation Age of Onset   Heart disease Father    Arrhythmia Father    Breast cancer Mother    Diabetes type II Sister    Diabetes Brother    Social History   Socioeconomic History   Marital status: Married    Spouse name: Not on file   Number of children: Not on file   Years of education: Not on file   Highest education level: Not on file  Occupational History    Occupation: Retired  Tobacco Use   Smoking status: Former    Packs/day: 1.00    Years: 15.00    Pack years: 15.00    Types: Cigarettes    Quit date: 08/29/1978    Years since quitting: 43.0   Smokeless tobacco: Never  Vaping Use   Vaping Use: Never used  Substance and Sexual Activity   Alcohol use: No    Alcohol/week: 0.0 standard drinks   Drug use: No   Sexual activity: Not Currently  Other Topics Concern   Not on file  Social History Narrative   Not on file   Social Determinants of Health   Financial Resource Strain: High Risk   Difficulty of Paying Living Expenses: Very hard  Food Insecurity: No Food Insecurity   Worried About Charity fundraiser in the Last Year: Never true   Ran Out of Food in the Last Year: Never true  Transportation Needs: Not on file  Physical Activity: Not on file  Stress: Not on file  Social Connections: Not on file    Review of Systems  Constitutional:  Negative for chills and fever.  HENT:  Negative for congestion, rhinorrhea and sore throat.   Respiratory:  Negative for cough and shortness of breath.   Cardiovascular:  Negative for chest pain and palpitations.  Gastrointestinal:  Negative for abdominal pain, constipation, diarrhea, nausea and vomiting.  Endocrine: Positive for polyuria (flomax has helped . also helped with nocturia). Negative for polydipsia and polyphagia.  Genitourinary:  Negative for dysuria and urgency.  Musculoskeletal:  Negative for arthralgias, back pain and myalgias.  Neurological:  Positive for light-headedness. Negative for dizziness and headaches.  Psychiatric/Behavioral:  Negative for dysphoric mood. The patient is not nervous/anxious.     Objective:  BP 138/60    Pulse 72    Temp 98.6 F (37 C)    Resp 18    Wt 228 lb (103.4 kg)    SpO2 97%    BMI 32.71 kg/m   BP/Weight 08/31/2021 08/04/2021 73/71/0626  Systolic BP 948 546 270  Diastolic BP 60 68 68  Wt. (Lbs) 228 229 238  BMI 32.71 32.86 34.15     Physical Exam Vitals reviewed.  Constitutional:      Appearance: Normal appearance.  Neck:     Vascular: No carotid bruit.  Cardiovascular:     Rate and Rhythm: Normal rate. Rhythm irregular.     Pulses: Normal pulses.     Heart sounds: Normal heart sounds.  Pulmonary:     Effort: Pulmonary effort is normal.     Breath sounds: Normal breath sounds. No wheezing, rhonchi or rales.  Abdominal:     General: Bowel sounds are normal.     Palpations: Abdomen is soft.     Tenderness: There is no abdominal tenderness.  Neurological:     Mental Status: He is alert.  Psychiatric:        Mood and Affect: Mood normal.        Behavior: Behavior normal.    Diabetic Foot Exam - Simple   No data filed      Lab Results  Component Value Date   WBC 5.8 04/23/2021   HGB 14.5 04/23/2021   HCT 42.5 04/23/2021   PLT 190 04/23/2021   GLUCOSE 94 04/23/2021   CHOL 134 04/23/2021   TRIG 99 04/23/2021   HDL 39 (L) 04/23/2021   LDLCALC 76 04/23/2021   ALT 21 04/23/2021   AST 22 04/23/2021   NA 141 04/23/2021   K 3.8 04/23/2021   CL 102 04/23/2021   CREATININE 1.26 04/23/2021   BUN 15 04/23/2021   CO2 24 04/23/2021   TSH 2.050 12/22/2020   INR 1.3 (H) 02/18/2021   HGBA1C 5.4 04/23/2021   MICROALBUR Positive 150 01/23/2019      Assessment & Plan:   Problem List Items Addressed This Visit       Cardiovascular and Mediastinum   CAD in native artery    Patient has nitroglycerin on hand. Continue carvedilol and atorvastatin        Chronic diastolic heart failure (HCC)    Continue Lasix and potassium as prescribed by cardiology. Continue valsartan. Continue carvedilol.      PAF (paroxysmal atrial  fibrillation) ([place]) - Primary    Continue eliquis, amiodarone.  Management per specialist.        Hypertensive heart disease with heart failure ([place])    At goal. Continue current medications. Recommend continue to work on eating healthy diet and exercise.          Respiratory   Chronic obstructive pulmonary disease (COPD) ([place])    COPD exacerbation resolved with use of prednisone.  Patient doing well.  Generally does not need an inhaler other than albuterol as needed.        Endocrine   Hypothyroidism (acquired)    Continue current Synthroid dose. Therapeutic in the last 6 months.      Impaired fasting glucose    Well-controlled.  Last A1c was 5.3.        Hematopoietic and Hemostatic   Acquired thrombophilia ([place])    Secondary to Eliquis.        Other   Severe obesity with body mass index (BMI) of 36.0 to 36.9 with serious comorbidity ([place])    Recommend continue to work on eating healthy diet and exercise.       Mixed hyperlipidemia    Well-controlled.  Continue Lipitor. Plan to check labs in 6 months.      Vitamin D deficiency, unspecified    Continue vitamin D 50 K weekly.      Primary insomnia    Continue ambien.      Other Visit Diagnoses     Coronary artery disease of native artery of native heart with stable angina pectoris (HCC)   (Chronic)       .   Follow-up: Return in about 6 months (around 02/28/2022) for chronic fasting.  An After Visit Summary was printed and given to the patient.  [name], MD [facility] [phone]

## 2021-08-31 NOTE — Assessment & Plan Note (Signed)
>>  ASSESSMENT AND PLAN FOR PAF (PAROXYSMAL ATRIAL FIBRILLATION) ([place]) WRITTEN ON 08/31/2021  9:26 AM BY COX, KIRSTEN, MD  Continue eliquis, amiodarone.  Management per specialist.

## 2021-08-31 NOTE — Assessment & Plan Note (Signed)
Well-controlled.  Last A1c was 5.3.

## 2021-08-31 NOTE — Assessment & Plan Note (Signed)
Recommend continue to work on eating healthy diet and exercise.  

## 2021-08-31 NOTE — Assessment & Plan Note (Signed)
Continue eliquis, amiodarone.  Management per specialist.

## 2021-08-31 NOTE — Assessment & Plan Note (Signed)
Continue Lasix and potassium as prescribed by cardiology. Continue valsartan. Continue carvedilol.

## 2021-08-31 NOTE — Assessment & Plan Note (Signed)
Patient has nitroglycerin on hand. Continue carvedilol and atorvastatin

## 2021-08-31 NOTE — Assessment & Plan Note (Signed)
COPD exacerbation resolved with use of prednisone.  Patient doing well.  Generally does not need an inhaler other than albuterol as needed.

## 2021-08-31 NOTE — Assessment & Plan Note (Signed)
Continue current Synthroid dose. Therapeutic in the last 6 months.

## 2021-08-31 NOTE — Assessment & Plan Note (Signed)
Continue ambien

## 2021-08-31 NOTE — Assessment & Plan Note (Signed)
Continue vitamin D 50K weekly. 

## 2021-08-31 NOTE — Assessment & Plan Note (Signed)
Secondary to Eliquis.

## 2021-08-31 NOTE — Assessment & Plan Note (Signed)
Well-controlled.  Continue Lipitor. Plan to check labs in 6 months.

## 2021-08-31 NOTE — Assessment & Plan Note (Signed)
>>  ASSESSMENT AND PLAN FOR CAD IN NATIVE ARTERY WRITTEN ON 08/31/2021  9:32 AM BY COX, KIRSTEN, MD  Patient has nitroglycerin on hand. Continue carvedilol and atorvastatin

## 2021-08-31 NOTE — Assessment & Plan Note (Signed)
>>  ASSESSMENT AND PLAN FOR CHRONIC DIASTOLIC HEART FAILURE ([place]) WRITTEN ON 08/31/2021  9:31 AM BY COX, KIRSTEN, MD  Continue Lasix and potassium as prescribed by cardiology. Continue valsartan. Continue carvedilol.

## 2021-08-31 NOTE — Assessment & Plan Note (Signed)
At goal. Continue current medications. Recommend continue to work on eating healthy diet and exercise.

## 2021-09-02 ENCOUNTER — Telehealth: Payer: Self-pay

## 2021-09-02 NOTE — Chronic Care Management (AMB) (Signed)
Chronic Care Management Pharmacy Assistant   Name: John Kemp  MRN: [id number] DOB: February 10, 1946  Reason for Encounter: Disease State/ Hypertension  Recent office visits:  07-26-2021 [name], MD. START Zofran 4 mg every 8 hours as needed. Positive flu test.  08-04-2021 [name], MD. Given Kenalog and Rocephin injection. DG chest ordered. START Omnicef 300 mg twice daily for 10 days and Doxycycline 100 mg twice daily for 10 days.  08-26-2021 Erie Noe, LPN. Annual wellness visit.  08-31-2021 [name], MD. Courses completed for Omnicef, Doxycycline and Prednisone. Follow up in 6 months.  Recent consult visits:  None  Hospital visits:  None in previous 6 months  Medications: Outpatient Encounter Medications as of 09/02/2021  Medication Sig   albuterol (PROVENTIL) (2.5 MG/3ML) 0.083% nebulizer solution Take 3 mLs (2.5 mg total) by nebulization every 6 (six) hours as needed for wheezing or shortness of breath.   amiodarone (PACERONE) 200 MG tablet TAKE 1 TABLET BY MOUTH EVERY DAY   amLODipine (NORVASC) 5 MG tablet Take 1 tablet (5 mg total) by mouth daily.   atorvastatin (LIPITOR) 10 MG tablet Take 1 tablet (10 mg total) by mouth at bedtime.   carvedilol (COREG) 3.125 MG tablet TAKE 1 TABLET (3.125 MG TOTAL) BY MOUTH 2 (TWO) TIMES DAILY WITH A MEAL.   donepezil (ARICEPT) 10 MG tablet TAKE 1 TABLET BY MOUTH EVERYDAY AT BEDTIME   ELIQUIS 5 MG TABS tablet TAKE 1 TABLET BY MOUTH TWICE A DAY   furosemide (LASIX) 40 MG tablet TAKE 1 TABLET TWICE WEEKLY ON WEDNESDAY AND SATURDAY.   Inulin (FIBER CHOICE PO) Take 3 tablets by mouth daily.   levothyroxine (SYNTHROID) 150 MCG tablet Take 1 tablet (150 mcg total) by mouth daily before breakfast.   nitroGLYCERIN (NITROSTAT) 0.4 MG SL tablet Place 1 tablet (0.4 mg total) under the tongue every 5 (five) minutes as needed for chest pain.   ondansetron (ZOFRAN) 4 MG tablet Take 1 tablet (4 mg total) by mouth every 8 (eight)  hours as needed for nausea or vomiting.   potassium chloride (K-DUR,KLOR-CON) 10 MEQ tablet Take 10 mEq by mouth See admin instructions. Take 10 mEq by mouth whenever you take furosemide   tamsulosin (FLOMAX) 0.4 MG CAPS capsule Take 1 capsule (0.4 mg total) by mouth daily.   valsartan (DIOVAN) 320 MG tablet TAKE ONE TABLET BY MOUTH DAILY (Patient taking differently: Take 320 mg by mouth daily.)   Vitamin D, Ergocalciferol, (DRISDOL) 1.25 MG (50000 UNIT) CAPS capsule TAKE 1 CAPSULE (50,000 UNITS TOTAL) BY MOUTH EVERY SATURDAY. MORNING.   zolpidem (AMBIEN) 10 MG tablet Take 1 tablet (10 mg total) by mouth at bedtime as needed for sleep.   No facility-administered encounter medications on file as of 09/02/2021.   Recent Office Vitals: BP Readings from Last 3 Encounters:  08/31/21 138/60  08/04/21 138/68  07/26/21 140/68   Pulse Readings from Last 3 Encounters:  08/31/21 72  08/04/21 (!) 52  07/26/21 (!) 59    Wt Readings from Last 3 Encounters:  08/31/21 228 lb (103.4 kg)  08/04/21 229 lb (103.9 kg)  07/26/21 238 lb (108 kg)     Kidney Function Lab Results  Component Value Date/Time   CREATININE 1.26 04/23/2021 09:53 AM   CREATININE 1.18 02/18/2021 08:11 AM   GFRNONAA >60 02/18/2021 08:11 AM   GFRAA 54 (L) 09/21/2020 10:05 AM    BMP Latest Ref Rng & Units 04/23/2021 02/18/2021 12/22/2020  Glucose 65 - 99 mg/dL 94  97 94  BUN 8 - 27 mg/dL 15 8 12   Creatinine 0.76 - 1.27 mg/dL 1.26 1.18 1.30(H)  BUN/Creat Ratio 10 - 24 12 - 9(L)  Sodium 134 - 144 mmol/L 141 135 140  Potassium 3.5 - 5.2 mmol/L 3.8 3.9 4.2  Chloride 96 - 106 mmol/L 102 99 100  CO2 20 - 29 mmol/L 24 25 26   Calcium 8.6 - 10.2 mg/dL 9.3 9.2 9.4     Current antihypertensive regimen:  Amlodipine 5 mg bid Carvedilol 3.125 mg bid with a meal Furosemide 40 mg twice weekly Valsartan 320 mg daily Potassium 10 meq daily prn   Patient verbally confirms he is taking the above medications as directed. Yes  How often are  you checking your Blood Pressure? weekly. Patient states he hasn't checked blood pressure at home this week since he saw Dr. Tobie Poet on 08-31-2021.  he checks his blood pressure in the middle of the day after taking his medication.  Current home BP readings: 08-31-2020 office visit 138/60, pulse 72 Wrist or arm cuff: Both Caffeine intake: Drinks half a can of diet pepsi weekly now Salt intake: doesn't use salt OTC medications including pseudoephedrine or NSAIDs?None  Any readings above 180/120? No  What recent interventions/DTPs have been made by any provider to improve Blood Pressure control since last CPP Visit:  Educated on BP goals and benefits of medications for prevention of heart attack, stroke and kidney damage; Daily salt intake goal < 2300 mg; Exercise goal of 150 minutes per week; Importance of home blood pressure monitoring; -Counseled to monitor BP at home weekly, document, and provide log at future appointments -Counseled on diet and exercise extensively  Any recent hospitalizations or ED visits since last visit with CPP? No  What diet changes have been made to improve Blood Pressure Control?  Patient states he has limited salt intake and drinks plenty of water daily.  What exercise is being done to improve your Blood Pressure Control?  Patient states he doesn't move around much but does walk around the house and sits in his recliner mainly.  Adherence Review: Is the patient currently on ACE/ARB medication? Yes Does the patient have >5 day gap between last estimated fill dates? CPP to review  Care Gaps: Last annual wellness visit? 08-26-2021 Hep C screening overdue Shingrix overdue 3rd Moderna vaccine overdue last completed 12-18-2020  Star Rating Drugs: Atorvastatin 10 mg- Last filled 07-16-2021 90 DS Valsartan 320 mg- Last filled 08-31-2021 90 DS  [place] Clinical Pharmacist Assistant [phone]

## 2021-09-07 NOTE — Patient Instructions (Signed)

## 2021-09-07 NOTE — Progress Notes (Signed)
Subjective:   John Kemp is a 76 y.o. male who presents for Medicare Annual/Subsequent preventive examination.  This wellness visit is conducted by a nurse.  The patient's medications were reviewed and reconciled since the patient's last visit.  History details were provided by the patient.  The history appears to be reliable.    Patient's last AWV was one year ago.   Medical History: Patient history and Family history was reviewed  Medications, Allergies, and preventative health maintenance was reviewed and updated.   Review of Systems    ROS-Negative Cardiac Risk Factors include: advanced age (>27men, >59 women);diabetes mellitus;dyslipidemia;male gender;obesity (BMI >30kg/m2)     Objective:    Today's Vitals   08/26/21 1500  BP: 138/62  Pulse: 62  SpO2: 97%  Weight: 229 lb (103.9 kg)  Height: 5\' 10"  (1.778 m)  PainSc: 0-No pain   Body mass index is 32.86 kg/m.  Advanced Directives 09/07/2021 02/18/2021 07/21/2020 01/18/2018 04/02/2017  Does Patient Have a Medical Advance Directive? No Yes Yes Yes Yes  Type of Advance Directive - [place];Living will [place];Living will Living will;Healthcare Power of Attorney Living will  Does patient want to make changes to medical advance directive? - No - Patient declined - No - Patient declined No - Patient declined  Copy of [place] in Chart? - No - copy requested - No - copy requested -    Current Medications (verified) Outpatient Encounter Medications as of 08/26/2021  Medication Sig   albuterol (PROVENTIL) (2.5 MG/3ML) 0.083% nebulizer solution Take 3 mLs (2.5 mg total) by nebulization every 6 (six) hours as needed for wheezing or shortness of breath.   amiodarone (PACERONE) 200 MG tablet TAKE 1 TABLET BY MOUTH EVERY DAY   amLODipine (NORVASC) 5 MG tablet Take 1 tablet (5 mg total) by mouth daily.   carvedilol (COREG) 3.125 MG tablet TAKE 1 TABLET (3.125 MG TOTAL) BY  MOUTH 2 (TWO) TIMES DAILY WITH A MEAL.   donepezil (ARICEPT) 10 MG tablet TAKE 1 TABLET BY MOUTH EVERYDAY AT BEDTIME   ELIQUIS 5 MG TABS tablet TAKE 1 TABLET BY MOUTH TWICE A DAY   furosemide (LASIX) 40 MG tablet TAKE 1 TABLET TWICE WEEKLY ON WEDNESDAY AND SATURDAY.   Inulin (FIBER CHOICE PO) Take 3 tablets by mouth daily.   levothyroxine (SYNTHROID) 150 MCG tablet Take 1 tablet (150 mcg total) by mouth daily before breakfast.   nitroGLYCERIN (NITROSTAT) 0.4 MG SL tablet Place 1 tablet (0.4 mg total) under the tongue every 5 (five) minutes as needed for chest pain.   ondansetron (ZOFRAN) 4 MG tablet Take 1 tablet (4 mg total) by mouth every 8 (eight) hours as needed for nausea or vomiting.   potassium chloride (K-DUR,KLOR-CON) 10 MEQ tablet Take 10 mEq by mouth See admin instructions. Take 10 mEq by mouth whenever you take furosemide   valsartan (DIOVAN) 320 MG tablet TAKE ONE TABLET BY MOUTH DAILY (Patient taking differently: Take 320 mg by mouth daily.)   Vitamin D, Ergocalciferol, (DRISDOL) 1.25 MG (50000 UNIT) CAPS capsule TAKE 1 CAPSULE (50,000 UNITS TOTAL) BY MOUTH EVERY SATURDAY. MORNING.   zolpidem (AMBIEN) 10 MG tablet Take 1 tablet (10 mg total) by mouth at bedtime as needed for sleep.   [DISCONTINUED] atorvastatin (LIPITOR) 10 MG tablet Take 1 tablet (10 mg total) by mouth at bedtime.   [DISCONTINUED] cefdinir (OMNICEF) 300 MG capsule Take 1 capsule (300 mg total) by mouth 2 (two) times daily.   [  DISCONTINUED] doxycycline (VIBRA-TABS) 100 MG tablet Take 1 tablet (100 mg total) by mouth 2 (two) times daily.   [DISCONTINUED] predniSONE (DELTASONE) 50 MG tablet Take 1 tablet (50 mg total) by mouth daily with breakfast.   [DISCONTINUED] tamsulosin (FLOMAX) 0.4 MG CAPS capsule Take 1 capsule (0.4 mg total) by mouth daily.   No facility-administered encounter medications on file as of 08/26/2021.    Allergies (verified) Avelox [moxifloxacin hcl], Cartia xt [diltiazem hcl], Pravachol  [pravastatin sodium], Prednisone, and Zetia [ezetimibe]   History: Past Medical History:  Diagnosis Date   A-fib ([place])    Anticoagulated 01/21/2015   Atherosclerotic heart disease of native coronary artery without angina pectoris    Blepharitis of left lower eyelid 12/02/2019   CAD in native artery 01/21/2015   Cancer ([place])    skin cancer   Chest pain 04/02/2017   CHF (congestive heart failure) ([place]) 11/05/2019   Chronic diastolic (congestive) heart failure (H[facility])    Chronic diastolic heart failure ([place]) 01/21/2015   Last Assessment & Plan:  Is congestive heart failure is mildly decompensated he is edematous he'll increase the dose of his diuretic today the lab work performed including an ARB referred to care transitions for home care he'll follow-up in my office with me in 4-6 weeks daily weights sodium restrictionand compliance of medications beinghemoglobin of will   Chronic obstructive pulmonary disease (COPD) ([place])    Dyspnea 12/31/2014   Followed in Pulmonary clinic/ [facility]/ [place]  - amiodarone rx 11/2014 >>> - PFTs rec 12/31/2014     Heart attack ([place])    High cholesterol 10/20/2017   Hx of CABG 03/02/2017   Hyperlipidemia    Hypertension    Hypertensive heart disease 01/21/2015   Hypertensive heart disease with heart failure ([place])    Hypothyroidism    Hypothyroidism (acquired) 04/24/2018   Impaired fasting glucose    Leg fatigue 03/03/2017   Obesity 04/02/2017   On amiodarone therapy 01/21/2015   PAF (paroxysmal atrial fibrillation) ([place])    Primary insomnia    Squamous cell carcinoma 11/05/2019   Stroke (cerebrum) ([place]) 09/2016   right superior quadrantanopsia   Stroke ([place])    Typical atrial flutter ([place]) 01/21/2015   Vitamin D deficiency, unspecified    Past Surgical History:  Procedure Laterality Date   APPENDECTOMY  1956   CARDIAC CATHETERIZATION     CARDIOVERSION  2002   CHOLECYSTECTOMY  2006   CORONARY ARTERY BYPASS GRAFT  2001   HERNIA REPAIR     RADIOLOGY WITH  ANESTHESIA Bilateral 01/18/2018   Procedure: MRI WITH ANESTHESIA LUMBAR SPINE WITH AND WITHOUT CONTRAST;  Surgeon: Radiologist, Medication, MD;  Location: [facility];  Service: Radiology;  Laterality: Bilateral;   ROTATOR CUFF REPAIR Left    TONSILLECTOMY  1975   Family History  Problem Relation Age of Onset   Heart disease Father    Arrhythmia Father    Breast cancer Mother    Diabetes type II Sister    Diabetes Brother    Social History   Socioeconomic History   Marital status: Married    Spouse name: Not on file   Number of children: Not on file   Years of education: Not on file   Highest education level: Not on file  Occupational History   Occupation: Retired  Tobacco Use   Smoking status: Former    Packs/day: 1.00    Years: 15.00    Pack years: 15.00    Types: Cigarettes    Quit date:  08/29/1978    Years since quitting: 43.0   Smokeless tobacco: Never  Vaping Use   Vaping Use: Never used  Substance and Sexual Activity   Alcohol use: No    Alcohol/week: 0.0 standard drinks   Drug use: No   Sexual activity: Not Currently  Other Topics Concern   Not on file  Social History Narrative   Not on file   Social Determinants of Health   Financial Resource Strain: High Risk   Difficulty of Paying Living Expenses: Very hard  Food Insecurity: No Food Insecurity   Worried About Charity fundraiser in the Last Year: Never true   Ran Out of Food in the Last Year: Never true  Transportation Needs: Not on file  Physical Activity: Not on file  Stress: Not on file  Social Connections: Not on file    Tobacco Counseling Counseling given: Not Answered   Clinical Intake:  Pre-visit preparation completed: Yes  Pain : No/denies pain Pain Score: 0-No pain     BMI - recorded: 32.86 Nutritional Status: BMI > 30  Obese Nutritional Risks: None Diabetes: No (A1C 5.4)  How often do you need to have someone help you when you read instructions, pamphlets, or other written  materials from your doctor or pharmacy?: 2 - Rarely Interpreter Needed?: No   Activities of Daily Living In your present state of health, do you have any difficulty performing the following activities: 08/26/2021  Hearing? Y  Vision? N  Difficulty concentrating or making decisions? N  Walking or climbing stairs? N  Dressing or bathing? N  Doing errands, shopping? N  Preparing Food and eating ? N  Using the Toilet? N  In the past six months, have you accidently leaked urine? N  Do you have problems with loss of bowel control? N  Managing your Medications? N  Managing your Finances? N  Housekeeping or managing your Housekeeping? N  Some recent data might be hidden    Patient Care Team: [name], MD as PCP - General (Family Medicine) Burnice Logan, [facility] (Inactive) as Pharmacist (Pharmacist) Lane Hacker, [facility] as Pharmacist (Pharmacist)  Indicate any recent Medical Services you may have received from other than [facility] providers in the past year (date may be approximate).     Assessment:   This is a routine wellness examination for [place].  Hearing/Vision screen No results found.  Dietary issues and exercise activities discussed: Current Exercise Habits: The patient does not participate in regular exercise at present, Exercise limited by: None identified   Goals Addressed   None    Depression Screen PHQ 2/9 Scores 08/31/2021 08/26/2021 04/23/2021 04/05/2021 12/22/2020 09/21/2020 07/21/2020  PHQ - 2 Score 0 0 0 0 0 0 0    Fall Risk Fall Risk  08/31/2021 08/26/2021 04/23/2021 04/05/2021 12/22/2020  Falls in the past year? 0 0 0 0 0  Comment - - - - -  Number falls in past yr: 0 0 0 0 0  Injury with Fall? 0 0 0 0 0  Risk for fall due to : - No Fall Risks - Impaired balance/gait -  Risk for fall due to: Comment - - - Wife states that patient stumbles at times and also shuffles his feet. -  Follow up Falls evaluation completed Falls evaluation completed;Falls prevention discussed  - Falls evaluation completed -    FALL RISK PREVENTION PERTAINING TO THE HOME:  Home free of loose throw rugs in walkways, pet beds, electrical cords, etc? Yes  Adequate lighting in your home to reduce risk of falls? Yes   ASSISTIVE DEVICES UTILIZED TO PREVENT FALLS:  Life alert? No  Use of a cane, walker or w/c? No  Grab bars in the bathroom? No  Shower chair or bench in shower? No  Elevated toilet seat or a handicapped toilet? No    Gait steady and fast without use of assistive device  Cognitive Function: MMSE - Mini Mental State Exam 04/23/2021 09/25/2018 06/20/2018  Not completed: - (No Data) -  Orientation to time 4 5 3   Orientation to Place 5 5 5   Registration 3 3 3   Attention/ Calculation 2 5 3   Recall 3 3 2   Language- name 2 objects 2 2 2   Language- repeat 1 1 1   Language- follow 3 step command 3 3 3   Language- read & follow direction 1 1 1   Write a sentence 1 1 1   Copy design 1 1 1   Total score 26 30 25         Immunizations Immunization History  Administered Date(s) Administered   Fluad Quad(high Dose 65+) 05/22/2020, 04/23/2021   Influenza Split 05/29/2014   Influenza-Unspecified 06/29/2018, 06/04/2019   Moderna SARS-COV2 Booster Vaccination 06/30/2020, 12/18/2020   Moderna Sars-Covid-2 Vaccination 10/25/2019, 11/27/2019   Pneumococcal Conjugate-13 07/12/2017   Pneumococcal Polysaccharide-23 07/19/2018   Tdap 04/30/2014    TDAP status: Up to date  Flu Vaccine status: Up to date  Pneumococcal vaccine status: Up to date  Covid-19 vaccine status: Information provided on how to obtain vaccines.   Qualifies for Shingles Vaccine? Yes   Zostavax completed No   Shingrix Completed?: No.    Education has been provided regarding the importance of this vaccine. Patient has been advised to call insurance company to determine out of pocket expense if they have not yet received this vaccine. Advised may also receive vaccine at local pharmacy or Health Dept.  Verbalized acceptance and understanding.  Screening Tests Health Maintenance  Topic Date Due   Hepatitis C Screening  Never done   Zoster Vaccines- Shingrix (1 of 2) Never done   COVID-19 Vaccine (3 - Moderna risk series) 01/15/2021   TETANUS/TDAP  04/30/2024   COLONOSCOPY (Pts 45-19yrs Insurance coverage will need to be confirmed)  11/05/2028   Pneumonia Vaccine 9+ Years old  Completed   INFLUENZA VACCINE  Completed   HPV VACCINES  Aged Out    Health Maintenance  Health Maintenance Due  Topic Date Due   Hepatitis C Screening  Never done   Zoster Vaccines- Shingrix (1 of 2) Never done   COVID-19 Vaccine (3 - Moderna risk series) 01/15/2021    Colorectal cancer screening: Type of screening: Colonoscopy. Completed 10/2018. Repeat every 10 years  Lung Cancer Screening: (Low Dose CT Chest recommended if Age 18-80 years, 30 pack-year currently smoking OR have quit w/in 15years.) does not qualify.   Additional Screening:  Vision Screening: Recommended annual ophthalmology exams for early detection of glaucoma and other disorders of the eye. Is the patient up to date with their annual eye exam?  No   Dental Screening: Recommended annual dental exams for proper oral hygiene     Plan:     I have personally reviewed and noted the following in the patients chart:   Medical and social history Use of alcohol, tobacco or illicit drugs  Current medications and supplements including opioid prescriptions.  Functional ability and status Nutritional status Physical activity Advanced directives List of other physicians Hospitalizations, surgeries, and ER visits in  previous 12 months Vitals Screenings to include cognitive, depression, and falls Referrals and appointments  In addition, I have reviewed and discussed with patient certain preventive protocols, quality metrics, and best practice recommendations. A written personalized care plan for preventive services as well as general  preventive health recommendations were provided to patient.     Erie Noe, LPN   [id number]

## 2021-09-29 ENCOUNTER — Other Ambulatory Visit: Payer: Self-pay | Admitting: Family Medicine

## 2021-09-30 NOTE — Telephone Encounter (Signed)
Patient calling for update. He is worried about running out. "It has got me. I ran out last time and it causes a bad night."   John Kemp, [place] 09/30/21 10:04 AM

## 2021-10-14 ENCOUNTER — Telehealth: Payer: Self-pay

## 2021-10-14 NOTE — Chronic Care Management (AMB) (Signed)
Chronic Care Management Pharmacy Assistant   Name: John Kemp  MRN: [id number] DOB: Jun 08, 1946  Reason for Encounter: Disease State/ Hypertension  Recent office visits:  None  Recent consult visits:  None  Hospital visits:  None in previous 6 months  Medications: Outpatient Encounter Medications as of 10/14/2021  Medication Sig   albuterol (PROVENTIL) (2.5 MG/3ML) 0.083% nebulizer solution Take 3 mLs (2.5 mg total) by nebulization every 6 (six) hours as needed for wheezing or shortness of breath.   amiodarone (PACERONE) 200 MG tablet TAKE 1 TABLET BY MOUTH EVERY DAY   amLODipine (NORVASC) 5 MG tablet Take 1 tablet (5 mg total) by mouth daily.   atorvastatin (LIPITOR) 10 MG tablet TAKE 1 TABLET BY MOUTH EVERYDAY AT BEDTIME   carvedilol (COREG) 3.125 MG tablet TAKE 1 TABLET (3.125 MG TOTAL) BY MOUTH 2 (TWO) TIMES DAILY WITH A MEAL.   donepezil (ARICEPT) 10 MG tablet TAKE 1 TABLET BY MOUTH EVERYDAY AT BEDTIME   ELIQUIS 5 MG TABS tablet TAKE 1 TABLET BY MOUTH TWICE A DAY   furosemide (LASIX) 40 MG tablet TAKE 1 TABLET TWICE WEEKLY ON WEDNESDAY AND SATURDAY.   Inulin (FIBER CHOICE PO) Take 3 tablets by mouth daily.   levothyroxine (SYNTHROID) 150 MCG tablet Take 1 tablet (150 mcg total) by mouth daily before breakfast.   nitroGLYCERIN (NITROSTAT) 0.4 MG SL tablet Place 1 tablet (0.4 mg total) under the tongue every 5 (five) minutes as needed for chest pain.   ondansetron (ZOFRAN) 4 MG tablet Take 1 tablet (4 mg total) by mouth every 8 (eight) hours as needed for nausea or vomiting.   potassium chloride (K-DUR,KLOR-CON) 10 MEQ tablet Take 10 mEq by mouth See admin instructions. Take 10 mEq by mouth whenever you take furosemide   tamsulosin (FLOMAX) 0.4 MG CAPS capsule TAKE 1 CAPSULE BY MOUTH EVERY DAY   valsartan (DIOVAN) 320 MG tablet TAKE ONE TABLET BY MOUTH DAILY (Patient taking differently: Take 320 mg by mouth daily.)   Vitamin D, Ergocalciferol, (DRISDOL) 1.25 MG (50000  UNIT) CAPS capsule TAKE 1 CAPSULE (50,000 UNITS TOTAL) BY MOUTH EVERY SATURDAY. MORNING.   zolpidem (AMBIEN) 10 MG tablet Take 1 tablet (10 mg total) by mouth at bedtime as needed for sleep.   No facility-administered encounter medications on file as of 10/14/2021.   Recent Office Vitals: BP Readings from Last 3 Encounters:  08/31/21 138/60  08/26/21 138/62  08/04/21 138/68   Pulse Readings from Last 3 Encounters:  08/31/21 72  08/26/21 62  08/04/21 (!) 52    Wt Readings from Last 3 Encounters:  08/31/21 228 lb (103.4 kg)  08/26/21 229 lb (103.9 kg)  08/04/21 229 lb (103.9 kg)     Kidney Function Lab Results  Component Value Date/Time   CREATININE 1.26 04/23/2021 09:53 AM   CREATININE 1.18 02/18/2021 08:11 AM   GFRNONAA >60 02/18/2021 08:11 AM   GFRAA 54 (L) 09/21/2020 10:05 AM    BMP Latest Ref Rng & Units 04/23/2021 02/18/2021 12/22/2020  Glucose 65 - 99 mg/dL 94 97 94  BUN 8 - 27 mg/dL 15 8 12   Creatinine 0.76 - 1.27 mg/dL 1.26 1.18 1.30(H)  BUN/Creat Ratio 10 - 24 12 - 9(L)  Sodium 134 - 144 mmol/L 141 135 140  Potassium 3.5 - 5.2 mmol/L 3.8 3.9 4.2  Chloride 96 - 106 mmol/L 102 99 100  CO2 20 - 29 mmol/L 24 25 26   Calcium 8.6 - 10.2 mg/dL 9.3 9.2 9.4  Current antihypertensive regimen:  Amlodipine 5 mg bid Carvedilol 3.125 mg bid with a meal Furosemide 40 mg twice weekly Valsartan 320 mg daily Potassium 10 meq daily prn   Patient verbally confirms he is taking the above medications as directed. Yes  How often are you checking your Blood Pressure? 1-2x per week  he checks his blood pressure in the middle of the day after taking his medication.  Current home BP readings: 136/72 Wrist or arm cuff: Both Caffeine intake: Drinks half a can of diet pepsi weekly now Salt intake: Limited OTC medications including pseudoephedrine or NSAIDs? None  Any readings above 180/120? No  What recent interventions/DTPs have been made by any provider to improve Blood  Pressure control since last CPP Visit:  Educated on BP goals and benefits of medications for prevention of heart attack, stroke and kidney damage; Daily salt intake goal < 2300 mg; Exercise goal of 150 minutes per week; Importance of home blood pressure monitoring; -Counseled to monitor BP at home weekly, document, and provide log at future appointments -Counseled on diet and exercise extensively  Any recent hospitalizations or ED visits since last visit with CPP? No  What diet changes have been made to improve Blood Pressure Control?  Patient states he has limited salt intake and drinks plenty of water daily.  What exercise is being done to improve your Blood Pressure Control?  Patient states he doesn't move around much but does walk around the house and sits in his recliner mainly.  Adherence Review: Is the patient currently on ACE/ARB medication? Yes Does the patient have >5 day gap between last estimated fill dates? CPP to review  Care Gaps: Last annual wellness visit? 08-26-2021 Hep C screening overdue Shingrix overdue 3rd Moderna vaccine overdue last completed 12-18-2020  Star Rating Drugs: Valsartan 320 mg- Last filled 08-31-2021 90 DS Atorvastatin 10 mg- Last filled 07-16-2021 90 DS  [place] Clinical Pharmacist Assistant [phone]

## 2021-10-19 NOTE — Telephone Encounter (Signed)
Per note on 08/31/21 where BP was BP 138/60, patient is at goal. Will maintain therapy

## 2021-10-20 ENCOUNTER — Ambulatory Visit (INDEPENDENT_AMBULATORY_CARE_PROVIDER_SITE_OTHER): Payer: Medicare Other | Admitting: Family Medicine

## 2021-10-20 VITALS — BP 124/84 | HR 64 | Temp 97.8°F | Resp 18 | Ht 65.5 in | Wt 225.0 lb

## 2021-10-20 DIAGNOSIS — H6121 Impacted cerumen, right ear: Secondary | ICD-10-CM

## 2021-10-20 DIAGNOSIS — J301 Allergic rhinitis due to pollen: Secondary | ICD-10-CM | POA: Diagnosis not present

## 2021-10-20 DIAGNOSIS — R051 Acute cough: Secondary | ICD-10-CM

## 2021-10-20 DIAGNOSIS — H9191 Unspecified hearing loss, right ear: Secondary | ICD-10-CM | POA: Insufficient documentation

## 2021-10-20 HISTORY — DX: Acute cough: R05.1

## 2021-10-20 HISTORY — DX: Allergic rhinitis due to pollen: J30.1

## 2021-10-20 HISTORY — DX: Impacted cerumen, right ear: H61.21

## 2021-10-20 LAB — POC COVID19 BINAXNOW: SARS Coronavirus 2 Ag: NEGATIVE

## 2021-10-20 MED ORDER — MONTELUKAST SODIUM 10 MG PO TABS: 10.0000 mg | ORAL_TABLET | Freq: Every day | ORAL | 1 refills | Status: DC

## 2021-10-20 NOTE — Progress Notes (Signed)
Acute Office Visit  Subjective:    Patient ID: John Kemp, male    DOB: 30-Sep-1945, 76 y.o.   MRN: [id number]  Chief Complaint  Patient presents with   Facial Pain   Nasal Congestion   Otalgia    Right>left     HPI: Patient is in today for congestion, earaches (right > left), facial pain and cough. Negative covid 19.   Past Medical History:  Diagnosis Date   A-fib ([place])    Anticoagulated 01/21/2015   Atherosclerotic heart disease of native coronary artery without angina pectoris    Blepharitis of left lower eyelid 12/02/2019   CAD in native artery 01/21/2015   Cancer ([place])    skin cancer   Chest pain 04/02/2017   CHF (congestive heart failure) ([place]) 11/05/2019   Chronic diastolic (congestive) heart failure (HCC)    Chronic diastolic heart failure ([place]) 01/21/2015   Last Assessment & Plan:  Is congestive heart failure is mildly decompensated he is edematous he'll increase the dose of his diuretic today the lab work performed including an ARB referred to care transitions for home care he'll follow-up in my office with me in 4-6 weeks daily weights sodium restrictionand compliance of medications beinghemoglobin of will   Chronic obstructive pulmonary disease (COPD) ([place])    Dyspnea 12/31/2014   Followed in Pulmonary clinic/ [facility]/ [facility]  - amiodarone rx 11/2014 >>> - PFTs rec 12/31/2014     Heart attack ([place])    High cholesterol 10/20/2017   Hx of CABG 03/02/2017   Hyperlipidemia    Hypertension    Hypertensive heart disease 01/21/2015   Hypertensive heart disease with heart failure (HCC)    Hypothyroidism    Hypothyroidism (acquired) 04/24/2018   Impaired fasting glucose    Leg fatigue 03/03/2017   Obesity 04/02/2017   On amiodarone therapy 01/21/2015   PAF (paroxysmal atrial fibrillation) ([place])    Primary insomnia    Squamous cell carcinoma 11/05/2019   Stroke (cerebrum) ([place]) 09/2016   right superior quadrantanopsia   Stroke ([place])    Typical atrial flutter ([place])  01/21/2015   Vitamin D deficiency, unspecified     Past Surgical History:  Procedure Laterality Date   APPENDECTOMY  1956   CARDIAC CATHETERIZATION     CARDIOVERSION  2002   CHOLECYSTECTOMY  2006   CORONARY ARTERY BYPASS GRAFT  2001   HERNIA REPAIR     RADIOLOGY WITH ANESTHESIA Bilateral 01/18/2018   Procedure: MRI WITH ANESTHESIA LUMBAR SPINE WITH AND WITHOUT CONTRAST;  Surgeon: Radiologist, Medication, MD;  Location: [facility];  Service: Radiology;  Laterality: Bilateral;   ROTATOR CUFF REPAIR Left    TONSILLECTOMY  1975    Family History  Problem Relation Age of Onset   Heart disease Father    Arrhythmia Father    Breast cancer Mother    Diabetes type II Sister    Diabetes Brother     Social History   Socioeconomic History   Marital status: Married    Spouse name: Not on file   Number of children: Not on file   Years of education: Not on file   Highest education level: Not on file  Occupational History   Occupation: Retired  Tobacco Use   Smoking status: Former    Packs/day: 1.00    Years: 15.00    Pack years: 15.00    Types: Cigarettes    Quit date: 08/29/1978    Years since quitting: 43.1   Smokeless tobacco: Never  Vaping Use   Vaping Use: Never used  Substance and Sexual Activity   Alcohol use: No    Alcohol/week: 0.0 standard drinks   Drug use: No   Sexual activity: Not Currently  Other Topics Concern   Not on file  Social History Narrative   Not on file   Social Determinants of Health   Financial Resource Strain: High Risk   Difficulty of Paying Living Expenses: Very hard  Food Insecurity: No Food Insecurity   Worried About Charity fundraiser in the Last Year: Never true   Ran Out of Food in the Last Year: Never true  Transportation Needs: Not on file  Physical Activity: Not on file  Stress: Not on file  Social Connections: Not on file  Intimate Partner Violence: Not on file    Outpatient Medications Prior to Visit  Medication Sig Dispense  Refill   albuterol (PROVENTIL) (2.5 MG/3ML) 0.083% nebulizer solution Take 3 mLs (2.5 mg total) by nebulization every 6 (six) hours as needed for wheezing or shortness of breath. 75 mL 2   amiodarone (PACERONE) 200 MG tablet TAKE 1 TABLET BY MOUTH EVERY DAY 90 tablet 2   amLODipine (NORVASC) 5 MG tablet Take 1 tablet (5 mg total) by mouth daily. 90 tablet 2   atorvastatin (LIPITOR) 10 MG tablet TAKE 1 TABLET BY MOUTH EVERYDAY AT BEDTIME 90 tablet 1   carvedilol (COREG) 3.125 MG tablet TAKE 1 TABLET (3.125 MG TOTAL) BY MOUTH 2 (TWO) TIMES DAILY WITH A MEAL. 180 tablet 2   donepezil (ARICEPT) 10 MG tablet TAKE 1 TABLET BY MOUTH EVERYDAY AT BEDTIME 90 tablet 1   ELIQUIS 5 MG TABS tablet TAKE 1 TABLET BY MOUTH TWICE A DAY 180 tablet 1   furosemide (LASIX) 40 MG tablet TAKE 1 TABLET TWICE WEEKLY ON WEDNESDAY AND SATURDAY. 24 tablet 2   Inulin (FIBER CHOICE PO) Take 3 tablets by mouth daily.     levothyroxine (SYNTHROID) 150 MCG tablet Take 1 tablet (150 mcg total) by mouth daily before breakfast. 90 tablet 3   nitroGLYCERIN (NITROSTAT) 0.4 MG SL tablet Place 1 tablet (0.4 mg total) under the tongue every 5 (five) minutes as needed for chest pain. 25 tablet 5   ondansetron (ZOFRAN) 4 MG tablet Take 1 tablet (4 mg total) by mouth every 8 (eight) hours as needed for nausea or vomiting. 30 tablet 0   potassium chloride (K-DUR,KLOR-CON) 10 MEQ tablet Take 10 mEq by mouth See admin instructions. Take 10 mEq by mouth whenever you take furosemide     tamsulosin (FLOMAX) 0.4 MG CAPS capsule TAKE 1 CAPSULE BY MOUTH EVERY DAY 90 capsule 1   valsartan (DIOVAN) 320 MG tablet TAKE ONE TABLET BY MOUTH DAILY (Patient taking differently: Take 320 mg by mouth daily.) 90 tablet 3   Vitamin D, Ergocalciferol, (DRISDOL) 1.25 MG (50000 UNIT) CAPS capsule TAKE 1 CAPSULE (50,000 UNITS TOTAL) BY MOUTH EVERY SATURDAY. MORNING. 12 capsule 0   zolpidem (AMBIEN) 10 MG tablet Take 1 tablet (10 mg total) by mouth at bedtime as needed  for sleep. 90 tablet 1   No facility-administered medications prior to visit.    Allergies  Allergen Reactions   Avelox [Moxifloxacin Hcl]    Cartia Xt [Diltiazem Hcl] Nausea And Vomiting   Pravachol [Pravastatin Sodium] Other (See Comments)    myalgia   Prednisone Other (See Comments)    "makes me feel terrible"   Zetia [Ezetimibe] Other (See Comments)    myalgias  Review of Systems  Constitutional:  Positive for fatigue.  HENT:  Positive for ear pain, sinus pain and sore throat.   Respiratory:  Positive for cough.   Gastrointestinal:  Negative for nausea and vomiting.  Neurological:  Positive for headaches.  Psychiatric/Behavioral:  Negative for decreased concentration.       Objective:    Physical Exam Constitutional:      Appearance: Normal appearance.  HENT:     Right Ear: There is impacted cerumen.     Left Ear: Tympanic membrane, ear canal and external ear normal.     Nose: Congestion (pale edematous turbinates.) and rhinorrhea present.     Mouth/Throat:     Mouth: Mucous membranes are moist.     Pharynx: No oropharyngeal exudate or posterior oropharyngeal erythema.  Cardiovascular:     Rate and Rhythm: Normal rate and regular rhythm.     Heart sounds: Normal heart sounds.  Pulmonary:     Effort: Pulmonary effort is normal. No respiratory distress.     Breath sounds: Normal breath sounds. No wheezing, rhonchi or rales.  Lymphadenopathy:     Cervical: No cervical adenopathy.  Neurological:     Mental Status: He is alert.  Psychiatric:        Mood and Affect: Mood normal.        Behavior: Behavior normal.    BP 124/84    Pulse 64    Temp 97.8 F (36.6 C)    Resp 18    Ht 5' 5.5" (1.664 m)    Wt 225 lb (102.1 kg)    BMI 36.87 kg/m  Wt Readings from Last 3 Encounters:  10/20/21 225 lb (102.1 kg)  08/31/21 228 lb (103.4 kg)  08/26/21 229 lb (103.9 kg)    Health Maintenance Due  Topic Date Due   Hepatitis C Screening  Never done   Zoster Vaccines-  Shingrix (1 of 2) Never done   COVID-19 Vaccine (3 - Moderna risk series) 01/15/2021    There are no preventive care reminders to display for this patient.   Lab Results  Component Value Date   TSH 2.050 12/22/2020   Lab Results  Component Value Date   WBC 5.8 04/23/2021   HGB 14.5 04/23/2021   HCT 42.5 04/23/2021   MCV 90 04/23/2021   PLT 190 04/23/2021   Lab Results  Component Value Date   NA 141 04/23/2021   K 3.8 04/23/2021   CO2 24 04/23/2021   GLUCOSE 94 04/23/2021   BUN 15 04/23/2021   CREATININE 1.26 04/23/2021   BILITOT 1.0 04/23/2021   ALKPHOS 95 04/23/2021   AST 22 04/23/2021   ALT 21 04/23/2021   PROT 6.8 04/23/2021   ALBUMIN 4.5 04/23/2021   CALCIUM 9.3 04/23/2021   ANIONGAP 11 02/18/2021   EGFR 61 04/23/2021   Lab Results  Component Value Date   CHOL 134 04/23/2021   Lab Results  Component Value Date   HDL 39 (L) 04/23/2021   Lab Results  Component Value Date   LDLCALC 76 04/23/2021   Lab Results  Component Value Date   TRIG 99 04/23/2021   Lab Results  Component Value Date   CHOLHDL 3.4 04/23/2021   Lab Results  Component Value Date   HGBA1C 5.4 04/23/2021       Assessment & Plan:   Problem List Items Addressed This Visit       Respiratory   Non-seasonal allergic rhinitis due to pollen    Checked for covid  19 negative.  Continue present plan and medications. Start Singulair 10 mg daily.        Nervous and Auditory   Hearing loss of right ear due to cerumen impaction    Irrigation of right ear completed.         Other   Acute cough - Primary    covid 19 negative.       Relevant Orders   POC COVID-19 (Completed)   Meds ordered this encounter  Medications   montelukast (SINGULAIR) 10 MG tablet    Sig: Take 1 tablet (10 mg total) by mouth at bedtime.    Dispense:  30 tablet    Refill:  1    Orders Placed This Encounter  Procedures   POC COVID-19     Follow-up: No follow-ups on file.  An After Visit  Summary was printed and given to the patient.  [name], MD [facility] [phone]

## 2021-10-24 NOTE — Assessment & Plan Note (Addendum)
Checked for covid 19 negative.  Continue present plan and medications. Start Singulair 10 mg daily.

## 2021-10-25 ENCOUNTER — Encounter: Payer: Self-pay | Admitting: Family Medicine

## 2021-10-25 NOTE — Assessment & Plan Note (Signed)
covid 19 negative.  

## 2021-10-25 NOTE — Assessment & Plan Note (Signed)
Irrigation of right ear completed.

## 2021-10-28 ENCOUNTER — Ambulatory Visit (INDEPENDENT_AMBULATORY_CARE_PROVIDER_SITE_OTHER): Payer: Medicare Other | Admitting: Family Medicine

## 2021-10-28 ENCOUNTER — Encounter: Payer: Self-pay | Admitting: Family Medicine

## 2021-10-28 VITALS — BP 148/62 | HR 62 | Temp 97.7°F | Ht 65.5 in | Wt 236.0 lb

## 2021-10-28 DIAGNOSIS — J0100 Acute maxillary sinusitis, unspecified: Secondary | ICD-10-CM | POA: Insufficient documentation

## 2021-10-28 DIAGNOSIS — J301 Allergic rhinitis due to pollen: Secondary | ICD-10-CM | POA: Diagnosis not present

## 2021-10-28 DIAGNOSIS — J208 Acute bronchitis due to other specified organisms: Secondary | ICD-10-CM | POA: Insufficient documentation

## 2021-10-28 DIAGNOSIS — J019 Acute sinusitis, unspecified: Secondary | ICD-10-CM | POA: Insufficient documentation

## 2021-10-28 HISTORY — DX: Acute maxillary sinusitis, unspecified: J01.00

## 2021-10-28 HISTORY — DX: Acute bronchitis due to other specified organisms: J20.8

## 2021-10-28 MED ORDER — TRIAMCINOLONE ACETONIDE 40 MG/ML IJ SUSP
80.0000 mg | Freq: Once | INTRAMUSCULAR | Status: AC
  Administered 2021-10-20: 80 mg via INTRAMUSCULAR

## 2021-10-28 MED ORDER — AMOXICILLIN-POT CLAVULANATE 875-125 MG PO TABS: 1.0000 | ORAL_TABLET | Freq: Two times a day (BID) | ORAL | 0 refills | Status: DC

## 2021-10-28 NOTE — Assessment & Plan Note (Signed)
augmentin rx.  ?

## 2021-10-28 NOTE — Assessment & Plan Note (Addendum)
hydromet cough syrup ?Continue singulair ?

## 2021-10-28 NOTE — Patient Instructions (Signed)
Start Augmentin 875 mg one twice a day x 10 days. ? ?Hydromet cough syrup as directed.  ? ?Continue singulair 10 mg before bed  ?

## 2021-10-28 NOTE — Addendum Note (Signed)
Addended byRochel Brome on: 10/28/2021 03:12 PM   Modules accepted: Orders

## 2021-10-28 NOTE — Progress Notes (Signed)
Acute Office Visit  Subjective:    Patient ID: John Kemp, male    DOB: Mar 23, 1946, 76 y.o.   MRN: [id number]  Chief Complaint  Patient presents with   Cough/congestion worse    HPI Patient is in today for cough, congestion x 2 weeks. Treated as allergies and given kenalog shot and given singulair on 10/20/2021, but worsening anyway. Fatigue, runny nose, and headaches. NO fever.   Past Medical History:  Diagnosis Date   A-fib (HCC)    Anticoagulated 01/21/2015   Atherosclerotic heart disease of native coronary artery without angina pectoris    Blepharitis of left lower eyelid 12/02/2019   CAD in native artery 01/21/2015   Cancer (HCC)    skin cancer   Chest pain 04/02/2017   CHF (congestive heart failure) (HCC) 11/05/2019   Chronic diastolic (congestive) heart failure (HCC)    Chronic diastolic heart failure (HCC) 01/21/2015   Last Assessment & Plan:  Is congestive heart failure is mildly decompensated he is edematous he'll increase the dose of his diuretic today the lab work performed including an ARB referred to care transitions for home care he'll follow-up in my office with me in 4-6 weeks daily weights sodium restrictionand compliance of medications beinghemoglobin of will   Chronic obstructive pulmonary disease (COPD) (HCC)    Dyspnea 12/31/2014   Followed in Pulmonary clinic/ [facility]/ [facility]  - amiodarone rx 11/2014 >>> - PFTs rec 12/31/2014     Heart attack (HCC)    High cholesterol 10/20/2017   Hx of CABG 03/02/2017   Hyperlipidemia    Hypertension    Hypertensive heart disease 01/21/2015   Hypertensive heart disease with heart failure (HCC)    Hypothyroidism    Hypothyroidism (acquired) 04/24/2018   Impaired fasting glucose    Leg fatigue 03/03/2017   Obesity 04/02/2017   On amiodarone therapy 01/21/2015   PAF (paroxysmal atrial fibrillation) (HCC)    Primary insomnia    Squamous cell carcinoma 11/05/2019   Stroke (cerebrum) (HCC) 09/2016   right superior  quadrantanopsia   Stroke (HCC)    Typical atrial flutter (HCC) 01/21/2015   Vitamin D deficiency, unspecified     Past Surgical History:  Procedure Laterality Date   APPENDECTOMY  1956   CARDIAC CATHETERIZATION     CARDIOVERSION  2002   CHOLECYSTECTOMY  2006   CORONARY ARTERY BYPASS GRAFT  2001   HERNIA REPAIR     RADIOLOGY WITH ANESTHESIA Bilateral 01/18/2018   Procedure: MRI WITH ANESTHESIA LUMBAR SPINE WITH AND WITHOUT CONTRAST;  Surgeon: Radiologist, Medication, MD;  Location: [facility];  Service: Radiology;  Laterality: Bilateral;   ROTATOR CUFF REPAIR Left    TONSILLECTOMY  1975    Family History  Problem Relation Age of Onset   Heart disease Father    Arrhythmia Father    Breast cancer Mother    Diabetes type II Sister    Diabetes Brother     Social History   Socioeconomic History   Marital status: Married    Spouse name: Not on file   Number of children: Not on file   Years of education: Not on file   Highest education level: Not on file  Occupational History   Occupation: Retired  Tobacco Use   Smoking status: Former    Packs/day: 1.00    Years: 15.00    Pack years: 15.00    Types: Cigarettes    Quit date: 08/29/1978    Years since quitting: 43.1   Smokeless  tobacco: Never  Vaping Use   Vaping Use: Never used  Substance and Sexual Activity   Alcohol use: No    Alcohol/week: 0.0 standard drinks   Drug use: No   Sexual activity: Not Currently  Other Topics Concern   Not on file  Social History Narrative   Not on file   Social Determinants of Health   Financial Resource Strain: High Risk   Difficulty of Paying Living Expenses: Very hard  Food Insecurity: No Food Insecurity   Worried About Programme researcher, broadcasting/film/video in the Last Year: Never true   Ran Out of Food in the Last Year: Never true  Transportation Needs: Not on file  Physical Activity: Not on file  Stress: Not on file  Social Connections: Not on file  Intimate Partner Violence: Not on file     Outpatient Medications Prior to Visit  Medication Sig Dispense Refill   albuterol (PROVENTIL) (2.5 MG/3ML) 0.083% nebulizer solution Take 3 mLs (2.5 mg total) by nebulization every 6 (six) hours as needed for wheezing or shortness of breath. 75 mL 2   amiodarone (PACERONE) 200 MG tablet TAKE 1 TABLET BY MOUTH EVERY DAY 90 tablet 2   amLODipine (NORVASC) 5 MG tablet Take 1 tablet (5 mg total) by mouth daily. 90 tablet 2   atorvastatin (LIPITOR) 10 MG tablet TAKE 1 TABLET BY MOUTH EVERYDAY AT BEDTIME 90 tablet 1   carvedilol (COREG) 3.125 MG tablet TAKE 1 TABLET (3.125 MG TOTAL) BY MOUTH 2 (TWO) TIMES DAILY WITH A MEAL. 180 tablet 2   donepezil (ARICEPT) 10 MG tablet TAKE 1 TABLET BY MOUTH EVERYDAY AT BEDTIME 90 tablet 1   ELIQUIS 5 MG TABS tablet TAKE 1 TABLET BY MOUTH TWICE A DAY 180 tablet 1   furosemide (LASIX) 40 MG tablet TAKE 1 TABLET TWICE WEEKLY ON WEDNESDAY AND SATURDAY. 24 tablet 2   Inulin (FIBER CHOICE PO) Take 3 tablets by mouth daily.     levothyroxine (SYNTHROID) 150 MCG tablet Take 1 tablet (150 mcg total) by mouth daily before breakfast. 90 tablet 3   montelukast (SINGULAIR) 10 MG tablet Take 1 tablet (10 mg total) by mouth at bedtime. 30 tablet 1   nitroGLYCERIN (NITROSTAT) 0.4 MG SL tablet Place 1 tablet (0.4 mg total) under the tongue every 5 (five) minutes as needed for chest pain. 25 tablet 5   ondansetron (ZOFRAN) 4 MG tablet Take 1 tablet (4 mg total) by mouth every 8 (eight) hours as needed for nausea or vomiting. 30 tablet 0   potassium chloride (K-DUR,KLOR-CON) 10 MEQ tablet Take 10 mEq by mouth See admin instructions. Take 10 mEq by mouth whenever you take furosemide     tamsulosin (FLOMAX) 0.4 MG CAPS capsule TAKE 1 CAPSULE BY MOUTH EVERY DAY 90 capsule 1   valsartan (DIOVAN) 320 MG tablet TAKE ONE TABLET BY MOUTH DAILY (Patient taking differently: Take 320 mg by mouth daily.) 90 tablet 3   Vitamin D, Ergocalciferol, (DRISDOL) 1.25 MG (50000 UNIT) CAPS capsule TAKE  1 CAPSULE (50,000 UNITS TOTAL) BY MOUTH EVERY SATURDAY. MORNING. 12 capsule 0   zolpidem (AMBIEN) 10 MG tablet Take 1 tablet (10 mg total) by mouth at bedtime as needed for sleep. 90 tablet 1   No facility-administered medications prior to visit.    Allergies  Allergen Reactions   Avelox [Moxifloxacin Hcl]    Cartia Xt [Diltiazem Hcl] Nausea And Vomiting   Pravachol [Pravastatin Sodium] Other (See Comments)    myalgia   Prednisone Other (  See Comments)    "makes me feel terrible"   Zetia [Ezetimibe] Other (See Comments)    myalgias    Review of Systems  Constitutional:  Negative for chills, fatigue and fever.  HENT:  Negative for congestion, ear pain and sore throat.   Respiratory:  Negative for cough and shortness of breath.   Cardiovascular:  Negative for chest pain and leg swelling.  Gastrointestinal:  Negative for abdominal pain, constipation, diarrhea, nausea and vomiting.  Genitourinary:  Negative for dysuria and frequency.  Musculoskeletal:  Negative for arthralgias and myalgias.  Neurological:  Negative for dizziness and headaches.  Psychiatric/Behavioral:  Negative for dysphoric mood. The patient is not nervous/anxious.       Objective:    Physical Exam Vitals reviewed.  Constitutional:      Appearance: Normal appearance. He is obese.  HENT:     Right Ear: Tympanic membrane normal.     Left Ear: Tympanic membrane normal.     Nose: Rhinorrhea present.     Comments: BL maxillary sinus discomfort    Mouth/Throat:     Pharynx: No oropharyngeal exudate or posterior oropharyngeal erythema.  Cardiovascular:     Rate and Rhythm: Normal rate and regular rhythm.     Pulses: Normal pulses.     Heart sounds: Normal heart sounds.  Pulmonary:     Effort: Pulmonary effort is normal.     Breath sounds: Rhonchi (occasional.) present.  Neurological:     Mental Status: He is alert and oriented to person, place, and time.  Psychiatric:        Mood and Affect: Mood normal.         Behavior: Behavior normal.    BP (!) 148/62   Pulse 62   Temp 97.7 F (36.5 C)   Ht 5' 5.5" (1.664 m)   Wt 236 lb (107 kg)   BMI 38.68 kg/m  Wt Readings from Last 3 Encounters:  10/28/21 236 lb (107 kg)  10/20/21 225 lb (102.1 kg)  08/31/21 228 lb (103.4 kg)    Health Maintenance Due  Topic Date Due   Hepatitis C Screening  Never done   Zoster Vaccines- Shingrix (1 of 2) Never done   COVID-19 Vaccine (3 - Moderna risk series) 01/15/2021    There are no preventive care reminders to display for this patient.   Lab Results  Component Value Date   TSH 2.050 12/22/2020   Lab Results  Component Value Date   WBC 5.8 04/23/2021   HGB 14.5 04/23/2021   HCT 42.5 04/23/2021   MCV 90 04/23/2021   PLT 190 04/23/2021   Lab Results  Component Value Date   NA 141 04/23/2021   K 3.8 04/23/2021   CO2 24 04/23/2021   GLUCOSE 94 04/23/2021   BUN 15 04/23/2021   CREATININE 1.26 04/23/2021   BILITOT 1.0 04/23/2021   ALKPHOS 95 04/23/2021   AST 22 04/23/2021   ALT 21 04/23/2021   PROT 6.8 04/23/2021   ALBUMIN 4.5 04/23/2021   CALCIUM 9.3 04/23/2021   ANIONGAP 11 02/18/2021   EGFR 61 04/23/2021   Lab Results  Component Value Date   CHOL 134 04/23/2021   Lab Results  Component Value Date   HDL 39 (L) 04/23/2021   Lab Results  Component Value Date   LDLCALC 76 04/23/2021   Lab Results  Component Value Date   TRIG 99 04/23/2021   Lab Results  Component Value Date   CHOLHDL 3.4 04/23/2021   Lab Results  Component Value Date   HGBA1C 5.4 04/23/2021         Assessment & Plan:   Problem List Items Addressed This Visit       Respiratory   Non-seasonal allergic rhinitis due to pollen    Continue singulair      Acute non-recurrent maxillary sinusitis - Primary    augmentin rx.       Relevant Medications   amoxicillin-clavulanate (AUGMENTIN) 875-125 MG tablet   Acute bronchitis due to other specified organisms    hydromet cough syrup Continue  singulair       Meds ordered this encounter  Medications   amoxicillin-clavulanate (AUGMENTIN) 875-125 MG tablet    Sig: Take 1 tablet by mouth 2 (two) times daily.    Dispense:  20 tablet    Refill:  0   Follow up; as needed   Blane Ohara, MD

## 2021-10-28 NOTE — Assessment & Plan Note (Signed)
Continue singulair 

## 2021-10-29 ENCOUNTER — Other Ambulatory Visit: Payer: Self-pay

## 2021-10-29 MED ORDER — HYDROCODONE BIT-HOMATROP MBR 5-1.5 MG/5ML PO SOLN: 5.0000 mL | Freq: Four times a day (QID) | ORAL | 0 refills | Status: DC | PRN

## 2021-10-30 ENCOUNTER — Other Ambulatory Visit: Payer: Self-pay | Admitting: Cardiology

## 2021-10-30 DIAGNOSIS — I110 Hypertensive heart disease with heart failure: Secondary | ICD-10-CM

## 2021-10-30 NOTE — Progress Notes (Signed)
Cardiology Office Note:    Date:  11/01/2021   ID:  John Kemp, DOB 03-Mar-1946, MRN [id number]  PCP:  [name], MD  Cardiologist:  Shirlee More, MD    Referring MD: [name], MD    ASSESSMENT:    1. PAF (paroxysmal atrial fibrillation) ([place])   2. On amiodarone therapy   3. Anticoagulated   4. Sinus bradycardia   5. Hypertensive heart disease with chronic diastolic congestive heart failure ([place])   6. CAD in native artery   7. Pure hypercholesterolemia    PLAN:    In order of problems listed above:  Overall John Kemp is doing well maintaining sinus rhythm on low-dose amiodarone but he is having symptomatic bradycardia and I think at this point time we should stop his beta-blocker and carefully watch his blood pressure at home continue amiodarone check labs including liver function and thyroids for safety along with his anticoagulant. Heart failure is compensated he has no fluid overload continue his loop diuretic along with his antihypertensives amlodipine and valsartan recheck renal function potassium Stable CAD having no anginal discomfort following CABG and on current medical treatment including his anticoagulant antihypertensives and lipid-lowering.  This time I would not advise an ischemia evaluation Continue his high intensity statin check liver function lipid profile goal LDL less than 100 ideally less than 70   Next appointment: 6 months   Medication Adjustments/Labs and Tests Ordered: Current medicines are reviewed at length with the patient today.  Concerns regarding medicines are outlined above.  Orders Placed This Encounter  Procedures   Comprehensive metabolic panel   Lipid panel   TSH   T3, free   T4, free   EKG 12-Lead   No orders of the defined types were placed in this encounter.   Chief Complaint  Patient presents with   Atrial Fibrillation   Atrial Flutter   on amiodarone   Anticoagulation   Congestive Heart Failure   Hypertension    Coronary Artery Disease   Hyperlipidemia    History of Present Illness:    John Kemp is a 76 y.o. male with a hx of paroxysmal atrial fibrillation and flutter maintaining sinus rhythm on amiodarone, chronic anticoagulation, CAD hypertension with heart failure bilateral less than 50% ICA stenosis on carotid duplex and hyper lipidemia  last seen 05/11/2021. Compliance with diet, lifestyle and medications: Yes  Said a few episodes a flutter he is in atrial fibrillation aware of his heart beating his wife who is a retired Marine scientist told him his heart rate was slow. Today he is in sinus rhythm heart rate of 50 bpm blood pressure is controlled and I think we need to stop his beta-blocker They will monitor his blood pressure at home and if his systolics are consistently greater than 140 we will need to adjust his antihypertensive medications He is having no angina edema or shortness of breath palpitation or syncope. His last labs were in August of last year he is on amiodarone. Past Medical History:  Diagnosis Date   A-fib ([place])    Anticoagulated 01/21/2015   Atherosclerotic heart disease of native coronary artery without angina pectoris    Blepharitis of left lower eyelid 12/02/2019   CAD in native artery 01/21/2015   Cancer ([place])    skin cancer   Chest pain 04/02/2017   CHF (congestive heart failure) ([place]) 11/05/2019   Chronic diastolic (congestive) heart failure (HCC)    Chronic diastolic heart failure ([place]) 01/21/2015   Last Assessment &  Plan:  Is congestive heart failure is mildly decompensated he is edematous he'll increase the dose of his diuretic today the lab work performed including an ARB referred to care transitions for home care he'll follow-up in my office with me in 4-6 weeks daily weights sodium restrictionand compliance of medications beinghemoglobin of will   Chronic obstructive pulmonary disease (COPD) ([place])    Dyspnea 12/31/2014   Followed in Pulmonary clinic/ [facility]/  [place]  - amiodarone rx 11/2014 >>> - PFTs rec 12/31/2014     Heart attack ([place])    High cholesterol 10/20/2017   Hx of CABG 03/02/2017   Hyperlipidemia    Hypertension    Hypertensive heart disease 01/21/2015   Hypertensive heart disease with heart failure (HCC)    Hypothyroidism    Hypothyroidism (acquired) 04/24/2018   Impaired fasting glucose    Leg fatigue 03/03/2017   Obesity 04/02/2017   On amiodarone therapy 01/21/2015   PAF (paroxysmal atrial fibrillation) ([place])    Primary insomnia    Squamous cell carcinoma 11/05/2019   Stroke (cerebrum) ([place]) 09/2016   right superior quadrantanopsia   Stroke ([place])    Typical atrial flutter ([place]) 01/21/2015   Vitamin D deficiency, unspecified     Past Surgical History:  Procedure Laterality Date   APPENDECTOMY  1956   CARDIAC CATHETERIZATION     CARDIOVERSION  2002   CHOLECYSTECTOMY  2006   CORONARY ARTERY BYPASS GRAFT  2001   HERNIA REPAIR     RADIOLOGY WITH ANESTHESIA Bilateral 01/18/2018   Procedure: MRI WITH ANESTHESIA LUMBAR SPINE WITH AND WITHOUT CONTRAST;  Surgeon: Radiologist, Medication, MD;  Location: [place];  Service: Radiology;  Laterality: Bilateral;   ROTATOR CUFF REPAIR Left    TONSILLECTOMY  1975    Current Medications: Current Meds  Medication Sig   amiodarone (PACERONE) 200 MG tablet TAKE 1 TABLET BY MOUTH EVERY DAY   amLODipine (NORVASC) 5 MG tablet Take 1 tablet (5 mg total) by mouth daily.   amoxicillin-clavulanate (AUGMENTIN) 875-125 MG tablet Take 1 tablet by mouth 2 (two) times daily.   atorvastatin (LIPITOR) 10 MG tablet TAKE 1 TABLET BY MOUTH EVERYDAY AT BEDTIME   donepezil (ARICEPT) 10 MG tablet TAKE 1 TABLET BY MOUTH EVERYDAY AT BEDTIME   ELIQUIS 5 MG TABS tablet TAKE 1 TABLET BY MOUTH TWICE A DAY   furosemide (LASIX) 40 MG tablet TAKE 1 TABLET TWICE WEEKLY ON WEDNESDAY AND SATURDAY.   HYDROcodone bit-homatropine (HYDROMET) 5-1.5 MG/5ML syrup Take 5 mLs by mouth every 6 (six) hours as needed for cough.   Inulin  (FIBER CHOICE PO) Take 3 tablets by mouth daily.   levothyroxine (SYNTHROID) 150 MCG tablet Take 1 tablet (150 mcg total) by mouth daily before breakfast.   montelukast (SINGULAIR) 10 MG tablet Take 1 tablet (10 mg total) by mouth at bedtime.   nitroGLYCERIN (NITROSTAT) 0.4 MG SL tablet Place 1 tablet (0.4 mg total) under the tongue every 5 (five) minutes as needed for chest pain.   ondansetron (ZOFRAN) 4 MG tablet Take 1 tablet (4 mg total) by mouth every 8 (eight) hours as needed for nausea or vomiting.   potassium chloride (K-DUR,KLOR-CON) 10 MEQ tablet Take 10 mEq by mouth See admin instructions. Take 10 mEq by mouth whenever you take furosemide   tamsulosin (FLOMAX) 0.4 MG CAPS capsule TAKE 1 CAPSULE BY MOUTH EVERY DAY   valsartan (DIOVAN) 320 MG tablet TAKE ONE TABLET BY MOUTH DAILY (Patient taking differently: Take 320 mg by mouth daily.)  Vitamin D, Ergocalciferol, (DRISDOL) 1.25 MG (50000 UNIT) CAPS capsule TAKE 1 CAPSULE (50,000 UNITS TOTAL) BY MOUTH EVERY SATURDAY. MORNING.   zolpidem (AMBIEN) 10 MG tablet Take 1 tablet (10 mg total) by mouth at bedtime as needed for sleep.   [DISCONTINUED] carvedilol (COREG) 3.125 MG tablet TAKE 1 TABLET (3.125 MG TOTAL) BY MOUTH 2 (TWO) TIMES DAILY WITH A MEAL.     Allergies:   Avelox [moxifloxacin hcl], Cartia xt [diltiazem hcl], Pravachol [pravastatin sodium], Prednisone, and Zetia [ezetimibe]   Social History   Socioeconomic History   Marital status: Married    Spouse name: Not on file   Number of children: Not on file   Years of education: Not on file   Highest education level: Not on file  Occupational History   Occupation: Retired  Tobacco Use   Smoking status: Former    Packs/day: 1.00    Years: 15.00    Pack years: 15.00    Types: Cigarettes    Quit date: 08/29/1978    Years since quitting: 43.2   Smokeless tobacco: Never  Vaping Use   Vaping Use: Never used  Substance and Sexual Activity   Alcohol use: No    Alcohol/week:  0.0 standard drinks   Drug use: No   Sexual activity: Not Currently  Other Topics Concern   Not on file  Social History Narrative   Not on file   Social Determinants of Health   Financial Resource Strain: High Risk   Difficulty of Paying Living Expenses: Very hard  Food Insecurity: No Food Insecurity   Worried About Charity fundraiser in the Last Year: Never true   Ran Out of Food in the Last Year: Never true  Transportation Needs: Not on file  Physical Activity: Not on file  Stress: Not on file  Social Connections: Not on file     Family History: The patient's family history includes Arrhythmia in his father; Breast cancer in his mother; Diabetes in his brother; Diabetes type II in his sister; Heart disease in his father. ROS:   Please see the history of present illness.    All other systems reviewed and are negative.  EKGs/Labs/Other Studies Reviewed:    The following studies were reviewed today:  EKG:  EKG ordered today and personally reviewed.  The ekg ordered today demonstrates sinus bradycardia 50 bpm normal QT interval  Recent Labs: 12/22/2020: TSH 2.050 02/18/2021: B Natriuretic Peptide 199.8 04/23/2021: ALT 21; BUN 15; Creatinine, Ser 1.26; Hemoglobin 14.5; Platelets 190; Potassium 3.8; Sodium 141  Recent Lipid Panel    Component Value Date/Time   CHOL 134 04/23/2021 0953   TRIG 99 04/23/2021 0953   HDL 39 (L) 04/23/2021 0953   CHOLHDL 3.4 04/23/2021 0953   CHOLHDL 5.9 04/02/2017 0213   VLDL 35 04/02/2017 0213   LDLCALC 76 04/23/2021 0953    Physical Exam:    VS:  BP 130/78 (BP Location: Right Arm)    Pulse (!) 50    Ht 5' 5.5" (1.664 m)    Wt 224 lb 6.4 oz (101.8 kg)    SpO2 99%    BMI 36.77 kg/m     Wt Readings from Last 3 Encounters:  11/01/21 224 lb 6.4 oz (101.8 kg)  10/28/21 236 lb (107 kg)  10/20/21 225 lb (102.1 kg)     GEN:  Well nourished, well developed in no acute distress HEENT: Normal NECK: No JVD; No carotid bruits LYMPHATICS: No  lymphadenopathy CARDIAC: RRR, no murmurs, rubs,  gallops RESPIRATORY:  Clear to auscultation without rales, wheezing or rhonchi  ABDOMEN: Soft, non-tender, non-distended MUSCULOSKELETAL:  No edema; No deformity  SKIN: Warm and dry NEUROLOGIC:  Alert and oriented x 3 PSYCHIATRIC:  Normal affect    Signed, Shirlee More, MD  11/01/2021 11:21 AM    [place]

## 2021-11-01 ENCOUNTER — Encounter: Payer: Self-pay | Admitting: Cardiology

## 2021-11-01 ENCOUNTER — Other Ambulatory Visit: Payer: Self-pay

## 2021-11-01 ENCOUNTER — Ambulatory Visit (INDEPENDENT_AMBULATORY_CARE_PROVIDER_SITE_OTHER): Payer: Medicare Other | Admitting: Cardiology

## 2021-11-01 VITALS — BP 130/78 | HR 50 | Ht 65.5 in | Wt 224.4 lb

## 2021-11-01 DIAGNOSIS — I5032 Chronic diastolic (congestive) heart failure: Secondary | ICD-10-CM | POA: Diagnosis not present

## 2021-11-01 DIAGNOSIS — I2510 Atherosclerotic heart disease of native coronary artery without angina pectoris: Secondary | ICD-10-CM

## 2021-11-01 DIAGNOSIS — I480 Paroxysmal atrial fibrillation: Secondary | ICD-10-CM

## 2021-11-01 DIAGNOSIS — R001 Bradycardia, unspecified: Secondary | ICD-10-CM | POA: Diagnosis not present

## 2021-11-01 DIAGNOSIS — E7800 Pure hypercholesterolemia, unspecified: Secondary | ICD-10-CM

## 2021-11-01 DIAGNOSIS — Z7901 Long term (current) use of anticoagulants: Secondary | ICD-10-CM | POA: Diagnosis not present

## 2021-11-01 DIAGNOSIS — I110 Hypertensive heart disease with heart failure: Secondary | ICD-10-CM | POA: Diagnosis not present

## 2021-11-01 DIAGNOSIS — Z79899 Other long term (current) drug therapy: Secondary | ICD-10-CM | POA: Diagnosis not present

## 2021-11-01 NOTE — Patient Instructions (Signed)
Medication Instructions:  ?Your physician has recommended you make the following change in your medication: Discontinue Carvedilol ? ?*If you need a refill on your cardiac medications before your next appointment, please call your pharmacy* ? ? ?Lab Work: ?Your physician recommends that you return for lab work in: Today for CMP, Lipid panel, TSH, Free T4 and T3 ? ?If you have labs (blood work) drawn today and your tests are completely normal, you will receive your results only by: ?MyChart Message (if you have MyChart) OR ?A paper copy in the mail ?If you have any lab test that is abnormal or we need to change your treatment, we will call you to review the results. ? ? ?Testing/Procedures: ?NONE ? ? ?Follow-Up: ?At [facility], you and your health needs are our priority.  As part of our continuing mission to provide you with exceptional heart care, we have created designated Provider Care Teams.  These Care Teams include your primary Cardiologist (physician) and Advanced Practice Providers (APPs -  Physician Assistants and Nurse Practitioners) who all work together to provide you with the care you need, when you need it. ? ?We recommend signing up for the patient portal called "MyChart".  Sign up information is provided on this After Visit Summary.  MyChart is used to connect with patients for Virtual Visits (Telemedicine).  Patients are able to view lab/test results, encounter notes, upcoming appointments, etc.  Non-urgent messages can be sent to your provider as well.   ?To learn more about what you can do with MyChart, go to [email or web address].   ? ?Your next appointment:   ?6 month(s) ? ?The format for your next appointment:   ?In Person ? ?Provider:   ?Shirlee More, MD  ? ? ?Other Instructions ?IF BP IS FREQUENTLY HIGHER THAN 56D (SYSTOLIC) TOP NUMBER SEND DR. Lytle  ?

## 2021-11-02 ENCOUNTER — Other Ambulatory Visit: Payer: Self-pay

## 2021-11-02 ENCOUNTER — Telehealth: Payer: Self-pay | Admitting: Cardiology

## 2021-11-02 LAB — COMPREHENSIVE METABOLIC PANEL
ALT: 19 IU/L (ref 0–44)
AST: 22 IU/L (ref 0–40)
Albumin/Globulin Ratio: 1.7 (ref 1.2–2.2)
Albumin: 4.3 g/dL (ref 3.7–4.7)
Alkaline Phosphatase: 95 IU/L (ref 44–121)
BUN/Creatinine Ratio: 12 (ref 10–24)
BUN: 14 mg/dL (ref 8–27)
Bilirubin Total: 0.8 mg/dL (ref 0.0–1.2)
CO2: 27 mmol/L (ref 20–29)
Calcium: 8.9 mg/dL (ref 8.6–10.2)
Chloride: 103 mmol/L (ref 96–106)
Creatinine, Ser: 1.15 mg/dL (ref 0.76–1.27)
Globulin, Total: 2.5 g/dL (ref 1.5–4.5)
Glucose: 84 mg/dL (ref 70–99)
Potassium: 4.2 mmol/L (ref 3.5–5.2)
Sodium: 142 mmol/L (ref 134–144)
Total Protein: 6.8 g/dL (ref 6.0–8.5)
eGFR: 67 mL/min/{1.73_m2} (ref >59)

## 2021-11-02 LAB — LIPID PANEL
Chol/HDL Ratio: 2.9 ratio (ref 0.0–5.0)
Cholesterol, Total: 123 mg/dL (ref 100–199)
HDL: 43 mg/dL (ref >39)
LDL Chol Calc (NIH): 67 mg/dL (ref 0–99)
Triglycerides: 63 mg/dL (ref 0–149)
VLDL Cholesterol Cal: 13 mg/dL (ref 5–40)

## 2021-11-02 LAB — T3, FREE: T3, Free: 1.8 pg/mL — ABNORMAL LOW (ref 2.0–4.4)

## 2021-11-02 LAB — T4, FREE: Free T4: 2.51 ng/dL — ABNORMAL HIGH (ref 0.82–1.77)

## 2021-11-02 LAB — TSH: TSH: 0.222 u[IU]/mL — ABNORMAL LOW (ref 0.450–4.500)

## 2021-11-02 MED ORDER — LEVOTHYROXINE SODIUM 137 MCG PO TABS: 137.0000 ug | ORAL_TABLET | Freq: Every day | ORAL | 3 refills | Status: DC

## 2021-11-02 NOTE — Telephone Encounter (Signed)
Patient informed of results and medication change per Dr. Tobie Poet recommendation. Orders for medication change were placed. ?

## 2021-11-02 NOTE — Telephone Encounter (Signed)
Patient was returning call. Please advise ?

## 2021-11-09 DIAGNOSIS — R531 Weakness: Secondary | ICD-10-CM | POA: Diagnosis not present

## 2021-11-09 DIAGNOSIS — R110 Nausea: Secondary | ICD-10-CM | POA: Diagnosis not present

## 2021-11-09 DIAGNOSIS — R42 Dizziness and giddiness: Secondary | ICD-10-CM | POA: Diagnosis not present

## 2021-11-09 DIAGNOSIS — I480 Paroxysmal atrial fibrillation: Secondary | ICD-10-CM | POA: Diagnosis not present

## 2021-11-11 ENCOUNTER — Other Ambulatory Visit: Payer: Self-pay | Admitting: Family Medicine

## 2021-11-11 ENCOUNTER — Other Ambulatory Visit: Payer: Self-pay

## 2021-11-11 ENCOUNTER — Telehealth: Payer: Self-pay

## 2021-11-11 MED ORDER — ATORVASTATIN CALCIUM 10 MG PO TABS: ORAL_TABLET | ORAL | 1 refills | Status: DC

## 2021-11-11 NOTE — Telephone Encounter (Signed)
Refill sent to pharmacy.   

## 2021-11-11 NOTE — Chronic Care Management (AMB) (Signed)
? ? ?Chronic Care Management ?Pharmacy Assistant  ? ?Name: John Kemp  MRN: [id number] DOB: 1946/03/21 ? ?Reason for Encounter: Disease State/ Hypertension ? ?Recent office visits:  ?10-28-2021 John Brome, MD. Visit for cough/congestion. START Augmentin 875-125 mg twice daily for 10 days. ? ?10-20-2021 John Brome, MD. Visit for nasal congestion. START Singulair 10 mg daily. Negative covid test. Kenelog injection given.  ? ?Recent consult visits:  ?11-01-2021 John Priest, MD (Cardiology). T3= 1.8, T4= 2.51. TSH= 0.222. EKG completed. STOP carvedilol and proventil nebulizer.  ? ?Hospital visits:  ?Medication Reconciliation was completed by comparing discharge summary, patient?s EMR and Pharmacy list, and upon discussion with patient. ? ?Admitted to the hospital on 11-09-2021 due to syncope. Discharge date was 11-09-2021. Discharged from [facility].   ? ?New?Medications Started at [facility] Discharge:?? ?None ? ?Medication Changes at Hospital Discharge: ?None ? ?Medications Discontinued at Hospital Discharge: ?None ? ?Medications that remain the same after Hospital Discharge:??  ?-All other medications will remain the same.   ? ?Medications: ?Outpatient Encounter Medications as of 11/11/2021  ?Medication Sig  ? amiodarone (PACERONE) 200 MG tablet TAKE 1 TABLET BY MOUTH EVERY DAY  ? amLODipine (NORVASC) 5 MG tablet Take 1 tablet (5 mg total) by mouth daily.  ? amoxicillin-clavulanate (AUGMENTIN) 875-125 MG tablet Take 1 tablet by mouth 2 (two) times daily.  ? atorvastatin (LIPITOR) 10 MG tablet TAKE 1 TABLET BY MOUTH EVERYDAY AT BEDTIME  ? donepezil (ARICEPT) 10 MG tablet TAKE 1 TABLET BY MOUTH EVERYDAY AT BEDTIME  ? ELIQUIS 5 MG TABS tablet TAKE 1 TABLET BY MOUTH TWICE A DAY  ? furosemide (LASIX) 40 MG tablet TAKE 1 TABLET TWICE WEEKLY ON WEDNESDAY AND SATURDAY.  ? HYDROcodone bit-homatropine (HYDROMET) 5-1.5 MG/5ML syrup Take 5 mLs by mouth every 6 (six) hours as needed for cough.  ? Inulin (FIBER  CHOICE PO) Take 3 tablets by mouth daily.  ? levothyroxine (SYNTHROID) 137 MCG tablet Take 1 tablet (137 mcg total) by mouth daily before breakfast.  ? montelukast (SINGULAIR) 10 MG tablet Take 1 tablet (10 mg total) by mouth at bedtime.  ? nitroGLYCERIN (NITROSTAT) 0.4 MG SL tablet Place 1 tablet (0.4 mg total) under the tongue every 5 (five) minutes as needed for chest pain.  ? ondansetron (ZOFRAN) 4 MG tablet Take 1 tablet (4 mg total) by mouth every 8 (eight) hours as needed for nausea or vomiting.  ? potassium chloride (K-DUR,KLOR-CON) 10 MEQ tablet Take 10 mEq by mouth See admin instructions. Take 10 mEq by mouth whenever you take furosemide  ? tamsulosin (FLOMAX) 0.4 MG CAPS capsule TAKE 1 CAPSULE BY MOUTH EVERY DAY  ? valsartan (DIOVAN) 320 MG tablet TAKE ONE TABLET BY MOUTH DAILY (Patient taking differently: Take 320 mg by mouth daily.)  ? Vitamin D, Ergocalciferol, (DRISDOL) 1.25 MG (50000 UNIT) CAPS capsule TAKE 1 CAPSULE (50,000 UNITS TOTAL) BY MOUTH EVERY SATURDAY. MORNING.  ? zolpidem (AMBIEN) 10 MG tablet Take 1 tablet (10 mg total) by mouth at bedtime as needed for sleep.  ? ?No facility-administered encounter medications on file as of 11/11/2021.  ? ?Recent Office Vitals: ?BP Readings from Last 3 Encounters:  ?11/01/21 130/78  ?10/28/21 (!) 148/62  ?10/20/21 124/84  ? ?Pulse Readings from Last 3 Encounters:  ?11/01/21 (!) 50  ?10/28/21 62  ?10/20/21 64  ?  ?Wt Readings from Last 3 Encounters:  ?11/01/21 224 lb 6.4 oz (101.8 kg)  ?10/28/21 236 lb (107 kg)  ?10/20/21 225 lb (102.1 kg)  ?  ? ?  Kidney Function ?Lab Results  ?Component Value Date/Time  ? CREATININE 1.15 11/01/2021 11:15 AM  ? CREATININE 1.26 04/23/2021 09:53 AM  ? GFRNONAA >60 02/18/2021 08:11 AM  ? GFRAA 54 (L) 09/21/2020 10:05 AM  ? ? ?BMP Latest Ref Rng & Units 11/01/2021 04/23/2021 02/18/2021  ?Glucose 70 - 99 mg/dL 84 94 97  ?BUN 8 - 27 mg/dL '14 15 8  '$ ?Creatinine 0.76 - 1.27 mg/dL 1.15 1.26 1.18  ?BUN/Creat Ratio 10 - '24 12 12 '$ -  ?Sodium  134 - 144 mmol/L 142 141 135  ?Potassium 3.5 - 5.2 mmol/L 4.2 3.8 3.9  ?Chloride 96 - 106 mmol/L 103 102 99  ?CO2 20 - 29 mmol/L '27 24 25  '$ ?Calcium 8.6 - 10.2 mg/dL 8.9 9.3 9.2  ? ? ? ?Current antihypertensive regimen:  ?Amlodipine 5 mg bid ?Furosemide 40 mg twice weekly ?Valsartan 320 mg daily ?Potassium 10 meq daily prn  ? Patient verbally confirms he is taking the above medications as directed. Yes ? ?How often are you checking your Blood Pressure? Every other day ? ?he checks his blood pressure before taking medications and some days after taking medications. ?Current home BP readings: 03-10 122/62 53, 03-14 131/80 64, 03-13 132/71 51 ?Wrist or arm cuff: Both ?Caffeine intake: Drinks half a can of diet pepsi weekly now ?Salt intake: Limited ?OTC medications including pseudoephedrine or NSAIDs? None ? ?Any readings above 180/120? No ?  ?What recent interventions/DTPs have been made by any provider to improve Blood Pressure control since last CPP Visit:  ?Educated on BP goals and benefits of medications for prevention of heart attack, stroke and kidney damage; ?Daily salt intake goal < 2300 mg; ?Exercise goal of 150 minutes per week; ?Importance of home blood pressure monitoring; ?-Counseled to monitor BP at home weekly, document, and provide log at future appointments ?-Counseled on diet and exercise extensively ? ?Any recent hospitalizations or ED visits since last visit with CPP? Yes ? ?What diet changes have been made to improve Blood Pressure Control?  ?Patient states he has limited salt intake and drinks plenty of water daily. ? ?What exercise is being done to improve your Blood Pressure Control?  ?Patient states he doesn't move around much but does walk around the house and sits in his recliner mainly. ? ?Adherence Review: ?Is the patient currently on ACE/ARB medication? Yes ?Does the patient have >5 day gap between last estimated fill dates? CPP to review ? ?Care Gaps: ?Last annual wellness visit?  08-26-2021 ?Hep C screening overdue ?Shingrix overdue ?3rd Moderna vaccine overdue last completed 12-18-2020 ? ?Star Rating Drugs: ?Valsartan 320 mg- Last filled 08-31-2021 90 DS ?Atorvastatin 10 mg- Last filled 07-16-2021 90 DS (Patient still has 90 DS bottle filled on 07-16-2021 and will start in a few days. No refills left sent request to pool) ? ?Malecca Hicks CMA ?Clinical Pharmacist Assistant ?[phone] ? ?

## 2021-11-12 ENCOUNTER — Ambulatory Visit: Payer: Medicare Other | Admitting: Cardiology

## 2021-11-12 ENCOUNTER — Other Ambulatory Visit: Payer: Self-pay

## 2021-11-12 DIAGNOSIS — I4891 Unspecified atrial fibrillation: Secondary | ICD-10-CM | POA: Insufficient documentation

## 2021-11-12 DIAGNOSIS — I25810 Atherosclerosis of coronary artery bypass graft(s) without angina pectoris: Secondary | ICD-10-CM

## 2021-11-12 DIAGNOSIS — R42 Dizziness and giddiness: Secondary | ICD-10-CM

## 2021-11-12 DIAGNOSIS — I4819 Other persistent atrial fibrillation: Secondary | ICD-10-CM | POA: Insufficient documentation

## 2021-11-12 DIAGNOSIS — M75102 Unspecified rotator cuff tear or rupture of left shoulder, not specified as traumatic: Secondary | ICD-10-CM

## 2021-11-12 DIAGNOSIS — R7989 Other specified abnormal findings of blood chemistry: Secondary | ICD-10-CM | POA: Insufficient documentation

## 2021-11-12 DIAGNOSIS — I2510 Atherosclerotic heart disease of native coronary artery without angina pectoris: Secondary | ICD-10-CM | POA: Insufficient documentation

## 2021-11-12 DIAGNOSIS — I4892 Unspecified atrial flutter: Secondary | ICD-10-CM | POA: Insufficient documentation

## 2021-11-12 DIAGNOSIS — E785 Hyperlipidemia, unspecified: Secondary | ICD-10-CM

## 2021-11-12 HISTORY — DX: Other persistent atrial fibrillation: I48.19

## 2021-11-12 HISTORY — DX: Other specified abnormal findings of blood chemistry: R79.89

## 2021-11-12 HISTORY — DX: Unspecified atrial fibrillation: I48.91

## 2021-11-12 HISTORY — DX: Dizziness and giddiness: R42

## 2021-11-12 HISTORY — DX: Unspecified atrial flutter: I48.92

## 2021-11-12 HISTORY — DX: Unspecified rotator cuff tear or rupture of left shoulder, not specified as traumatic: M75.102

## 2021-11-12 HISTORY — DX: Hyperlipidemia, unspecified: E78.5

## 2021-11-12 HISTORY — DX: Atherosclerosis of coronary artery bypass graft(s) without angina pectoris: I25.810

## 2021-11-14 NOTE — Progress Notes (Signed)
?Cardiology Office Note:   ? ?Date:  11/15/2021  ? ?ID:  John Kemp, DOB 1946-06-30, MRN [id number] ? ?PCP:  John Brome, MD  ?Cardiologist:  John More, MD   ? ?Referring MD: John Brome, MD  ? ? ?ASSESSMENT:   ? ?1. Near syncope   ? ?PLAN:   ? ?In order of problems listed above: ? ?Unfortunately I do not have the strips that he handcarried to my office the ED note says he had atrial fibrillation he is on amiodarone we will going apply a 30-day monitor today to try to sort out whether or not he is having clinical recurrence of atrial fibrillation and/or bradycardia.  Now he will remain on his amiodarone 200 mg daily we had stopped his beta-blocker at a previous visit.  His heart failure and CAD are stable.  At this time he is anticoagulated continue Eliquis. ? ? ?Next appointment: 6 weeks see ? ? ?Medication Adjustments/Labs and Tests Ordered: ?Current medicines are reviewed at length with the patient today.  Concerns regarding medicines are outlined above.  ?No orders of the defined types were placed in this encounter. ? ?No orders of the defined types were placed in this encounter. ? ? ?He was directed to be seen by the ED when discharged 11/09/2021 ? ?History of Present Illness:   ? ?John Kemp is a 76 y.o. male with paroxysmal atrial fibrillation and flutter maintaining sinus rhythm on amiodarone, chronic anticoagulation, CAD hypertension with heart failure bilateral less than 50% ICA stenosis on carotid duplex and hyperlipidemia  last seen 11/01/2021. ? ?Compliance with diet, lifestyle and medications: Yes ? ?Complex visit his wife is an Therapist, sports retired was at the home he was profoundly weak could not stand up nausea diaphoresis went to the ED. ?Dr. Elenore Kemp in the ED ran off monitor strips and/or EKGs gave him to the patient and told him to handcarried them to my office for me to look at ?I am unsure if these are the same EKGs that are in his chart shows sinus rhythm ?His wife did not go to the  emergency room with him. ?He has had no recurrence of the same ?No chest pain syncope palpitation shortness of breath or edema ?He is seen in the ED [facility] 11/09/2021 after an episode where he was diaphoretic weak and had near loss of consciousness symptoms persisted for in the range of 15 to 20 minutes.  Seen in the ED his vital signs unremarkable except for blood pressure 161/74.  Laboratory studies were normal hemoglobin 14.3 creatinine 1.10 GFR greater than 60 cc proBNP level was low at 265 troponin level 0.02.  The ED doctor described the monitor showing paroxysmal atrial fibrillation.  Chest x-ray showed no active disease.  EKG independently reviewed by me shows sinus rhythm nonspecific conduction delay for monitoring strips included showing sinus rhythm.  No documentation of atrial fibrillation ?Past Medical History:  ?Diagnosis Date  ? A-fib ([place])   ? Acquired thrombophilia ([place]) 11/17/2019  ? Acute bronchitis due to other specified organisms 10/28/2021  ? Acute cough 10/20/2021  ? Acute non-recurrent maxillary sinusitis 10/28/2021  ? Anticoagulated 01/21/2015  ? Atherosclerotic heart disease of native coronary artery without angina pectoris   ? Atrial fibrillation with rapid ventricular response ([place]) 11/12/2021  ? Atrial flutter with rapid ventricular response ([place]) 11/12/2021  ? CAD in native artery 01/21/2015  ? Cancer [facility])   ? skin cancer  ? CHF (congestive heart failure) ([place]) 11/05/2019  ? Chronic diastolic (  congestive) heart failure ([place])   ? Chronic diastolic heart failure ([place]) 01/21/2015  ? Last Assessment & Plan:  Is congestive heart failure is mildly decompensated he is edematous he'll increase the dose of his diuretic today the lab work performed including an ARB referred to care transitions for home care he'll follow-up in my office with me in 4-6 weeks daily weights sodium restrictionand compliance of medications beinghemoglobin of will  ? Chronic obstructive pulmonary disease (COPD) ([place])    ? Coronary artery disease involving autologous artery coronary bypass graft 11/12/2021  ? 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 11/17/2019  ? Dizziness 11/12/2021  ? Dyslipidemia 11/12/2021  ? Elevated brain natriuretic peptide (BNP) level 11/12/2021  ? Essential hypertension 01/21/2015  ? right superior quadrantanopsia  ? Hearing loss of right ear due to cerumen impaction 10/20/2021  ? High cholesterol 10/20/2017  ? Hx of CABG 03/02/2017  ? Hypertension   ? Hypertensive heart disease with heart failure ([place])   ? Hypothyroidism (acquired) 04/24/2018  ? Impaired fasting glucose   ? Left rotator cuff tear 11/12/2021  ? Mixed hyperlipidemia 11/17/2019  ? Non-seasonal allergic rhinitis due to pollen 10/20/2021  ? On amiodarone therapy 01/21/2015  ? PAF (paroxysmal atrial fibrillation) ([place])   ? Primary insomnia   ? Sequela of cardioembolic stroke 03/17/9469  ? Severe obesity with body mass index (BMI) of 36.0 to 36.9 with serious comorbidity ([place])   ? Squamous cell carcinoma 11/05/2019  ? Typical atrial flutter ([place]) 01/21/2015  ? Vitamin D deficiency, unspecified   ? ? ?Past Surgical History:  ?Procedure Laterality Date  ? APPENDECTOMY  1956  ? CARDIAC CATHETERIZATION    ? CARDIOVERSION  2002  ? CHOLECYSTECTOMY  2006  ? CORONARY ARTERY BYPASS GRAFT  2001  ? HERNIA REPAIR    ? RADIOLOGY WITH ANESTHESIA Bilateral 01/18/2018  ? Procedure: MRI WITH ANESTHESIA LUMBAR SPINE WITH AND WITHOUT CONTRAST;  Surgeon: Radiologist, Medication, MD;  Location: [place];  Service: Radiology;  Laterality: Bilateral;  ? ROTATOR CUFF REPAIR Left   ? TONSILLECTOMY  1975  ? ? ?Current Medications: ?Current Meds  ?Medication Sig  ? amiodarone (PACERONE) 200 MG tablet TAKE 1 TABLET BY MOUTH EVERY DAY  ? amLODipine (NORVASC) 5 MG tablet Take 1 tablet (5 mg total) by mouth daily.  ? atorvastatin (LIPITOR) 10 MG tablet TAKE 1 TABLET BY MOUTH EVERYDAY AT BEDTIME  ? donepezil (ARICEPT) 10 MG tablet TAKE 1 TABLET BY MOUTH EVERYDAY AT BEDTIME  ?  ELIQUIS 5 MG TABS tablet TAKE 1 TABLET BY MOUTH TWICE A DAY  ? furosemide (LASIX) 40 MG tablet Take 40 mg by mouth 2 (two) times a week. On Wednesday and Saturday  ? Inulin (FIBER CHOICE PO) Take 3 tablets by mouth daily.  ? levothyroxine (SYNTHROID) 137 MCG tablet Take 1 tablet (137 mcg total) by mouth daily before breakfast.  ? nitroGLYCERIN (NITROSTAT) 0.4 MG SL tablet Place 0.4 mg under the tongue every 5 (five) minutes as needed for chest pain.  ? ondansetron (ZOFRAN) 4 MG tablet Take 1 tablet (4 mg total) by mouth every 8 (eight) hours as needed for nausea or vomiting.  ? potassium chloride (KLOR-CON M) 10 MEQ tablet Take 10 mEq by mouth 2 (two) times a week. On Wednesday and Saturday with Lasix  ? tamsulosin (FLOMAX) 0.4 MG CAPS capsule TAKE 1 CAPSULE BY MOUTH EVERY DAY  ? valsartan (DIOVAN) 320 MG tablet TAKE ONE TABLET BY MOUTH DAILY (Patient taking differently: Take 320 mg by  mouth daily.)  ? zolpidem (AMBIEN) 10 MG tablet Take 1 tablet (10 mg total) by mouth at bedtime as needed for sleep.  ?  ? ?Allergies:   Avelox [moxifloxacin hcl], Cartia xt [diltiazem hcl], Pravachol [pravastatin sodium], Prednisone, and Zetia [ezetimibe]  ? ?Social History  ? ?Socioeconomic History  ? Marital status: Married  ?  Spouse name: Not on file  ? Number of children: Not on file  ? Years of education: Not on file  ? Highest education level: Not on file  ?Occupational History  ? Occupation: Retired  ?Tobacco Use  ? Smoking status: Former  ?  Packs/day: 1.00  ?  Years: 15.00  ?  Pack years: 15.00  ?  Types: Cigarettes  ?  Quit date: 08/29/1978  ?  Years since quitting: 43.2  ? Smokeless tobacco: Never  ?Vaping Use  ? Vaping Use: Never used  ?Substance and Sexual Activity  ? Alcohol use: No  ?  Alcohol/week: 0.0 standard drinks  ? Drug use: No  ? Sexual activity: Not Currently  ?Other Topics Concern  ? Not on file  ?Social History Narrative  ? Not on file  ? ?Social Determinants of Health  ? ?Financial Resource Strain: High Risk   ? Difficulty of Paying Living Expenses: Very hard  ?Food Insecurity: No Food Insecurity  ? Worried About Charity fundraiser in the Last Year: Never true  ? Ran Out of Food in the Last Year: Never true  ?T

## 2021-11-15 ENCOUNTER — Ambulatory Visit (INDEPENDENT_AMBULATORY_CARE_PROVIDER_SITE_OTHER): Payer: Medicare Other | Admitting: Cardiology

## 2021-11-15 ENCOUNTER — Other Ambulatory Visit: Payer: Self-pay

## 2021-11-15 ENCOUNTER — Encounter: Payer: Self-pay | Admitting: Cardiology

## 2021-11-15 VITALS — BP 158/74 | HR 58 | Ht 70.0 in | Wt 226.4 lb

## 2021-11-15 DIAGNOSIS — R55 Syncope and collapse: Secondary | ICD-10-CM | POA: Diagnosis not present

## 2021-11-15 NOTE — Patient Instructions (Addendum)
Medication Instructions:  ?Your physician recommends that you continue on your current medications as directed. Please refer to the Current Medication list given to you today. ? ?*If you need a refill on your cardiac medications before your next appointment, please call your pharmacy* ? ? ?Lab Work: ?None ordered ?If you have labs (blood work) drawn today and your tests are completely normal, you will receive your results only by: ?MyChart Message (if you have MyChart) OR ?A paper copy in the mail ?If you have any lab test that is abnormal or we need to change your treatment, we will call you to review the results. ? ? ?Testing/Procedures: ?Your physician has recommended that you wear an event monitor. Event monitors are medical devices that record the heart?s electrical activity. Doctors most often [place] these monitors to diagnose arrhythmias. Arrhythmias are problems with the speed or rhythm of the heartbeat. The monitor is a small, portable device. You can wear one while you do your normal daily activities. This is usually used to diagnose what is causing palpitations/syncope (passing out). ? ? ? ? ?Follow-Up: ?At [facility], you and your health needs are our priority.  As part of our continuing mission to provide you with exceptional heart care, we have created designated Provider Care Teams.  These Care Teams include your primary Cardiologist (physician) and Advanced Practice Providers (APPs -  Physician Assistants and Nurse Practitioners) who all work together to provide you with the care you need, when you need it. ? ?We recommend signing up for the patient portal called "MyChart".  Sign up information is provided on this After Visit Summary.  MyChart is used to connect with patients for Virtual Visits (Telemedicine).  Patients are able to view lab/test results, encounter notes, upcoming appointments, etc.  Non-urgent messages can be sent to your provider as well.   ?To learn more about what you can do with  MyChart, go to [email or web address].   ? ?Your next appointment:   ?6 week(s) ? ?The format for your next appointment:   ?In Person ? ?Provider:   ?Shirlee More, MD ? ? ?Other Instructions ?NA  ?

## 2021-11-16 ENCOUNTER — Ambulatory Visit (INDEPENDENT_AMBULATORY_CARE_PROVIDER_SITE_OTHER): Payer: Medicare Other | Admitting: Family Medicine

## 2021-11-16 VITALS — BP 118/62 | HR 60 | Temp 97.4°F | Resp 16 | Ht 70.0 in | Wt 223.0 lb

## 2021-11-16 DIAGNOSIS — R42 Dizziness and giddiness: Secondary | ICD-10-CM | POA: Diagnosis not present

## 2021-11-16 DIAGNOSIS — I480 Paroxysmal atrial fibrillation: Secondary | ICD-10-CM

## 2021-11-16 MED ORDER — FLUTICASONE PROPIONATE 50 MCG/ACT NA SUSP: 2.0000 | Freq: Every day | NASAL | 6 refills | Status: DC

## 2021-11-16 MED ORDER — MONTELUKAST SODIUM 10 MG PO TABS: 10.0000 mg | ORAL_TABLET | Freq: Every day | ORAL | 5 refills | Status: DC

## 2021-11-16 NOTE — Progress Notes (Signed)
Subjective:  Patient ID: John Kemp, male    DOB: 1946/07/20  Age: 76 y.o. MRN: [id number]  Chief Complaint  Patient presents with   Atrial Fibrillation    HPI John Kemp come in for hospital follow-up He was seen at [facility] ED on 11/09/2021.  He reports going to bed in his usual state of good health and awakened in a drenching sweat.  When he got up to go to the bathroom he was generally weak, dizzy and nearly passed out.  His symptoms lasted for 15-20 minutes before resolving.  During that time he had no chest pain and no shortness of breath.  Patient's monitor showed paroxysmal  atrial fibrillation.  Troponin x 2 negative. Cbc normal, cmp normal.  Patient saw Dr. Dulce Sellar one week prior to ED visit. Dr. Dulce Sellar has ordered another holtor monitor. Has been fine since he has been home.   Current Outpatient Medications on File Prior to Visit  Medication Sig Dispense Refill   amiodarone (PACERONE) 200 MG tablet TAKE 1 TABLET BY MOUTH EVERY DAY 90 tablet 2   amLODipine (NORVASC) 5 MG tablet Take 1 tablet (5 mg total) by mouth daily. 90 tablet 1   atorvastatin (LIPITOR) 10 MG tablet TAKE 1 TABLET BY MOUTH EVERYDAY AT BEDTIME 90 tablet 1   donepezil (ARICEPT) 10 MG tablet TAKE 1 TABLET BY MOUTH EVERYDAY AT BEDTIME 90 tablet 1   ELIQUIS 5 MG TABS tablet TAKE 1 TABLET BY MOUTH TWICE A DAY 180 tablet 1   furosemide (LASIX) 40 MG tablet Take 40 mg by mouth 2 (two) times a week. On Wednesday and Saturday     Inulin (FIBER CHOICE PO) Take 3 tablets by mouth daily.     levothyroxine (SYNTHROID) 137 MCG tablet Take 1 tablet (137 mcg total) by mouth daily before breakfast. 90 tablet 3   nitroGLYCERIN (NITROSTAT) 0.4 MG SL tablet Place 0.4 mg under the tongue every 5 (five) minutes as needed for chest pain.     ondansetron (ZOFRAN) 4 MG tablet Take 1 tablet (4 mg total) by mouth every 8 (eight) hours as needed for nausea or vomiting. 30 tablet 0   potassium chloride (KLOR-CON M) 10 MEQ  tablet Take 10 mEq by mouth 2 (two) times a week. On Wednesday and Saturday with Lasix     tamsulosin (FLOMAX) 0.4 MG CAPS capsule TAKE 1 CAPSULE BY MOUTH EVERY DAY 90 capsule 1   valsartan (DIOVAN) 320 MG tablet TAKE ONE TABLET BY MOUTH DAILY (Patient taking differently: Take 320 mg by mouth daily.) 90 tablet 3   zolpidem (AMBIEN) 10 MG tablet Take 1 tablet (10 mg total) by mouth at bedtime as needed for sleep. 90 tablet 1   No current facility-administered medications on file prior to visit.   Past Medical History:  Diagnosis Date   A-fib (HCC)    Acquired thrombophilia (HCC) 11/17/2019   Acute bronchitis due to other specified organisms 10/28/2021   Acute cough 10/20/2021   Acute non-recurrent maxillary sinusitis 10/28/2021   Anticoagulated 01/21/2015   Atherosclerotic heart disease of native coronary artery without angina pectoris    Atrial fibrillation with rapid ventricular response (HCC) 11/12/2021   Atrial flutter with rapid ventricular response (HCC) 11/12/2021   CAD in native artery 01/21/2015   Cancer (HCC)    skin cancer   CHF (congestive heart failure) (HCC) 11/05/2019   Chronic diastolic (congestive) heart failure (HCC)    Chronic diastolic heart failure (HCC) 01/21/2015   Last Assessment &  Plan:  Is congestive heart failure is mildly decompensated he is edematous he'll increase the dose of his diuretic today the lab work performed including an ARB referred to care transitions for home care he'll follow-up in my office with me in 4-6 weeks daily weights sodium restrictionand compliance of medications beinghemoglobin of will   Chronic obstructive pulmonary disease (COPD) (HCC)    Coronary artery disease involving autologous artery coronary bypass graft 11/12/2021   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 11/17/2019   Dizziness 11/12/2021   Dyslipidemia 11/12/2021   Elevated brain natriuretic peptide (BNP) level 11/12/2021   Essential hypertension 01/21/2015   right  superior quadrantanopsia   Hearing loss of right ear due to cerumen impaction 10/20/2021   High cholesterol 10/20/2017   Hx of CABG 03/02/2017   Hypertension    Hypertensive heart disease with heart failure (HCC)    Hypothyroidism (acquired) 04/24/2018   Impaired fasting glucose    Left rotator cuff tear 11/12/2021   Mixed hyperlipidemia 11/17/2019   Non-seasonal allergic rhinitis due to pollen 10/20/2021   On amiodarone therapy 01/21/2015   PAF (paroxysmal atrial fibrillation) (HCC)    Primary insomnia    Sequela of cardioembolic stroke 11/17/2019   Severe obesity with body mass index (BMI) of 36.0 to 36.9 with serious comorbidity (HCC)    Squamous cell carcinoma 11/05/2019   Typical atrial flutter (HCC) 01/21/2015   Vitamin D deficiency, unspecified    Past Surgical History:  Procedure Laterality Date   APPENDECTOMY  1956   CARDIAC CATHETERIZATION     CARDIOVERSION  2002   CHOLECYSTECTOMY  2006   CORONARY ARTERY BYPASS GRAFT  2001   HERNIA REPAIR     RADIOLOGY WITH ANESTHESIA Bilateral 01/18/2018   Procedure: MRI WITH ANESTHESIA LUMBAR SPINE WITH AND WITHOUT CONTRAST;  Surgeon: Radiologist, Medication, MD;  Location: [facility] OR;  Service: Radiology;  Laterality: Bilateral;   ROTATOR CUFF REPAIR Left    TONSILLECTOMY  1975    Family History  Problem Relation Age of Onset   Heart disease Father    Arrhythmia Father    Breast cancer Mother    Diabetes type II Sister    Diabetes Brother    Social History   Socioeconomic History   Marital status: Married    Spouse name: Not on file   Number of children: Not on file   Years of education: Not on file   Highest education level: Not on file  Occupational History   Occupation: Retired  Tobacco Use   Smoking status: Former    Packs/day: 1.00    Years: 15.00    Pack years: 15.00    Types: Cigarettes    Quit date: 08/29/1978    Years since quitting: 43.2   Smokeless tobacco: Never  Vaping Use   Vaping Use: Never used  Substance  and Sexual Activity   Alcohol use: No    Alcohol/week: 0.0 standard drinks   Drug use: No   Sexual activity: Not Currently  Other Topics Concern   Not on file  Social History Narrative   Not on file   Social Determinants of Health   Financial Resource Strain: High Risk   Difficulty of Paying Living Expenses: Very hard  Food Insecurity: No Food Insecurity   Worried About Programme researcher, broadcasting/film/video in the Last Year: Never true   Ran Out of Food in the Last Year: Never true  Transportation Needs: Not on file  Physical Activity: Not on file  Stress: Not  on file  Social Connections: Not on file    Review of Systems  Constitutional:  Negative for chills and fever.  HENT:  Positive for congestion and tinnitus (right ear). Negative for rhinorrhea and sore throat.   Respiratory:  Negative for cough and shortness of breath.   Cardiovascular:  Positive for chest pain. Negative for palpitations.  Gastrointestinal:  Negative for abdominal pain, constipation, diarrhea, nausea and vomiting.  Genitourinary:  Negative for dysuria and urgency.  Musculoskeletal:  Negative for arthralgias, back pain and myalgias.  Neurological:  Positive for light-headedness. Negative for dizziness, syncope and headaches.  Psychiatric/Behavioral:  Negative for dysphoric mood. The patient is not nervous/anxious.     Objective:  BP 118/62   Pulse 60   Temp (!) 97.4 F (36.3 C)   Resp 16   Ht 5\' 10"  (1.778 m)   Wt 223 lb (101.2 kg)   BMI 32.00 kg/m      11/22/2021    8:31 PM 11/15/2021    7:53 AM 11/01/2021   10:46 AM  BP/Weight  Systolic BP 118 158 130  Diastolic BP 62 74 78  Wt. (Lbs) 223 226.4 224.4  BMI 32 kg/m2 32.49 kg/m2 36.77 kg/m2    Physical Exam Vitals reviewed.  Constitutional:      Appearance: Normal appearance. He is obese.  Neck:     Vascular: No carotid bruit.  Cardiovascular:     Rate and Rhythm: Normal rate and regular rhythm.     Pulses: Normal pulses.     Heart sounds: Normal  heart sounds.  Pulmonary:     Effort: Pulmonary effort is normal.     Breath sounds: Normal breath sounds. No wheezing, rhonchi or rales.  Neurological:     Mental Status: He is alert and oriented to person, place, and time.  Psychiatric:        Mood and Affect: Mood normal.        Behavior: Behavior normal.    Diabetic Foot Exam - Simple   No data filed      Lab Results  Component Value Date   WBC 5.8 04/23/2021   HGB 14.5 04/23/2021   HCT 42.5 04/23/2021   PLT 190 04/23/2021   GLUCOSE 84 11/01/2021   CHOL 123 11/01/2021   TRIG 63 11/01/2021   HDL 43 11/01/2021   LDLCALC 67 11/01/2021   ALT 19 11/01/2021   AST 22 11/01/2021   NA 142 11/01/2021   K 4.2 11/01/2021   CL 103 11/01/2021   CREATININE 1.15 11/01/2021   BUN 14 11/01/2021   CO2 27 11/01/2021   TSH 0.222 (L) 11/01/2021   INR 1.3 (H) 02/18/2021   HGBA1C 5.4 04/23/2021   MICROALBUR Positive 150 01/23/2019      Assessment & Plan:   Problem List Items Addressed This Visit       Cardiovascular and Mediastinum   A-fib (HCC) - Primary    Continue current medications.  Defer management to Dr. Dulce Sellar.  Await holtor monitor.         Other   Dizziness    Very likely due to paroxysmal atrial fibrillation.      .  Meds ordered this encounter  Medications   montelukast (SINGULAIR) 10 MG tablet    Sig: Take 1 tablet (10 mg total) by mouth at bedtime.    Dispense:  30 tablet    Refill:  5   fluticasone (FLONASE) 50 MCG/ACT nasal spray    Sig: Place 2 sprays into both  nostrils daily.    Dispense:  16 g    Refill:  6    No orders of the defined types were placed in this encounter.    Follow-up: Return in about 3 months (around 02/28/2022) for chronic fasting.  An After Visit Summary was printed and given to the patient.  Blane Ohara, MD [facility] [phone]

## 2021-11-22 ENCOUNTER — Encounter: Payer: Self-pay | Admitting: Family Medicine

## 2021-11-22 NOTE — Assessment & Plan Note (Signed)
Very likely due to paroxysmal atrial fibrillation.  ?

## 2021-11-22 NOTE — Assessment & Plan Note (Signed)
Continue current medications.  ?Defer management to Dr. Bettina Gavia.  ?Await holtor monitor.  ?

## 2021-11-29 ENCOUNTER — Emergency Department (HOSPITAL_COMMUNITY)
Admission: EM | Admit: 2021-11-29 | Discharge: 2021-11-29 | Disposition: A | Discharge status: Home / Self Care | Payer: Medicare Other | Attending: Emergency Medicine | Admitting: Emergency Medicine

## 2021-11-29 ENCOUNTER — Emergency Department (HOSPITAL_COMMUNITY): Payer: Medicare Other

## 2021-11-29 ENCOUNTER — Other Ambulatory Visit: Payer: Self-pay

## 2021-11-29 ENCOUNTER — Encounter (HOSPITAL_COMMUNITY): Payer: Self-pay

## 2021-11-29 ENCOUNTER — Telehealth: Payer: Self-pay | Admitting: Cardiology

## 2021-11-29 DIAGNOSIS — Z794 Long term (current) use of insulin: Secondary | ICD-10-CM | POA: Diagnosis not present

## 2021-11-29 DIAGNOSIS — R609 Edema, unspecified: Secondary | ICD-10-CM | POA: Insufficient documentation

## 2021-11-29 DIAGNOSIS — R600 Localized edema: Secondary | ICD-10-CM | POA: Insufficient documentation

## 2021-11-29 DIAGNOSIS — M4802 Spinal stenosis, cervical region: Secondary | ICD-10-CM | POA: Diagnosis not present

## 2021-11-29 DIAGNOSIS — Z79899 Other long term (current) drug therapy: Secondary | ICD-10-CM | POA: Insufficient documentation

## 2021-11-29 DIAGNOSIS — I2510 Atherosclerotic heart disease of native coronary artery without angina pectoris: Secondary | ICD-10-CM | POA: Insufficient documentation

## 2021-11-29 DIAGNOSIS — R29818 Other symptoms and signs involving the nervous system: Secondary | ICD-10-CM | POA: Diagnosis not present

## 2021-11-29 DIAGNOSIS — R531 Weakness: Secondary | ICD-10-CM | POA: Diagnosis not present

## 2021-11-29 DIAGNOSIS — R42 Dizziness and giddiness: Secondary | ICD-10-CM

## 2021-11-29 DIAGNOSIS — Z7901 Long term (current) use of anticoagulants: Secondary | ICD-10-CM | POA: Diagnosis not present

## 2021-11-29 DIAGNOSIS — I517 Cardiomegaly: Secondary | ICD-10-CM | POA: Diagnosis not present

## 2021-11-29 DIAGNOSIS — I4891 Unspecified atrial fibrillation: Secondary | ICD-10-CM | POA: Diagnosis not present

## 2021-11-29 DIAGNOSIS — I739 Peripheral vascular disease, unspecified: Secondary | ICD-10-CM | POA: Diagnosis not present

## 2021-11-29 DIAGNOSIS — Z951 Presence of aortocoronary bypass graft: Secondary | ICD-10-CM | POA: Diagnosis not present

## 2021-11-29 DIAGNOSIS — I672 Cerebral atherosclerosis: Secondary | ICD-10-CM | POA: Diagnosis not present

## 2021-11-29 LAB — CBC WITH DIFFERENTIAL/PLATELET
Abs Immature Granulocytes: 0.05 10*3/uL (ref 0.00–0.07)
Basophils Absolute: 0.1 10*3/uL (ref 0.0–0.1)
Basophils Relative: 1 %
Eosinophils Absolute: 0.1 10*3/uL (ref 0.0–0.5)
Eosinophils Relative: 1 %
HCT: 41.9 % (ref 39.0–52.0)
Hemoglobin: 14.6 g/dL (ref 13.0–17.0)
Immature Granulocytes: 1 %
Lymphocytes Relative: 19 %
Lymphs Abs: 1.4 10*3/uL (ref 0.7–4.0)
MCH: 31.9 pg (ref 26.0–34.0)
MCHC: 34.8 g/dL (ref 30.0–36.0)
MCV: 91.5 fL (ref 80.0–100.0)
Monocytes Absolute: 0.7 10*3/uL (ref 0.1–1.0)
Monocytes Relative: 10 %
Neutro Abs: 5.2 10*3/uL (ref 1.7–7.7)
Neutrophils Relative %: 68 %
Platelets: 168 10*3/uL (ref 150–400)
RBC: 4.58 MIL/uL (ref 4.22–5.81)
RDW: 13.2 % (ref 11.5–15.5)
WBC: 7.5 10*3/uL (ref 4.0–10.5)
nRBC: 0 % (ref 0.0–0.2)

## 2021-11-29 LAB — TROPONIN I (HIGH SENSITIVITY)
Troponin I (High Sensitivity): 53 ng/L — ABNORMAL HIGH (ref <18)
Troponin I (High Sensitivity): 50 ng/L — ABNORMAL HIGH (ref <18)

## 2021-11-29 LAB — COMPREHENSIVE METABOLIC PANEL
ALT: 21 U/L (ref 0–44)
AST: 20 U/L (ref 15–41)
Albumin: 3.7 g/dL (ref 3.5–5.0)
Alkaline Phosphatase: 66 U/L (ref 38–126)
Anion gap: 8 (ref 5–15)
BUN: 16 mg/dL (ref 8–23)
CO2: 23 mmol/L (ref 22–32)
Calcium: 8.6 mg/dL — ABNORMAL LOW (ref 8.9–10.3)
Chloride: 103 mmol/L (ref 98–111)
Creatinine, Ser: 1.12 mg/dL (ref 0.61–1.24)
GFR, Estimated: >60 mL/min (ref >60)
Glucose, Bld: 104 mg/dL — ABNORMAL HIGH (ref 70–99)
Potassium: 3.5 mmol/L (ref 3.5–5.1)
Sodium: 134 mmol/L — ABNORMAL LOW (ref 135–145)
Total Bilirubin: 0.9 mg/dL (ref 0.3–1.2)
Total Protein: 6.3 g/dL — ABNORMAL LOW (ref 6.5–8.1)

## 2021-11-29 LAB — PROTIME-INR
INR: 1.2 (ref 0.8–1.2)
Prothrombin Time: 15.6 seconds — ABNORMAL HIGH (ref 11.4–15.2)

## 2021-11-29 LAB — MAGNESIUM: Magnesium: 2.1 mg/dL (ref 1.7–2.4)

## 2021-11-29 LAB — BRAIN NATRIURETIC PEPTIDE: B Natriuretic Peptide: 136.3 pg/mL — ABNORMAL HIGH (ref 0.0–100.0)

## 2021-11-29 LAB — CBG MONITORING, ED: Glucose-Capillary: 104 mg/dL — ABNORMAL HIGH (ref 70–99)

## 2021-11-29 NOTE — Telephone Encounter (Signed)
Wife is calling to say she haven't received the heart monitor as well as the patient is in the ER. Please advise ?

## 2021-11-29 NOTE — ED Provider Notes (Signed)
?[facility] ?Provider Note ? ? ?CSN: [id number] ?Arrival date & time: 11/29/21  0325 ? ?  ? ?History ? ?Chief Complaint  ?Patient presents with  ? Dizziness  ? ? ?John Kemp is a 76 y.o. male with history of paroxysmal atrial fibrillation and atrial flutter as well as coronary artery disease status post CABG who is anticoagulated on Eliquis who presents to the emergency department with concern for episode of lightheadedness in the night when he woke to go to the restroom.  He states he was too lightheaded and weak to stand on his own and therefore his wife called EMS.  He denies any headache, blurry or double vision, loss of vision.  Denies any numbness or tingling in the extremities or weakness focally.  He denies any chest pain, shortness of breath, or palpitations.  He states he had a similar episode a few weeks ago and was seen at [facility] and told he was in atrial fibrillation and directed back to his cardiologist. ? ?It appears on chart review that patient is rate controlled for his atrial fibrillation with amiodarone, he states he is not missed any doses of his Eliquis or his amiodarone recently.  He denies any known sick contacts states he had a sinus infection a few weeks ago but has been feeling well this past week. ? ?In addition to the above listed concerns patient has history of hypertension, squamous cell skin cancer, hyperlipidemia and vitamin D deficiency. ? ?HPI ? ?  ? ?Home Medications ?Prior to Admission medications   ?Medication Sig Start Date End Date Taking? Authorizing Provider  ?amiodarone (PACERONE) 200 MG tablet TAKE 1 TABLET BY MOUTH EVERY DAY ?Patient taking differently: Take 200 mg by mouth daily. 04/06/21  Yes [name], MD  ?amLODipine (NORVASC) 5 MG tablet Take 1 tablet (5 mg total) by mouth daily. 11/01/21  Yes [name], MD  ?atorvastatin (LIPITOR) 10 MG tablet TAKE 1 TABLET BY MOUTH EVERYDAY AT BEDTIME ?Patient  taking differently: Take 10 mg by mouth at bedtime. 11/11/21  Yes Cox, Kirsten, MD  ?donepezil (ARICEPT) 10 MG tablet TAKE 1 TABLET BY MOUTH EVERYDAY AT BEDTIME ?Patient taking differently: Take 10 mg by mouth at bedtime. 06/22/21  Yes Cox, Kirsten, MD  ?ELIQUIS 5 MG TABS tablet TAKE 1 TABLET BY MOUTH TWICE A DAY ?Patient taking differently: Take 5 mg by mouth 2 (two) times daily. 08/09/21  Yes Cox, Kirsten, MD  ?fluticasone (FLONASE) 50 MCG/ACT nasal spray Place 2 sprays into both nostrils daily. 11/16/21  Yes Cox, Kirsten, MD  ?furosemide (LASIX) 40 MG tablet Take 40 mg by mouth daily as needed for fluid or edema.   Yes [provider]  ?Inulin (FIBER CHOICE PO) Take 3 tablets by mouth daily.   Yes [provider]  ?levothyroxine (SYNTHROID) 137 MCG tablet Take 1 tablet (137 mcg total) by mouth daily before breakfast. 11/02/21  Yes Cox, Kirsten, MD  ?montelukast (SINGULAIR) 10 MG tablet Take 1 tablet (10 mg total) by mouth at bedtime. ?Patient taking differently: Take 10 mg by mouth at bedtime as needed (allergies). 11/16/21  Yes Cox, Kirsten, MD  ?nitroGLYCERIN (NITROSTAT) 0.4 MG SL tablet Place 0.4 mg under the tongue every 5 (five) minutes as needed for chest pain.   Yes [provider]  ?potassium chloride (KLOR-CON M) 10 MEQ tablet Take 10 mEq by mouth daily as needed (take with furosemide).   Yes [provider]  ?valsartan (DIOVAN) 320  MG tablet TAKE ONE TABLET BY MOUTH DAILY ?Patient taking differently: Take 320 mg by mouth daily. 12/21/20  Yes Cox, Kirsten, MD  ?zolpidem (AMBIEN) 10 MG tablet Take 1 tablet (10 mg total) by mouth at bedtime as needed for sleep. ?Patient taking differently: Take 10 mg by mouth at bedtime. 09/30/21 03/29/22 Yes Cox, Kirsten, MD  ?ondansetron (ZOFRAN) 4 MG tablet Take 1 tablet (4 mg total) by mouth every 8 (eight) hours as needed for nausea or vomiting. 07/26/21   Cox, Elnita Maxwell, MD  ?tamsulosin (FLOMAX) 0.4 MG CAPS capsule TAKE 1 CAPSULE BY MOUTH  EVERY DAY ?Patient taking differently: Take 0.4 mg by mouth daily after supper. 09/02/21   [name], MD  ?   ? ?Allergies    ?Avelox [moxifloxacin hcl], Cartia xt [diltiazem hcl], Pravachol [pravastatin sodium], Prednisone, and Zetia [ezetimibe]   ? ?Review of Systems   ?Review of Systems  ?Constitutional: Negative.   ?HENT: Negative.    ?Eyes: Negative.   ?Respiratory: Negative.    ?Cardiovascular: Negative.   ?Gastrointestinal: Negative.   ?Genitourinary: Negative.   ?Musculoskeletal: Negative.   ?Neurological:  Positive for weakness and light-headedness. Negative for dizziness, tremors, syncope, facial asymmetry, speech difficulty and headaches.  ? ?Physical Exam ?Updated Vital Signs ?BP (!) 131/94   Pulse 73   Temp 97.7 ?F (36.5 ?C)   Resp 17   SpO2 98%  ?Physical Exam ?Vitals and nursing note reviewed.  ?Constitutional:   ?   Appearance: He is not ill-appearing or toxic-appearing.  ?HENT:  ?   Head: Normocephalic and atraumatic.  ?   Nose: Nose normal.  ?   Mouth/Throat:  ?   Mouth: Mucous membranes are moist.  ?   Pharynx: Oropharynx is clear. Uvula midline. No oropharyngeal exudate or posterior oropharyngeal erythema.  ?   Tonsils: No tonsillar exudate.  ?Eyes:  ?   General: Lids are normal. Vision grossly intact.     ?   Right eye: No discharge.     ?   Left eye: No discharge.  ?   Extraocular Movements: Extraocular movements intact.  ?   Conjunctiva/sclera: Conjunctivae normal.  ?   Pupils: Pupils are equal, round, and reactive to light.  ?   Visual Fields: Right eye visual fields normal and left eye visual fields normal.  ?   Comments: Horizontal nystagmus towards the right with EOMs  ?Neck:  ?   Trachea: Trachea and phonation normal.  ?Cardiovascular:  ?   Rate and Rhythm: Normal rate and regular rhythm.  ?   Pulses: Normal pulses.  ?   Heart sounds: Normal heart sounds. No murmur heard. ?Pulmonary:  ?   Effort: Pulmonary effort is normal. No tachypnea, bradypnea, accessory muscle usage, prolonged  expiration or respiratory distress.  ?   Breath sounds: Normal breath sounds. No wheezing or rales.  ?Chest:  ?   Chest wall: No mass, lacerations, deformity, swelling, tenderness, crepitus or edema.  ?Abdominal:  ?   General: Bowel sounds are normal. There is no distension.  ?   Palpations: Abdomen is soft.  ?   Tenderness: There is no abdominal tenderness. There is no right CVA tenderness, left CVA tenderness, guarding or rebound.  ?Musculoskeletal:     ?   General: No deformity.  ?   Cervical back: Normal range of motion and neck supple.  ?   Right lower leg: 1+ Edema present.  ?   Left lower leg: 1+ Edema present.  ?Lymphadenopathy:  ?  Cervical: No cervical adenopathy.  ?Skin: ?   General: Skin is warm and dry.  ?   Capillary Refill: Capillary refill takes less than 2 seconds.  ?Neurological:  ?   General: No focal deficit present.  ?   Mental Status: He is alert and oriented to person, place, and time. Mental status is at baseline.  ?   GCS: GCS eye subscore is 4. GCS verbal subscore is 5. GCS motor subscore is 6.  ?   Cranial Nerves: Cranial nerves 2-12 are intact.  ?   Sensory: Sensation is intact.  ?   Motor: Motor function is intact.  ?   Coordination: Coordination is intact.  ?Psychiatric:     ?   Mood and Affect: Mood normal.  ? ? ?ED Results / Procedures / Treatments   ?Labs ?(all labs ordered are listed, but only abnormal results are displayed) ?Labs Reviewed  ?COMPREHENSIVE METABOLIC PANEL - Abnormal; Notable for the following components:  ?    Result Value  ? Sodium 134 (*)   ? Glucose, Bld 104 (*)   ? Calcium 8.6 (*)   ? Total Protein 6.3 (*)   ? All other components within normal limits  ?BRAIN NATRIURETIC PEPTIDE - Abnormal; Notable for the following components:  ? B Natriuretic Peptide 136.3 (*)   ? All other components within normal limits  ?PROTIME-INR - Abnormal; Notable for the following components:  ? Prothrombin Time 15.6 (*)   ? All other components within normal limits  ?CBG MONITORING,  ED - Abnormal; Notable for the following components:  ? Glucose-Capillary 104 (*)   ? All other components within normal limits  ?TROPONIN I (HIGH SENSITIVITY) - Abnormal; Notable for the following components:  ?

## 2021-11-29 NOTE — ED Notes (Signed)
Reviewed discharge instructions with patient. Follow-up care reviewed. Patient verbalized understanding. Patient A&Ox4, VSS, and ambulatory with steady gait upon discharge.  

## 2021-11-29 NOTE — ED Provider Notes (Signed)
Care of patient handed off to me at shift change from [place], [place].  Please see her note for full work-up.  Briefly this is a 76 year old male who presents to the emergency department for intermittent lightheadedness and diaphoresis over the past month.  He was seen previously at [facility] and told that he had been in atrial fibrillation.  He states that he got up last night and became lightheaded and weak so presents to the emergency department.   ?Physical Exam  ?BP (!) 131/94   Pulse 73   Temp 97.7 ?F (36.5 ?C)   Resp 17   SpO2 98%  ? ?Physical Exam ?Vitals and nursing note reviewed.  ?Constitutional:   ?   Appearance: Normal appearance.  ?HENT:  ?   Head: Normocephalic and atraumatic.  ?Eyes:  ?   General: No scleral icterus. ?Pulmonary:  ?   Effort: Pulmonary effort is normal. No respiratory distress.  ?Skin: ?   Findings: No rash.  ?Neurological:  ?   General: No focal deficit present.  ?   Mental Status: He is alert and oriented to person, place, and time. Mental status is at baseline.  ?Psychiatric:     ?   Mood and Affect: Mood normal.     ?   Behavior: Behavior normal.     ?   Thought Content: Thought content normal.     ?   Judgment: Judgment normal.  ? ? ?Procedures  ?Procedures ? ?ED Course / MDM  ?  ?Medical Decision Making ?Amount and/or Complexity of Data Reviewed ?Labs: ordered. ?Radiology: ordered. ? ?Patient received cardiac work-up. ?CMP and CBC within normal limits. ?Regarding his lightheadedness and diaphoresis, glucose is normal.  His magnesium is also normal. ?BNP is 136, likely noncontributory. ?Initial troponin 53, repeat 50, delta -3.  He is chest pain-free.  There is no EKG changes.  Doubt ACS at this time. ?Chest x-ray without pneumothorax, pulmonary edema, pneumonia. ?Per report, Dr. Stark Jock ordered CT head and neck to rule out CNS cause.  His CT head and C-spine are both normal. ? ?There is question of whether he is having episodes of A-fib with RVR at home causing him to  become lightheaded and diaphoretic.  He has had no episodes of A-fib with RVR here in the emergency department.  He has been clinically stable throughout his stay.  He is currently hemodynamically stable, alert and oriented.  Feel that it is safe for him to be discharged home with cardiology follow-up.  He should call Dr. Bettina Gavia today who is his cardiologist to set up a follow-up appointment.  Given return precautions for worsening symptoms. ? ? ? ? ? ?  ?Mickie Hillier, PA-C ?11/29/21 0809 ? ?  ?Regan Lemming, MD ?11/29/21 1331 ? ?

## 2021-11-29 NOTE — ED Triage Notes (Signed)
Pt arrives to ED BIB EMS c/o dizziness x1 month. Per EMS pt went to the bathroom this morning and felt dizzy. Denies any falls or other symptoms. ? ?BP 150/80 ?HR 54 ?O2 97% RA ?

## 2021-11-29 NOTE — Discharge Instructions (Addendum)
You were seen in the emergency department today for lightheadedness and sweating.  Your work-up here in the emergency department has been reassuring.  We feel it is reasonable for you to follow-up with your cardiologist, Dr. Bettina Gavia.  Please give him a call today to set up a follow-up appointment.  Please continue to take your medications as prescribed.  Please return to the emergency department for any worsening symptoms or concerns. ?

## 2021-11-29 NOTE — Telephone Encounter (Signed)
Pt was seen at [facility] ED this morning and they told the pt they felt this maybe muscular. Pt wanted me to let you know. ? ?Advised that the monitor has been shipped per the preventice registration.  ?

## 2021-12-01 ENCOUNTER — Ambulatory Visit (INDEPENDENT_AMBULATORY_CARE_PROVIDER_SITE_OTHER): Payer: Medicare Other

## 2021-12-01 ENCOUNTER — Telehealth: Payer: Self-pay | Admitting: Cardiology

## 2021-12-01 DIAGNOSIS — R55 Syncope and collapse: Secondary | ICD-10-CM

## 2021-12-01 NOTE — Telephone Encounter (Signed)
Patient received a heart monitor in the mail and would like some assistance putting it on. Monitor visit scheduled for 2 pm today. ?

## 2021-12-01 NOTE — Telephone Encounter (Signed)
Patient got his heart monitor in the mail and  wanted to know if someone from the office could put it on for him ?

## 2021-12-01 NOTE — Progress Notes (Signed)
? ?Acute Office Visit ? ?Subjective:  ? ? Patient ID: John Kemp, male    DOB: 27-May-1946, 77 y.o.   MRN: [id number] ? ?Chief Complaint  ?Patient presents with  ? Requesting Physical Therapy  ? ?HPI ?Patient is in today for follow up for 11/29/2021 Emergency department. Had a  similar episode as mid March which took him to the ED then. Patient developed intermittent lightheadedness and diaphoresis. Felt weak. Lasted for few hours. No chest pain No shortness of breath. Complaining of neck pain. He came home took tylenol and has felt better. Holtor monitor applied 1 day after ED visit. Had been ordered in mid March after first episode and ED visit to [facility].  ?Got up to go to the bathroom 4 times last night. Patient is unmotivated to exercise. Patient's wife is concerned because her husband doesn't exercise. He is not active at all.  ? ?Past Medical History:  ?Diagnosis Date  ? A-fib ([place])   ? Acquired thrombophilia ([place]) 11/17/2019  ? Acute bronchitis due to other specified organisms 10/28/2021  ? Acute cough 10/20/2021  ? Acute non-recurrent maxillary sinusitis 10/28/2021  ? Anticoagulated 01/21/2015  ? Atherosclerotic heart disease of native coronary artery without angina pectoris   ? Atrial fibrillation with rapid ventricular response ([place]) 11/12/2021  ? Atrial flutter with rapid ventricular response ([place]) 11/12/2021  ? CAD in native artery 01/21/2015  ? Cancer [facility])   ? skin cancer  ? CHF (congestive heart failure) ([place]) 11/05/2019  ? Chronic diastolic (congestive) heart failure (HCC)   ? Chronic diastolic heart failure ([place]) 01/21/2015  ? Last Assessment & Plan:  Is congestive heart failure is mildly decompensated he is edematous he'll increase the dose of his diuretic today the lab work performed including an ARB referred to care transitions for home care he'll follow-up in my office with me in 4-6 weeks daily weights sodium restrictionand compliance of medications beinghemoglobin of will  ? Chronic  obstructive pulmonary disease (COPD) ([place])   ? Coronary artery disease involving autologous artery coronary bypass graft 11/12/2021  ? 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 11/17/2019  ? Dizziness 11/12/2021  ? Dyslipidemia 11/12/2021  ? Elevated brain natriuretic peptide (BNP) level 11/12/2021  ? Essential hypertension 01/21/2015  ? right superior quadrantanopsia  ? Hearing loss of right ear due to cerumen impaction 10/20/2021  ? High cholesterol 10/20/2017  ? Hx of CABG 03/02/2017  ? Hypertension   ? Hypertensive heart disease with heart failure ([place])   ? Hypothyroidism (acquired) 04/24/2018  ? Impaired fasting glucose   ? Left rotator cuff tear 11/12/2021  ? Mixed hyperlipidemia 11/17/2019  ? Non-seasonal allergic rhinitis due to pollen 10/20/2021  ? On amiodarone therapy 01/21/2015  ? PAF (paroxysmal atrial fibrillation) ([place])   ? Primary insomnia   ? [place] of cardioembolic stroke 9/38/1017  ? Severe obesity with body mass index (BMI) of 36.0 to 36.9 with serious comorbidity ([place])   ? Squamous cell carcinoma 11/05/2019  ? Typical atrial flutter ([place]) 01/21/2015  ? Vitamin D deficiency, unspecified   ? ? ?Past Surgical History:  ?Procedure Laterality Date  ? APPENDECTOMY  1956  ? CARDIAC CATHETERIZATION    ? CARDIOVERSION  2002  ? CHOLECYSTECTOMY  2006  ? CORONARY ARTERY BYPASS GRAFT  2001  ? HERNIA REPAIR    ? RADIOLOGY WITH ANESTHESIA Bilateral 01/18/2018  ? Procedure: MRI WITH ANESTHESIA LUMBAR SPINE WITH AND WITHOUT CONTRAST;  Surgeon: Radiologist, Medication, MD;  Location: [facility];  Service: Radiology;  Laterality: Bilateral;  ? ROTATOR CUFF REPAIR Left   ? TONSILLECTOMY  1975  ? ? ?Family History  ?Problem Relation Age of Onset  ? Heart disease Father   ? Arrhythmia Father   ? Breast cancer Mother   ? Diabetes type II Sister   ? Diabetes Brother   ? ? ?Social History  ? ?Socioeconomic History  ? Marital status: Married  ?  Spouse name: Not on file  ? Number of children: Not on file  ? Years of  education: Not on file  ? Highest education level: Not on file  ?Occupational History  ? Occupation: Retired  ?Tobacco Use  ? Smoking status: Former  ?  Packs/day: 1.00  ?  Years: 15.00  ?  Pack years: 15.00  ?  Types: Cigarettes  ?  Quit date: 08/29/1978  ?  Years since quitting: 43.3  ? Smokeless tobacco: Never  ?Vaping Use  ? Vaping Use: Never used  ?Substance and Sexual Activity  ? Alcohol use: No  ?  Alcohol/week: 0.0 standard drinks  ? Drug use: No  ? Sexual activity: Not Currently  ?Other Topics Concern  ? Not on file  ?Social History Narrative  ? Not on file  ? ?Social Determinants of Health  ? ?Financial Resource Strain: High Risk  ? Difficulty of Paying Living Expenses: Very hard  ?Food Insecurity: No Food Insecurity  ? Worried About Charity fundraiser in the Last Year: Never true  ? Ran Out of Food in the Last Year: Never true  ?Transportation Needs: Not on file  ?Physical Activity: Not on file  ?Stress: Not on file  ?Social Connections: Not on file  ?Intimate Partner Violence: Not on file  ? ? ?Outpatient Medications Prior to Visit  ?Medication Sig Dispense Refill  ? amiodarone (PACERONE) 200 MG tablet TAKE 1 TABLET BY MOUTH EVERY DAY (Patient taking differently: Take 200 mg by mouth daily.) 90 tablet 2  ? amLODipine (NORVASC) 5 MG tablet Take 1 tablet (5 mg total) by mouth daily. 90 tablet 1  ? atorvastatin (LIPITOR) 10 MG tablet TAKE 1 TABLET BY MOUTH EVERYDAY AT BEDTIME (Patient taking differently: Take 10 mg by mouth at bedtime.) 90 tablet 1  ? donepezil (ARICEPT) 10 MG tablet TAKE 1 TABLET BY MOUTH EVERYDAY AT BEDTIME (Patient taking differently: Take 10 mg by mouth at bedtime.) 90 tablet 1  ? ELIQUIS 5 MG TABS tablet TAKE 1 TABLET BY MOUTH TWICE A DAY (Patient taking differently: Take 5 mg by mouth 2 (two) times daily.) 180 tablet 1  ? fluticasone (FLONASE) 50 MCG/ACT nasal spray Place 2 sprays into both nostrils daily. 16 g 6  ? furosemide (LASIX) 40 MG tablet Take 40 mg by mouth daily as needed  for fluid or edema.    ? Inulin (FIBER CHOICE PO) Take 3 tablets by mouth daily.    ? levothyroxine (SYNTHROID) 137 MCG tablet Take 1 tablet (137 mcg total) by mouth daily before breakfast. 90 tablet 3  ? montelukast (SINGULAIR) 10 MG tablet Take 1 tablet (10 mg total) by mouth at bedtime. (Patient taking differently: Take 10 mg by mouth at bedtime as needed (allergies).) 30 tablet 5  ? nitroGLYCERIN (NITROSTAT) 0.4 MG SL tablet Place 0.4 mg under the tongue every 5 (five) minutes as needed for chest pain.    ? ondansetron (ZOFRAN) 4 MG tablet Take 1 tablet (4 mg total) by mouth every 8 (eight) hours as needed for nausea or vomiting. 30 tablet 0  ?  potassium chloride (KLOR-CON M) 10 MEQ tablet Take 10 mEq by mouth daily as needed (take with furosemide).    ? tamsulosin (FLOMAX) 0.4 MG CAPS capsule TAKE 1 CAPSULE BY MOUTH EVERY DAY (Patient taking differently: Take 0.4 mg by mouth daily after supper.) 90 capsule 1  ? valsartan (DIOVAN) 320 MG tablet TAKE ONE TABLET BY MOUTH DAILY (Patient taking differently: Take 320 mg by mouth daily.) 90 tablet 3  ? zolpidem (AMBIEN) 10 MG tablet Take 1 tablet (10 mg total) by mouth at bedtime as needed for sleep. (Patient taking differently: Take 10 mg by mouth at bedtime.) 90 tablet 1  ? ?No facility-administered medications prior to visit.  ? ? ?Allergies  ?Allergen Reactions  ? Avelox [Moxifloxacin Hcl]   ? Cartia Xt [Diltiazem Hcl] Nausea And Vomiting  ? Pravachol [Pravastatin Sodium] Other (See Comments)  ?  myalgia  ? Prednisone Other (See Comments)  ?  "makes me feel terrible"  ? Zetia [Ezetimibe] Other (See Comments)  ?  myalgias  ? ? ?Review of Systems  ?Constitutional:  Negative for appetite change, fatigue and fever.  ?HENT:  Negative for congestion, ear pain, sinus pressure and sore throat.   ?Respiratory:  Negative for cough, chest tightness, shortness of breath and wheezing.   ?Cardiovascular:  Negative for chest pain and palpitations.  ?Gastrointestinal:  Negative  for abdominal pain, constipation, diarrhea, nausea and vomiting.  ?Genitourinary:  Negative for dysuria and hematuria.  ?Musculoskeletal:  Negative for arthralgias, back pain, joint swelling and myalgias.  ?Skin:  Ne

## 2021-12-02 ENCOUNTER — Encounter: Payer: Self-pay | Admitting: Family Medicine

## 2021-12-02 ENCOUNTER — Telehealth: Payer: Self-pay | Admitting: *Deleted

## 2021-12-02 ENCOUNTER — Ambulatory Visit (INDEPENDENT_AMBULATORY_CARE_PROVIDER_SITE_OTHER): Payer: Medicare Other | Admitting: Family Medicine

## 2021-12-02 ENCOUNTER — Telehealth: Payer: Self-pay

## 2021-12-02 VITALS — BP 122/68 | HR 77 | Temp 97.9°F | Resp 18 | Ht 70.0 in | Wt 223.6 lb

## 2021-12-02 DIAGNOSIS — R5381 Other malaise: Secondary | ICD-10-CM | POA: Diagnosis not present

## 2021-12-02 DIAGNOSIS — R42 Dizziness and giddiness: Secondary | ICD-10-CM | POA: Diagnosis not present

## 2021-12-02 DIAGNOSIS — I4819 Other persistent atrial fibrillation: Secondary | ICD-10-CM | POA: Diagnosis not present

## 2021-12-02 NOTE — Telephone Encounter (Signed)
Patient questioning if he should be referred to physical therapy or cardiac therapy.  ? ?Provider advises cardiac rehab would be best for patient. Patient advised and he VU. He will await appointment. Dr [facility] office should be contacting him again this afternoon or in AM.  ? ?John Kemp 12/02/21 4:17 PM ? ?

## 2021-12-02 NOTE — Telephone Encounter (Signed)
Spoke with John Kemp and the heart monitor is not a problem. He feels he needs physical therapy but Dr. Tobie Poet is not comfortable with it due to heart enzymes. Got out of [facility] this Monday morning. Nothing was found. John Kemp stated he was unable to walk due to fatigue. Wondering if his issue is muscular and would cardiac rehab be beneficial. Please advise ?

## 2021-12-03 NOTE — Telephone Encounter (Signed)
Spoke with pt this am. Informed him that Dr. Bettina Gavia feels he should continue wearing the 30 day event monitor and lets see how that comes out before we start doing other things such as physical therapy. This monitor is live and we can see if there is anything serious with pt's heart and can treat as needed. Pt verbalized understanding and will continue to wear monitor. ?

## 2021-12-07 DIAGNOSIS — R5381 Other malaise: Secondary | ICD-10-CM

## 2021-12-07 HISTORY — DX: Other malaise: R53.81

## 2021-12-07 NOTE — Assessment & Plan Note (Signed)
Recommend cardiopulmonary rehab. I discussed with cardiology and it is better to wait until event monitor completed. ?

## 2021-12-07 NOTE — Assessment & Plan Note (Addendum)
Continue event monitor.  ?

## 2021-12-07 NOTE — Assessment & Plan Note (Signed)
Continue event monitor.  ?

## 2021-12-20 ENCOUNTER — Telehealth: Payer: Self-pay

## 2021-12-20 NOTE — Chronic Care Management (AMB) (Signed)
? ? ?Chronic Care Management ?Pharmacy Assistant  ? ?Name: John Kemp  MRN: [id number] DOB: 09/21/1945 ? ?Reason for Encounter: Disease State/ Hypertension ? ?Recent office visits:  ?12-02-2021 [name], MD. No changes. ? ?11-16-2021 [name], MD. START flonase 2 sprays into both nostrils daily and Singulair 10 mg at bedtime. ? ?Recent consult visits:  ?11-15-2021 [name], MD (Cardiology). Patient reported not taking augmentin, vitamin D, hydromet and singulair. EKG completed. Order placed for cardiac event monitor. ? ?Hospital visits:  ?Medication Reconciliation was completed by comparing discharge summary, patient?s EMR and Pharmacy list, and upon discussion with patient. ? ?Admitted to the hospital on 11-29-2021 due to lightheadedness. Discharge date was 11-29-2021. Discharged from [facility].   ? ?New?Medications Started at [facility] Discharge:?? ?None ? ?Medication Changes at Hospital Discharge: ?None ? ?Medications Discontinued at Hospital Discharge: ?None ? ?Medications that remain the same after Hospital Discharge:??  ?-All other medications will remain the same.   ? ?Medications: ?Outpatient Encounter Medications as of 12/20/2021  ?Medication Sig  ? amiodarone (PACERONE) 200 MG tablet TAKE 1 TABLET BY MOUTH EVERY DAY (Patient taking differently: Take 200 mg by mouth daily.)  ? amLODipine (NORVASC) 5 MG tablet Take 1 tablet (5 mg total) by mouth daily.  ? atorvastatin (LIPITOR) 10 MG tablet TAKE 1 TABLET BY MOUTH EVERYDAY AT BEDTIME (Patient taking differently: Take 10 mg by mouth at bedtime.)  ? donepezil (ARICEPT) 10 MG tablet TAKE 1 TABLET BY MOUTH EVERYDAY AT BEDTIME (Patient taking differently: Take 10 mg by mouth at bedtime.)  ? ELIQUIS 5 MG TABS tablet TAKE 1 TABLET BY MOUTH TWICE A DAY (Patient taking differently: Take 5 mg by mouth 2 (two) times daily.)  ? fluticasone (FLONASE) 50 MCG/ACT nasal spray Place 2 sprays into both nostrils daily.  ? furosemide (LASIX) 40 MG  tablet Take 40 mg by mouth daily as needed for fluid or edema.  ? Inulin (FIBER CHOICE PO) Take 3 tablets by mouth daily.  ? levothyroxine (SYNTHROID) 137 MCG tablet Take 1 tablet (137 mcg total) by mouth daily before breakfast.  ? montelukast (SINGULAIR) 10 MG tablet Take 1 tablet (10 mg total) by mouth at bedtime. (Patient taking differently: Take 10 mg by mouth at bedtime as needed (allergies).)  ? nitroGLYCERIN (NITROSTAT) 0.4 MG SL tablet Place 0.4 mg under the tongue every 5 (five) minutes as needed for chest pain.  ? ondansetron (ZOFRAN) 4 MG tablet Take 1 tablet (4 mg total) by mouth every 8 (eight) hours as needed for nausea or vomiting.  ? potassium chloride (KLOR-CON M) 10 MEQ tablet Take 10 mEq by mouth daily as needed (take with furosemide).  ? tamsulosin (FLOMAX) 0.4 MG CAPS capsule TAKE 1 CAPSULE BY MOUTH EVERY DAY (Patient taking differently: Take 0.4 mg by mouth daily after supper.)  ? valsartan (DIOVAN) 320 MG tablet TAKE ONE TABLET BY MOUTH DAILY (Patient taking differently: Take 320 mg by mouth daily.)  ? zolpidem (AMBIEN) 10 MG tablet Take 1 tablet (10 mg total) by mouth at bedtime as needed for sleep. (Patient taking differently: Take 10 mg by mouth at bedtime.)  ? ?No facility-administered encounter medications on file as of 12/20/2021.  ? ?Recent Office Vitals: ?BP Readings from Last 3 Encounters:  ?12/02/21 122/68  ?11/29/21 140/78  ?11/22/21 118/62  ? ?Pulse Readings from Last 3 Encounters:  ?12/02/21 77  ?11/29/21 (!) 50  ?11/22/21 60  ?  ?Wt Readings from Last 3 Encounters:  ?12/02/21 223 lb 9.6 oz (  101.4 kg)  ?11/22/21 223 lb (101.2 kg)  ?11/15/21 226 lb 6.4 oz (102.7 kg)  ?  ? ?Kidney Function ?Lab Results  ?Component Value Date/Time  ? CREATININE 1.12 11/29/2021 05:03 AM  ? CREATININE 1.15 11/01/2021 11:15 AM  ? GFRNONAA >60 11/29/2021 05:03 AM  ? GFRAA 54 (L) 09/21/2020 10:05 AM  ? ? ? ?  Latest Ref Rng & Units 11/29/2021  ?  5:03 AM 11/01/2021  ? 11:15 AM 04/23/2021  ?  9:53 AM  ?BMP   ?Glucose 70 - 99 mg/dL 104   84   94    ?BUN 8 - 23 mg/dL '16   14   15    '$ ?Creatinine 0.61 - 1.24 mg/dL 1.12   1.15   1.26    ?BUN/Creat Ratio 10 - '24  12   12    '$ ?Sodium 135 - 145 mmol/L 134   142   141    ?Potassium 3.5 - 5.1 mmol/L 3.5   4.2   3.8    ?Chloride 98 - 111 mmol/L 103   103   102    ?CO2 22 - 32 mmol/L '23   27   24    '$ ?Calcium 8.9 - 10.3 mg/dL 8.6   8.9   9.3    ? ? ? ?Current antihypertensive regimen:  ?Amlodipine 5 mg bid ?Furosemide 40 mg twice weekly ?Valsartan 320 mg daily ?Potassium 10 meq daily prn  ? Patient verbally confirms he is taking the above medications as directed. Yes ? ?How often are you checking your Blood Pressure? 1-2x per week ? ?he checks his blood pressure before taking medications and some days after taking medications. ? ?Current home BP readings: 130/80 64, 132/70 62, 122/68 77 ?Wrist or arm cuff: Both ?Caffeine intake: Drinks half a can of diet pepsi weekly now ?Salt intake: Limited ?OTC medications including pseudoephedrine or NSAIDs? None ? ?Any readings above 180/120? No ? ?What recent interventions/DTPs have been made by any provider to improve Blood Pressure control since last CPP Visit:  ?Educated on BP goals and benefits of medications for prevention of heart attack, stroke and kidney damage; ?Daily salt intake goal < 2300 mg; ?Exercise goal of 150 minutes per week; ?Importance of home blood pressure monitoring; ?-Counseled to monitor BP at home weekly, document, and provide log at future appointments ?-Counseled on diet and exercise extensively ? ?Any recent hospitalizations or ED visits since last visit with CPP? Yes ? ?What diet changes have been made to improve Blood Pressure Control?  ?Patient states he has limited salt intake and drinks plenty of water daily. ? ?What exercise is being done to improve your Blood Pressure Control?  ?Patient states he doesn't move around much but does walk around the house and sits in his recliner mainly. ? ?Adherence Review: ?Is  the patient currently on ACE/ARB medication? Yes ?Does the patient have >5 day gap between last estimated fill dates? CPP to review ? ?Care Gaps: ?Last annual wellness visit? 08-26-2021 ?Hep C screening overdue ?Shingrix overdue ?3rd Moderna vaccine overdue last completed 12-18-2020 ? ?Star Rating Drugs: ?Valsartan 320 mg- Last filled 11-28-2021 90 DS ?Atorvastatin 10 mg- Last filled 11-11-2021 90 DS ? ?Malecca Hicks CMA ?Clinical Pharmacist Assistant ?[phone] ? ?

## 2021-12-24 ENCOUNTER — Ambulatory Visit (INDEPENDENT_AMBULATORY_CARE_PROVIDER_SITE_OTHER): Payer: Medicare Other | Admitting: Family Medicine

## 2021-12-24 ENCOUNTER — Encounter: Payer: Self-pay | Admitting: Family Medicine

## 2021-12-24 VITALS — BP 118/68 | HR 53 | Temp 97.2°F | Ht 70.0 in | Wt 226.0 lb

## 2021-12-24 DIAGNOSIS — E039 Hypothyroidism, unspecified: Secondary | ICD-10-CM | POA: Diagnosis not present

## 2021-12-24 DIAGNOSIS — R29898 Other symptoms and signs involving the musculoskeletal system: Secondary | ICD-10-CM | POA: Diagnosis not present

## 2021-12-24 DIAGNOSIS — R350 Frequency of micturition: Secondary | ICD-10-CM | POA: Diagnosis not present

## 2021-12-24 DIAGNOSIS — N401 Enlarged prostate with lower urinary tract symptoms: Secondary | ICD-10-CM | POA: Diagnosis not present

## 2021-12-24 HISTORY — DX: Benign prostatic hyperplasia with lower urinary tract symptoms: N40.1

## 2021-12-24 LAB — POCT URINALYSIS DIPSTICK
Bilirubin, UA: POSITIVE
Blood, UA: NEGATIVE
Glucose, UA: NEGATIVE
Ketones, UA: NEGATIVE
Leukocytes, UA: NEGATIVE
Nitrite, UA: NEGATIVE
Protein, UA: NEGATIVE
Spec Grav, UA: >=1.03 — AB (ref 1.010–1.025)
Urobilinogen, UA: NEGATIVE E.U./dL — AB
pH, UA: 5 (ref 5.0–8.0)

## 2021-12-24 MED ORDER — TAMSULOSIN HCL 0.4 MG PO CAPS: 0.8000 mg | ORAL_CAPSULE | Freq: Every day | ORAL | 1 refills | Status: DC

## 2021-12-24 NOTE — Progress Notes (Signed)
? ?Acute Office Visit ? ?Subjective:  ? ? Patient ID: John Kemp, male    DOB: 1945/09/27, 76 y.o.   MRN: [id number] ? ?Chief Complaint  ?Patient presents with  ? Frequent urination  ? ?HPI ?Patient is in today for frequent urination but very low volume. Has to go every 15-20 minutes. Patient has dribbling incontinence. Has been going on a least 2 months. ON flomax 0.4 mg once daily.  ?No dysuria. No hematuria.  ?Patient takes lasix 40 mg daily as needed, but has not taken any in a long time.  ? ?Patient continues to feel weak in his legs after walking for 10-20 minutes. Not vascular issue. He is concerned it is coming from his back although he does not really have back pain. Denies numbness in legs.  ? ?Past Medical History:  ?Diagnosis Date  ? A-fib ([place])   ? Acquired thrombophilia ([place]) 11/17/2019  ? Acute bronchitis due to other specified organisms 10/28/2021  ? Acute cough 10/20/2021  ? Acute non-recurrent maxillary sinusitis 10/28/2021  ? Anticoagulated 01/21/2015  ? Atherosclerotic heart disease of native coronary artery without angina pectoris   ? Atrial fibrillation with rapid ventricular response ([place]) 11/12/2021  ? Atrial flutter with rapid ventricular response ([place]) 11/12/2021  ? CAD in native artery 01/21/2015  ? Cancer [facility])   ? skin cancer  ? CHF (congestive heart failure) ([place]) 11/05/2019  ? Chronic diastolic (congestive) heart failure (HCC)   ? Chronic diastolic heart failure ([place]) 01/21/2015  ? Last Assessment & Plan:  Is congestive heart failure is mildly decompensated he is edematous he'll increase the dose of his diuretic today the lab work performed including an ARB referred to care transitions for home care he'll follow-up in my office with me in 4-6 weeks daily weights sodium restrictionand compliance of medications beinghemoglobin of will  ? Chronic obstructive pulmonary disease (COPD) ([place])   ? Coronary artery disease involving autologous artery coronary bypass graft 11/12/2021  ? Disturbances  of vision due to cerebrovascular disease 11/17/2019  ? Dizziness 11/12/2021  ? Dyslipidemia 11/12/2021  ? Elevated brain natriuretic peptide (BNP) level 11/12/2021  ? Essential hypertension 01/21/2015  ? right superior quadrantanopsia  ? Hearing loss of right ear due to cerumen impaction 10/20/2021  ? High cholesterol 10/20/2017  ? Hx of CABG 03/02/2017  ? Hypertension   ? Hypertensive heart disease with heart failure ([place])   ? Hypothyroidism (acquired) 04/24/2018  ? Impaired fasting glucose   ? Left rotator cuff tear 11/12/2021  ? Mixed hyperlipidemia 11/17/2019  ? Non-seasonal allergic rhinitis due to pollen 10/20/2021  ? On amiodarone therapy 01/21/2015  ? PAF (paroxysmal atrial fibrillation) ([place])   ? Primary insomnia   ? Sequela of cardioembolic stroke 8/41/3244  ? Severe obesity with body mass index (BMI) of 36.0 to 36.9 with serious comorbidity ([place])   ? Squamous cell carcinoma 11/05/2019  ? Typical atrial flutter ([place]) 01/21/2015  ? Vitamin D deficiency, unspecified   ? ? ?Past Surgical History:  ?Procedure Laterality Date  ? APPENDECTOMY  1956  ? CARDIAC CATHETERIZATION    ? CARDIOVERSION  2002  ? CHOLECYSTECTOMY  2006  ? CORONARY ARTERY BYPASS GRAFT  2001  ? HERNIA REPAIR    ? RADIOLOGY WITH ANESTHESIA Bilateral 01/18/2018  ? Procedure: MRI WITH ANESTHESIA LUMBAR SPINE WITH AND WITHOUT CONTRAST;  Surgeon: Radiologist, Medication, MD;  Location: [place];  Service: Radiology;  Laterality: Bilateral;  ? ROTATOR CUFF REPAIR Left   ? TONSILLECTOMY  1975  ? ? ?  Family History  ?Problem Relation Age of Onset  ? Heart disease Father   ? Arrhythmia Father   ? Breast cancer Mother   ? Diabetes type II Sister   ? Diabetes Brother   ? ? ?Social History  ? ?Socioeconomic History  ? Marital status: Married  ?  Spouse name: Not on file  ? Number of children: Not on file  ? Years of education: Not on file  ? Highest education level: Not on file  ?Occupational History  ? Occupation: Retired  ?Tobacco Use  ? Smoking status: Former  ?   Packs/day: 1.00  ?  Years: 15.00  ?  Pack years: 15.00  ?  Types: Cigarettes  ?  Quit date: 08/29/1978  ?  Years since quitting: 43.3  ? Smokeless tobacco: Never  ?Vaping Use  ? Vaping Use: Never used  ?Substance and Sexual Activity  ? Alcohol use: No  ?  Alcohol/week: 0.0 standard drinks  ? Drug use: No  ? Sexual activity: Not Currently  ?Other Topics Concern  ? Not on file  ?Social History Narrative  ? Not on file  ? ?Social Determinants of Health  ? ?Financial Resource Strain: High Risk  ? Difficulty of Paying Living Expenses: Very hard  ?Food Insecurity: Not on file  ?Transportation Needs: Not on file  ?Physical Activity: Not on file  ?Stress: Not on file  ?Social Connections: Not on file  ?Intimate Partner Violence: Not on file  ? ? ?Outpatient Medications Prior to Visit  ?Medication Sig Dispense Refill  ? amiodarone (PACERONE) 200 MG tablet TAKE 1 TABLET BY MOUTH EVERY DAY (Patient taking differently: Take 200 mg by mouth daily.) 90 tablet 2  ? amLODipine (NORVASC) 5 MG tablet Take 1 tablet (5 mg total) by mouth daily. 90 tablet 1  ? atorvastatin (LIPITOR) 10 MG tablet TAKE 1 TABLET BY MOUTH EVERYDAY AT BEDTIME (Patient taking differently: Take 10 mg by mouth at bedtime.) 90 tablet 1  ? donepezil (ARICEPT) 10 MG tablet TAKE 1 TABLET BY MOUTH EVERYDAY AT BEDTIME (Patient taking differently: Take 10 mg by mouth at bedtime.) 90 tablet 1  ? ELIQUIS 5 MG TABS tablet TAKE 1 TABLET BY MOUTH TWICE A DAY (Patient taking differently: Take 5 mg by mouth 2 (two) times daily.) 180 tablet 1  ? fluticasone (FLONASE) 50 MCG/ACT nasal spray Place 2 sprays into both nostrils daily. 16 g 6  ? furosemide (LASIX) 40 MG tablet Take 40 mg by mouth daily as needed for fluid or edema.    ? Inulin (FIBER CHOICE PO) Take 3 tablets by mouth daily.    ? levothyroxine (SYNTHROID) 137 MCG tablet Take 1 tablet (137 mcg total) by mouth daily before breakfast. 90 tablet 3  ? montelukast (SINGULAIR) 10 MG tablet Take 1 tablet (10 mg total) by  mouth at bedtime. (Patient taking differently: Take 10 mg by mouth at bedtime as needed (allergies).) 30 tablet 5  ? nitroGLYCERIN (NITROSTAT) 0.4 MG SL tablet Place 0.4 mg under the tongue every 5 (five) minutes as needed for chest pain.    ? ondansetron (ZOFRAN) 4 MG tablet Take 1 tablet (4 mg total) by mouth every 8 (eight) hours as needed for nausea or vomiting. 30 tablet 0  ? potassium chloride (KLOR-CON M) 10 MEQ tablet Take 10 mEq by mouth daily as needed (take with furosemide).    ? valsartan (DIOVAN) 320 MG tablet TAKE ONE TABLET BY MOUTH DAILY (Patient taking differently: Take 320 mg by mouth daily.)  90 tablet 3  ? zolpidem (AMBIEN) 10 MG tablet Take 1 tablet (10 mg total) by mouth at bedtime as needed for sleep. (Patient taking differently: Take 10 mg by mouth at bedtime.) 90 tablet 1  ? tamsulosin (FLOMAX) 0.4 MG CAPS capsule TAKE 1 CAPSULE BY MOUTH EVERY DAY (Patient taking differently: Take 0.4 mg by mouth daily after supper.) 90 capsule 1  ? ?No facility-administered medications prior to visit.  ? ? ?Allergies  ?Allergen Reactions  ? Avelox [Moxifloxacin Hcl]   ? Cartia Xt [Diltiazem Hcl] Nausea And Vomiting  ? Pravachol [Pravastatin Sodium] Other (See Comments)  ?  myalgia  ? Prednisone Other (See Comments)  ?  "makes me feel terrible"  ? Zetia [Ezetimibe] Other (See Comments)  ?  myalgias  ? ? ?Review of Systems  ?Constitutional:  Negative for chills, fatigue and fever.  ?HENT:  Negative for congestion, ear pain and sore throat.   ?Respiratory:  Negative for cough and shortness of breath.   ?Cardiovascular:  Negative for chest pain.  ?Gastrointestinal:  Negative for abdominal pain, constipation, diarrhea, nausea and vomiting.  ?Genitourinary:  Positive for decreased urine volume, frequency and urgency. Negative for difficulty urinating and dysuria.  ?Musculoskeletal:  Negative for arthralgias and myalgias.  ?Neurological:  Positive for weakness (leg weakness.). Negative for dizziness and headaches.   ?Psychiatric/Behavioral:  Negative for dysphoric mood.   ?     No dysphoria  ? ?   ?Objective:  ?  ?Physical Exam ?Vitals reviewed.  ?Constitutional:   ?   Appearance: Normal appearance. He is obese.  ?

## 2021-12-24 NOTE — Telephone Encounter (Signed)
PCP wanted me to look into allergy interaction between Moxifloxacin and Finasteride. Coordinated with [facility]ists who looked on Lexicomp and Micromedex, no allergy interaction showed up. Let PCP know ?

## 2021-12-25 LAB — TSH: TSH: 1.08 u[IU]/mL (ref 0.450–4.500)

## 2021-12-25 LAB — PSA: Prostate Specific Ag, Serum: 0.8 ng/mL (ref 0.0–4.0)

## 2021-12-26 LAB — T4, FREE: Free T4: 2.35 ng/dL — ABNORMAL HIGH (ref 0.82–1.77)

## 2021-12-26 NOTE — Assessment & Plan Note (Signed)
Check tsh 

## 2021-12-26 NOTE — Assessment & Plan Note (Signed)
Recommend cardiopulmonary rehab after holtor monitor completed and reviewed by cardiology.  ?

## 2021-12-26 NOTE — Assessment & Plan Note (Addendum)
UA normal.  ?Check psa.  ?Increase flomax to 0.4 mg 2 daily.  ?Add finasteride 5 mg daily  ?

## 2021-12-27 ENCOUNTER — Other Ambulatory Visit: Payer: Self-pay | Admitting: Family Medicine

## 2021-12-28 ENCOUNTER — Other Ambulatory Visit: Payer: Self-pay

## 2021-12-28 DIAGNOSIS — E039 Hypothyroidism, unspecified: Secondary | ICD-10-CM

## 2021-12-28 MED ORDER — LEVOTHYROXINE SODIUM 112 MCG PO TABS: 112.0000 ug | ORAL_TABLET | Freq: Every day | ORAL | 0 refills | Status: DC

## 2021-12-31 ENCOUNTER — Other Ambulatory Visit: Payer: Self-pay

## 2021-12-31 MED ORDER — FINASTERIDE 5 MG PO TABS: 5.0000 mg | ORAL_TABLET | Freq: Every day | ORAL | 1 refills | Status: AC

## 2022-01-04 DIAGNOSIS — L570 Actinic keratosis: Secondary | ICD-10-CM | POA: Diagnosis not present

## 2022-01-04 DIAGNOSIS — L821 Other seborrheic keratosis: Secondary | ICD-10-CM | POA: Diagnosis not present

## 2022-01-04 DIAGNOSIS — L578 Other skin changes due to chronic exposure to nonionizing radiation: Secondary | ICD-10-CM | POA: Diagnosis not present

## 2022-01-04 DIAGNOSIS — D0422 Carcinoma in situ of skin of left ear and external auricular canal: Secondary | ICD-10-CM | POA: Diagnosis not present

## 2022-01-08 NOTE — Progress Notes (Signed)
?Cardiology Office Note:   ? ?Date:  01/10/2022  ? ?ID:  John Kemp, DOB Sep 03, 1945, MRN [id number] ? ?PCP:  [name], MD  ?Cardiologist:  Shirlee More, MD   ? ?Referring MD: [name], MD  ? ? ?ASSESSMENT:   ? ?1. SSS (sick sinus syndrome) ([place])   ?2. Sinus bradycardia   ?3. PAF (paroxysmal atrial fibrillation) ([place])   ?4. On amiodarone therapy   ?5. Anticoagulated   ?6. Hypertensive heart disease with chronic diastolic congestive heart failure ([place])   ? ?PLAN:   ? ?In order of problems listed above: ? ?Fortunately does not require pacemaker he has mild sinus bradycardia and I think he can continue his Aricept. ?Maintaining sinus rhythm continue low-dose amiodarone and anticoagulant recent thyroid TSH normal ?Heart failure is compensated continue his current diuretic and antihypertensives including ARB ?I have asked him to reduce his Ambien by one half and may be contributing to his vague symptoms ? ? ?Next appointment: 6 months ? ? ?Medication Adjustments/Labs and Tests Ordered: ?Current medicines are reviewed at length with the patient today.  Concerns regarding medicines are outlined above.  ?No orders of the defined types were placed in this encounter. ? ?No orders of the defined types were placed in this encounter. ? ? ?Chief Complaint  ?Patient presents with  ? Follow-up  ? Bradycardia  ?  And SSS  ? Atrial Fibrillation  ?  On amiodarone ?  ? ? ?History of Present Illness:   ? ?John Kemp is a 76 y.o. male with a hx of paroxysmal atrial fibrillation and flutter maintaining sinus rhythm on amiodarone, chronic anticoagulation, CAD hypertension with heart failure bilateral less than 50% ICA stenosis on carotid duplex and hyperlipidemia last seen 11/01/2021 with concern of bradycardia. ? ?He has had several ED visits [facility] as well as [facility].  On 11/09/2021 at [facility] health he was in sinus bradycardia with nonspecific conduction delay had a wide-complex rhythm that was  interpreted as atrial fibrillation is more consistent with rate related bundle branch block or accelerated idioventricular rhythm. ? ?Compliance with diet, lifestyle and medications: Yes ? ?He is here with his wife from a cardiology perspective he is stable he is not having syncope or near syncope ?His predominant problem is unsteady gait neck pain shoulder pain he tells me at times he feels as if he is intoxicated and he takes Ambien every evening. ?He has no edema shortness of breath chest pain. ? ?Event monitor 01/05/2022: ?Study Highlights ?30-day event monitor was performed 12/01/2021 to 12/30/2021. ?Rhythm throughout with sinus sinus bradycardia was noted to minimum rate of 40 bpm. ?Are no pauses of 3 seconds or greater there were no episodes of second or third-degree AV block.  There are no episodes of complex ventricular arrhythmia atrial fibrillation or flutter. ?There were no symptomatic events. ? ?Component Ref Range & Units 1 mo ago ?(11/29/21) 1 mo ago ?(11/29/21)  ?Troponin I (High Sensitivity) <18 ng/L 50 High   53 High  CM   ? ?Past Medical History:  ?Diagnosis Date  ? A-fib ([place])   ? Acquired thrombophilia ([place]) 11/17/2019  ? Acute bronchitis due to other specified organisms 10/28/2021  ? Acute cough 10/20/2021  ? Acute non-recurrent maxillary sinusitis 10/28/2021  ? Anticoagulated 01/21/2015  ? Atherosclerotic heart disease of native coronary artery without angina pectoris   ? Atrial fibrillation with rapid ventricular response ([place]) 11/12/2021  ? Atrial flutter with rapid ventricular response ([place]) 11/12/2021  ? CAD  in native artery 01/21/2015  ? Cancer [facility])   ? skin cancer  ? CHF (congestive heart failure) ([place]) 11/05/2019  ? Chronic diastolic (congestive) heart failure (HCC)   ? Chronic diastolic heart failure ([place]) 01/21/2015  ? Last Assessment & Plan:  Is congestive heart failure is mildly decompensated he is edematous he'll increase the dose of his diuretic today the lab work performed including an ARB  referred to care transitions for home care he'll follow-up in my office with me in 4-6 weeks daily weights sodium restrictionand compliance of medications beinghemoglobin of will  ? Chronic obstructive pulmonary disease (COPD) ([place])   ? Coronary artery disease involving autologous artery coronary bypass graft 11/12/2021  ? 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 11/17/2019  ? Dizziness 11/12/2021  ? Dyslipidemia 11/12/2021  ? Elevated brain natriuretic peptide (BNP) level 11/12/2021  ? Essential hypertension 01/21/2015  ? right superior quadrantanopsia  ? Hearing loss of right ear due to cerumen impaction 10/20/2021  ? High cholesterol 10/20/2017  ? Hx of CABG 03/02/2017  ? Hypertension   ? Hypertensive heart disease with heart failure ([place])   ? Hypothyroidism (acquired) 04/24/2018  ? Impaired fasting glucose   ? Left rotator cuff tear 11/12/2021  ? Mixed hyperlipidemia 11/17/2019  ? Non-seasonal allergic rhinitis due to pollen 10/20/2021  ? On amiodarone therapy 01/21/2015  ? PAF (paroxysmal atrial fibrillation) ([place])   ? Primary insomnia   ? Sequela of cardioembolic stroke 9/79/8921  ? Severe obesity with body mass index (BMI) of 36.0 to 36.9 with serious comorbidity ([place])   ? Squamous cell carcinoma 11/05/2019  ? Typical atrial flutter ([place]) 01/21/2015  ? Vitamin D deficiency, unspecified   ? ? ?Past Surgical History:  ?Procedure Laterality Date  ? APPENDECTOMY  1956  ? CARDIAC CATHETERIZATION    ? CARDIOVERSION  2002  ? CHOLECYSTECTOMY  2006  ? CORONARY ARTERY BYPASS GRAFT  2001  ? HERNIA REPAIR    ? RADIOLOGY WITH ANESTHESIA Bilateral 01/18/2018  ? Procedure: MRI WITH ANESTHESIA LUMBAR SPINE WITH AND WITHOUT CONTRAST;  Surgeon: Radiologist, Medication, MD;  Location: [place];  Service: Radiology;  Laterality: Bilateral;  ? ROTATOR CUFF REPAIR Left   ? TONSILLECTOMY  1975  ? ? ?Current Medications: ?Current Meds  ?Medication Sig  ? amiodarone (PACERONE) 200 MG tablet TAKE 1 TABLET BY MOUTH EVERY DAY (Patient  taking differently: Take 200 mg by mouth daily.)  ? amLODipine (NORVASC) 5 MG tablet Take 1 tablet (5 mg total) by mouth daily.  ? atorvastatin (LIPITOR) 10 MG tablet TAKE 1 TABLET BY MOUTH EVERYDAY AT BEDTIME (Patient taking differently: Take 10 mg by mouth at bedtime.)  ? donepezil (ARICEPT) 10 MG tablet TAKE 1 TABLET BY MOUTH EVERYDAY AT BEDTIME (Patient taking differently: Take 10 mg by mouth at bedtime.)  ? ELIQUIS 5 MG TABS tablet TAKE 1 TABLET BY MOUTH TWICE A DAY (Patient taking differently: Take 5 mg by mouth 2 (two) times daily.)  ? finasteride (PROSCAR) 5 MG tablet Take 1 tablet (5 mg total) by mouth daily.  ? fluticasone (FLONASE) 50 MCG/ACT nasal spray Place 2 sprays into both nostrils daily.  ? furosemide (LASIX) 40 MG tablet Take 40 mg by mouth daily as needed for fluid or edema.  ? Inulin (FIBER CHOICE PO) Take 3 tablets by mouth daily.  ? levothyroxine (SYNTHROID) 112 MCG tablet Take 1 tablet (112 mcg total) by mouth daily before breakfast.  ? montelukast (SINGULAIR) 10 MG tablet Take 1 tablet (10 mg total)  by mouth at bedtime. (Patient taking differently: Take 10 mg by mouth at bedtime as needed (allergies).)  ? nitroGLYCERIN (NITROSTAT) 0.4 MG SL tablet Place 0.4 mg under the tongue every 5 (five) minutes as needed for chest pain.  ? ondansetron (ZOFRAN) 4 MG tablet Take 1 tablet (4 mg total) by mouth every 8 (eight) hours as needed for nausea or vomiting.  ? potassium chloride (KLOR-CON M) 10 MEQ tablet Take 10 mEq by mouth daily as needed (take with furosemide).  ? tamsulosin (FLOMAX) 0.4 MG CAPS capsule Take 2 capsules (0.8 mg total) by mouth daily.  ? valsartan (DIOVAN) 320 MG tablet TAKE ONE TABLET BY MOUTH DAILY (Patient taking differently: Take 320 mg by mouth daily.)  ? zolpidem (AMBIEN) 10 MG tablet Take 1 tablet (10 mg total) by mouth at bedtime.  ?  ? ?Allergies:   Avelox [moxifloxacin hcl], Cartia xt [diltiazem hcl], Pravachol [pravastatin sodium], Prednisone, and Zetia [ezetimibe]   ? ?Social History  ? ?Socioeconomic History  ? Marital status: Married  ?  Spouse name: Not on file  ? Number of children: Not on file  ? Years of education: Not on file  ? Highest education level: Not on f

## 2022-01-10 ENCOUNTER — Encounter: Payer: Self-pay | Admitting: Cardiology

## 2022-01-10 ENCOUNTER — Ambulatory Visit (INDEPENDENT_AMBULATORY_CARE_PROVIDER_SITE_OTHER): Payer: Medicare Other | Admitting: Cardiology

## 2022-01-10 VITALS — BP 148/70 | HR 52 | Ht 70.0 in | Wt 225.2 lb

## 2022-01-10 DIAGNOSIS — I110 Hypertensive heart disease with heart failure: Secondary | ICD-10-CM

## 2022-01-10 DIAGNOSIS — R001 Bradycardia, unspecified: Secondary | ICD-10-CM | POA: Diagnosis not present

## 2022-01-10 DIAGNOSIS — I495 Sick sinus syndrome: Secondary | ICD-10-CM | POA: Diagnosis not present

## 2022-01-10 DIAGNOSIS — I480 Paroxysmal atrial fibrillation: Secondary | ICD-10-CM

## 2022-01-10 DIAGNOSIS — I5032 Chronic diastolic (congestive) heart failure: Secondary | ICD-10-CM

## 2022-01-10 DIAGNOSIS — Z7901 Long term (current) use of anticoagulants: Secondary | ICD-10-CM | POA: Diagnosis not present

## 2022-01-10 DIAGNOSIS — Z79899 Other long term (current) drug therapy: Secondary | ICD-10-CM

## 2022-01-10 NOTE — Patient Instructions (Signed)
Medication Instructions:  ?Your physician recommends that you continue on your current medications as directed. Please refer to the Current Medication list given to you today. ? ?*If you need a refill on your cardiac medications before your next appointment, please call your pharmacy* ? ? ?Lab Work: ?None ?If you have labs (blood work) drawn today and your tests are completely normal, you will receive your results only by: ?MyChart Message (if you have MyChart) OR ?A paper copy in the mail ?If you have any lab test that is abnormal or we need to change your treatment, we will call you to review the results. ? ? ?Testing/Procedures: ?None ? ? ?Follow-Up: ?At [facility], you and your health needs are our priority.  As part of our continuing mission to provide you with exceptional heart care, we have created designated Provider Care Teams.  These Care Teams include your primary Cardiologist (physician) and Advanced Practice Providers (APPs -  Physician Assistants and Nurse Practitioners) who all work together to provide you with the care you need, when you need it. ? ?We recommend signing up for the patient portal called "MyChart".  Sign up information is provided on this After Visit Summary.  MyChart is used to connect with patients for Virtual Visits (Telemedicine).  Patients are able to view lab/test results, encounter notes, upcoming appointments, etc.  Non-urgent messages can be sent to your provider as well.   ?To learn more about what you can do with MyChart, go to [email or web address].   ? ?Your next appointment:   ?6 month(s) ? ?The format for your next appointment:   ?In Person ? ?Provider:   ?Shirlee More, MD  ? ? ?Other Instructions ?Please place FitBit on patient and monitor. ? ?Important Information About Sugar ? ? ? ? ? ? ?

## 2022-01-19 ENCOUNTER — Telehealth: Payer: Self-pay

## 2022-01-19 NOTE — Chronic Care Management (AMB) (Signed)
Chronic Care Management Pharmacy Assistant   Name: John Kemp  MRN: [id number] DOB: September 25, 1945  Reason for Encounter: Disease State/ Hypertension  Recent office visits:  12-24-2021 John Brome, MD. Abnormal UA. T4 free= 2.35.  Recent consult visits:  01-10-2022 John Priest, MD (Cardiology). EKG completed.  Hospital visits:  Medication Reconciliation was completed by comparing discharge summary, patient's EMR and Pharmacy list, and upon discussion with patient.   Admitted to the hospital on 11-29-2021 due to lightheadedness. Discharge date was 11-29-2021. Discharged from [place]?Medications Started at [facility] Discharge:?? None   Medication Changes at Hospital Discharge: None   Medications Discontinued at Hospital Discharge: None   Medications that remain the same after Hospital Discharge:??  -All other medications will remain the same.      Medications: Outpatient Encounter Medications as of 01/19/2022  Medication Sig   amiodarone (PACERONE) 200 MG tablet TAKE 1 TABLET BY MOUTH EVERY DAY (Patient taking differently: Take 200 mg by mouth daily.)   amLODipine (NORVASC) 5 MG tablet Take 1 tablet (5 mg total) by mouth daily.   atorvastatin (LIPITOR) 10 MG tablet TAKE 1 TABLET BY MOUTH EVERYDAY AT BEDTIME (Patient taking differently: Take 10 mg by mouth at bedtime.)   donepezil (ARICEPT) 10 MG tablet TAKE 1 TABLET BY MOUTH EVERYDAY AT BEDTIME (Patient taking differently: Take 10 mg by mouth at bedtime.)   ELIQUIS 5 MG TABS tablet TAKE 1 TABLET BY MOUTH TWICE A DAY (Patient taking differently: Take 5 mg by mouth 2 (two) times daily.)   finasteride (PROSCAR) 5 MG tablet Take 1 tablet (5 mg total) by mouth daily.   fluticasone (FLONASE) 50 MCG/ACT nasal spray Place 2 sprays into both nostrils daily.   furosemide (LASIX) 40 MG tablet Take 40 mg by mouth daily as needed for fluid or edema.   Inulin (FIBER CHOICE PO) Take 3 tablets by mouth daily.    levothyroxine (SYNTHROID) 112 MCG tablet Take 1 tablet (112 mcg total) by mouth daily before breakfast.   montelukast (SINGULAIR) 10 MG tablet Take 1 tablet (10 mg total) by mouth at bedtime. (Patient taking differently: Take 10 mg by mouth at bedtime as needed (allergies).)   nitroGLYCERIN (NITROSTAT) 0.4 MG SL tablet Place 0.4 mg under the tongue every 5 (five) minutes as needed for chest pain.   ondansetron (ZOFRAN) 4 MG tablet Take 1 tablet (4 mg total) by mouth every 8 (eight) hours as needed for nausea or vomiting.   potassium chloride (KLOR-CON M) 10 MEQ tablet Take 10 mEq by mouth daily as needed (take with furosemide).   tamsulosin (FLOMAX) 0.4 MG CAPS capsule Take 2 capsules (0.8 mg total) by mouth daily.   valsartan (DIOVAN) 320 MG tablet TAKE ONE TABLET BY MOUTH DAILY (Patient taking differently: Take 320 mg by mouth daily.)   zolpidem (AMBIEN) 10 MG tablet Take 1 tablet (10 mg total) by mouth at bedtime.   No facility-administered encounter medications on file as of 01/19/2022.   Recent Office Vitals: BP Readings from Last 3 Encounters:  01/10/22 (!) 148/70  12/24/21 118/68  12/02/21 122/68   Pulse Readings from Last 3 Encounters:  01/10/22 (!) 52  12/24/21 (!) 53  12/02/21 77    Wt Readings from Last 3 Encounters:  01/10/22 225 lb 3.2 oz (102.2 kg)  12/24/21 226 lb (102.5 kg)  12/02/21 223 lb 9.6 oz (101.4 kg)     Kidney Function Lab Results  Component Value Date/Time  CREATININE 1.12 11/29/2021 05:03 AM   CREATININE 1.15 11/01/2021 11:15 AM   GFRNONAA >60 11/29/2021 05:03 AM   GFRAA 54 (L) 09/21/2020 10:05 AM       Latest Ref Rng & Units 11/29/2021    5:03 AM 11/01/2021   11:15 AM 04/23/2021    9:53 AM  BMP  Glucose 70 - 99 mg/dL 104   84   94    BUN 8 - 23 mg/dL '16   14   15    '$ Creatinine 0.61 - 1.24 mg/dL 1.12   1.15   1.26    BUN/Creat Ratio 10 - '24  12   12    '$ Sodium 135 - 145 mmol/L 134   142   141    Potassium 3.5 - 5.1 mmol/L 3.5   4.2   3.8     Chloride 98 - 111 mmol/L 103   103   102    CO2 22 - 32 mmol/L '23   27   24    '$ Calcium 8.9 - 10.3 mg/dL 8.6   8.9   9.3       01-19-2022: 1st attempt VM full 01-20-2022: 2nd attempt VM full. Left VM on home number 01-21-2022 : 2nd attempt VM full. Left VM on home number  Care Gaps: Last annual wellness visit? 08-26-2021  Hep C screening overdue Shingrix overdue 3rd Moderna vaccine overdue last completed 12-18-2020  Star Rating Drugs: Valsartan 320 mg- Last filled 11-28-2021 90 DS Atorvastatin 10 mg- Last filled 11-11-2021 90 DS  [place] Clinical Pharmacist Assistant [phone]

## 2022-01-27 ENCOUNTER — Other Ambulatory Visit: Payer: Self-pay

## 2022-01-27 DIAGNOSIS — I5032 Chronic diastolic (congestive) heart failure: Secondary | ICD-10-CM

## 2022-01-27 MED ORDER — AMIODARONE HCL 200 MG PO TABS: 200.0000 mg | ORAL_TABLET | Freq: Every day | ORAL | 2 refills | Status: DC

## 2022-01-28 ENCOUNTER — Ambulatory Visit (INDEPENDENT_AMBULATORY_CARE_PROVIDER_SITE_OTHER): Payer: Medicare Other

## 2022-01-28 NOTE — Patient Instructions (Signed)
Visit Information   Goals Addressed   None    Patient Care Plan: CCM Pharmacy Care Plan     Problem Identified: htn, hld, chf, afib   Priority: High  Onset Date: 12/07/2020     Long-Range Goal: Disease Management   Start Date: 12/07/2020  Expected End Date: 12/07/2021  Recent Progress: On track  Priority: High  Note:     Current Barriers:  Unable to independently afford treatment regimen  Pharmacist Clinical Goal(s):  Patient will verbalize ability to afford treatment regimen through collaboration with PharmD and provider.   Interventions: 1:1 collaboration with Cox, Kirsten, MD regarding development and update of comprehensive plan of care as evidenced by provider attestation and co-signature Inter-disciplinary care team collaboration (see longitudinal plan of care) Comprehensive medication review performed; medication list updated in electronic medical record  Hypertension (BP goal <130/80) -Controlled -Current treatment: Amlodipine 5 mg bid Appropriate, Effective, Safe, Accessible Furosemide 40 mg Prn Appropriate, Effective, Safe, Accessible Valsartan 320 mg daily Appropriate, Effective, Safe, Accessible Potassium 10 meq daily prn Appropriate, Effective, Safe, Accessible -Medications previously tried: diltiazem, Carvedilol -Current home readings: not checking -Current dietary habits: sandwich, cookies, home cooking -Current exercise habits: no regular exercise -Denies hypotensive/hypertensive symptoms -Educated on BP goals and benefits of medications for prevention of heart attack, stroke and kidney damage; Daily salt intake goal < 2300 mg; Exercise goal of 150 minutes per week; Importance of home blood pressure monitoring; -Counseled to monitor BP at home weekly, document, and provide log at future appointments -Counseled on diet and exercise extensively Recommended to continue current medication Recommended beginning to check bp weekly at home   Hyperlipidemia:  (LDL goal < 55) -Not ideally controlled -Current treatment: atorvastatin 10 mg daily at bedtime Appropriate, Effective, Safe, Accessible -Medications previously tried: none reported  -Current dietary patterns: no specific diet.  -Current exercise habits: no regular exercise -Educated on Cholesterol goals;  Benefits of statin for ASCVD risk reduction; Importance of limiting foods high in cholesterol; Exercise goal of 150 minutes per week; -Counseled on diet and exercise extensively Recommended to continue current medication  Atrial Fibrillation (Goal: prevent stroke and major bleeding) -Managed by Dr. Bettina Gavia -Controlled -CHADSVASC: 5 -Current treatment: Rhythm control:  Amiodarone 200 mg daily Appropriate, Effective, Safe, Accessible Anticoagulation:   Apixaban 5 mg bid Appropriate, Effective, Safe, Query accessible -Medications previously tried: diltiazem -Home BP and HR readings: not checking currently  -Counseled on increased risk of stroke due to Afib and benefits of anticoagulation for stroke prevention; importance of adherence to anticoagulant exactly as prescribed; bleeding risk associated with Eliquis and importance of self-monitoring for signs/symptoms of bleeding; avoidance of NSAIDs due to increased bleeding risk with anticoagulants; Coordinating patient assistance application for Eliquis and samples of Eliquis. -Recommended to continue current medication April 2022: Assessed patient finances. Patient has not spent 3% of income on medication at this time. Will continue to check for Eliquis PAP application October 4580: Will have team check on status of PAP, if not approved, will consider talking with PCP/Specialist about Xarelto PAP if it's possible to have that one approved easier than Eliquis. Patient told me he asked Cardio about this as well June 2023: Unable to get PAP approves    Heart Failure (Goal: manage symptoms and prevent exacerbations) -Managed by Dr.  Bettina Gavia -Controlled -Last ejection fraction: 08/18 (Date: 55-60%) -HF type: Diastolic -Current treatment: Amlodipine 5 mg daily Appropriate, Effective, Safe, Accessible Furosemide 40 mg PRN Appropriate, Effective, Safe, Accessible Valsartan 320 mg daily Appropriate, Effective, Safe, Accessible  Potassium 10 meq daily prn Appropriate, Effective, Safe, Accessible Nitroglycerin 0.4 mg sl every 5 minutes prn chest pain Appropriate, Effective, Safe, Accessible -Medications previously tried: diltiazem   -Current home BP/HR readings: none reported/not checking at home -Current dietary habits: denies specific diet. Eats sandwiches some and wife cooks some.  -Current exercise habits: no regular exercise -Educated on Benefits of medications for managing symptoms and prolonging life Proper diuretic administration and potassium supplementation Importance of blood pressure control -Counseled on diet and exercise extensively Recommended to continue current medication  Hypothyroid (Goal: manage TSH/Symptoms ) Lab Results  Component Value Date   TSH 1.080 12/24/2021  -Current treatment  levothyroxine 112 mcg daily Appropriate, Effective, Safe, Accessible -Medications previously tried: none reported  -Recommended to continue current medication   Memory (Goal: manage memory) -Controlled -Current treatment  donepezil 10 mg daily Appropriate, Effective, Safe, Accessible -Medications previously tried: none reported  -Recommended to continue current medication  Insomnia (Goal: 7-8 hours of sleep/night) -Controlled -Patient currently getting 8 hours of sleep/night -Patient currently waking up 1 times/night -Patient's concern is Falling asleep -Current treatment: Zolpidem '10mg'$  take '5mg'$  HS Query Appropriate, Effective, Query Safe, Accessible -Medications previously tried: N/A -Counseled on sleep hygiene techniques (consistent sleep/wake up schedule, no electronic screens 1-2 hours before bed,  etc) June 2023: Patient experiences vertigo during the night (Went to the ER twice for it). Dr. Bettina Gavia on his 01/10/22 visit thinks it's the Zolpidem and I agree. Patient has cut in half and still experiencing ADR's. States if he doesn't take it at all, he can't sleep and just stares at the ceiling. Will ask PCP for alternative therapy  BPH (Goal: improve urination) -IPSS Questionnaire (AUA-7): 01/28/22 Over the past month.   1)  How often have you had a sensation of not emptying your bladder completely after you finish urinating?  1 - Less than 1 time in 5  2)  How often have you had to urinate again less than two hours after you finished urinating? 1 - Less than 1 time in 5  3)  How often have you found you stopped and started again several times when you urinated?  0 - Not at all  4) How difficult have you found it to postpone urination?  0 - Not at all  5) How often have you had a weak urinary stream?  5 - Almost always  6) How often have you had to push or strain to begin urination?  0 - Not at all  7) How many times did you most typically get up to urinate from the time you went to bed until the time you got up in the morning?  1 - 1 time  Total score:  0-7 mildly symptomatic   8-19 moderately symptomatic   20-35 severely symptomatic    -Not ideally controlled -Night time urination frequency: 1/night -Current treatment: Tamsulosin 0.'4mg'$  take 2HS Appropriate, Effective, Safe, Accessible Finasteride '5mg'$  HS Appropriate, Effective, Safe, Accessible -Medications previously tried: N/A -Counseled on non-pharmacologic techniques such as Kegels June 2023: Spent extensive time counseling on Kegels and how Finasteride will take a while to "kick in"    Patient Goals/Self-Care Activities Patient will:  - take medications as prescribed focus on medication adherence by considering using a pill box target a minimum of 150 minutes of moderate intensity exercise weekly engage in dietary  modifications by limiting salt and focusing on heart-healthy diet.   Follow Up Plan: Telephone follow up appointment with care management team member scheduled for:  Jan 2024  [name], Pharm.D. - [phone]      Mr. Tammy Ericsson was given information about Chronic Care Management services today including:  CCM service includes personalized support from designated clinical staff supervised by his physician, including individualized plan of care and coordination with other care providers 24/7 contact phone numbers for assistance for urgent and routine care needs. Standard insurance, coinsurance, copays and deductibles apply for chronic care management only during months in which we provide at least 20 minutes of these services. Most insurances cover these services at 100%, however patients may be responsible for any copay, coinsurance and/or deductible if applicable. This service may help you avoid the need for more expensive face-to-face services. Only one practitioner may furnish and bill the service in a calendar month. The patient may stop CCM services at any time (effective at the end of the month) by phone call to the office staff.  Patient agreed to services and verbal consent obtained.   The patient verbalized understanding of instructions, educational materials, and care plan provided today and DECLINED offer to receive copy of patient instructions, educational materials, and care plan.  The pharmacy team will reach out to the patient again over the next 60 days.   Lane Hacker, [place]

## 2022-01-28 NOTE — Progress Notes (Signed)
Chronic Care Management Pharmacy Note  01/28/2022 Name:  John Kemp MRN:  [id number] DOB:  1946-01-17   Plan Recommendations:  Patient experiences vertigo during the night Martin Majestic to the ER twice for it). Dr. Bettina Gavia on his 01/10/22 visit thinks it's the Zolpidem and I agree. Patient has cut in half and still experiencing ADR's. States if he doesn't take it at all, he can't sleep and just stares at the ceiling. Will ask PCP for alternative therapy  Subjective: John Kemp is an 76 y.o. year old male who is a primary patient of Cox, Kirsten, MD.  The CCM team was consulted for assistance with disease management and care coordination needs.    Engaged with patient face to face for follow up visit in response to provider referral for pharmacy case management and/or care coordination services.   Consent to Services:  The patient was given the following information about Chronic Care Management services today, agreed to services, and gave verbal consent: 1. CCM service includes personalized support from designated clinical staff supervised by the primary care provider, including individualized plan of care and coordination with other care providers 2. 24/7 contact phone numbers for assistance for urgent and routine care needs. 3. Service will only be billed when office clinical staff spend 20 minutes or more in a month to coordinate care. 4. Only one practitioner may furnish and bill the service in a calendar month. 5.The patient may stop CCM services at any time (effective at the end of the month) by phone call to the office staff. 6. The patient will be responsible for cost sharing (co-pay) of up to 20% of the service fee (after annual deductible is met). Patient agreed to services and consent obtained.  Patient Care Team: [name], MD as PCP - General (Family Medicine) Lane Hacker, [facility] as Pharmacist (Pharmacist)  Recent office visits:  12-24-2021 [name], MD. Abnormal UA.  T4 free= 2.35.   Recent consult visits:  01-10-2022 [name], MD (Cardiology). EKG completed.   Hospital visits:  Medication Reconciliation was completed by comparing discharge summary, patient's EMR and Pharmacy list, and upon discussion with patient.   Admitted to the hospital on 11-29-2021 due to lightheadedness. Discharge date was 11-29-2021. Discharged from [place]?Medications Started at [facility] Discharge:?? None   Medication Changes at Hospital Discharge: None   Medications Discontinued at Hospital Discharge: None   Medications that remain the same after Hospital Discharge:??  -All other medications will remain the same.  Objective:  Lab Results  Component Value Date   CREATININE 1.12 11/29/2021   BUN 16 11/29/2021   GFRNONAA >60 11/29/2021   GFRAA 54 (L) 09/21/2020   NA 134 (L) 11/29/2021   K 3.5 11/29/2021   CALCIUM 8.6 (L) 11/29/2021   CO2 23 11/29/2021   GLUCOSE 104 (H) 11/29/2021    Lab Results  Component Value Date/Time   HGBA1C 5.4 04/23/2021 09:49 AM   HGBA1C 5.3 05/22/2020 09:07 AM   MICROALBUR Positive 150 01/23/2019 12:00 AM    Last diabetic Eye exam: No results found for: HMDIABEYEEXA  Last diabetic Foot exam: No results found for: HMDIABFOOTEX   Lab Results  Component Value Date   CHOL 123 11/01/2021   HDL 43 11/01/2021   LDLCALC 67 11/01/2021   TRIG 63 11/01/2021   CHOLHDL 2.9 11/01/2021       Latest Ref Rng & Units 11/29/2021    5:03 AM 11/01/2021   11:15 AM  04/23/2021    9:53 AM  Hepatic Function  Total Protein 6.5 - 8.1 g/dL 6.3   6.8   6.8    Albumin 3.5 - 5.0 g/dL 3.7   4.3   4.5    AST 15 - 41 U/L '20   22   22    ' ALT 0 - 44 U/L '21   19   21    ' Alk Phosphatase 38 - 126 U/L 66   95   95    Total Bilirubin 0.3 - 1.2 mg/dL 0.9   0.8   1.0      Lab Results  Component Value Date/Time   TSH 1.080 12/24/2021 10:58 AM   TSH 0.222 (L) 11/01/2021 11:15 AM   FREET4 2.35 (H) 12/24/2021 10:58 AM   FREET4  2.51 (H) 11/01/2021 11:15 AM       Latest Ref Rng & Units 11/29/2021    5:03 AM 04/23/2021    9:53 AM 02/18/2021    8:11 AM  CBC  WBC 4.0 - 10.5 K/uL 7.5   5.8   5.4    Hemoglobin 13.0 - 17.0 g/dL 14.6   14.5   15.0    Hematocrit 39.0 - 52.0 % 41.9   42.5   44.7    Platelets 150 - 400 K/uL 168   190   171      Lab Results  Component Value Date/Time   VD25OH 13.6 (L) 10/23/2017 12:35 PM    Clinical ASCVD: Yes  The ASCVD Risk score (Arnett DK, et al., 2019) failed to calculate for the following reasons:   The patient has a prior MI or stroke diagnosis       11/16/2021   11:20 AM 08/31/2021    8:44 AM 08/26/2021    4:20 PM  Depression screen PHQ 2/9  Decreased Interest 0 0 0  Down, Depressed, Hopeless 0 0 0  PHQ - 2 Score 0 0 0      Social History   Tobacco Use  Smoking Status Former   Packs/day: 1.00   Years: 15.00   Pack years: 15.00   Types: Cigarettes   Quit date: 08/29/1978   Years since quitting: 43.4   Passive exposure: Past  Smokeless Tobacco Never   BP Readings from Last 3 Encounters:  01/10/22 (!) 148/70  12/24/21 118/68  12/02/21 122/68   Pulse Readings from Last 3 Encounters:  01/10/22 (!) 52  12/24/21 (!) 53  12/02/21 77   Wt Readings from Last 3 Encounters:  01/10/22 225 lb 3.2 oz (102.2 kg)  12/24/21 226 lb (102.5 kg)  12/02/21 223 lb 9.6 oz (101.4 kg)   BMI Readings from Last 3 Encounters:  01/10/22 32.31 kg/m  12/24/21 32.43 kg/m  12/02/21 32.08 kg/m    Assessment/Interventions: Review of patient past medical history, allergies, medications, health status, including review of consultants reports, laboratory and other test data, was performed as part of comprehensive evaluation and provision of chronic care management services.   SDOH:  (Social Determinants of Health) assessments and interventions performed: Yes SDOH Interventions    Flowsheet Row Most Recent Value  SDOH Interventions   Financial Strain Interventions Other (Comment)   Transportation Interventions Intervention Not Indicated      SDOH Screenings   Alcohol Screen: Low Risk    Last Alcohol Screening Score (AUDIT): 0  Depression (PHQ2-9): Low Risk    PHQ-2 Score: 0  Financial Resource Strain: High Risk   Difficulty of Paying Living Expenses: Hard  Food  Insecurity: Not on file  Housing: Not on file  Physical Activity: Not on file  Social Connections: Not on file  Stress: Not on file  Tobacco Use: Medium Risk   Smoking Tobacco Use: Former   Smokeless Tobacco Use: Never   Passive Exposure: Past  Transportation Needs: No Transportation Needs   Lack of Transportation (Medical): No   Lack of Transportation (Non-Medical): No    CCM Care Plan  Allergies  Allergen Reactions   Avelox [Moxifloxacin Hcl]    Cartia Xt [Diltiazem Hcl] Nausea And Vomiting   Pravachol [Pravastatin Sodium] Other (See Comments)    myalgia   Prednisone Other (See Comments)    "makes me feel terrible"   Zetia [Ezetimibe] Other (See Comments)    myalgias    Medications Reviewed Today     Reviewed by Lane Hacker, [facility] (Pharmacist) on 01/28/22 at 1051  Med List Status: <None>   Medication Order Taking? Sig Documenting Provider Last Dose Status Informant  amiodarone (PACERONE) 200 MG tablet [id number]  Take 1 tablet (200 mg total) by mouth daily. [name], MD  Active   amLODipine (NORVASC) 5 MG tablet [id number] No Take 1 tablet (5 mg total) by mouth daily. [name], MD Taking Active Self  atorvastatin (LIPITOR) 10 MG tablet [id number] No TAKE 1 TABLET BY MOUTH EVERYDAY AT BEDTIME  Patient taking differently: Take 10 mg by mouth at bedtime.   Cox, Kirsten, MD Taking Active Self  donepezil (ARICEPT) 10 MG tablet [id number] No TAKE 1 TABLET BY MOUTH EVERYDAY AT BEDTIME  Patient taking differently: Take 10 mg by mouth at bedtime.   Cox, Kirsten, MD Taking Active Self  ELIQUIS 5 MG TABS tablet [id number] No TAKE 1 TABLET BY MOUTH TWICE A DAY  Patient taking  differently: Take 5 mg by mouth 2 (two) times daily.   Cox, Kirsten, MD Taking Active Self  finasteride (PROSCAR) 5 MG tablet [id number] No Take 1 tablet (5 mg total) by mouth daily. Cox, Kirsten, MD Taking Active   fluticasone [facility]) 50 MCG/ACT nasal spray [id number] No Place 2 sprays into both nostrils daily. Cox, Kirsten, MD Taking Active Self  furosemide (LASIX) 40 MG tablet [id number] No Take 40 mg by mouth daily as needed for fluid or edema. [provider] Taking Active Self  Inulin (FIBER CHOICE PO) [id number] No Take 3 tablets by mouth daily. [provider] Taking Active Self  levothyroxine (SYNTHROID) 112 MCG tablet [id number] No Take 1 tablet (112 mcg total) by mouth daily before breakfast. Cox, Kirsten, MD Taking Active   montelukast (SINGULAIR) 10 MG tablet [id number] No Take 1 tablet (10 mg total) by mouth at bedtime.  Patient taking differently: Take 10 mg by mouth at bedtime as needed (allergies).   Cox, Kirsten, MD Taking Active Self  nitroGLYCERIN (NITROSTAT) 0.4 MG SL tablet [id number] No Place 0.4 mg under the tongue every 5 (five) minutes as needed for chest pain. [provider] Taking Active Self  ondansetron (ZOFRAN) 4 MG tablet [id number] No Take 1 tablet (4 mg total) by mouth every 8 (eight) hours as needed for nausea or vomiting. Cox, Kirsten, MD Taking Active Self  potassium chloride (KLOR-CON M) 10 MEQ tablet [id number] No Take 10 mEq by mouth daily as needed (take with furosemide). [provider] Taking Active Self  tamsulosin (FLOMAX) 0.4 MG CAPS capsule [id number] No Take 2 capsules (0.8 mg total) by mouth daily. [name], MD Taking Active   valsartan (DIOVAN) 320 MG tablet [id number] No  TAKE ONE TABLET BY MOUTH DAILY  Patient taking differently: Take 320 mg by mouth daily.   Cox, Kirsten, MD Taking Active Self  zolpidem (AMBIEN) 10 MG tablet [id number] No Take 1 tablet (10 mg total) by mouth at bedtime. [name], MD Taking Active              Patient Active Problem List   Diagnosis Date Noted   Benign prostatic hyperplasia with urinary frequency 12/24/2021   Physical deconditioning 12/07/2021   Persistent atrial fibrillation ([place]) 11/12/2021   Atherosclerotic heart disease of native coronary artery without angina pectoris 11/12/2021   Coronary artery disease involving autologous artery coronary bypass graft 11/12/2021   Dizziness 11/12/2021   Dyslipidemia 11/12/2021   Elevated brain natriuretic peptide (BNP) level 11/12/2021   Left rotator cuff tear 11/12/2021   Atrial fibrillation with rapid ventricular response ([place]) 11/12/2021   Atrial flutter with rapid ventricular response ([place]) 11/12/2021   Acute non-recurrent maxillary sinusitis 10/28/2021   Acute bronchitis due to other specified organisms 10/28/2021   Non-seasonal allergic rhinitis due to pollen 10/20/2021   Hearing loss of right ear due to cerumen impaction 10/20/2021   Acute cough 10/20/2021   Vitamin D deficiency, unspecified    Primary insomnia    Impaired fasting glucose    Hypertensive heart disease with heart failure (HCC)    Chronic obstructive pulmonary disease (COPD) ([place])    Cancer ([place]) 12/02/2019   Acquired thrombophilia ([place]) 11/17/2019   Sequela of cardioembolic stroke 27/74/1287   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 11/17/2019   Mixed hyperlipidemia 11/17/2019   Squamous cell carcinoma 11/05/2019   CHF (congestive heart failure) ([place]) 11/05/2019   Acquired hypothyroidism 04/24/2018   High cholesterol 10/20/2017   Chronic diastolic (congestive) heart failure ([place]) 04/02/2017   PAF (paroxysmal atrial fibrillation) (HCC)    Severe obesity with body mass index (BMI) of 36.0 to 36.9 with serious comorbidity (HCC)    Weakness of both lower extremities 03/03/2017   Hx of CABG 03/02/2017   Anticoagulated 01/21/2015   CAD in native artery 01/21/2015   Chronic diastolic heart failure ([employer]) 01/21/2015   On  amiodarone therapy 01/21/2015   Typical atrial flutter ([place]) 01/21/2015   Essential hypertension 01/21/2015    Immunization History  Administered Date(s) Administered   Fluad Quad(high Dose 65+) 05/22/2020, 04/23/2021   Influenza Split 05/29/2014   Influenza-Unspecified 06/29/2018, 06/04/2019   Moderna SARS-COV2 Booster Vaccination 06/30/2020, 12/18/2020   Moderna Sars-Covid-2 Vaccination 10/25/2019, 11/27/2019   Pneumococcal Conjugate-13 07/12/2017   Pneumococcal Polysaccharide-23 07/19/2018   Tdap 04/30/2014    Conditions to be addressed/monitored:  Hypertension, Hyperlipidemia, Diabetes, Atrial Fibrillation, Heart Failure, Coronary Artery Disease and COPD  Care Plan : [place]  Updates made by Lane Hacker, RPH since 01/28/2022 12:00 AM     Problem: htn, hld, chf, afib   Priority: High  Onset Date: 12/07/2020     Long-Range Goal: Disease Management   Start Date: 12/07/2020  Expected End Date: 12/07/2021  Recent Progress: On track  Priority: High  Note:     Current Barriers:  Unable to independently afford treatment regimen  Pharmacist Clinical Goal(s):  Patient will verbalize ability to afford treatment regimen through collaboration with PharmD and provider.   Interventions: 1:1 collaboration with [name], MD regarding development and update of comprehensive plan of care as evidenced by provider attestation and co-signature Inter-disciplinary care team collaboration (see longitudinal plan of care) Comprehensive medication review performed; medication list updated in electronic medical  record  Hypertension (BP goal <130/80) -Controlled -Current treatment: Amlodipine 5 mg bid Appropriate, Effective, Safe, Accessible Furosemide 40 mg Prn Appropriate, Effective, Safe, Accessible Valsartan 320 mg daily Appropriate, Effective, Safe, Accessible Potassium 10 meq daily prn Appropriate, Effective, Safe, Accessible -Medications previously tried:  diltiazem, Carvedilol -Current home readings: not checking -Current dietary habits: sandwich, cookies, home cooking -Current exercise habits: no regular exercise -Denies hypotensive/hypertensive symptoms -Educated on BP goals and benefits of medications for prevention of heart attack, stroke and kidney damage; Daily salt intake goal < 2300 mg; Exercise goal of 150 minutes per week; Importance of home blood pressure monitoring; -Counseled to monitor BP at home weekly, document, and provide log at future appointments -Counseled on diet and exercise extensively Recommended to continue current medication Recommended beginning to check bp weekly at home   Hyperlipidemia: (LDL goal < 55) -Not ideally controlled -Current treatment: atorvastatin 10 mg daily at bedtime Appropriate, Effective, Safe, Accessible -Medications previously tried: none reported  -Current dietary patterns: no specific diet.  -Current exercise habits: no regular exercise -Educated on Cholesterol goals;  Benefits of statin for ASCVD risk reduction; Importance of limiting foods high in cholesterol; Exercise goal of 150 minutes per week; -Counseled on diet and exercise extensively Recommended to continue current medication  Atrial Fibrillation (Goal: prevent stroke and major bleeding) -Managed by Dr. Bettina Gavia -Controlled -CHADSVASC: 5 -Current treatment: Rhythm control:  Amiodarone 200 mg daily Appropriate, Effective, Safe, Accessible Anticoagulation:   Apixaban 5 mg bid Appropriate, Effective, Safe, Query accessible -Medications previously tried: diltiazem -Home BP and HR readings: not checking currently  -Counseled on increased risk of stroke due to Afib and benefits of anticoagulation for stroke prevention; importance of adherence to anticoagulant exactly as prescribed; bleeding risk associated with Eliquis and importance of self-monitoring for signs/symptoms of bleeding; avoidance of NSAIDs due to increased  bleeding risk with anticoagulants; Coordinating patient assistance application for Eliquis and samples of Eliquis. -Recommended to continue current medication April 2022: Assessed patient finances. Patient has not spent 3% of income on medication at this time. Will continue to check for Eliquis PAP application October 6226: Will have team check on status of PAP, if not approved, will consider talking with PCP/Specialist about Xarelto PAP if it's possible to have that one approved easier than Eliquis. Patient told me he asked Cardio about this as well June 2023: Unable to get PAP approves    Heart Failure (Goal: manage symptoms and prevent exacerbations) -Managed by Dr. Bettina Gavia -Controlled -Last ejection fraction: 08/18 (Date: 55-60%) -HF type: Diastolic -Current treatment: Amlodipine 5 mg daily Appropriate, Effective, Safe, Accessible Furosemide 40 mg PRN Appropriate, Effective, Safe, Accessible Valsartan 320 mg daily Appropriate, Effective, Safe, Accessible Potassium 10 meq daily prn Appropriate, Effective, Safe, Accessible Nitroglycerin 0.4 mg sl every 5 minutes prn chest pain Appropriate, Effective, Safe, Accessible -Medications previously tried: diltiazem   -Current home BP/HR readings: none reported/not checking at home -Current dietary habits: denies specific diet. Eats sandwiches some and wife cooks some.  -Current exercise habits: no regular exercise -Educated on Benefits of medications for managing symptoms and prolonging life Proper diuretic administration and potassium supplementation Importance of blood pressure control -Counseled on diet and exercise extensively Recommended to continue current medication  Hypothyroid (Goal: manage TSH/Symptoms ) Lab Results  Component Value Date   TSH 1.080 12/24/2021  -Current treatment  levothyroxine 112 mcg daily Appropriate, Effective, Safe, Accessible -Medications previously tried: none reported  -Recommended to continue current  medication   Memory (Goal: manage memory) -Controlled -Current treatment  donepezil 10 mg daily Appropriate, Effective, Safe, Accessible -Medications previously tried: none reported  -Recommended to continue current medication  Insomnia (Goal: 7-8 hours of sleep/night) -Controlled -Patient currently getting 8 hours of sleep/night -Patient currently waking up 1 times/night -Patient's concern is Falling asleep -Current treatment: Zolpidem 58m take 551mHS Query Appropriate, Effective, Query Safe, Accessible -Medications previously tried: N/A -Counseled on sleep hygiene techniques (consistent sleep/wake up schedule, no electronic screens 1-2 hours before bed, etc) June 2023: Patient experiences vertigo during the night (Went to the ER twice for it). Dr. MuBettina Gavian his 01/10/22 visit thinks it's the Zolpidem and I agree. Patient has cut in half and still experiencing ADR's. States if he doesn't take it at all, he can't sleep and just stares at the ceiling. Will ask PCP for alternative therapy  BPH (Goal: improve urination) -IPSS Questionnaire (AUA-7): 01/28/22 Over the past month.   1)  How often have you had a sensation of not emptying your bladder completely after you finish urinating?  1 - Less than 1 time in 5  2)  How often have you had to urinate again less than two hours after you finished urinating? 1 - Less than 1 time in 5  3)  How often have you found you stopped and started again several times when you urinated?  0 - Not at all  4) How difficult have you found it to postpone urination?  0 - Not at all  5) How often have you had a weak urinary stream?  5 - Almost always  6) How often have you had to push or strain to begin urination?  0 - Not at all  7) How many times did you most typically get up to urinate from the time you went to bed until the time you got up in the morning?  1 - 1 time  Total score:  0-7 mildly symptomatic   8-19 moderately symptomatic   20-35 severely  symptomatic    -Not ideally controlled -Night time urination frequency: 1/night -Current treatment: Tamsulosin 0.87m7make 2HS Appropriate, Effective, Safe, Accessible Finasteride 5mg387m Appropriate, Effective, Safe, Accessible -Medications previously tried: N/A -Counseled on non-pharmacologic techniques such as Kegels June 2023: Spent extensive time counseling on Kegels and how Finasteride will take a while to "kick in"    Patient Goals/Self-Care Activities Patient will:  - take medications as prescribed focus on medication adherence by considering using a pill box target a minimum of 150 minutes of moderate intensity exercise weekly engage in dietary modifications by limiting salt and focusing on heart-healthy diet.   Follow Up Plan: Telephone follow up appointment with care management team member scheduled for: Jan 2024  [place]- [phone]       Medication Assistance: Application for Eliquis  medication assistance program. in process.  Anticipated assistance start date 12/2020.  See plan of care for additional detail.  Patient's preferred pharmacy is:  [id number] [place]: [phone] [address] [place]: [phone] pill box? No - keeps medications together.  Pt endorses good compliance - had stopped donepezil  We discussed: Current pharmacy is preferred with insurance plan and patient is satisfied with pharmacy services Patient decided to: Continue current medication management strategy  Care Plan and Follow Up Patient Decision:  Patient agrees to Care Plan and Follow-up.  Plan: Telephone follow up appointment with care management team member scheduled for:  Jan 2024  [name], [place].D. - [phone]

## 2022-02-01 DIAGNOSIS — H81399 Other peripheral vertigo, unspecified ear: Secondary | ICD-10-CM | POA: Diagnosis not present

## 2022-02-01 DIAGNOSIS — R270 Ataxia, unspecified: Secondary | ICD-10-CM | POA: Diagnosis not present

## 2022-02-02 ENCOUNTER — Emergency Department (HOSPITAL_COMMUNITY): Payer: Medicare Other

## 2022-02-02 ENCOUNTER — Emergency Department (HOSPITAL_COMMUNITY)
Admission: EM | Admit: 2022-02-02 | Discharge: 2022-02-02 | Disposition: A | Discharge status: Home / Self Care | Payer: Medicare Other | Attending: Emergency Medicine | Admitting: Emergency Medicine

## 2022-02-02 ENCOUNTER — Other Ambulatory Visit: Payer: Self-pay

## 2022-02-02 ENCOUNTER — Encounter: Payer: Self-pay | Admitting: Family Medicine

## 2022-02-02 ENCOUNTER — Ambulatory Visit (INDEPENDENT_AMBULATORY_CARE_PROVIDER_SITE_OTHER): Payer: Medicare Other | Admitting: Family Medicine

## 2022-02-02 VITALS — BP 126/72 | HR 60 | Temp 97.5°F | Resp 16 | Ht 70.0 in | Wt 225.0 lb

## 2022-02-02 DIAGNOSIS — I4891 Unspecified atrial fibrillation: Secondary | ICD-10-CM | POA: Insufficient documentation

## 2022-02-02 DIAGNOSIS — Z79899 Other long term (current) drug therapy: Secondary | ICD-10-CM | POA: Diagnosis not present

## 2022-02-02 DIAGNOSIS — Z794 Long term (current) use of insulin: Secondary | ICD-10-CM | POA: Diagnosis not present

## 2022-02-02 DIAGNOSIS — R112 Nausea with vomiting, unspecified: Secondary | ICD-10-CM | POA: Diagnosis not present

## 2022-02-02 DIAGNOSIS — I10 Essential (primary) hypertension: Secondary | ICD-10-CM | POA: Diagnosis not present

## 2022-02-02 DIAGNOSIS — Z7901 Long term (current) use of anticoagulants: Secondary | ICD-10-CM | POA: Insufficient documentation

## 2022-02-02 DIAGNOSIS — R55 Syncope and collapse: Secondary | ICD-10-CM

## 2022-02-02 DIAGNOSIS — R42 Dizziness and giddiness: Secondary | ICD-10-CM

## 2022-02-02 DIAGNOSIS — I672 Cerebral atherosclerosis: Secondary | ICD-10-CM | POA: Diagnosis not present

## 2022-02-02 DIAGNOSIS — R270 Ataxia, unspecified: Secondary | ICD-10-CM | POA: Insufficient documentation

## 2022-02-02 DIAGNOSIS — R519 Headache, unspecified: Secondary | ICD-10-CM | POA: Diagnosis not present

## 2022-02-02 DIAGNOSIS — I6529 Occlusion and stenosis of unspecified carotid artery: Secondary | ICD-10-CM | POA: Diagnosis not present

## 2022-02-02 LAB — CBC
HCT: 42.2 % (ref 39.0–52.0)
Hemoglobin: 14 g/dL (ref 13.0–17.0)
MCH: 31.4 pg (ref 26.0–34.0)
MCHC: 33.2 g/dL (ref 30.0–36.0)
MCV: 94.6 fL (ref 80.0–100.0)
Platelets: 202 10*3/uL (ref 150–400)
RBC: 4.46 MIL/uL (ref 4.22–5.81)
RDW: 13 % (ref 11.5–15.5)
WBC: 7.1 10*3/uL (ref 4.0–10.5)
nRBC: 0 % (ref 0.0–0.2)

## 2022-02-02 LAB — COMPREHENSIVE METABOLIC PANEL
ALT: 20 U/L (ref 0–44)
AST: 22 U/L (ref 15–41)
Albumin: 3.9 g/dL (ref 3.5–5.0)
Alkaline Phosphatase: 63 U/L (ref 38–126)
Anion gap: 7 (ref 5–15)
BUN: 18 mg/dL (ref 8–23)
CO2: 25 mmol/L (ref 22–32)
Calcium: 9.1 mg/dL (ref 8.9–10.3)
Chloride: 106 mmol/L (ref 98–111)
Creatinine, Ser: 1.44 mg/dL — ABNORMAL HIGH (ref 0.61–1.24)
GFR, Estimated: 51 mL/min — ABNORMAL LOW (ref >60)
Glucose, Bld: 123 mg/dL — ABNORMAL HIGH (ref 70–99)
Potassium: 3.7 mmol/L (ref 3.5–5.1)
Sodium: 138 mmol/L (ref 135–145)
Total Bilirubin: 1.3 mg/dL — ABNORMAL HIGH (ref 0.3–1.2)
Total Protein: 6.6 g/dL (ref 6.5–8.1)

## 2022-02-02 LAB — ETHANOL: Alcohol, Ethyl (B): <10 mg/dL (ref <10)

## 2022-02-02 LAB — DIFFERENTIAL
Abs Immature Granulocytes: 0.02 10*3/uL (ref 0.00–0.07)
Basophils Absolute: 0.1 10*3/uL (ref 0.0–0.1)
Basophils Relative: 1 %
Eosinophils Absolute: 0.1 10*3/uL (ref 0.0–0.5)
Eosinophils Relative: 1 %
Immature Granulocytes: 0 %
Lymphocytes Relative: 26 %
Lymphs Abs: 1.9 10*3/uL (ref 0.7–4.0)
Monocytes Absolute: 0.6 10*3/uL (ref 0.1–1.0)
Monocytes Relative: 8 %
Neutro Abs: 4.6 10*3/uL (ref 1.7–7.7)
Neutrophils Relative %: 64 %

## 2022-02-02 LAB — PROTIME-INR
INR: 1.1 (ref 0.8–1.2)
Prothrombin Time: 14.4 seconds (ref 11.4–15.2)

## 2022-02-02 LAB — APTT: aPTT: 37 seconds — ABNORMAL HIGH (ref 24–36)

## 2022-02-02 MED ORDER — SODIUM CHLORIDE 0.9 % IV BOLUS: 1000.0000 mL | Freq: Once | INTRAVENOUS | Status: DC

## 2022-02-02 NOTE — ED Provider Triage Note (Signed)
Emergency Medicine Provider Triage Evaluation Note  John Kemp , a 76 y.o. male  was evaluated in triage.  Pt complains of ataxia.  He had 7 episodes of these in the last 3 months, they last for multiple hours, the only symptom is he is unable to walk.  Denies feeling like the room is spinning just cannot keep his balance.  He is feeling okay today but called his PCP who sent him to ED for MRI brain and cervical spine without contrast under general anesthesia given patient's history of claustrophobia.  Patient currently asymptomatic.Marland Kitchen  Per HPI  Physical Exam  BP 125/62   Pulse (!) 56   Temp 98.9 F (37.2 C) (Oral)   Resp 16   SpO2 99%  Gen:   Awake, no distress   Resp:  Normal effort  MSK:   Moves extremities without difficulty  Other:  Cranial nerves II through XII are grossly intact.  Grips and equal bilaterally, lower extremity strength equal bilaterally.  Follows commands, no dysarthria  Medical Decision Making  Medically screening exam initiated at 3:09 PM.  Appropriate orders placed.  [name] was informed that the remainder of the evaluation will be completed by another provider, this initial triage assessment does not replace that evaluation, and the importance of remaining in the ED until their evaluation is complete.     Sherrill Raring, PA-C 02/02/22 1801

## 2022-02-02 NOTE — Assessment & Plan Note (Signed)
Ordering an MRI of C-spine and brain.  Patient is claustrophobic so needs to be under general anesthesia.

## 2022-02-02 NOTE — Assessment & Plan Note (Signed)
Ordering an MRI of C-spine and brain.  Patient is claustrophobic so needs to be under general anesthesia.  Concerning for possible stroke versus cervical stenosis.  Symptoms have been going on for 2 months but worsening.  I explained the patient would lose control of his bladder or lose the ability to use his legs, he should call EMS and go directly to the emergency department.

## 2022-02-02 NOTE — Progress Notes (Signed)
Acute Office Visit  Subjective:    Patient ID: John Kemp, male    DOB: 12-04-1945, 76 y.o.   MRN: [id number]  Chief Complaint  Patient presents with   Nausea   Weakness   balance issues    HPI: Patient is in today for ataxia. Walks into the walls.  He has had at least 7 episodes in the last 2 months.  They are increasing in frequency and intensity.  He becomes off balance and starts to walk into walls, sweats profusely and then starts vomiting.  Patient had some presyncopal episodes as well and has had a normal cardiac work-up.  He was also seen at the emergency department approximately 1 month ago and had CT scan of his brain and CT scan of his neck.  CT scan of the neck showed significant arthritis and what appeared to be possible spinal stenosis.  Brain showed no acute strokes. Symptoms do not fit vertigo.  He has not had any falls.  He generally can catch himself.  He was seen at the urgent care on Monday this week and was told to come directly to [facility] for MRI of his brain.  He did not as he felt this would not be done and hit his experience.  Therefore he called yesterday and we set up this appointment.    Past Medical History:  Diagnosis Date   A-fib ([place])    Acquired thrombophilia ([place]) 11/17/2019   Acute bronchitis due to other specified organisms 10/28/2021   Acute cough 10/20/2021   Acute non-recurrent maxillary sinusitis 10/28/2021   Anticoagulated 01/21/2015   Atherosclerotic heart disease of native coronary artery without angina pectoris    Atrial fibrillation with rapid ventricular response ([place]) 11/12/2021   Atrial flutter with rapid ventricular response ([place]) 11/12/2021   CAD in native artery 01/21/2015   Cancer (HCC)    skin cancer   CHF (congestive heart failure) ([place]) 11/05/2019   Chronic diastolic (congestive) heart failure (HCC)    Chronic diastolic heart failure ([place]) 01/21/2015   Last Assessment & Plan:  Is congestive heart failure is mildly decompensated he  is edematous he'll increase the dose of his diuretic today the lab work performed including an ARB referred to care transitions for home care he'll follow-up in my office with me in 4-6 weeks daily weights sodium restrictionand compliance of medications beinghemoglobin of will   Chronic obstructive pulmonary disease (COPD) ([place])    Coronary artery disease involving autologous artery coronary bypass graft 11/12/2021   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 11/17/2019   Dizziness 11/12/2021   Dyslipidemia 11/12/2021   Elevated brain natriuretic peptide (BNP) level 11/12/2021   Essential hypertension 01/21/2015   right superior quadrantanopsia   Hearing loss of right ear due to cerumen impaction 10/20/2021   High cholesterol 10/20/2017   Hx of CABG 03/02/2017   Hypertension    Hypertensive heart disease with heart failure ([place])    Hypothyroidism (acquired) 04/24/2018   Impaired fasting glucose    Left rotator cuff tear 11/12/2021   Mixed hyperlipidemia 11/17/2019   Non-seasonal allergic rhinitis due to pollen 10/20/2021   On amiodarone therapy 01/21/2015   PAF (paroxysmal atrial fibrillation) (HCC)    Primary insomnia    [place]ela of cardioembolic stroke 0/26/3785   Severe obesity with body mass index (BMI) of 36.0 to 36.9 with serious comorbidity ([place])    Squamous cell carcinoma 11/05/2019   Typical atrial flutter ([place]) 01/21/2015   Vitamin D deficiency, unspecified  Past Surgical History:  Procedure Laterality Date   APPENDECTOMY  1956   CARDIAC CATHETERIZATION     CARDIOVERSION  2002   CHOLECYSTECTOMY  2006   CORONARY ARTERY BYPASS GRAFT  2001   HERNIA REPAIR     RADIOLOGY WITH ANESTHESIA Bilateral 01/18/2018   Procedure: MRI WITH ANESTHESIA LUMBAR SPINE WITH AND WITHOUT CONTRAST;  Surgeon: Radiologist, Medication, MD;  Location: [place];  Service: Radiology;  Laterality: Bilateral;   ROTATOR CUFF REPAIR Left    TONSILLECTOMY  1975    Family History  Problem Relation  Age of Onset   Heart disease Father    Arrhythmia Father    Breast cancer Mother    Diabetes type II Sister    Diabetes Brother     Social History   Socioeconomic History   Marital status: Married    Spouse name: Not on file   Number of children: Not on file   Years of education: Not on file   Highest education level: Not on file  Occupational History   Occupation: Retired  Tobacco Use   Smoking status: Former    Packs/day: 1.00    Years: 15.00    Pack years: 15.00    Types: Cigarettes    Quit date: 08/29/1978    Years since quitting: 43.4    Passive exposure: Past   Smokeless tobacco: Never  Vaping Use   Vaping Use: Never used  Substance and Sexual Activity   Alcohol use: No    Alcohol/week: 0.0 standard drinks   Drug use: No   Sexual activity: Not Currently  Other Topics Concern   Not on file  Social History Narrative   Not on file   Social Determinants of Health   Financial Resource Strain: High Risk   Difficulty of Paying Living Expenses: Hard  Food Insecurity: Not on file  Transportation Needs: No Transportation Needs   Lack of Transportation (Medical): No   Lack of Transportation (Non-Medical): No  Physical Activity: Not on file  Stress: Not on file  Social Connections: Not on file  Intimate Partner Violence: Not on file    Outpatient Medications Prior to Visit  Medication Sig Dispense Refill   amiodarone (PACERONE) 200 MG tablet Take 1 tablet (200 mg total) by mouth daily. 90 tablet 2   amLODipine (NORVASC) 5 MG tablet Take 1 tablet (5 mg total) by mouth daily. 90 tablet 1   atorvastatin (LIPITOR) 10 MG tablet TAKE 1 TABLET BY MOUTH EVERYDAY AT BEDTIME (Patient taking differently: Take 10 mg by mouth at bedtime.) 90 tablet 1   donepezil (ARICEPT) 10 MG tablet TAKE 1 TABLET BY MOUTH EVERYDAY AT BEDTIME (Patient taking differently: Take 10 mg by mouth at bedtime.) 90 tablet 1   ELIQUIS 5 MG TABS tablet TAKE 1 TABLET BY MOUTH TWICE A DAY (Patient taking  differently: Take 5 mg by mouth 2 (two) times daily.) 180 tablet 1   finasteride (PROSCAR) 5 MG tablet Take 1 tablet (5 mg total) by mouth daily. 90 tablet 1   fluticasone (FLONASE) 50 MCG/ACT nasal spray Place 2 sprays into both nostrils daily. 16 g 6   furosemide (LASIX) 40 MG tablet Take 40 mg by mouth daily as needed for fluid or edema.     Inulin (FIBER CHOICE PO) Take 3 tablets by mouth daily.     levothyroxine (SYNTHROID) 112 MCG tablet Take 1 tablet (112 mcg total) by mouth daily before breakfast. 60 tablet 0   montelukast (SINGULAIR) 10 MG tablet Take  1 tablet (10 mg total) by mouth at bedtime. (Patient taking differently: Take 10 mg by mouth at bedtime as needed (allergies).) 30 tablet 5   nitroGLYCERIN (NITROSTAT) 0.4 MG SL tablet Place 0.4 mg under the tongue every 5 (five) minutes as needed for chest pain.     potassium chloride (KLOR-CON M) 10 MEQ tablet Take 10 mEq by mouth daily as needed (take with furosemide).     tamsulosin (FLOMAX) 0.4 MG CAPS capsule Take 2 capsules (0.8 mg total) by mouth daily. 180 capsule 1   valsartan (DIOVAN) 320 MG tablet TAKE ONE TABLET BY MOUTH DAILY (Patient taking differently: Take 320 mg by mouth daily.) 90 tablet 3   zolpidem (AMBIEN) 10 MG tablet Take 1 tablet (10 mg total) by mouth at bedtime. 30 tablet 3   ondansetron (ZOFRAN) 4 MG tablet Take 1 tablet (4 mg total) by mouth every 8 (eight) hours as needed for nausea or vomiting. (Patient not taking: Reported on 02/02/2022) 30 tablet 0   No facility-administered medications prior to visit.    Allergies  Allergen Reactions   Avelox [Moxifloxacin Hcl]    Cartia Xt [Diltiazem Hcl] Nausea And Vomiting   Pravachol [Pravastatin Sodium] Other (See Comments)    myalgia   Prednisone Other (See Comments)    "makes me feel terrible"   Zetia [Ezetimibe] Other (See Comments)    myalgias    Review of Systems  Constitutional:  Negative for chills, fatigue and fever.  HENT:  Negative for congestion,  ear pain and sore throat.   Respiratory:  Negative for cough and shortness of breath.   Cardiovascular:  Negative for chest pain.  Gastrointestinal:  Negative for abdominal pain, constipation, diarrhea, nausea and vomiting.  Endocrine: Negative for polydipsia, polyphagia and polyuria.  Genitourinary:  Negative for dysuria and frequency.  Musculoskeletal:  Negative for arthralgias and myalgias.  Neurological:  Positive for headaches. Negative for dizziness.       Abnormal gait.  Disequilibrium.  Psychiatric/Behavioral:  Negative for dysphoric mood.        No dysphoria      Objective:    Physical Exam Vitals reviewed.  Constitutional:      Appearance: Normal appearance.  Neck:     Vascular: No carotid bruit.  Cardiovascular:     Rate and Rhythm: Normal rate and regular rhythm.     Pulses: Normal pulses.     Heart sounds: Normal heart sounds.  Pulmonary:     Effort: Pulmonary effort is normal.     Breath sounds: Normal breath sounds. No wheezing, rhonchi or rales.  Abdominal:     General: Bowel sounds are normal.     Palpations: Abdomen is soft.     Tenderness: There is no abdominal tenderness.  Neurological:     Mental Status: He is alert and oriented to person, place, and time.     Cranial Nerves: Cranial nerves 2-12 are intact.     Sensory: Sensation is intact.     Motor: Motor function is intact.     Coordination: Romberg sign positive. Coordination abnormal. Finger-Nose-Finger Test normal.     Gait: Gait abnormal.     Deep Tendon Reflexes:     Reflex Scores:      Tricep reflexes are 2+ on the right side and 2+ on the left side.      Bicep reflexes are 2+ on the right side and 2+ on the left side.      Brachioradialis reflexes are 2+ on the right  side and 2+ on the left side.      Patellar reflexes are 3+ on the right side and 3+ on the left side.      Achilles reflexes are 2+ on the right side and 2+ on the left side. Psychiatric:        Mood and Affect: Mood normal.         Behavior: Behavior normal.    BP 126/72   Pulse 60   Temp (!) 97.5 F (36.4 C)   Resp 16   Ht '5\' 10"'  (1.778 m)   Wt 225 lb (102.1 kg)   BMI 32.28 kg/m  Wt Readings from Last 3 Encounters:  02/02/22 225 lb (102.1 kg)  01/10/22 225 lb 3.2 oz (102.2 kg)  12/24/21 226 lb (102.5 kg)    Health Maintenance Due  Topic Date Due   Hepatitis C Screening  Never done   Zoster Vaccines- Shingrix (1 of 2) Never done   COVID-19 Vaccine (3 - Moderna risk series) 01/15/2021    There are no preventive care reminders to display for this patient.   Lab Results  Component Value Date   TSH 1.080 12/24/2021   Lab Results  Component Value Date   WBC 7.5 11/29/2021   HGB 14.6 11/29/2021   HCT 41.9 11/29/2021   MCV 91.5 11/29/2021   PLT 168 11/29/2021   Lab Results  Component Value Date   NA 134 (L) 11/29/2021   K 3.5 11/29/2021   CO2 23 11/29/2021   GLUCOSE 104 (H) 11/29/2021   BUN 16 11/29/2021   CREATININE 1.12 11/29/2021   BILITOT 0.9 11/29/2021   ALKPHOS 66 11/29/2021   AST 20 11/29/2021   ALT 21 11/29/2021   PROT 6.3 (L) 11/29/2021   ALBUMIN 3.7 11/29/2021   CALCIUM 8.6 (L) 11/29/2021   ANIONGAP 8 11/29/2021   EGFR 67 11/01/2021   Lab Results  Component Value Date   CHOL 123 11/01/2021   Lab Results  Component Value Date   HDL 43 11/01/2021   Lab Results  Component Value Date   LDLCALC 67 11/01/2021   Lab Results  Component Value Date   TRIG 63 11/01/2021   Lab Results  Component Value Date   CHOLHDL 2.9 11/01/2021   Lab Results  Component Value Date   HGBA1C 5.4 04/23/2021       Assessment & Plan:   Problem List Items Addressed This Visit       Digestive   Nausea and vomiting    Ordering an MRI of C-spine and brain.  Patient is claustrophobic so needs to be under general anesthesia.  Concerning for possible stroke versus cervical stenosis.  Symptoms have been going on for 2 months but worsening.  I explained the patient would lose control  of his bladder or lose the ability to use his legs, he should call EMS and go directly to the emergency department.          Other   Dysequilibrium    Ordering an MRI of C-spine and brain.  Patient is claustrophobic so needs to be under general anesthesia.       Relevant Orders   MR Brain Wo Contrast   MR Cervical Spine Wo Contrast   Ataxia - Primary    Ordering an MRI of C-spine and brain.  Patient is claustrophobic so needs to be under general anesthesia.  Concerning for possible stroke versus cervical stenosis.  Symptoms have been going on for 2 months but worsening.  I explained the patient would lose control of his bladder or lose the ability to use his legs, he should call EMS and go directly to the emergency department.        Relevant Orders   MR Brain Wo Contrast   MR Cervical Spine Wo Contrast   Acute intractable headache    Ordering an MRI of C-spine and brain.  Patient is claustrophobic so needs to be under general anesthesia.  Concerning for possible stroke versus cervical stenosis.  Symptoms have been going on for 2 months but worsening.  I explained the patient would lose control of his bladder or lose the ability to use his legs, he should call EMS and go directly to the emergency department.        Relevant Orders   MR Brain Wo Contrast   MR Cervical Spine Wo Contrast   No orders of the defined types were placed in this encounter.   Orders Placed This Encounter  Procedures   MR Brain Wo Contrast   MR Cervical Spine Wo Contrast     Follow-up: No follow-ups on file.  An After Visit Summary was printed and given to the patient.  [name], MD [facility] [phone]

## 2022-02-02 NOTE — ED Provider Notes (Signed)
[facility] [facility] EMERGENCY DEPARTMENT Provider Note   CSN: [id number] Arrival date & time: 02/02/22  1424     History  No chief complaint on file.   John Kemp is a 76 y.o. male.  HPI  This patient is a 76 year old male, he has a history of high cholesterol as well as atrial fibrillation on Eliquis and amiodarone.  The patient is treated for hypertension.  He is not a diabetic and denies having a stroke.  He does have a history of heart disease and has undergone cardiac bypass grafting in the past.  He presents to the hospital today with a complaint of dizziness.  The dizziness is somewhat difficult to explain but he reports that at least 7 different episodes over the last 2 months have left him with an intense feeling of disequilibrium where he feels like he cannot walk without holding onto things stumbling to the left and to the right, he denies any vertigo with this and as the episode goes on he eventually becomes severely diaphoretic nauseated vomit several times and then it resolves.  It usually last around 3 hours.  It occurred again yesterday prompting him to go to the urgent care.  They recommended he go to the emergency department.  Ultimately the patient called his doctor's office today who told him to go immediately to the ER to be evaluated with an MRI.  The patient denies any chest pain or shortness of breath with this, he thinks that his vision may be off when it occurs as well but he is unclear as he is not having the symptoms at this time.  There is no palpitations, no swelling of the legs, no weakness or numbness of the arms of the legs or the face and no difficulty with speech.  Home Medications Prior to Admission medications   Medication Sig Start Date End Date Taking? Authorizing Provider  amiodarone (PACERONE) 200 MG tablet Take 1 tablet (200 mg total) by mouth daily. 01/27/22   [name], MD  amLODipine (NORVASC) 5 MG tablet Take 1 tablet (5 mg  total) by mouth daily. 11/01/21   [name], MD  atorvastatin (LIPITOR) 10 MG tablet TAKE 1 TABLET BY MOUTH EVERYDAY AT BEDTIME Patient taking differently: Take 10 mg by mouth at bedtime. 11/11/21   Cox, Kirsten, MD  ELIQUIS 5 MG TABS tablet TAKE 1 TABLET BY MOUTH TWICE A DAY Patient taking differently: Take 5 mg by mouth 2 (two) times daily. 08/09/21   Cox, Elnita Maxwell, MD  finasteride (PROSCAR) 5 MG tablet Take 1 tablet (5 mg total) by mouth daily. 12/31/21   Cox, Elnita Maxwell, MD  fluticasone (FLONASE) 50 MCG/ACT nasal spray Place 2 sprays into both nostrils daily. 11/16/21   Cox, Elnita Maxwell, MD  furosemide (LASIX) 40 MG tablet Take 40 mg by mouth daily as needed for fluid or edema.    [provider]  Inulin (FIBER CHOICE PO) Take 3 tablets by mouth daily.    [provider]  levothyroxine (SYNTHROID) 112 MCG tablet Take 1 tablet (112 mcg total) by mouth daily before breakfast. 12/28/21   Cox, Kirsten, MD  montelukast (SINGULAIR) 10 MG tablet Take 1 tablet (10 mg total) by mouth at bedtime. Patient taking differently: Take 10 mg by mouth at bedtime as needed (allergies). 11/16/21   Cox, Elnita Maxwell, MD  nitroGLYCERIN (NITROSTAT) 0.4 MG SL tablet Place 0.4 mg under the tongue every 5 (five) minutes as needed for chest pain.    [provider]  ondansetron (ZOFRAN) 4 MG tablet Take 1 tablet (4 mg total) by mouth every 8 (eight) hours as needed for nausea or vomiting. Patient not taking: Reported on 02/02/2022 07/26/21   CoxElnita Maxwell, MD  potassium chloride (KLOR-CON M) 10 MEQ tablet Take 10 mEq by mouth daily as needed (take with furosemide).    [provider]  tamsulosin (FLOMAX) 0.4 MG CAPS capsule Take 2 capsules (0.8 mg total) by mouth daily. 12/24/21   Cox, Elnita Maxwell, MD  valsartan (DIOVAN) 320 MG tablet TAKE ONE TABLET BY MOUTH DAILY Patient taking differently: Take 320 mg by mouth daily. 12/21/20   Cox, Elnita Maxwell, MD  zolpidem (AMBIEN) 10 MG tablet Take 1 tablet (10 mg total) by  mouth at bedtime. 12/27/21   [name], MD      Allergies    Avelox [moxifloxacin hcl], Cartia xt [diltiazem hcl], Pravachol [pravastatin sodium], Prednisone, and Zetia [ezetimibe]    Review of Systems   Review of Systems  All other systems reviewed and are negative.  Physical Exam Updated Vital Signs BP 128/69   Pulse 61   Temp 98 F (36.7 C)   Resp 16   SpO2 98%  Physical Exam Vitals and nursing note reviewed.  Constitutional:      General: He is not in acute distress.    Appearance: He is well-developed.  HENT:     Head: Normocephalic and atraumatic.     Mouth/Throat:     Pharynx: No oropharyngeal exudate.  Eyes:     General: No scleral icterus.       Right eye: No discharge.        Left eye: No discharge.     Conjunctiva/sclera: Conjunctivae normal.     Pupils: Pupils are equal, round, and reactive to light.  Neck:     Thyroid: No thyromegaly.     Vascular: Carotid bruit present. No JVD.     Comments: Carotid bruit auscultated on the right Cardiovascular:     Rate and Rhythm: Normal rate and regular rhythm.     Heart sounds: Normal heart sounds. No murmur heard.   No friction rub. No gallop.  Pulmonary:     Effort: Pulmonary effort is normal. No respiratory distress.     Breath sounds: Normal breath sounds. No wheezing or rales.  Abdominal:     General: Bowel sounds are normal. There is no distension.     Palpations: Abdomen is soft. There is no mass.     Tenderness: There is no abdominal tenderness.  Musculoskeletal:        General: No tenderness. Normal range of motion.     Cervical back: Normal range of motion and neck supple.     Right lower leg: No edema.     Left lower leg: No edema.  Lymphadenopathy:     Cervical: No cervical adenopathy.  Skin:    General: Skin is warm and dry.     Findings: No erythema or rash.  Neurological:     Mental Status: He is alert.     Coordination: Coordination normal.     Comments: The patient has no pronator drift,  he has no disequilibrium, he has normal finger-nose-finger, normal strength in all 4 extremities, facial symmetry is normal, cranial nerves III through XII are normal  Psychiatric:        Behavior: Behavior normal.    ED Results / Procedures / Treatments   Labs (all labs ordered are listed, but only abnormal results are displayed)  Labs Reviewed  APTT - Abnormal; Notable for the following components:      Result Value   aPTT 37 (*)    All other components within normal limits  COMPREHENSIVE METABOLIC PANEL - Abnormal; Notable for the following components:   Glucose, Bld 123 (*)    Creatinine, Ser 1.44 (*)    Total Bilirubin 1.3 (*)    GFR, Estimated 51 (*)    All other components within normal limits  RESP PANEL BY RT-PCR (FLU A&B, COVID) ARPGX2  ETHANOL  PROTIME-INR  CBC  DIFFERENTIAL  RAPID URINE DRUG SCREEN, HOSP PERFORMED  URINALYSIS, ROUTINE W REFLEX MICROSCOPIC  HEMOGLOBIN A1C  MAGNESIUM    EKG EKG Interpretation  Date/Time:  Wednesday February 02 2022 15:14:55 EDT Ventricular Rate:  54 PR Interval:  168 QRS Duration: 116 QT Interval:  468 QTC Calculation: 443 R Axis:   5 Text Interpretation: Sinus bradycardia Minimal voltage criteria for LVH, may be normal variant ( R in aVL ) Inferior infarct , age undetermined Anterolateral infarct , age undetermined Abnormal ECG When compared with ECG of 29-Nov-2021 03:32, PREVIOUS ECG IS PRESENT unchanged from prior Confirmed by Noemi Chapel [id number]) on 02/02/2022 7:24:10 PM  Radiology CT Head Wo Contrast  Result Date: 02/02/2022 CLINICAL DATA:  Dizziness, persistent/recurrent, cardiac or vascular cause suspected EXAM: CT HEAD WITHOUT CONTRAST TECHNIQUE: Contiguous axial images were obtained from the base of the skull through the vertex without intravenous contrast. RADIATION DOSE REDUCTION: This exam was performed according to the departmental dose-optimization program which includes automated exposure control, adjustment of the mA  and/or kV according to patient size and/or use of iterative reconstruction technique. COMPARISON:  CT head 11/30/2021 BRAIN: BRAIN Cerebral ventricle sizes are concordant with the degree of cerebral volume loss. Patchy and confluent areas of decreased attenuation are noted throughout the deep and periventricular white matter of the cerebral hemispheres bilaterally, compatible with chronic microvascular ischemic disease. No evidence of large-territorial acute infarction. No parenchymal hemorrhage. No mass lesion. No extra-axial collection. No mass effect or midline shift. No hydrocephalus. Basilar cisterns are patent. Vascular: No hyperdense vessel. Atherosclerotic calcifications are present within the cavernous internal carotid and vertebral arteries. Skull: No acute fracture or focal lesion. Sinuses/Orbits: Paranasal sinuses and mastoid air cells are clear. Bilateral lens replacement. The orbits are unremarkable. Other: None. IMPRESSION: No acute intracranial abnormality. Electronically Signed   By: Iven Finn M.D.   On: 02/02/2022 16:55    Procedures Procedures    Medications Ordered in ED Medications  sodium chloride 0.9 % bolus 1,000 mL (has no administration in time range)    ED Course/ Medical Decision Making/ A&P Clinical Course as of 02/02/22 2212  Wed Feb 02, 2022  2040 I discussed the care with the neurologist on-call, Dr. Curly Shores who has been kind enough to come see the patient in consultaiton to help determine testing [BM]    Clinical Course User Index [BM] Noemi Chapel, MD                           Medical Decision Making Amount and/or Complexity of Data Reviewed Labs: ordered.   This patient presents to the ED for concern of disequilibrium and dizziness, this involves an extensive number of treatment options, and is a complaint that carries with it a high risk of complications and morbidity.  The differential diagnosis includes stroke, vertigo, cardiac sources, potential  TIA   Co morbidities that complicate the patient evaluation  Hypertension,  atrial fibrillation, anticoagulated   Additional history obtained:  Additional history obtained from significant other at the bedside External records from outside source obtained and reviewed including prior imaging including CT scans performed in the last few months   Lab Tests:  I Ordered, and personally interpreted labs.  The pertinent results include: Unremarkable overall   Imaging Studies ordered:  I ordered imaging studies including CT scan of the brain without contrast I independently visualized and interpreted imaging which showed no acute findings I agree with the radiologist interpretation   Cardiac Monitoring: / EKG:  The patient was maintained on a cardiac monitor.  I personally viewed and interpreted the cardiac monitored which showed an underlying rhythm of: Normal sinus rhythm   Consultations Obtained:  I requested consultation with the neuro hospitalist,  and discussed lab and imaging findings as well as pertinent plan - they recommend: d/c - stop the aricept as it will cause bradycardia - plenty of fluids, cardiac monitoring outpatient again - evidently has had several episodes of mild hypoglycemia - needs to check regularly   Problem List / ED Course / Critical interventions / Medication management  No episodes here,  Advised to stop donepizil due to risk for bradycardia. Reevaluation of the patient after these medicines showed that the patient improved I have reviewed the patients home medicines and have made adjustments as needed   Social Determinants of Health:  None   Test / Admission - Considered:  Considered MRI and admission however putting this patient under anesthesia with the medications that he takes has mild bradycardia seems an unreasonable risk especially since his symptoms of recurrent episodes lasting about 3 hours with a similar pattern do not seem to be  consistent with having strokelike symptoms.  After discussion with neurology they recommend outpatient follow-up, cardiac monitoring, stopping donepezil, the patient is agreeable to the plan and wants to go home.  His vital signs been normal, he has a normal mental status, stable for discharge         Final Clinical Impression(s) / ED Diagnoses Final diagnoses:  Near syncope  Ataxia    Rx / DC Orders ED Discharge Orders     None         Noemi Chapel, MD 02/02/22 2220

## 2022-02-02 NOTE — ED Triage Notes (Signed)
Pt reports seven episodes of "drunken spells" in the last three months, last one yesterday. Pt denies dizziness, reports feeling "swimmy headed" and then eventually vomits. Episodes last about three hours. Sent here by PCP for MRI/stroke workup, but d/t patient's claustrophobia must have MRI under anesthesia per his PCP.

## 2022-02-02 NOTE — Discharge Instructions (Addendum)
Stop taking donepezil immediately Talk to your doctor about wearing a cardiac monitor for another month Check your blood sugar every time you have an episode where you feel like this is happening, write this number down to share with your doctor Drink plenty of clear liquids Return to the emergency department for severe or worsening symptoms You will need to see your doctor in the next 3 days for recheck

## 2022-02-03 NOTE — Consult Note (Signed)
Neurology Consultation Reason for Consult: Dizzy spells Requesting Physician: Noemi Chapel  CC: Episodic dizziness  History is obtained from: Patient, wife at bedside, and chart review  HPI: John Kemp is a 76 y.o. ambidextrous man with a past medical history significant for atrial fibrillation on Eliquis, coronary artery disease s/p CABG, BMI 32, hypertension, hyperlipidemia, hypothyroidism.  He provides a somewhat inconsistent history.  He reports that 2 months ago he began to have episodic spells.  This is described as gradual onset of nausea followed by diaphoresis and emesis lasting approximately 3 hours at a time.  Patient and wife initially reported that the episodes initially happened approximately 2 weeks apart but more recently have happened 2 days apart.  However they report that he was on a event monitor for 30 days without capturing an event.  He notes his symptoms during an event are improved with sitting still and worsened with movement.  They have not checked his vital signs during the episode except for once at which time they noted his heart rate was in the 60s and oxygen saturations were 95%.  1 time postevent they checked glucose and found it to be low at 63.  There is no particular time of day or triggering events that they can offer on questioning.  They do note that the patient stopped his Coreg 2 months ago secondary to bradycardia.  They do not know whether there has been any significant benefit from donepezil.  On full review of questions they additionally note that the patient has had some mild tremor in the bilateral upper extremities for about 6 months.  He has chronic inability to see in the bilateral upper quadrants per his report (right upper quadrant on my examination), which on chart review has been noted to be secondary to his prior strokes.  He notes he has had 2 prior MRIs which have required general anesthesia due to his inability to tolerate scan secondary to  claustrophobia  ROS: All other review of systems was negative except as noted in the HPI.  Past Medical History:  Diagnosis Date   A-fib ([place])    Acquired thrombophilia ([place]) 11/17/2019   Acute bronchitis due to other specified organisms 10/28/2021   Acute cough 10/20/2021   Acute non-recurrent maxillary sinusitis 10/28/2021   Anticoagulated 01/21/2015   Atherosclerotic heart disease of native coronary artery without angina pectoris    Atrial fibrillation with rapid ventricular response ([place]) 11/12/2021   Atrial flutter with rapid ventricular response ([place]) 11/12/2021   CAD in native artery 01/21/2015   Cancer ([facility])    skin cancer   CHF (congestive heart failure) ([place]) 11/05/2019   Chronic diastolic (congestive) heart failure ([facility])    Chronic diastolic heart failure ([place]) 01/21/2015   Last Assessment & Plan:  Is congestive heart failure is mildly decompensated he is edematous he'll increase the dose of his diuretic today the lab work performed including an ARB referred to care transitions for home care he'll follow-up in my office with me in 4-6 weeks daily weights sodium restrictionand compliance of medications beinghemoglobin of will   Chronic obstructive pulmonary disease (COPD) ([place])    Coronary artery disease involving autologous artery coronary bypass graft 11/12/2021   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 11/17/2019   Dizziness 11/12/2021   Dyslipidemia 11/12/2021   Elevated brain natriuretic peptide (BNP) level 11/12/2021   Essential hypertension 01/21/2015   right superior quadrantanopsia   Hearing loss of right ear due to cerumen impaction 10/20/2021  High cholesterol 10/20/2017   Hx of CABG 03/02/2017   Hypertension    Hypertensive heart disease with heart failure ([place])    Hypothyroidism (acquired) 04/24/2018   Impaired fasting glucose    Left rotator cuff tear 11/12/2021   Mixed hyperlipidemia 11/17/2019   Non-seasonal allergic rhinitis due to pollen 10/20/2021    On amiodarone therapy 01/21/2015   PAF (paroxysmal atrial fibrillation) (HCC)    Primary insomnia    Sequela of cardioembolic stroke 0/05/9322   Severe obesity with body mass index (BMI) of 36.0 to 36.9 with serious comorbidity ([place])    Squamous cell carcinoma 11/05/2019   Typical atrial flutter ([place]) 01/21/2015   Vitamin D deficiency, unspecified    Past Surgical History:  Procedure Laterality Date   APPENDECTOMY  1956   CARDIAC CATHETERIZATION     CARDIOVERSION  2002   CHOLECYSTECTOMY  2006   CORONARY ARTERY BYPASS GRAFT  2001   HERNIA REPAIR     RADIOLOGY WITH ANESTHESIA Bilateral 01/18/2018   Procedure: MRI WITH ANESTHESIA LUMBAR SPINE WITH AND WITHOUT CONTRAST;  Surgeon: Radiologist, Medication, MD;  Location: [facility];  Service: Radiology;  Laterality: Bilateral;   ROTATOR CUFF REPAIR Left    TONSILLECTOMY  1975   Current Outpatient Medications  Medication Instructions   amiodarone (PACERONE) 200 mg, Oral, Daily   amLODipine (NORVASC) 5 mg, Oral, Daily   atorvastatin (LIPITOR) 10 MG tablet TAKE 1 TABLET BY MOUTH EVERYDAY AT BEDTIME   ELIQUIS 5 MG TABS tablet TAKE 1 TABLET BY MOUTH TWICE A DAY   finasteride (PROSCAR) 5 mg, Oral, Daily   fluticasone (FLONASE) 50 MCG/ACT nasal spray 2 sprays, Each Nare, Daily   furosemide (LASIX) 40 mg, Oral, Daily PRN   Inulin (FIBER CHOICE PO) 3 tablets, Oral, Daily   levothyroxine (SYNTHROID) 112 mcg, Oral, Daily before breakfast   montelukast (SINGULAIR) 10 mg, Oral, Daily at bedtime   nitroGLYCERIN (NITROSTAT) 0.4 mg, Sublingual, Every 5 min PRN   ondansetron (ZOFRAN) 4 mg, Oral, Every 8 hours PRN   potassium chloride (KLOR-CON M) 10 MEQ tablet 10 mEq, Oral, Daily PRN   tamsulosin (FLOMAX) 0.8 mg, Oral, Daily   valsartan (DIOVAN) 320 MG tablet TAKE ONE TABLET BY MOUTH DAILY   zolpidem (AMBIEN) 10 mg, Oral, Daily at bedtime     Family History  Problem Relation Age of Onset   Heart disease Father    Arrhythmia Father    Breast cancer  Mother    Diabetes type II Sister    Diabetes Brother     Social History:  reports that he quit smoking about 43 years ago. His smoking use included cigarettes. He has a 15.00 pack-year smoking history. He has been exposed to tobacco smoke. He has never used smokeless tobacco. He reports that he does not drink alcohol and does not use drugs.   Exam: Current vital signs: BP (!) 161/79   Pulse (!) 52   Temp 98 F (36.7 C)   Resp 18   SpO2 100%  Vital signs in last 24 hours: Temp:  [97.5 F (36.4 C)-98.9 F (37.2 C)] 98 F (36.7 C) (06/07 2233) Pulse Rate:  [52-61] 52 (06/07 2233) Resp:  [16-18] 18 (06/07 2233) BP: (125-161)/(62-79) 161/79 (06/07 2233) SpO2:  [98 %-100 %] 100 % (06/07 2233) Weight:  [102.1 kg] 102.1 kg (06/07 1143)   Physical Exam  Constitutional: Appears well-developed and well-nourished.  Psych: Affect appropriate to situation, calm and cooperative Eyes: No scleral injection HENT: No oropharyngeal obstruction.  MSK: no joint deformities.  Cardiovascular: Irregularly irregular Respiratory: Effort normal, non-labored breathing GI: Soft.  No distension. There is no tenderness.  Skin: Warm dry and intact visible skin  Neuro: Mental Status: Patient is awake, alert, oriented to person, place, month, year (self corrects after initially stating the wrong year), and situation. Patient is able to give a clear and coherent history. No signs of aphasia or neglect Cranial Nerves: II: Visual Fields are notable for right upper quadrantanopia. Pupils are equal, round, and reactive to light.   III,IV, VI: EOMI without ptosis or diploplia.  V: Facial sensation is symmetric to temperature VII: Facial movement is symmetric.  VIII: hearing is intact to voice X: Uvula elevates symmetrically XI: Shoulder shrug is symmetric. XII: tongue is midline without atrophy or fasciculations.  Motor: 5/5 strength was present in all four extremities.  He does have a bilateral upper  extremity tremor, right worse than left.  Finger tapping recommends most notably on the left more than the right hand.  Arrhythmic tapping in all 4 extremities Sensory: Sensation is symmetric to light touch and temperature in the arms and legs. Deep Tendon Reflexes: 3+ and symmetric in the brachioradialis and patellae.  Cerebellar: FNF testing reveals a mild intention tremor and HKS is intact bilaterally Gait:  Deferred    I have reviewed labs in epic and the results pertinent to this consultation are:  Basic Metabolic Panel: Recent Labs  Lab 02/02/22 1515  NA 138  K 3.7  CL 106  CO2 25  GLUCOSE 123*  BUN 18  CREATININE 1.44*  CALCIUM 9.1    CBC: Recent Labs  Lab 02/02/22 1515  WBC 7.1  NEUTROABS 4.6  HGB 14.0  HCT 42.2  MCV 94.6  PLT 202    Coagulation Studies: Recent Labs    02/02/22 1515  LABPROT 14.4  INR 1.1      I have reviewed the images obtained:  Head CT personally reviewed, agree with radiology negative for acute intracranial process and stable from prior   Impression: This is a 76 year old man with past medical history as above presenting with recurrent dizziness episodes.  I do not suspect a primarily neurological nature given that the episodes are stereotyped with resolution and description very atypical for seizure.  Given his low heart rate I do query the possibility of symptomatic bradycardia, or a multifactorial stable response given he has an AKI and elevated glucose today.  However donepezil can also be associated with bradycardia and therefore I would recommend discontinuing this medication given his minimal cognitive symptoms at this time  Recommendations: -Stop donepezil -Close outpatient follow-up with neurology -Repeay outpatient cardiac event monitoring to capture an event -Glucose checks during events   Lesleigh Noe MD-PhD [facility] [phone] Available 7 AM to 7 PM, outside these hours please contact  Neurologist on call listed on AMION

## 2022-02-08 ENCOUNTER — Other Ambulatory Visit: Payer: Medicare Other

## 2022-02-09 ENCOUNTER — Telehealth: Payer: Self-pay

## 2022-02-09 ENCOUNTER — Ambulatory Visit (INDEPENDENT_AMBULATORY_CARE_PROVIDER_SITE_OTHER): Payer: Medicare Other | Admitting: Family Medicine

## 2022-02-09 VITALS — BP 124/58 | HR 60 | Temp 97.3°F | Resp 18 | Ht 70.0 in | Wt 226.0 lb

## 2022-02-09 DIAGNOSIS — E785 Hyperlipidemia, unspecified: Secondary | ICD-10-CM | POA: Diagnosis not present

## 2022-02-09 DIAGNOSIS — R42 Dizziness and giddiness: Secondary | ICD-10-CM | POA: Diagnosis not present

## 2022-02-09 DIAGNOSIS — I10 Essential (primary) hypertension: Secondary | ICD-10-CM | POA: Diagnosis not present

## 2022-02-09 DIAGNOSIS — M542 Cervicalgia: Secondary | ICD-10-CM | POA: Diagnosis not present

## 2022-02-09 DIAGNOSIS — I5032 Chronic diastolic (congestive) heart failure: Secondary | ICD-10-CM | POA: Diagnosis not present

## 2022-02-09 DIAGNOSIS — I6523 Occlusion and stenosis of bilateral carotid arteries: Secondary | ICD-10-CM

## 2022-02-09 MED ORDER — DIAZEPAM 2 MG PO TABS: ORAL_TABLET | ORAL | 0 refills | Status: DC

## 2022-02-09 NOTE — Telephone Encounter (Signed)
Patient called stating he took the valium as directed one tablet 1 hour ago ("could not tell it did anything") took the second dose 30 minutes later ("Still can not tell it has done anything"). States he was advised to contact our office if this was to happen, he questioned what does he need to do now since he is suppose to have a MRI?

## 2022-02-09 NOTE — Progress Notes (Unsigned)
Subjective:  Patient ID: John Kemp, male    DOB: Oct 23, 1945  Age: 76 y.o. MRN: [id number]  Chief Complaint  Patient presents with   Dizziness    HPI [name] comes in for ED follow-up.  He was seen at [facility] ED on 02/02/2022.  He was evaluated for his 7 different episodes of dizziness over the last 2 months.  MRI was considered however putting the patient under anesthesia with his current medications and his bradycardia was considered to be an unreasonable risk at this time.  He was seen and evaluated by neurology and recommended to stop donepezil.    Current Outpatient Medications on File Prior to Visit  Medication Sig Dispense Refill   amiodarone (PACERONE) 200 MG tablet Take 1 tablet (200 mg total) by mouth daily. 90 tablet 2   amLODipine (NORVASC) 5 MG tablet Take 1 tablet (5 mg total) by mouth daily. 90 tablet 1   atorvastatin (LIPITOR) 10 MG tablet TAKE 1 TABLET BY MOUTH EVERYDAY AT BEDTIME (Patient taking differently: Take 10 mg by mouth at bedtime.) 90 tablet 1   ELIQUIS 5 MG TABS tablet TAKE 1 TABLET BY MOUTH TWICE A DAY (Patient taking differently: Take 5 mg by mouth 2 (two) times daily.) 180 tablet 1   finasteride (PROSCAR) 5 MG tablet Take 1 tablet (5 mg total) by mouth daily. 90 tablet 1   fluticasone (FLONASE) 50 MCG/ACT nasal spray Place 2 sprays into both nostrils daily. 16 g 6   furosemide (LASIX) 40 MG tablet Take 40 mg by mouth daily as needed for fluid or edema.     Inulin (FIBER CHOICE PO) Take 3 tablets by mouth daily.     levothyroxine (SYNTHROID) 112 MCG tablet Take 1 tablet (112 mcg total) by mouth daily before breakfast. 60 tablet 0   montelukast (SINGULAIR) 10 MG tablet Take 1 tablet (10 mg total) by mouth at bedtime. (Patient taking differently: Take 10 mg by mouth at bedtime as needed (allergies).) 30 tablet 5   nitroGLYCERIN (NITROSTAT) 0.4 MG SL tablet Place 0.4 mg under the tongue every 5 (five) minutes as needed for chest pain.      ondansetron (ZOFRAN) 4 MG tablet Take 1 tablet (4 mg total) by mouth every 8 (eight) hours as needed for nausea or vomiting. (Patient not taking: Reported on 02/02/2022) 30 tablet 0   potassium chloride (KLOR-CON M) 10 MEQ tablet Take 10 mEq by mouth daily as needed (take with furosemide).     tamsulosin (FLOMAX) 0.4 MG CAPS capsule Take 2 capsules (0.8 mg total) by mouth daily. 180 capsule 1   valsartan (DIOVAN) 320 MG tablet TAKE ONE TABLET BY MOUTH DAILY (Patient taking differently: Take 320 mg by mouth daily.) 90 tablet 3   zolpidem (AMBIEN) 10 MG tablet Take 1 tablet (10 mg total) by mouth at bedtime. 30 tablet 3   No current facility-administered medications on file prior to visit.   Past Medical History:  Diagnosis Date   A-fib [facility])    Acquired thrombophilia ([place]) 11/17/2019   Acute bronchitis due to other specified organisms 10/28/2021   Acute cough 10/20/2021   Acute non-recurrent maxillary sinusitis 10/28/2021   Anticoagulated 01/21/2015   Atherosclerotic heart disease of native coronary artery without angina pectoris    Atrial fibrillation with rapid ventricular response ([place]) 11/12/2021   Atrial flutter with rapid ventricular response ([place]) 11/12/2021   CAD in native artery 01/21/2015   Cancer (HCC)    skin cancer   CHF (  congestive heart failure) (HCC) 11/05/2019   Chronic diastolic (congestive) heart failure (HCC)    Chronic diastolic heart failure ([place]) 01/21/2015   Last Assessment & Plan:  Is congestive heart failure is mildly decompensated he is edematous he'll increase the dose of his diuretic today the lab work performed including an ARB referred to care transitions for home care he'll follow-up in my office with me in 4-6 weeks daily weights sodium restrictionand compliance of medications beinghemoglobin of will   Chronic obstructive pulmonary disease (COPD) ([place])    Coronary artery disease involving autologous artery coronary bypass graft 11/12/2021   Disturbances of vision due  to cerebrovascular disease 11/17/2019   Dizziness 11/12/2021   Dyslipidemia 11/12/2021   Elevated brain natriuretic peptide (BNP) level 11/12/2021   Essential hypertension 01/21/2015   right superior quadrantanopsia   Hearing loss of right ear due to cerumen impaction 10/20/2021   High cholesterol 10/20/2017   Hx of CABG 03/02/2017   Hypertension    Hypertensive heart disease with heart failure ([place])    Hypothyroidism (acquired) 04/24/2018   Impaired fasting glucose    Left rotator cuff tear 11/12/2021   Mixed hyperlipidemia 11/17/2019   Non-seasonal allergic rhinitis due to pollen 10/20/2021   On amiodarone therapy 01/21/2015   PAF (paroxysmal atrial fibrillation) (HCC)    Primary insomnia    [place]ela of cardioembolic stroke 9/32/6712   Severe obesity with body mass index (BMI) of 36.0 to 36.9 with serious comorbidity ([place])    Squamous cell carcinoma 11/05/2019   Typical atrial flutter ([place]) 01/21/2015   Vitamin D deficiency, unspecified    Past Surgical History:  Procedure Laterality Date   APPENDECTOMY  1956   CARDIAC CATHETERIZATION     CARDIOVERSION  2002   CHOLECYSTECTOMY  2006   CORONARY ARTERY BYPASS GRAFT  2001   HERNIA REPAIR     RADIOLOGY WITH ANESTHESIA Bilateral 01/18/2018   Procedure: MRI WITH ANESTHESIA LUMBAR SPINE WITH AND WITHOUT CONTRAST;  Surgeon: Radiologist, Medication, MD;  Location: [place];  Service: Radiology;  Laterality: Bilateral;   ROTATOR CUFF REPAIR Left    TONSILLECTOMY  1975    Family History  Problem Relation Age of Onset   Heart disease Father    Arrhythmia Father    Breast cancer Mother    Diabetes type II Sister    Diabetes Brother    Social History   Socioeconomic History   Marital status: Married    Spouse name: Not on file   Number of children: Not on file   Years of education: Not on file   Highest education level: Not on file  Occupational History   Occupation: Retired  Tobacco Use   Smoking status: Former    Packs/day: 1.00     Years: 15.00    Total pack years: 15.00    Types: Cigarettes    Quit date: 08/29/1978    Years since quitting: 43.4    Passive exposure: Past   Smokeless tobacco: Never  Vaping Use   Vaping Use: Never used  Substance and Sexual Activity   Alcohol use: No    Alcohol/week: 0.0 standard drinks of alcohol   Drug use: No   Sexual activity: Not Currently  Other Topics Concern   Not on file  Social History Narrative   Not on file   Social Determinants of Health   Financial Resource Strain: High Risk (01/28/2022)   Overall Financial Resource Strain (CARDIA)    Difficulty of Paying Living Expenses: Hard  Food Insecurity:  No Food Insecurity (12/07/2020)   Hunger Vital Sign    Worried About Running Out of Food in the Last Year: Never true    Ran Out of Food in the Last Year: Never true  Transportation Needs: No Transportation Needs (01/28/2022)   PRAPARE - Hydrologist (Medical): No    Lack of Transportation (Non-Medical): No  Physical Activity: Not on file  Stress: Not on file  Social Connections: Not on file    Review of Systems  Constitutional:  Positive for fatigue. Negative for chills and fever.  HENT:  Negative for congestion, rhinorrhea and sore throat.   Respiratory:  Negative for cough and shortness of breath.   Cardiovascular:  Negative for chest pain and palpitations.  Gastrointestinal:  Negative for abdominal pain, constipation, diarrhea, nausea and vomiting.  Genitourinary:  Negative for dysuria and urgency.  Musculoskeletal:  Positive for neck pain. Negative for arthralgias, back pain and myalgias.  Neurological:  Positive for dizziness and headaches.  Psychiatric/Behavioral:  Negative for dysphoric mood. The patient is not nervous/anxious.      Objective:  There were no vitals taken for this visit.     02/02/2022   10:33 PM 02/02/2022    5:54 PM 02/02/2022    3:02 PM  BP/Weight  Systolic BP [phone]  Diastolic BP 79 69 62     Physical Exam Vitals reviewed.  Constitutional:      Appearance: Normal appearance. He is obese.  Neck:     Vascular: No carotid bruit.  Cardiovascular:     Rate and Rhythm: Normal rate and regular rhythm.     Heart sounds: Normal heart sounds.  Pulmonary:     Effort: Pulmonary effort is normal.     Breath sounds: Normal breath sounds. No wheezing, rhonchi or rales.  Abdominal:     General: Bowel sounds are normal.     Palpations: Abdomen is soft.     Tenderness: There is no abdominal tenderness.  Neurological:     Mental Status: He is alert.  Psychiatric:        Mood and Affect: Mood normal.        Behavior: Behavior normal.     Diabetic Foot Exam - Simple   No data filed      Lab Results  Component Value Date   WBC 6.2 02/09/2022   HGB 14.4 02/09/2022   HCT 41.8 02/09/2022   PLT 173 02/09/2022   GLUCOSE 81 02/09/2022   CHOL 143 02/09/2022   TRIG 92 02/09/2022   HDL 48 02/09/2022   LDLCALC 78 02/09/2022   ALT 19 02/09/2022   AST 21 02/09/2022   NA 141 02/09/2022   K 4.5 02/09/2022   CL 101 02/09/2022   CREATININE 1.19 02/09/2022   BUN 17 02/09/2022   CO2 25 02/09/2022   TSH 1.080 12/24/2021   INR 1.1 02/02/2022   HGBA1C 5.4 04/23/2021   MICROALBUR Positive 150 01/23/2019      Assessment & Plan:   Problem List Items Addressed This Visit       Cardiovascular and Mediastinum   Essential hypertension   Relevant Orders   CBC with Differential/Platelet (Completed)   Comprehensive metabolic panel (Completed)   Chronic diastolic heart failure ([place])   Relevant Orders   ECHOCARDIOGRAM COMPLETE (Completed)     Other   Dyslipidemia   Relevant Orders   CBC with Differential/Platelet (Completed)   Comprehensive metabolic panel (Completed)   Lipid panel (Completed)  Other Visit Diagnoses     Dizziness    -  Primary   Relevant Orders   ECHOCARDIOGRAM COMPLETE (Completed)   Bilateral carotid artery stenosis       Relevant Orders   US Carotid  Bilateral   Neck pain         .  Meds ordered this encounter  Medications   diazepam (VALIUM) 2 MG tablet    Sig: One oral twice daily as needed    Dispense:  10 tablet    Refill:  0    Orders Placed This Encounter  Procedures   US Carotid Bilateral   CBC with Differential/Platelet   Comprehensive metabolic panel   Lipid panel   Cardiovascular Risk Assessment   ECHOCARDIOGRAM COMPLETE     Follow-up: No follow-ups on file.  An After Visit Summary was printed and given to the patient.  [name], MD [facility] [phone]

## 2022-02-10 ENCOUNTER — Other Ambulatory Visit: Payer: Self-pay

## 2022-02-10 DIAGNOSIS — R42 Dizziness and giddiness: Secondary | ICD-10-CM

## 2022-02-10 DIAGNOSIS — R519 Headache, unspecified: Secondary | ICD-10-CM

## 2022-02-10 DIAGNOSIS — R270 Ataxia, unspecified: Secondary | ICD-10-CM

## 2022-02-10 LAB — CBC WITH DIFFERENTIAL/PLATELET
Basophils Absolute: 0.1 10*3/uL (ref 0.0–0.2)
Basos: 1 %
EOS (ABSOLUTE): 0.1 10*3/uL (ref 0.0–0.4)
Eos: 1 %
Hematocrit: 41.8 % (ref 37.5–51.0)
Hemoglobin: 14.4 g/dL (ref 13.0–17.7)
Immature Grans (Abs): 0 10*3/uL (ref 0.0–0.1)
Immature Granulocytes: 0 %
Lymphocytes Absolute: 1.6 10*3/uL (ref 0.7–3.1)
Lymphs: 26 %
MCH: 32.2 pg (ref 26.6–33.0)
MCHC: 34.4 g/dL (ref 31.5–35.7)
MCV: 94 fL (ref 79–97)
Monocytes Absolute: 0.7 10*3/uL (ref 0.1–0.9)
Monocytes: 11 %
Neutrophils Absolute: 3.8 10*3/uL (ref 1.4–7.0)
Neutrophils: 61 %
Platelets: 173 10*3/uL (ref 150–450)
RBC: 4.47 x10E6/uL (ref 4.14–5.80)
RDW: 12.8 % (ref 11.6–15.4)
WBC: 6.2 10*3/uL (ref 3.4–10.8)

## 2022-02-10 LAB — LIPID PANEL
Chol/HDL Ratio: 3 ratio (ref 0.0–5.0)
Cholesterol, Total: 143 mg/dL (ref 100–199)
HDL: 48 mg/dL (ref >39)
LDL Chol Calc (NIH): 78 mg/dL (ref 0–99)
Triglycerides: 92 mg/dL (ref 0–149)
VLDL Cholesterol Cal: 17 mg/dL (ref 5–40)

## 2022-02-10 LAB — COMPREHENSIVE METABOLIC PANEL
ALT: 19 IU/L (ref 0–44)
AST: 21 IU/L (ref 0–40)
Albumin/Globulin Ratio: 2 (ref 1.2–2.2)
Albumin: 4.4 g/dL (ref 3.7–4.7)
Alkaline Phosphatase: 78 IU/L (ref 44–121)
BUN/Creatinine Ratio: 14 (ref 10–24)
BUN: 17 mg/dL (ref 8–27)
Bilirubin Total: 0.9 mg/dL (ref 0.0–1.2)
CO2: 25 mmol/L (ref 20–29)
Calcium: 9.1 mg/dL (ref 8.6–10.2)
Chloride: 101 mmol/L (ref 96–106)
Creatinine, Ser: 1.19 mg/dL (ref 0.76–1.27)
Globulin, Total: 2.2 g/dL (ref 1.5–4.5)
Glucose: 81 mg/dL (ref 70–99)
Potassium: 4.5 mmol/L (ref 3.5–5.2)
Sodium: 141 mmol/L (ref 134–144)
Total Protein: 6.6 g/dL (ref 6.0–8.5)
eGFR: 64 mL/min/{1.73_m2} (ref >59)

## 2022-02-10 LAB — CARDIOVASCULAR RISK ASSESSMENT

## 2022-02-11 ENCOUNTER — Telehealth: Payer: Self-pay

## 2022-02-11 NOTE — Telephone Encounter (Signed)
Mr John Kemp called back for follow-up on the valium.  He took the 6 mg of valium and felt somewhat drowsy but did not feel that he would be able to tolerate the MRI machine.

## 2022-02-13 NOTE — Telephone Encounter (Signed)
See other note. Dr Tobie Poet

## 2022-02-14 ENCOUNTER — Other Ambulatory Visit: Payer: Self-pay | Admitting: Family Medicine

## 2022-02-14 DIAGNOSIS — I6523 Occlusion and stenosis of bilateral carotid arteries: Secondary | ICD-10-CM | POA: Diagnosis not present

## 2022-02-14 DIAGNOSIS — R29898 Other symptoms and signs involving the musculoskeletal system: Secondary | ICD-10-CM

## 2022-02-14 DIAGNOSIS — R42 Dizziness and giddiness: Secondary | ICD-10-CM

## 2022-02-14 DIAGNOSIS — R270 Ataxia, unspecified: Secondary | ICD-10-CM

## 2022-02-14 NOTE — Telephone Encounter (Signed)
Ordered. Dr. Jamirah Zelaya  

## 2022-02-15 ENCOUNTER — Ambulatory Visit (INDEPENDENT_AMBULATORY_CARE_PROVIDER_SITE_OTHER): Payer: Medicare Other

## 2022-02-15 DIAGNOSIS — R42 Dizziness and giddiness: Secondary | ICD-10-CM | POA: Diagnosis not present

## 2022-02-15 DIAGNOSIS — I5032 Chronic diastolic (congestive) heart failure: Secondary | ICD-10-CM | POA: Diagnosis not present

## 2022-02-15 LAB — ECHOCARDIOGRAM COMPLETE
Area-P 1/2: 2.71 cm2
P 1/2 time: 667 msec
S' Lateral: 3.7 cm

## 2022-02-15 MED ORDER — PERFLUTREN LIPID MICROSPHERE
1.0000 mL | INTRAVENOUS | Status: AC | PRN
  Administered 2022-02-15: 5 mL via INTRAVENOUS

## 2022-02-16 ENCOUNTER — Telehealth: Payer: Self-pay

## 2022-02-16 NOTE — Telephone Encounter (Signed)
Patient returned call stating that prior to scheduling appt with a vascular surgeon he feels it will be best to wait on his ECHO results (test was yesterday) that way he "will know if needs more than one doctor involved with all of this"

## 2022-02-17 ENCOUNTER — Telehealth: Payer: Self-pay

## 2022-02-17 ENCOUNTER — Other Ambulatory Visit: Payer: Self-pay | Admitting: Family Medicine

## 2022-02-17 DIAGNOSIS — I6522 Occlusion and stenosis of left carotid artery: Secondary | ICD-10-CM

## 2022-02-17 NOTE — Telephone Encounter (Signed)
He returned call to also question if he should get the myelogram before going to surgeon.  John Kemp 02/17/22 3:38 PM

## 2022-02-17 NOTE — Telephone Encounter (Signed)
Patient notified that he should proceed with CT myleogram.

## 2022-02-19 ENCOUNTER — Other Ambulatory Visit: Payer: Self-pay | Admitting: Family Medicine

## 2022-02-19 ENCOUNTER — Other Ambulatory Visit: Payer: Medicare Other

## 2022-02-20 ENCOUNTER — Encounter: Payer: Self-pay | Admitting: Family Medicine

## 2022-02-20 DIAGNOSIS — M542 Cervicalgia: Secondary | ICD-10-CM | POA: Insufficient documentation

## 2022-02-20 DIAGNOSIS — I6523 Occlusion and stenosis of bilateral carotid arteries: Secondary | ICD-10-CM | POA: Insufficient documentation

## 2022-02-20 DIAGNOSIS — R42 Dizziness and giddiness: Secondary | ICD-10-CM | POA: Insufficient documentation

## 2022-02-20 HISTORY — DX: Cervicalgia: M54.2

## 2022-02-20 HISTORY — DX: Occlusion and stenosis of bilateral carotid arteries: I65.23

## 2022-02-20 NOTE — Assessment & Plan Note (Signed)
>>  ASSESSMENT AND PLAN FOR CHRONIC DIASTOLIC HEART FAILURE ([place]) WRITTEN ON 02/20/2022  8:31 PM BY COX, KIRSTEN, MD  Check echo.

## 2022-02-20 NOTE — Assessment & Plan Note (Addendum)
Check carotid Dopplers °

## 2022-02-20 NOTE — Assessment & Plan Note (Signed)
>>  ASSESSMENT AND PLAN FOR DYSLIPIDEMIA WRITTEN ON 02/20/2022  8:31 PM BY COX, KIRSTEN, MD  Well controlled.  No changes to medicines.  Continue to work on eating a healthy diet and exercise.  Labs drawn today.

## 2022-02-20 NOTE — Assessment & Plan Note (Signed)
Check labs. Get echo.  I am concerned he has cervical myelopathy.  Prescription for Valium given in order to try to allow him to get over his claustrophobia. He tried the Valium at home but this did not help his nerves.  Increase his dose it still did not help.  Therefore I have ordered a CT myelogram.

## 2022-02-21 ENCOUNTER — Ambulatory Visit
Admission: RE | Admit: 2022-02-21 | Discharge: 2022-02-21 | Disposition: A | Discharge status: Home / Self Care | Payer: Medicare Other | Source: Ambulatory Visit | Attending: Family Medicine | Admitting: Family Medicine

## 2022-02-21 DIAGNOSIS — R42 Dizziness and giddiness: Secondary | ICD-10-CM

## 2022-02-21 DIAGNOSIS — M4312 Spondylolisthesis, cervical region: Secondary | ICD-10-CM | POA: Diagnosis not present

## 2022-02-21 DIAGNOSIS — R270 Ataxia, unspecified: Secondary | ICD-10-CM | POA: Diagnosis not present

## 2022-02-21 DIAGNOSIS — R29898 Other symptoms and signs involving the musculoskeletal system: Secondary | ICD-10-CM

## 2022-02-21 DIAGNOSIS — M2578 Osteophyte, vertebrae: Secondary | ICD-10-CM | POA: Diagnosis not present

## 2022-02-21 DIAGNOSIS — M47812 Spondylosis without myelopathy or radiculopathy, cervical region: Secondary | ICD-10-CM | POA: Diagnosis not present

## 2022-02-21 MED ORDER — IOPAMIDOL (ISOVUE-M 300) INJECTION 61%
10.0000 mL | Freq: Once | INTRAMUSCULAR | Status: AC | PRN
  Administered 2022-02-21: 10 mL via INTRATHECAL

## 2022-02-21 MED ORDER — DIAZEPAM 5 MG PO TABS
5.0000 mg | ORAL_TABLET | Freq: Once | ORAL | Status: AC
  Administered 2022-02-21: 5 mg via ORAL

## 2022-02-23 ENCOUNTER — Other Ambulatory Visit: Payer: Self-pay

## 2022-02-24 ENCOUNTER — Other Ambulatory Visit: Payer: Self-pay | Admitting: Family Medicine

## 2022-02-24 DIAGNOSIS — M542 Cervicalgia: Secondary | ICD-10-CM

## 2022-02-25 DIAGNOSIS — I110 Hypertensive heart disease with heart failure: Secondary | ICD-10-CM | POA: Diagnosis not present

## 2022-02-25 DIAGNOSIS — E039 Hypothyroidism, unspecified: Secondary | ICD-10-CM | POA: Diagnosis not present

## 2022-02-25 DIAGNOSIS — I4891 Unspecified atrial fibrillation: Secondary | ICD-10-CM | POA: Diagnosis not present

## 2022-02-25 DIAGNOSIS — I5030 Unspecified diastolic (congestive) heart failure: Secondary | ICD-10-CM | POA: Diagnosis not present

## 2022-02-27 NOTE — Progress Notes (Signed)
Subjective:  Patient ID: John Kemp, male    DOB: 05-24-1946  Age: 76 y.o. MRN: [id number]  Chief Complaint  Patient presents with   Hypothyroidism   Hyperlipidemia   Hypertension    HPI Hypertension: Patient is taking Amlodipine 5 mg daily, Valsartan 320 mg daily.  Hypothyroidism: Taking Levothyroxine 112 mcg daily.  This was new dose following his last thyroid levels which were slightly abnormal.  Hyperlipidemia: Takes atorvastatin 10 mg daily. Recent LDL 79. Goal less than 55 due to atherosclerosis.   Afib: Patient is on Eliquis 5 mg twice a day and amiodarone.  Patient had light spell yesterday. Felt off balance for 15 minutes.  I was concerned about cervical stenosis being the etiology.  Patient cannot undergo an MRI due to severe claustrophobia.  Anesthesia would not agree to use general anesthesia to allow this test due to his other medical issues.  CT myelogram was done and showed arthritis and foraminal stenosis at several levels but no spinal stenosis.  Patient denies any radicular symptoms down his arms.  He does have cracking in his neck when he moves it.  Denies pain in his neck.  I have referred him to neurosurgery for further evaluation.  Patient also had a Holter monitor which was normal.  Recent echo showed mild stage II diastolic dysfunction otherwise was normal.  Patient has had numerous CT scans of his brain with no acute findings in the last 2 months.  Unable to undergo an MRI of either his neck or brain as stated above.  I even tried diazepam for sedation and it did not help significantly.  Carotid ultrasound  showed left carotid narrowing 50 to 69%.  Suggested possibly CTA for further evaluation however I opted to go ahead and refer to vascular surgery for their assessment.  Holter monitor done May 2023 showed normal sinus rhythm on low-dose amiodarone.  No source of presyncopal/dizziness found  Patient reports problems with his hearing in his right ear and  ringing in his right ear for the last month.  Requesting ENT referral which I think is very reasonable.  Current Outpatient Medications on File Prior to Visit  Medication Sig Dispense Refill   amiodarone (PACERONE) 200 MG tablet Take 1 tablet (200 mg total) by mouth daily. 90 tablet 2   amLODipine (NORVASC) 5 MG tablet Take 1 tablet (5 mg total) by mouth daily. 90 tablet 1   ELIQUIS 5 MG TABS tablet TAKE 1 TABLET BY MOUTH TWICE A DAY (Patient taking differently: Take 5 mg by mouth 2 (two) times daily.) 180 tablet 1   finasteride (PROSCAR) 5 MG tablet Take 1 tablet (5 mg total) by mouth daily. 90 tablet 1   fluticasone (FLONASE) 50 MCG/ACT nasal spray Place 2 sprays into both nostrils daily. 16 g 6   furosemide (LASIX) 40 MG tablet Take 40 mg by mouth daily as needed for fluid or edema.     Inulin (FIBER CHOICE PO) Take 3 tablets by mouth daily.     levothyroxine (SYNTHROID) 112 MCG tablet TAKE 1 TABLET BY MOUTH DAILY BEFORE BREAKFAST. 90 tablet 1   nitroGLYCERIN (NITROSTAT) 0.4 MG SL tablet Place 0.4 mg under the tongue every 5 (five) minutes as needed for chest pain.     potassium chloride (KLOR-CON M) 10 MEQ tablet Take 10 mEq by mouth daily as needed (take with furosemide).     tamsulosin (FLOMAX) 0.4 MG CAPS capsule Take 2 capsules (0.8 mg total) by mouth daily. 180 capsule  1   valsartan (DIOVAN) 320 MG tablet TAKE ONE TABLET BY MOUTH DAILY (Patient taking differently: Take 320 mg by mouth daily.) 90 tablet 3   zolpidem (AMBIEN) 10 MG tablet Take 1 tablet (10 mg total) by mouth at bedtime. 30 tablet 3   No current facility-administered medications on file prior to visit.   Past Medical History:  Diagnosis Date   A-fib ([place])    Acquired thrombophilia ([place]) 11/17/2019   Acute bronchitis due to other specified organisms 10/28/2021   Acute cough 10/20/2021   Acute non-recurrent maxillary sinusitis 10/28/2021   Anticoagulated 01/21/2015   Atherosclerotic heart disease of native coronary artery  without angina pectoris    Atrial fibrillation with rapid ventricular response ([place]) 11/12/2021   Atrial flutter with rapid ventricular response ([place]) 11/12/2021   CAD in native artery 01/21/2015   Cancer ([place])    skin cancer   CHF (congestive heart failure) ([place]) 11/05/2019   Chronic diastolic (congestive) heart failure (H[facility])    Chronic diastolic heart failure ([place]) 01/21/2015   Last Assessment & Plan:  Is congestive heart failure is mildly decompensated he is edematous he'll increase the dose of his diuretic today the lab work performed including an ARB referred to care transitions for home care he'll follow-up in my office with me in 4-6 weeks daily weights sodium restrictionand compliance of medications beinghemoglobin of will   Chronic obstructive pulmonary disease (COPD) ([place])    Coronary artery disease involving autologous artery coronary bypass graft 11/12/2021   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 11/17/2019   Dizziness 11/12/2021   Dyslipidemia 11/12/2021   Elevated brain natriuretic peptide (BNP) level 11/12/2021   Essential hypertension 01/21/2015   right superior quadrantanopsia   Hearing loss of right ear due to cerumen impaction 10/20/2021   High cholesterol 10/20/2017   Hx of CABG 03/02/2017   Hypertension    Hypertensive heart disease with heart failure ([place])    Hypothyroidism (acquired) 04/24/2018   Impaired fasting glucose    Left rotator cuff tear 11/12/2021   Mixed hyperlipidemia 11/17/2019   Non-seasonal allergic rhinitis due to pollen 10/20/2021   On amiodarone therapy 01/21/2015   PAF (paroxysmal atrial fibrillation) (H[facility])    Primary insomnia    [place]ela of cardioembolic stroke 8/75/6433   Severe obesity with body mass index (BMI) of 36.0 to 36.9 with serious comorbidity ([place])    Squamous cell carcinoma 11/05/2019   Typical atrial flutter ([place]) 01/21/2015   Vitamin D deficiency, unspecified    Past Surgical History:  Procedure Laterality Date    APPENDECTOMY  1956   CARDIAC CATHETERIZATION     CARDIOVERSION  2002   CHOLECYSTECTOMY  2006   CORONARY ARTERY BYPASS GRAFT  2001   HERNIA REPAIR     RADIOLOGY WITH ANESTHESIA Bilateral 01/18/2018   Procedure: MRI WITH ANESTHESIA LUMBAR SPINE WITH AND WITHOUT CONTRAST;  Surgeon: Radiologist, Medication, MD;  Location: [facility];  Service: Radiology;  Laterality: Bilateral;   ROTATOR CUFF REPAIR Left    TONSILLECTOMY  1975    Family History  Problem Relation Age of Onset   Heart disease Father    Arrhythmia Father    Breast cancer Mother    Diabetes type II Sister    Diabetes Brother    Social History   Socioeconomic History   Marital status: Married    Spouse name: Not on file   Number of children: Not on file   Years of education: Not on file   Highest education level:  Not on file  Occupational History   Occupation: Retired  Tobacco Use   Smoking status: Former    Packs/day: 1.00    Years: 15.00    Total pack years: 15.00    Types: Cigarettes    Quit date: 08/29/1978    Years since quitting: 43.5    Passive exposure: Past   Smokeless tobacco: Never  Vaping Use   Vaping Use: Never used  Substance and Sexual Activity   Alcohol use: No    Alcohol/week: 0.0 standard drinks of alcohol   Drug use: No   Sexual activity: Not Currently  Other Topics Concern   Not on file  Social History Narrative   Not on file   Social Determinants of Health   Financial Resource Strain: High Risk (01/28/2022)   Overall Financial Resource Strain (CARDIA)    Difficulty of Paying Living Expenses: Hard  Food Insecurity: No Food Insecurity (12/07/2020)   Hunger Vital Sign    Worried About Running Out of Food in the Last Year: Never true    Ran Out of Food in the Last Year: Never true  Transportation Needs: No Transportation Needs (01/28/2022)   PRAPARE - Hydrologist (Medical): No    Lack of Transportation (Non-Medical): No  Physical Activity: Not on file  Stress:  Not on file  Social Connections: Not on file    Review of Systems  Constitutional:  Negative for chills and fever.  HENT:  Positive for congestion and tinnitus (right ear). Negative for rhinorrhea and sore throat.   Respiratory:  Negative for cough and shortness of breath.   Cardiovascular:  Negative for chest pain and palpitations.  Gastrointestinal:  Positive for constipation. Negative for abdominal pain, diarrhea, nausea and vomiting.  Genitourinary:  Negative for dysuria and urgency.  Musculoskeletal:  Positive for back pain. Negative for arthralgias and myalgias.  Neurological:  Positive for light-headedness. Negative for dizziness and headaches.  Psychiatric/Behavioral:  Negative for dysphoric mood. The patient is not nervous/anxious.      Objective:  BP 132/70   Pulse (!) 56   Temp (!) 97.3 F (36.3 C)   Resp 16   Ht '5\' 10"'$  (1.778 m)   Wt 231 lb (104.8 kg)   BMI 33.15 kg/m      02/28/2022    8:44 AM 02/21/2022   11:40 AM 02/21/2022   10:12 AM  BP/Weight  Systolic BP 295 188 416  Diastolic BP 70 57 75  Wt. (Lbs) 231    BMI 33.15 kg/m2      Physical Exam Vitals reviewed.  Constitutional:      Appearance: Normal appearance. He is obese.  Neck:     Vascular: No carotid bruit.  Cardiovascular:     Rate and Rhythm: Normal rate and regular rhythm.     Heart sounds: Normal heart sounds.  Pulmonary:     Effort: Pulmonary effort is normal.     Breath sounds: Normal breath sounds. No wheezing, rhonchi or rales.  Abdominal:     General: Bowel sounds are normal.     Palpations: Abdomen is soft.     Tenderness: There is no abdominal tenderness.  Neurological:     Mental Status: He is alert and oriented to person, place, and time.  Psychiatric:        Mood and Affect: Mood normal.        Behavior: Behavior normal.     Diabetic Foot Exam - Simple   No data filed  Lab Results  Component Value Date   WBC 6.2 02/09/2022   HGB 14.4 02/09/2022   HCT 41.8  02/09/2022   PLT 173 02/09/2022   GLUCOSE 81 02/09/2022   CHOL 143 02/09/2022   TRIG 92 02/09/2022   HDL 48 02/09/2022   LDLCALC 78 02/09/2022   ALT 19 02/09/2022   AST 21 02/09/2022   NA 141 02/09/2022   K 4.5 02/09/2022   CL 101 02/09/2022   CREATININE 1.19 02/09/2022   BUN 17 02/09/2022   CO2 25 02/09/2022   TSH 1.080 12/24/2021   INR 1.1 02/02/2022   HGBA1C 5.4 04/23/2021   MICROALBUR Positive 150 01/23/2019      Assessment & Plan:   Problem List Items Addressed This Visit       Cardiovascular and Mediastinum   CAD in native artery - Primary    The current medical regimen is effective;  continue present plan and medications. Continue with Atorvastatin 20 mg daily.      Relevant Medications   atorvastatin (LIPITOR) 20 MG tablet   Chronic diastolic heart failure ([place])    Management per specialist. The current medical regimen is effective;  continue present plan and medications.      Relevant Medications   atorvastatin (LIPITOR) 20 MG tablet   PAF (paroxysmal atrial fibrillation) ([place])    Management per specialist. Continue with Eliquis and Amiodarone.      Relevant Medications   atorvastatin (LIPITOR) 20 MG tablet   Hypertensive heart disease with heart failure ([place])    Well controlled.  No changes to medicines.  Continue to work on eating a healthy diet and exercise.  Labs drawn today.       Relevant Medications   atorvastatin (LIPITOR) 20 MG tablet   Bilateral carotid artery stenosis    Awaiting referral for vascular surgery.      Relevant Medications   atorvastatin (LIPITOR) 20 MG tablet     Endocrine   Acquired hypothyroidism    Previously not therapeutic.  Adjusted Synthroid to 112 mcg daily 6 weeks ago.   Recheck TSH and adjust Synthroid as indicated.      Relevant Orders   T4, free   TSH   Impaired fasting glucose    Hemoglobin A1c 5.4%, 3 month avg of blood sugars, is in prediabetic range.  In order to prevent progression to  diabetes, recommend low carb diet and regular exercise        Nervous and Auditory   Hearing loss of right ear    Refer to ENT.  Patient is also having tinnitus.  Recommended bioflavonoids taper OTC until can see ENT.      Relevant Orders   Ambulatory referral to ENT     Musculoskeletal and Integument   Neural foraminal stenosis of cervical spine    Minimal symptoms, but has cracking in neck. Awaiting referral to neurosurgery.        Other   Mixed hyperlipidemia    Not quite at goal of LDL less than 55.  Based on atherosclerosis recommended to increase to Lipitor 20 mg daily.  Patient agreed.   Continue to work on eating a healthy diet and exercise.  Labs drawn today.       Relevant Medications   atorvastatin (LIPITOR) 20 MG tablet   Vitamin D deficiency, unspecified    Check labs      Relevant Orders   VITAMIN D 25 Hydroxy (Vit-D Deficiency, Fractures)  .  Meds ordered this encounter  Medications  atorvastatin (LIPITOR) 20 MG tablet    Sig: Take 1 tablet (20 mg total) by mouth daily.    Dispense:  90 tablet    Refill:  0    Orders Placed This Encounter  Procedures   VITAMIN D 25 Hydroxy (Vit-D Deficiency, Fractures)   T4, free   TSH   Ambulatory referral to ENT    I,Marla Scherrie Gerlach ,acting as a scribe for [name], MD.,have documented all relevant documentation on the behalf of [name], MD,as directed by  [name], MD while in the presence of [name], MD.   Follow-up: Return in about 3 months (around 05/31/2022) for chronic fasting.  An After Visit Summary was printed and given to the patient.  [name], MD [facility] [phone]

## 2022-02-28 ENCOUNTER — Ambulatory Visit (INDEPENDENT_AMBULATORY_CARE_PROVIDER_SITE_OTHER): Payer: Medicare Other | Admitting: Family Medicine

## 2022-02-28 ENCOUNTER — Encounter: Payer: Self-pay | Admitting: Family Medicine

## 2022-02-28 VITALS — BP 132/70 | HR 56 | Temp 97.3°F | Resp 16 | Ht 70.0 in | Wt 231.0 lb

## 2022-02-28 DIAGNOSIS — I5032 Chronic diastolic (congestive) heart failure: Secondary | ICD-10-CM | POA: Diagnosis not present

## 2022-02-28 DIAGNOSIS — M4802 Spinal stenosis, cervical region: Secondary | ICD-10-CM | POA: Diagnosis not present

## 2022-02-28 DIAGNOSIS — H918X1 Other specified hearing loss, right ear: Secondary | ICD-10-CM

## 2022-02-28 DIAGNOSIS — I6523 Occlusion and stenosis of bilateral carotid arteries: Secondary | ICD-10-CM | POA: Diagnosis not present

## 2022-02-28 DIAGNOSIS — E559 Vitamin D deficiency, unspecified: Secondary | ICD-10-CM

## 2022-02-28 DIAGNOSIS — E039 Hypothyroidism, unspecified: Secondary | ICD-10-CM | POA: Diagnosis not present

## 2022-02-28 DIAGNOSIS — R7301 Impaired fasting glucose: Secondary | ICD-10-CM | POA: Diagnosis not present

## 2022-02-28 DIAGNOSIS — I480 Paroxysmal atrial fibrillation: Secondary | ICD-10-CM

## 2022-02-28 DIAGNOSIS — E782 Mixed hyperlipidemia: Secondary | ICD-10-CM

## 2022-02-28 DIAGNOSIS — I110 Hypertensive heart disease with heart failure: Secondary | ICD-10-CM

## 2022-02-28 DIAGNOSIS — I2510 Atherosclerotic heart disease of native coronary artery without angina pectoris: Secondary | ICD-10-CM

## 2022-02-28 HISTORY — DX: Spinal stenosis, cervical region: M48.02

## 2022-02-28 MED ORDER — ATORVASTATIN CALCIUM 20 MG PO TABS: 20.0000 mg | ORAL_TABLET | Freq: Every day | ORAL | 0 refills | Status: DC

## 2022-02-28 NOTE — Assessment & Plan Note (Signed)
Check labs 

## 2022-02-28 NOTE — Assessment & Plan Note (Signed)
>>  ASSESSMENT AND PLAN FOR PAF (PAROXYSMAL ATRIAL FIBRILLATION) ([place]) WRITTEN ON 02/28/2022  9:51 AM BY LEAL-BORJAS, Mallory Enriques I, CMA  Management per specialist. Continue with Eliquis and Amiodarone.

## 2022-02-28 NOTE — Assessment & Plan Note (Signed)
>>  ASSESSMENT AND PLAN FOR CAD IN NATIVE ARTERY WRITTEN ON 02/28/2022 10:27 AM BY COX, KIRSTEN, MD  The current medical regimen is effective;  continue present plan and medications. Continue with Atorvastatin 20 mg daily.

## 2022-02-28 NOTE — Assessment & Plan Note (Signed)
Refer to ENT.  Patient is also having tinnitus.  Recommended bioflavonoids taper OTC until can see ENT.

## 2022-02-28 NOTE — Patient Instructions (Addendum)
RINGING IN EARS: BIOFLAVANOID FOR RINGING IN EARS. REFER TO ENT PHYSICIAN  HIGH CHOLESTEROL: INCREASE LIPITOR TO 20 MG BEFORE BED  Recommend continue to work on eating healthy diet and exercise.

## 2022-02-28 NOTE — Assessment & Plan Note (Signed)
>>  ASSESSMENT AND PLAN FOR CHRONIC DIASTOLIC HEART FAILURE ([place]) WRITTEN ON 02/28/2022  9:51 AM BY LEAL-BORJAS, Yusef Lamp I, CMA  Management per specialist. The current medical regimen is effective;  continue present plan and medications.

## 2022-02-28 NOTE — Assessment & Plan Note (Signed)
Hemoglobin A1c 5.4%, 3 month avg of blood sugars, is in prediabetic range.  In order to prevent progression to diabetes, recommend low carb diet and regular exercise  

## 2022-02-28 NOTE — Assessment & Plan Note (Signed)
Management per specialist. Continue with Eliquis and Amiodarone.

## 2022-02-28 NOTE — Assessment & Plan Note (Signed)
Well controlled.  ?No changes to medicines.  ?Continue to work on eating a healthy diet and exercise.  ?Labs drawn today.  ?

## 2022-02-28 NOTE — Assessment & Plan Note (Addendum)
Management per specialist The current medical regimen is effective;  continue present plan and medications.  

## 2022-02-28 NOTE — Assessment & Plan Note (Addendum)
The current medical regimen is effective;  continue present plan and medications. Continue with Atorvastatin 20 mg daily.

## 2022-02-28 NOTE — Assessment & Plan Note (Addendum)
Not quite at goal of LDL less than 55.  Based on atherosclerosis recommended to increase to Lipitor 20 mg daily.  Patient agreed.   Continue to work on eating a healthy diet and exercise.  Labs drawn today.

## 2022-02-28 NOTE — Assessment & Plan Note (Addendum)
Previously not therapeutic.  Adjusted Synthroid to 112 mcg daily 6 weeks ago.   Recheck TSH and adjust Synthroid as indicated.

## 2022-02-28 NOTE — Assessment & Plan Note (Signed)
Awaiting referral for vascular surgery.

## 2022-02-28 NOTE — Assessment & Plan Note (Signed)
Minimal symptoms, but has cracking in neck. Awaiting referral to neurosurgery.

## 2022-03-01 ENCOUNTER — Other Ambulatory Visit: Payer: Self-pay | Admitting: Cardiology

## 2022-03-01 ENCOUNTER — Other Ambulatory Visit: Payer: Self-pay | Admitting: Family Medicine

## 2022-03-01 DIAGNOSIS — I110 Hypertensive heart disease with heart failure: Secondary | ICD-10-CM

## 2022-03-01 LAB — T4, FREE: Free T4: 1.89 ng/dL — ABNORMAL HIGH (ref 0.82–1.77)

## 2022-03-01 LAB — VITAMIN D 25 HYDROXY (VIT D DEFICIENCY, FRACTURES): Vit D, 25-Hydroxy: 25.4 ng/mL — ABNORMAL LOW (ref 30.0–100.0)

## 2022-03-01 LAB — TSH: TSH: 4.05 u[IU]/mL (ref 0.450–4.500)

## 2022-03-02 ENCOUNTER — Encounter: Payer: Self-pay | Admitting: Family Medicine

## 2022-03-02 ENCOUNTER — Other Ambulatory Visit: Payer: Self-pay

## 2022-03-02 MED ORDER — VITAMIN D (ERGOCALCIFEROL) 1.25 MG (50000 UNIT) PO CAPS: 50000.0000 [IU] | ORAL_CAPSULE | ORAL | 2 refills | Status: DC

## 2022-03-02 MED ORDER — LEVOTHYROXINE SODIUM 100 MCG PO TABS: 100.0000 ug | ORAL_TABLET | Freq: Every day | ORAL | 1 refills | Status: DC

## 2022-03-04 NOTE — Telephone Encounter (Signed)
Rx refill sent to pharmacy. 

## 2022-03-14 ENCOUNTER — Ambulatory Visit (INDEPENDENT_AMBULATORY_CARE_PROVIDER_SITE_OTHER): Payer: Medicare Other | Admitting: Surgery

## 2022-03-14 ENCOUNTER — Encounter: Payer: Self-pay | Admitting: Surgery

## 2022-03-14 VITALS — BP 162/78 | HR 52 | Temp 98.2°F | Resp 20 | Ht 70.0 in | Wt 231.0 lb

## 2022-03-14 DIAGNOSIS — I6523 Occlusion and stenosis of bilateral carotid arteries: Secondary | ICD-10-CM | POA: Diagnosis not present

## 2022-03-14 NOTE — Progress Notes (Signed)
[facility] [place]  Patient name: John Kemp MRN: [id number] DOB: 07/18/1946 Sex: male   REQUESTING PROVIDER:    Dr. Tobie Poet   REASON FOR CONSULT:    Carotid stenosis  HISTORY OF PRESENT ILLNESS:   John Kemp is a 76 y.o. male, who is referred for evaluation of carotid stenosis.  He has a history of a recent light spell where he felt off balance for a few minutes.  Work-up for this included a negative Holter monitor, and an echo which showed mild systolic dysfunction.  Intracranial images did not show any acute findings carotid ultrasound showed 50 to 69% left-sided stenosis both.  The patient does report ringing in his right ear for 1 month.  He describes approximately 7 episodes like this over the past 2 months.  He feels as if he is off balance as if he were drunk.  His episodes are associated with emesis and sweating.  He does not endorse numbness or weakness in either extremity, slurred speech, or amaurosis fugax.  The patient is medically managed for hypertension.  He suffers from A-fib for which she takes Eliquis.  He suffers from COPD secondary to history of tobacco abuse.  He takes a statin for hypercholesterolemia.  PAST MEDICAL HISTORY    Past Medical History:  Diagnosis Date   A-fib ([place])    Acquired thrombophilia ([place]) 11/17/2019   Acute bronchitis due to other specified organisms 10/28/2021   Acute cough 10/20/2021   Acute non-recurrent maxillary sinusitis 10/28/2021   Anticoagulated 01/21/2015   Atherosclerotic heart disease of native coronary artery without angina pectoris    Atrial fibrillation with rapid ventricular response ([place]) 11/12/2021   Atrial flutter with rapid ventricular response ([place]) 11/12/2021   CAD in native artery 01/21/2015   Cancer (HCC)    skin cancer   CHF (congestive heart failure) ([place]) 11/05/2019   Chronic diastolic (congestive) heart failure (HCC)    Chronic diastolic heart  failure ([place]) 01/21/2015   Last Assessment & Plan:  Is congestive heart failure is mildly decompensated he is edematous he'll increase the dose of his diuretic today the lab work performed including an ARB referred to care transitions for home care he'll follow-up in my office with me in 4-6 weeks daily weights sodium restrictionand compliance of medications beinghemoglobin of will   Chronic obstructive pulmonary disease (COPD) ([place])    Coronary artery disease involving autologous artery coronary bypass graft 11/12/2021   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 11/17/2019   Dizziness 11/12/2021   Dyslipidemia 11/12/2021   Elevated brain natriuretic peptide (BNP) level 11/12/2021   Essential hypertension 01/21/2015   right superior quadrantanopsia   Hearing loss of right ear due to cerumen impaction 10/20/2021   High cholesterol 10/20/2017   Hx of CABG 03/02/2017   Hypertension    Hypertensive heart disease with heart failure ([place])    Hypothyroidism (acquired) 04/24/2018   Impaired fasting glucose    Left rotator cuff tear 11/12/2021   Mixed hyperlipidemia 11/17/2019   Non-seasonal allergic rhinitis due to pollen 10/20/2021   On amiodarone therapy 01/21/2015   PAF (paroxysmal atrial fibrillation) (HCC)    Primary insomnia    Sequela of cardioembolic stroke 4/81/8563   Severe obesity with body mass index (BMI) of 36.0 to 36.9 with serious comorbidity ([place])    Squamous cell carcinoma 11/05/2019   Typical atrial flutter ([place]) 01/21/2015   Vitamin D deficiency, unspecified      FAMILY HISTORY   Family  History  Problem Relation Age of Onset   Heart disease Father    Arrhythmia Father    Breast cancer Mother    Diabetes type II Sister    Diabetes Brother     SOCIAL HISTORY:   Social History   Socioeconomic History   Marital status: Married    Spouse name: Not on file   Number of children: Not on file   Years of education: Not on file   Highest education level: Not on  file  Occupational History   Occupation: Retired  Tobacco Use   Smoking status: Former    Packs/day: 1.00    Years: 15.00    Total pack years: 15.00    Types: Cigarettes    Quit date: 08/29/1978    Years since quitting: 43.5    Passive exposure: Past   Smokeless tobacco: Never  Vaping Use   Vaping Use: Never used  Substance and Sexual Activity   Alcohol use: No    Alcohol/week: 0.0 standard drinks of alcohol   Drug use: No   Sexual activity: Not Currently  Other Topics Concern   Not on file  Social History Narrative   Not on file   Social Determinants of Health   Financial Resource Strain: High Risk (01/28/2022)   Overall Financial Resource Strain (CARDIA)    Difficulty of Paying Living Expenses: Hard  Food Insecurity: No Food Insecurity (12/07/2020)   Hunger Vital Sign    Worried About Running Out of Food in the Last Year: Never true    Ran Out of Food in the Last Year: Never true  Transportation Needs: No Transportation Needs (01/28/2022)   PRAPARE - Hydrologist (Medical): No    Lack of Transportation (Non-Medical): No  Physical Activity: Not on file  Stress: Not on file  Social Connections: Not on file  Intimate Partner Violence: Not on file    ALLERGIES:    Allergies  Allergen Reactions   Avelox [Moxifloxacin Hcl]    Cartia Xt [Diltiazem Hcl] Nausea And Vomiting   Pravachol [Pravastatin Sodium] Other (See Comments)    myalgia   Prednisone Other (See Comments)    "makes me feel terrible"   Zetia [Ezetimibe] Other (See Comments)    myalgias    CURRENT MEDICATIONS:    Current Outpatient Medications  Medication Sig Dispense Refill   amiodarone (PACERONE) 200 MG tablet Take 1 tablet (200 mg total) by mouth daily. 90 tablet 2   amLODipine (NORVASC) 5 MG tablet TAKE 1 TABLET (5 MG TOTAL) BY MOUTH DAILY. 90 tablet 1   atorvastatin (LIPITOR) 20 MG tablet Take 1 tablet (20 mg total) by mouth daily. 90 tablet 0   donepezil (ARICEPT) 10  MG tablet Take 1 tablet (10 mg total) by mouth at bedtime. 90 tablet 0   ELIQUIS 5 MG TABS tablet TAKE 1 TABLET BY MOUTH TWICE A DAY (Patient taking differently: Take 5 mg by mouth 2 (two) times daily.) 180 tablet 1   finasteride (PROSCAR) 5 MG tablet Take 1 tablet (5 mg total) by mouth daily. 90 tablet 1   fluticasone (FLONASE) 50 MCG/ACT nasal spray Place 2 sprays into both nostrils daily. 16 g 6   furosemide (LASIX) 40 MG tablet Take 40 mg by mouth daily as needed for fluid or edema.     Inulin (FIBER CHOICE PO) Take 3 tablets by mouth daily.     levothyroxine (SYNTHROID) 100 MCG tablet Take 1 tablet (100 mcg total) by mouth  daily. 90 tablet 1   nitroGLYCERIN (NITROSTAT) 0.4 MG SL tablet Place 0.4 mg under the tongue every 5 (five) minutes as needed for chest pain.     potassium chloride (KLOR-CON M) 10 MEQ tablet Take 10 mEq by mouth daily as needed (take with furosemide).     tamsulosin (FLOMAX) 0.4 MG CAPS capsule Take 2 capsules (0.8 mg total) by mouth daily. 180 capsule 1   valsartan (DIOVAN) 320 MG tablet Take 1 tablet (320 mg total) by mouth daily. 90 tablet 1   Vitamin D, Ergocalciferol, (DRISDOL) 1.25 MG (50000 UNIT) CAPS capsule Take 1 capsule (50,000 Units total) by mouth every 7 (seven) days. 5 capsule 2   zolpidem (AMBIEN) 10 MG tablet Take 1 tablet (10 mg total) by mouth at bedtime. 30 tablet 3   No current facility-administered medications for this visit.    REVIEW OF SYSTEMS:   '[X]'$  denotes positive finding, '[ ]'$  denotes negative finding Cardiac  Comments:  Chest pain or chest pressure:    Shortness of breath upon exertion:    Short of breath when lying flat:    Irregular heart rhythm:        Vascular    Pain in calf, thigh, or hip brought on by ambulation:    Pain in feet at night that wakes you up from your sleep:     Blood clot in your veins:    Leg swelling:         Pulmonary    Oxygen at home:    Productive cough:     Wheezing:         Neurologic    Sudden  weakness in arms or legs:     Sudden numbness in arms or legs:     Sudden onset of difficulty speaking or slurred speech:    Temporary loss of vision in one eye:     Problems with dizziness:         Gastrointestinal    Blood in stool:      Vomited blood:         Genitourinary    Burning when urinating:     Blood in urine:        Psychiatric    Major depression:         Hematologic    Bleeding problems:    Problems with blood clotting too easily:        Skin    Rashes or ulcers:        Constitutional    Fever or chills:     PHYSICAL EXAM:   Vitals:   03/14/22 0823 03/14/22 0826  BP: (!) 161/76 (!) 162/78  Pulse: (!) 52   Resp: 20   Temp: 98.2 F (36.8 C)   SpO2: 95%   Weight: 231 lb (104.8 kg)   Height: '5\' 10"'$  (1.778 m)     GENERAL: The patient is a well-nourished male, in no acute distress. The vital signs are documented above. CARDIAC: There is a regular rate and rhythm.  VASCULAR: Palpable pedal pulses PULMONARY: Nonlabored respirations MUSCULOSKELETAL: There are no major deformities or cyanosis. NEUROLOGIC: No focal weakness or paresthesias are detected. SKIN: There are no ulcers or rashes noted. PSYCHIATRIC: The patient has a normal affect.  STUDIES:   I have reviewed his carotid ultrasound which shows left carotid stenosis in the 50 to 69% category it was felt that the stenosis may be underrepresented.  There is no significant stenosis on the right.  ASSESSMENT and  PLAN   Asymptomatic carotid stenosis: He was found to have 50 to 69% left carotid stenosis during a work-up for balance issues.  Ultrasound suggest that this may be under representative of his true stenosis.  Because he remains asymptomatic, I have elected to get a CT angiogram.  I will wait 6 months as he has recently had multiple studies.  I discussed that I would recommend surgical repair of his carotid disease if this were greater than 80%.  He is waiting to get an appointment with ENT to  evaluate for inner ear issues given his balance issues and pulsatile tinnitus.  I am going to have my office try and facilitate this.   Leia Alf, MD, FACS [facility] [phone] Pager [phone]

## 2022-03-15 DIAGNOSIS — H10022 Other mucopurulent conjunctivitis, left eye: Secondary | ICD-10-CM | POA: Diagnosis not present

## 2022-03-15 DIAGNOSIS — H10021 Other mucopurulent conjunctivitis, right eye: Secondary | ICD-10-CM | POA: Diagnosis not present

## 2022-03-15 DIAGNOSIS — Z6837 Body mass index (BMI) 37.0-37.9, adult: Secondary | ICD-10-CM | POA: Diagnosis not present

## 2022-03-15 DIAGNOSIS — H8121 Vestibular neuronitis, right ear: Secondary | ICD-10-CM | POA: Diagnosis not present

## 2022-03-16 ENCOUNTER — Other Ambulatory Visit (HOSPITAL_COMMUNITY): Payer: Self-pay | Admitting: Neurosurgery

## 2022-03-16 ENCOUNTER — Other Ambulatory Visit: Payer: Self-pay | Admitting: Neurosurgery

## 2022-03-16 DIAGNOSIS — H8121 Vestibular neuronitis, right ear: Secondary | ICD-10-CM

## 2022-03-17 ENCOUNTER — Ambulatory Visit (INDEPENDENT_AMBULATORY_CARE_PROVIDER_SITE_OTHER): Payer: Medicare Other | Admitting: Family Medicine

## 2022-03-17 VITALS — BP 92/58 | HR 56 | Temp 97.4°F | Resp 16 | Ht 70.0 in | Wt 233.0 lb

## 2022-03-17 DIAGNOSIS — J301 Allergic rhinitis due to pollen: Secondary | ICD-10-CM

## 2022-03-17 MED ORDER — AZELASTINE HCL 0.1 % NA SOLN: 2.0000 | Freq: Two times a day (BID) | NASAL | 2 refills | Status: DC

## 2022-03-17 NOTE — Progress Notes (Signed)
Acute Office Visit  Subjective:    Patient ID: John Kemp, male    DOB: 12-29-1945, 76 y.o.   MRN: [id number]  Chief Complaint  Patient presents with   Sore throat with congestion    HPI Patient is in today for nasal congestion. Eyes matted, but gentamicin is helping. He went to the urgent care 2 days ago and eyes have improved.  Sore throat. No fever. No earaches.  Past Medical History:  Diagnosis Date   A-fib ([place])    Acquired thrombophilia ([place]) 11/17/2019   Acute bronchitis due to other specified organisms 10/28/2021   Acute cough 10/20/2021   Acute non-recurrent maxillary sinusitis 10/28/2021   Anticoagulated 01/21/2015   Atherosclerotic heart disease of native coronary artery without angina pectoris    Atrial fibrillation with rapid ventricular response ([place]) 11/12/2021   Atrial flutter with rapid ventricular response ([place]) 11/12/2021   CAD in native artery 01/21/2015   Cancer ([place])    skin cancer   CHF (congestive heart failure) ([place]) 11/05/2019   Chronic diastolic (congestive) heart failure (HCC)    Chronic diastolic heart failure ([place]) 01/21/2015   Last Assessment & Plan:  Is congestive heart failure is mildly decompensated he is edematous he'll increase the dose of his diuretic today the lab work performed including an ARB referred to care transitions for home care he'll follow-up in my office with me in 4-6 weeks daily weights sodium restrictionand compliance of medications beinghemoglobin of will   Chronic obstructive pulmonary disease (COPD) ([place])    Coronary artery disease involving autologous artery coronary bypass graft 11/12/2021   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 11/17/2019   Dizziness 11/12/2021   Dyslipidemia 11/12/2021   Elevated brain natriuretic peptide (BNP) level 11/12/2021   Essential hypertension 01/21/2015   right superior quadrantanopsia   Hearing loss of right ear due to cerumen impaction 10/20/2021   High cholesterol 10/20/2017    Hx of CABG 03/02/2017   Hypertension    Hypertensive heart disease with heart failure ([place])    Hypothyroidism (acquired) 04/24/2018   Impaired fasting glucose    Left rotator cuff tear 11/12/2021   Mixed hyperlipidemia 11/17/2019   Non-seasonal allergic rhinitis due to pollen 10/20/2021   On amiodarone therapy 01/21/2015   PAF (paroxysmal atrial fibrillation) (HCC)    Primary insomnia    Sequela of cardioembolic stroke 12/09/6436   Severe obesity with body mass index (BMI) of 36.0 to 36.9 with serious comorbidity ([place])    Squamous cell carcinoma 11/05/2019   Typical atrial flutter ([place]) 01/21/2015   Vitamin D deficiency, unspecified     Past Surgical History:  Procedure Laterality Date   APPENDECTOMY  1956   CARDIAC CATHETERIZATION     CARDIOVERSION  2002   CHOLECYSTECTOMY  2006   CORONARY ARTERY BYPASS GRAFT  2001   HERNIA REPAIR     RADIOLOGY WITH ANESTHESIA Bilateral 01/18/2018   Procedure: MRI WITH ANESTHESIA LUMBAR SPINE WITH AND WITHOUT CONTRAST;  Surgeon: Radiologist, Medication, MD;  Location: [facility];  Service: Radiology;  Laterality: Bilateral;   ROTATOR CUFF REPAIR Left    TONSILLECTOMY  1975    Family History  Problem Relation Age of Onset   Heart disease Father    Arrhythmia Father    Breast cancer Mother    Diabetes type II Sister    Diabetes Brother     Social History   Socioeconomic History   Marital status: Married    Spouse name: Not on file  Number of children: Not on file   Years of education: Not on file   Highest education level: Not on file  Occupational History   Occupation: Retired  Tobacco Use   Smoking status: Former    Packs/day: 1.00    Years: 15.00    Total pack years: 15.00    Types: Cigarettes    Quit date: 08/29/1978    Years since quitting: 43.6    Passive exposure: Past   Smokeless tobacco: Never  Vaping Use   Vaping Use: Never used  Substance and Sexual Activity   Alcohol use: No    Alcohol/week: 0.0 standard drinks of  alcohol   Drug use: No   Sexual activity: Not Currently  Other Topics Concern   Not on file  Social History Narrative   Not on file   Social Determinants of Health   Financial Resource Strain: High Risk (01/28/2022)   Overall Financial Resource Strain (CARDIA)    Difficulty of Paying Living Expenses: Hard  Food Insecurity: No Food Insecurity (12/07/2020)   Hunger Vital Sign    Worried About Running Out of Food in the Last Year: Never true    Ran Out of Food in the Last Year: Never true  Transportation Needs: No Transportation Needs (01/28/2022)   PRAPARE - Hydrologist (Medical): No    Lack of Transportation (Non-Medical): No  Physical Activity: Not on file  Stress: Not on file  Social Connections: Not on file  Intimate Partner Violence: Not on file    Outpatient Medications Prior to Visit  Medication Sig Dispense Refill   amiodarone (PACERONE) 200 MG tablet Take 1 tablet (200 mg total) by mouth daily. 90 tablet 2   amLODipine (NORVASC) 5 MG tablet TAKE 1 TABLET (5 MG TOTAL) BY MOUTH DAILY. 90 tablet 1   atorvastatin (LIPITOR) 20 MG tablet Take 1 tablet (20 mg total) by mouth daily. 90 tablet 0   donepezil (ARICEPT) 10 MG tablet Take 1 tablet (10 mg total) by mouth at bedtime. (Patient not taking: Reported on 04/13/2022) 90 tablet 0   finasteride (PROSCAR) 5 MG tablet Take 1 tablet (5 mg total) by mouth daily. (Patient not taking: Reported on 04/13/2022) 90 tablet 1   fluticasone (FLONASE) 50 MCG/ACT nasal spray Place 2 sprays into both nostrils daily. (Patient not taking: Reported on 04/13/2022) 16 g 6   furosemide (LASIX) 40 MG tablet Take 40 mg by mouth daily as needed for fluid or edema.     Inulin (FIBER CHOICE PO) Take 3 tablets by mouth daily as needed (Constipation).     levothyroxine (SYNTHROID) 100 MCG tablet Take 1 tablet (100 mcg total) by mouth daily. 90 tablet 1   nitroGLYCERIN (NITROSTAT) 0.4 MG SL tablet Place 0.4 mg under the tongue every 5  (five) minutes as needed for chest pain.     potassium chloride (KLOR-CON M) 10 MEQ tablet Take 10 mEq by mouth daily as needed (take with furosemide).     tamsulosin (FLOMAX) 0.4 MG CAPS capsule Take 2 capsules (0.8 mg total) by mouth daily. (Patient taking differently: Take 0.4 mg by mouth daily.) 180 capsule 1   valsartan (DIOVAN) 320 MG tablet Take 1 tablet (320 mg total) by mouth daily. 90 tablet 1   Vitamin D, Ergocalciferol, (DRISDOL) 1.25 MG (50000 UNIT) CAPS capsule Take 1 capsule (50,000 Units total) by mouth every 7 (seven) days. 5 capsule 2   zolpidem (AMBIEN) 10 MG tablet Take 1 tablet (10 mg  total) by mouth at bedtime. 30 tablet 3   ELIQUIS 5 MG TABS tablet TAKE 1 TABLET BY MOUTH TWICE A DAY (Patient taking differently: Take 5 mg by mouth 2 (two) times daily.) 180 tablet 1   No facility-administered medications prior to visit.    Allergies  Allergen Reactions   Avelox [Moxifloxacin Hcl] Other (See Comments)    Unknown   Cartia Xt [Diltiazem Hcl] Nausea And Vomiting   Pravachol [Pravastatin Sodium] Other (See Comments)    myalgia   Prednisone Other (See Comments)    "makes me feel terrible"   Zetia [Ezetimibe] Other (See Comments)    myalgias    Review of Systems  Constitutional:  Negative for appetite change, fatigue and fever.  HENT:  Positive for congestion, ear pain (right), postnasal drip, sinus pressure, sore throat and tinnitus (right ear).   Respiratory:  Negative for cough, chest tightness, shortness of breath and wheezing.   Cardiovascular:  Negative for chest pain and palpitations.  Gastrointestinal:  Negative for abdominal pain, constipation, diarrhea, nausea and vomiting.  Genitourinary:  Negative for dysuria and hematuria.  Musculoskeletal:  Negative for arthralgias, back pain, joint swelling and myalgias.  Skin:  Negative for rash.  Neurological:  Positive for dizziness (improved) and headaches. Negative for weakness.  Psychiatric/Behavioral:  Negative  for dysphoric mood. The patient is not nervous/anxious.        Objective:    Physical Exam Vitals reviewed.  Constitutional:      Appearance: Normal appearance. He is normal weight.  HENT:     Right Ear: Tympanic membrane normal.     Left Ear: Tympanic membrane normal.     Nose: Congestion and rhinorrhea present.     Comments: Sore on right side of nostril.     Mouth/Throat:     Pharynx: Posterior oropharyngeal erythema (mild) present. No oropharyngeal exudate.  Eyes:     General:        Right eye: No discharge.        Left eye: Discharge (clear discharge.) present. Cardiovascular:     Rate and Rhythm: Normal rate and regular rhythm.     Heart sounds: Normal heart sounds.  Pulmonary:     Effort: Pulmonary effort is normal.     Breath sounds: Normal breath sounds.  Abdominal:     General: Abdomen is flat. Bowel sounds are normal.     Palpations: Abdomen is soft.  Neurological:     Mental Status: He is alert and oriented to person, place, and time.  Psychiatric:        Mood and Affect: Mood normal.        Behavior: Behavior normal.     BP (!) 92/58   Pulse (!) 56   Temp (!) 97.4 F (36.3 C)   Resp 16   Ht '5\' 10"'  (1.778 m)   Wt 233 lb (105.7 kg)   BMI 33.43 kg/m  Wt Readings from Last 3 Encounters:  03/29/22 230 lb (104.3 kg)  03/17/22 233 lb (105.7 kg)  03/14/22 231 lb (104.8 kg)    Health Maintenance Due  Topic Date Due   Hepatitis C Screening  Never done   Zoster Vaccines- Shingrix (1 of 2) Never done   COVID-19 Vaccine (3 - Moderna risk series) 01/15/2021   INFLUENZA VACCINE  03/29/2022    There are no preventive care reminders to display for this patient.   Lab Results  Component Value Date   TSH 4.050 02/28/2022   Lab Results  Component Value Date   WBC 6.2 02/09/2022   HGB 14.4 02/09/2022   HCT 41.8 02/09/2022   MCV 94 02/09/2022   PLT 173 02/09/2022   Lab Results  Component Value Date   NA 141 02/09/2022   K 4.5 02/09/2022   CO2 25  02/09/2022   GLUCOSE 81 02/09/2022   BUN 17 02/09/2022   CREATININE 1.19 02/09/2022   BILITOT 0.9 02/09/2022   ALKPHOS 78 02/09/2022   AST 21 02/09/2022   ALT 19 02/09/2022   PROT 6.6 02/09/2022   ALBUMIN 4.4 02/09/2022   CALCIUM 9.1 02/09/2022   ANIONGAP 7 02/02/2022   EGFR 64 02/09/2022   Lab Results  Component Value Date   CHOL 143 02/09/2022   Lab Results  Component Value Date   HDL 48 02/09/2022   Lab Results  Component Value Date   LDLCALC 78 02/09/2022   Lab Results  Component Value Date   TRIG 92 02/09/2022   Lab Results  Component Value Date   CHOLHDL 3.0 02/09/2022   Lab Results  Component Value Date   HGBA1C 5.4 04/23/2021         Assessment & Plan:   Problem List Items Addressed This Visit       Respiratory   Non-seasonal allergic rhinitis due to pollen - Primary    Rx: azelastine.  Held flonase due to sore in nose.  Continued singulair.      Follow up: as needed  [name], MD

## 2022-03-29 ENCOUNTER — Ambulatory Visit (INDEPENDENT_AMBULATORY_CARE_PROVIDER_SITE_OTHER): Payer: Medicare Other | Admitting: Family Medicine

## 2022-03-29 ENCOUNTER — Encounter: Payer: Self-pay | Admitting: Family Medicine

## 2022-03-29 VITALS — BP 120/60 | HR 67 | Temp 97.5°F | Resp 18 | Ht 70.0 in | Wt 230.0 lb

## 2022-03-29 DIAGNOSIS — J208 Acute bronchitis due to other specified organisms: Secondary | ICD-10-CM

## 2022-03-29 DIAGNOSIS — J0100 Acute maxillary sinusitis, unspecified: Secondary | ICD-10-CM | POA: Diagnosis not present

## 2022-03-29 MED ORDER — CEFDINIR 300 MG PO CAPS: 300.0000 mg | ORAL_CAPSULE | Freq: Two times a day (BID) | ORAL | 0 refills | Status: DC

## 2022-03-29 NOTE — Assessment & Plan Note (Addendum)
Rx: azelastine.  Held flonase due to sore in nose.  Continued singulair.

## 2022-03-29 NOTE — Progress Notes (Signed)
Acute Office Visit  Subjective:    Patient ID: John Kemp, male    DOB: [birth year], 76 y.o.   MRN: [id number]  Chief Complaint  Patient presents with   Cough   Shortness of Breath    HPI: Patient is in today for nasal congestion. Right side sinus pain. No sore throat, but now coughing. Dry cough. No fevers.   Past Medical History:  Diagnosis Date   A-fib ([place])    Acquired thrombophilia ([place]) 11/17/2019   Acute bronchitis due to other specified organisms 10/28/2021   Acute cough 10/20/2021   Acute non-recurrent maxillary sinusitis 10/28/2021   Anticoagulated 01/21/2015   Atherosclerotic heart disease of native coronary artery without angina pectoris    Atrial fibrillation with rapid ventricular response ([place]) 11/12/2021   Atrial flutter with rapid ventricular response ([place]) 11/12/2021   CAD in native artery 01/21/2015   Cancer ([place])    skin cancer   CHF (congestive heart failure) ([place]) 11/05/2019   Chronic diastolic (congestive) heart failure (HCC)    Chronic diastolic heart failure ([place]) 01/21/2015   Last Assessment & Plan:  Is congestive heart failure is mildly decompensated he is edematous he'll increase the dose of his diuretic today the lab work performed including an ARB referred to care transitions for home care he'll follow-up in my office with me in 4-6 weeks daily weights sodium restrictionand compliance of medications beinghemoglobin of will   Chronic obstructive pulmonary disease (COPD) ([place])    Coronary artery disease involving autologous artery coronary bypass graft 11/12/2021   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 11/17/2019   Dizziness 11/12/2021   Dyslipidemia 11/12/2021   Elevated brain natriuretic peptide (BNP) level 11/12/2021   Essential hypertension 01/21/2015   right superior quadrantanopsia   Hearing loss of right ear due to cerumen impaction 10/20/2021   High cholesterol 10/20/2017   Hx of CABG 03/02/2017   Hypertension    Hypertensive  heart disease with heart failure ([place])    Hypothyroidism (acquired) 04/24/2018   Impaired fasting glucose    Left rotator cuff tear 11/12/2021   Mixed hyperlipidemia 11/17/2019   Non-seasonal allergic rhinitis due to pollen 10/20/2021   On amiodarone therapy 01/21/2015   PAF (paroxysmal atrial fibrillation) (HCC)    Primary insomnia    [place] of cardioembolic stroke 12/05/8117   Severe obesity with body mass index (BMI) of 36.0 to 36.9 with serious comorbidity ([place])    Squamous cell carcinoma 11/05/2019   Typical atrial flutter ([place]) 01/21/2015   Vitamin D deficiency, unspecified     Past Surgical History:  Procedure Laterality Date   APPENDECTOMY  1956   CARDIAC CATHETERIZATION     CARDIOVERSION  2002   CHOLECYSTECTOMY  2006   CORONARY ARTERY BYPASS GRAFT  2001   HERNIA REPAIR     RADIOLOGY WITH ANESTHESIA Bilateral 01/18/2018   Procedure: MRI WITH ANESTHESIA LUMBAR SPINE WITH AND WITHOUT CONTRAST;  Surgeon: Radiologist, Medication, MD;  Location: [facility];  Service: Radiology;  Laterality: Bilateral;   ROTATOR CUFF REPAIR Left    TONSILLECTOMY  1975    Family History  Problem Relation Age of Onset   Heart disease Father    Arrhythmia Father    Breast cancer Mother    Diabetes type II Sister    Diabetes Brother     Social History   Socioeconomic History   Marital status: Married    Spouse name: Not on file   Number of children: Not on file   Years  of education: Not on file   Highest education level: Not on file  Occupational History   Occupation: Retired  Tobacco Use   Smoking status: Former    Packs/day: 1.00    Years: 15.00    Total pack years: 15.00    Types: Cigarettes    Quit date: 08/29/1978    Years since quitting: 43.6    Passive exposure: Past   Smokeless tobacco: Never  Vaping Use   Vaping Use: Never used  Substance and Sexual Activity   Alcohol use: No    Alcohol/week: 0.0 standard drinks of alcohol   Drug use: No   Sexual activity: Not Currently   Other Topics Concern   Not on file  Social History Narrative   Not on file   Social Determinants of Health   Financial Resource Strain: High Risk (01/28/2022)   Overall Financial Resource Strain (CARDIA)    Difficulty of Paying Living Expenses: Hard  Food Insecurity: No Food Insecurity (12/07/2020)   Hunger Vital Sign    Worried About Running Out of Food in the Last Year: Never true    Ran Out of Food in the Last Year: Never true  Transportation Needs: No Transportation Needs (01/28/2022)   PRAPARE - Hydrologist (Medical): No    Lack of Transportation (Non-Medical): No  Physical Activity: Not on file  Stress: Not on file  Social Connections: Not on file  Intimate Partner Violence: Not on file    Outpatient Medications Prior to Visit  Medication Sig Dispense Refill   amiodarone (PACERONE) 200 MG tablet Take 1 tablet (200 mg total) by mouth daily. 90 tablet 2   amLODipine (NORVASC) 5 MG tablet TAKE 1 TABLET (5 MG TOTAL) BY MOUTH DAILY. 90 tablet 1   atorvastatin (LIPITOR) 20 MG tablet Take 1 tablet (20 mg total) by mouth daily. 90 tablet 0   azelastine (ASTELIN) 0.1 % nasal spray Place 2 sprays into both nostrils 2 (two) times daily. Use in each nostril as directed 30 mL 2   donepezil (ARICEPT) 10 MG tablet Take 1 tablet (10 mg total) by mouth at bedtime. 90 tablet 0   ELIQUIS 5 MG TABS tablet TAKE 1 TABLET BY MOUTH TWICE A DAY (Patient taking differently: Take 5 mg by mouth 2 (two) times daily.) 180 tablet 1   finasteride (PROSCAR) 5 MG tablet Take 1 tablet (5 mg total) by mouth daily. 90 tablet 1   fluticasone (FLONASE) 50 MCG/ACT nasal spray Place 2 sprays into both nostrils daily. 16 g 6   furosemide (LASIX) 40 MG tablet Take 40 mg by mouth daily as needed for fluid or edema.     Inulin (FIBER CHOICE PO) Take 3 tablets by mouth daily.     levothyroxine (SYNTHROID) 100 MCG tablet Take 1 tablet (100 mcg total) by mouth daily. 90 tablet 1   nitroGLYCERIN  (NITROSTAT) 0.4 MG SL tablet Place 0.4 mg under the tongue every 5 (five) minutes as needed for chest pain.     potassium chloride (KLOR-CON M) 10 MEQ tablet Take 10 mEq by mouth daily as needed (take with furosemide).     tamsulosin (FLOMAX) 0.4 MG CAPS capsule Take 2 capsules (0.8 mg total) by mouth daily. 180 capsule 1   valsartan (DIOVAN) 320 MG tablet Take 1 tablet (320 mg total) by mouth daily. 90 tablet 1   Vitamin D, Ergocalciferol, (DRISDOL) 1.25 MG (50000 UNIT) CAPS capsule Take 1 capsule (50,000 Units total) by mouth every  7 (seven) days. 5 capsule 2   zolpidem (AMBIEN) 10 MG tablet Take 1 tablet (10 mg total) by mouth at bedtime. 30 tablet 3   No facility-administered medications prior to visit.    Allergies  Allergen Reactions   Avelox [Moxifloxacin Hcl]    Cartia Xt [Diltiazem Hcl] Nausea And Vomiting   Pravachol [Pravastatin Sodium] Other (See Comments)    myalgia   Prednisone Other (See Comments)    "makes me feel terrible"   Zetia [Ezetimibe] Other (See Comments)    myalgias    Review of Systems  Constitutional:  Positive for fatigue. Negative for chills and fever.  HENT:  Positive for congestion and sinus pain. Negative for ear pain and sore throat.   Respiratory:  Positive for cough, shortness of breath and wheezing.   Cardiovascular:  Negative for chest pain.  Gastrointestinal:  Negative for nausea.  Musculoskeletal:  Negative for back pain.  Neurological:  Negative for dizziness and headaches.  Psychiatric/Behavioral:  Negative for dysphoric mood.        Objective:    Physical Exam Vitals reviewed.  Constitutional:      Appearance: Normal appearance. He is well-developed.  HENT:     Right Ear: Tympanic membrane, ear canal and external ear normal.     Left Ear: Tympanic membrane, ear canal and external ear normal.     Nose: Congestion present. No rhinorrhea.     Mouth/Throat:     Mouth: Mucous membranes are moist.     Pharynx: No oropharyngeal exudate  or posterior oropharyngeal erythema.  Cardiovascular:     Rate and Rhythm: Normal rate. Rhythm irregular.     Heart sounds: Normal heart sounds.  Pulmonary:     Effort: Pulmonary effort is normal. No respiratory distress.     Breath sounds: Wheezing (RUL) present. No rhonchi or rales.  Neurological:     Mental Status: He is alert.     Gait: Gait normal.  Psychiatric:        Mood and Affect: Mood normal.        Behavior: Behavior normal.     BP 120/60   Pulse 67   Temp (!) 97.5 F (36.4 C)   Resp 18   Ht _0  (1.778 m)   Wt 230 lb (104.3 kg)   SpO2 96%   BMI 33.00 kg/m  Wt Readings from Last 3 Encounters:  03/29/22 230 lb (104.3 kg)  03/17/22 233 lb (105.7 kg)  03/14/22 231 lb (104.8 kg)    Health Maintenance Due  Topic Date Due   Hepatitis C Screening  Never done   Zoster Vaccines- Shingrix (1 of 2) Never done   COVID-19 Vaccine (3 - Moderna risk series) 01/15/2021   INFLUENZA VACCINE  03/29/2022    There are no preventive care reminders to display for this patient.   Lab Results  Component Value Date   TSH 4.050 02/28/2022   Lab Results  Component Value Date   WBC 6.2 02/09/2022   HGB 14.4 02/09/2022   HCT 41.8 02/09/2022   MCV 94 02/09/2022   PLT 173 02/09/2022   Lab Results  Component Value Date   NA 141 02/09/2022   K 4.5 02/09/2022   CO2 25 02/09/2022   GLUCOSE 81 02/09/2022   BUN 17 02/09/2022   CREATININE 1.19 02/09/2022   BILITOT 0.9 02/09/2022   ALKPHOS 78 02/09/2022   AST 21 02/09/2022   ALT 19 02/09/2022   PROT 6.6 02/09/2022   ALBUMIN 4.4 02/09/2022  CALCIUM 9.1 02/09/2022   ANIONGAP 7 02/02/2022   EGFR 64 02/09/2022   Lab Results  Component Value Date   CHOL 143 02/09/2022   Lab Results  Component Value Date   HDL 48 02/09/2022   Lab Results  Component Value Date   LDLCALC 78 02/09/2022   Lab Results  Component Value Date   TRIG 92 02/09/2022   Lab Results  Component Value Date   CHOLHDL 3.0 02/09/2022   Lab  Results  Component Value Date   HGBA1C 5.4 04/23/2021       Assessment & Plan:   Problem List Items Addressed This Visit       Respiratory   Acute non-recurrent maxillary sinusitis    cefdinir      Relevant Medications   cefdinir (OMNICEF) 300 MG capsule   Acute bronchitis due to other specified organisms - Primary    Delsym as directed.  Albuterol nebulizer four times a day as needed.       Relevant Medications   cefdinir (OMNICEF) 300 MG capsule   Meds ordered this encounter  Medications   cefdinir (OMNICEF) 300 MG capsule    Sig: Take 1 capsule (300 mg total) by mouth 2 (two) times daily.    Dispense:  20 capsule    Refill:  0    No orders of the defined types were placed in this encounter.    Follow-up: No follow-ups on file.  An After Visit Summary was printed and given to the patient.  [name], MD [facility] [phone]

## 2022-03-29 NOTE — Assessment & Plan Note (Signed)
Delsym as directed.  Albuterol nebulizer four times a day as needed.

## 2022-03-29 NOTE — Assessment & Plan Note (Signed)
cefdinir 

## 2022-04-06 ENCOUNTER — Other Ambulatory Visit: Payer: Self-pay | Admitting: Family Medicine

## 2022-04-07 ENCOUNTER — Telehealth: Payer: Self-pay

## 2022-04-07 NOTE — Chronic Care Management (AMB) (Signed)
Chronic Care Management Pharmacy Assistant   Name: John Kemp  MRN: [id number] DOB: April 16, 1946  Reason for Encounter: Disease State/ Hypertension  Recent office visits:  03-29-2022 [name], MD. START cefdinir 300 mg twice daily.  03-17-2022 [name], MD. START azelastine 2 sprays in each nostrils twice daily.  02-28-2022 [name], MD. Free T4= 1.89. Vit D= 25.4. Referral placed to ENT. STOP Singulair, Zofran and valium.  02-09-2022 [name], MD. Echocardiogram and US Carotid Bilateral ordered. START Valium 2 mg twice daly PRN.  02-02-2022 [name], MD. MR Cervical Spine Wo Contrast ordered.  Recent consult visits:  03-14-2022 Serafina Mitchell, MD (Vascular surgery). Follow up no changes.  Hospital visits:  Medication Reconciliation was completed by comparing discharge summary, patient's EMR and Pharmacy list, and upon discussion with patient.  Admitted to the hospital on 02-02-2022 due to Near syncope.[facility] Discharge date was 02-02-2022. Discharged from [facility]?Medications Started at [facility] Discharge:?? None  Medication Changes at Hospital Discharge: None  Medications Discontinued at Hospital Discharge: None  Medications that remain the same after Hospital Discharge:??  -All other medications will remain the same.    Medications: Outpatient Encounter Medications as of 04/07/2022  Medication Sig   amiodarone (PACERONE) 200 MG tablet Take 1 tablet (200 mg total) by mouth daily.   amLODipine (NORVASC) 5 MG tablet TAKE 1 TABLET (5 MG TOTAL) BY MOUTH DAILY.   atorvastatin (LIPITOR) 20 MG tablet Take 1 tablet (20 mg total) by mouth daily.   azelastine (ASTELIN) 0.1 % nasal spray Place 2 sprays into both nostrils 2 (two) times daily. Use in each nostril as directed   cefdinir (OMNICEF) 300 MG capsule Take 1 capsule (300 mg total) by mouth 2 (two) times daily.   donepezil (ARICEPT) 10 MG tablet Take 1 tablet (10 mg total) by mouth  at bedtime.   ELIQUIS 5 MG TABS tablet TAKE 1 TABLET BY MOUTH TWICE A DAY   finasteride (PROSCAR) 5 MG tablet Take 1 tablet (5 mg total) by mouth daily.   fluticasone (FLONASE) 50 MCG/ACT nasal spray Place 2 sprays into both nostrils daily.   furosemide (LASIX) 40 MG tablet Take 40 mg by mouth daily as needed for fluid or edema.   Inulin (FIBER CHOICE PO) Take 3 tablets by mouth daily.   levothyroxine (SYNTHROID) 100 MCG tablet Take 1 tablet (100 mcg total) by mouth daily.   nitroGLYCERIN (NITROSTAT) 0.4 MG SL tablet Place 0.4 mg under the tongue every 5 (five) minutes as needed for chest pain.   potassium chloride (KLOR-CON M) 10 MEQ tablet Take 10 mEq by mouth daily as needed (take with furosemide).   tamsulosin (FLOMAX) 0.4 MG CAPS capsule Take 2 capsules (0.8 mg total) by mouth daily.   valsartan (DIOVAN) 320 MG tablet Take 1 tablet (320 mg total) by mouth daily.   Vitamin D, Ergocalciferol, (DRISDOL) 1.25 MG (50000 UNIT) CAPS capsule Take 1 capsule (50,000 Units total) by mouth every 7 (seven) days.   zolpidem (AMBIEN) 10 MG tablet Take 1 tablet (10 mg total) by mouth at bedtime.   No facility-administered encounter medications on file as of 04/07/2022.   Recent Office Vitals: BP Readings from Last 3 Encounters:  03/29/22 120/60  03/17/22 (!) 92/58  03/14/22 (!) 162/78   Pulse Readings from Last 3 Encounters:  03/29/22 67  03/17/22 (!) 56  03/14/22 (!) 52    Wt Readings from Last 3 Encounters:  03/29/22 230 lb (104.3 kg)  03/17/22 233 lb (105.7 kg)  03/14/22 231 lb (104.8 kg)     Kidney Function Lab Results  Component Value Date/Time   CREATININE 1.19 02/09/2022 12:09 PM   CREATININE 1.44 (H) 02/02/2022 03:15 PM   GFRNONAA 51 (L) 02/02/2022 03:15 PM   GFRAA 54 (L) 09/21/2020 10:05 AM       Latest Ref Rng & Units 02/09/2022   12:09 PM 02/02/2022    3:15 PM 11/29/2021    5:03 AM  BMP  Glucose 70 - 99 mg/dL 81  123  104   BUN 8 - 27 mg/dL '17  18  16   '$ Creatinine 0.76 -  1.27 mg/dL 1.19  1.44  1.12   BUN/Creat Ratio 10 - 24 14     Sodium 134 - 144 mmol/L 141  138  134   Potassium 3.5 - 5.2 mmol/L 4.5  3.7  3.5   Chloride 96 - 106 mmol/L 101  106  103   CO2 20 - 29 mmol/L '25  25  23   '$ Calcium 8.6 - 10.2 mg/dL 9.1  9.1  8.6      Current antihypertensive regimen:  Amlodipine 5 mg bid Furosemide 40 mg Prn Valsartan 320 mg daily Potassium 10 meq daily prn   Patient verbally confirms he is taking the above medications as directed. Yes  How often are you checking your Blood Pressure? infrequently  he checks his blood pressure in the morning after taking his medication.  Current home BP readings: Patient stated he doesn't check blood pressure frequently at home but will try to start. Last office visit BP 120/60 67. Patient couldn't recall last home reading. Wrist or arm cuff: Arm cuff Caffeine intake: 1 12 oz diet pepsi weekly Salt intake: Limited  OTC medications including pseudoephedrine or NSAIDs? Patient stated no  Any readings above 180/120? No  What recent interventions/DTPs have been made by any provider to improve Blood Pressure control since last CPP Visit:  Educated on BP goals and benefits of medications for prevention of heart attack, stroke and kidney damage; Daily salt intake goal < 2300 mg; Exercise goal of 150 minutes per week; Importance of home blood pressure monitoring; -Counseled to monitor BP at home weekly, document, and provide log at future appointments -Counseled on diet and exercise extensively Recommended to continue current medication Recommended beginning to check bp weekly at home   Any recent hospitalizations or ED visits since last visit with CPP? Yes  What diet changes have been made to improve Blood Pressure Control?  Patient stated he doesn't add salt to food and drinks plenty of water daily.  What exercise is being done to improve your Blood Pressure Control?  Patient states he moves around daily but doesn't  exercise.   Adherence Review: Is the patient currently on ACE/ARB medication? Yes Does the patient have >5 day gap between last estimated fill dates? CPP to review  Care Gaps: Last annual wellness visit? 08-26-2021  Hep C screening overdue Shingrix overdue 3rd Moderna vaccine overdue last completed 12-18-2020 Flu vaccine overdue  Star Rating Drugs: Valsartan 320 mg- Last filled 03-04-2022 90 DS Atorvastatin 10 mg- Last filled 02-28-2022 90 DS  [place] Clinical Pharmacist Assistant [phone]

## 2022-04-09 DIAGNOSIS — J209 Acute bronchitis, unspecified: Secondary | ICD-10-CM | POA: Diagnosis not present

## 2022-04-16 ENCOUNTER — Encounter: Payer: Self-pay | Admitting: Family Medicine

## 2022-04-18 ENCOUNTER — Encounter (HOSPITAL_COMMUNITY): Payer: Self-pay | Admitting: *Deleted

## 2022-04-18 ENCOUNTER — Other Ambulatory Visit: Payer: Self-pay

## 2022-04-18 NOTE — Progress Notes (Signed)
SDW CALL  Patient was given pre-op instructions over the phone. The opportunity was given for the patient to ask questions. No further questions asked. Patient verbalized understanding of instructions given.   PCP - Dr. Tobie Poet Cardiologist - Dr. Bettina Gavia  PPM/ICD -  Device Orders -  Rep Notified -   Chest x-ray - 11-29-21 1 view EKG - 02-02-22 Stress Test - 10/06/2015 ECHO - 02/15/22 Cardiac Cath - 2001   Blood Thinner Instructions: No need to stop Eliquis, verified with anesthesia Aspirin Instructions:  ERAS Protcol - clear liquids until 7am PRE-SURGERY Ensure or G2-   COVID TEST- no   Anesthesia review: YES  Patient denies shortness of breath, fever, cough and chest pain over the phone call   All instructions explained to the patient, with a verbal understanding of the material.

## 2022-04-18 NOTE — Anesthesia Preprocedure Evaluation (Signed)
Anesthesia Evaluation  Patient identified by MRN, date of birth, ID band Patient awake    Reviewed: Allergy & Precautions, NPO status , Patient's Chart, lab work & pertinent test results  Airway Mallampati: II  TM Distance: >3 FB Neck ROM: Full    Dental  (+) Dental Advisory Given, Edentulous Upper, Edentulous Lower   Pulmonary COPD, former smoker,    Pulmonary exam normal breath sounds clear to auscultation       Cardiovascular hypertension, Pt. on medications + CAD, + Past MI, + CABG and +CHF  Normal cardiovascular exam+ dysrhythmias Atrial Fibrillation  Rhythm:Regular Rate:Normal  Echo 02/15/22: 1. Left ventricular ejection fraction, by estimation, is 60 to 65%. The  left ventricle has normal function. The left ventricle has no regional  wall motion abnormalities. There is mild concentric left ventricular  hypertrophy. Left ventricular diastolic  parameters are consistent with Grade II diastolic dysfunction  (pseudonormalization). Elevated left atrial pressure.  2. Right ventricular systolic function is mildly reduced. The right  ventricular size is not well visualized. Tricuspid regurgitation signal is  inadequate for assessing PA pressure.  3. Left atrial size was mild to moderately dilated.  4. The mitral valve is normal in structure. No evidence of mitral valve  regurgitation. No evidence of mitral stenosis.  5. The aortic valve is normal in structure. Aortic valve regurgitation is  trivial. Aortic valve sclerosis/calcification is present, without any  evidence of aortic stenosis.  6. Aortic Normal DTA.  7. The inferior vena cava is normal in size with greater than 50%  respiratory variability, suggesting right atrial pressure of 3 mmHg.    Neuro/Psych  Headaches, ACUTE VESTIBULAR NEURONITIS OF RIGHT EAR CVA negative psych ROS   GI/Hepatic negative GI ROS, Neg liver ROS,   Endo/Other  Hypothyroidism  Obesity   Renal/GU negative Renal ROS     Musculoskeletal negative musculoskeletal ROS (+)   Abdominal   Peds  Hematology  (+) Blood dyscrasia (Eliquis), ,   Anesthesia Other Findings Day of surgery medications reviewed with the patient.  Reproductive/Obstetrics                           Anesthesia Physical Anesthesia Plan  ASA: 3  Anesthesia Plan: General   Post-op Pain Management: Minimal or no pain anticipated   Induction: Intravenous  PONV Risk Score and Plan: 2 and Dexamethasone and Ondansetron  Airway Management Planned: LMA  Additional Equipment:   Intra-op Plan:   Post-operative Plan: Extubation in OR  Informed Consent: I have reviewed the patients History and Physical, chart, labs and discussed the procedure including the risks, benefits and alternatives for the proposed anesthesia with the patient or authorized representative who has indicated his/her understanding and acceptance.     Dental advisory given  Plan Discussed with: CRNA  Anesthesia Plan Comments: (PAT note by Karoline Caldwell, PA-C: [facility] for history of CAD s/p remote CABG 2001, paroxysmal atrial fibrillation on Eliquis (maintaining sinus rhythm on amiodarone), CVA, sinus bradycardia, HTN, HFpEF.  Last nuclear stress 09/2015 was nonischemic.  Recent event monitor April 2023 showed baseline rhythm sinus bradycardia with a minimum rate of 40 bpm.  There were no pauses of 3 seconds or greater, no episodes of second or third-degree AV block, no episodes of complex ventricular arrhythmia atrial fibrillation or flutter, and no symptomatic events.  Last seen by Dr. Bettina Gavia 01/10/2022 and stable from cardiac standpoint at that time.  Commented that he does not require pacemaker  for his mild sinus bradycardia.  HFpEF was noted to be compensated.  70-monthfollow-up recommended.  Carotid stenosis followed by vascular surgery.  Recently established with Dr. BTrula Sladeon 03/14/2022  as referral from PCP after duplex showed elevated velocities in the left ICA.  Per Dr. BStephens Shirenote, "Asymptomatic carotid stenosis: He was found to have 570to 69% left carotid stenosis during a work-up for balance issues. Ultrasound suggest that this may be under representative of his true stenosis. Because he remains asymptomatic, I have elected to get a CT angiogram. I will wait 6 months as he has recently had multiple studies. I discussed that I would recommend surgical repair of his carotid disease if this were greater than 80%. He is waiting to get an appointment with ENT to evaluate for inner ear issues given his balance issues and pulsatile tinnitus. I am going to have my office try and facilitate this."  Patient is maintained on Aricept for history of mild memory cognitive issues  Patient will need day of surgery labs and evaluation.  EKG 02/02/2022: Sinus bradycardia rate 54. Minimal voltage criteria for LVH, may be normal variant ( R in aVL ). Inferior infarct , age undetermined. Anterolateral infarct , age undetermined.  No significant change from prior.  TTE 02/15/2022: 1. Left ventricular ejection fraction, by estimation, is 60 to 65%. The  left ventricle has normal function. The left ventricle has no regional  wall motion abnormalities. There is mild concentric left ventricular  hypertrophy. Left ventricular diastolic  parameters are consistent with Grade II diastolic dysfunction  (pseudonormalization). Elevated left atrial pressure.  2. Right ventricular systolic function is mildly reduced. The right  ventricular size is not well visualized. Tricuspid regurgitation signal is  inadequate for assessing PA pressure.  3. Left atrial size was mild to moderately dilated.  4. The mitral valve is normal in structure. No evidence of mitral valve  regurgitation. No evidence of mitral stenosis.  5. The aortic valve is normal in structure. Aortic valve regurgitation is  trivial.  Aortic valve sclerosis/calcification is present, without any  evidence of aortic stenosis.  6. Aortic Normal DTA.  7. The inferior vena cava is normal in size with greater than 50%  respiratory variability, suggesting right atrial pressure of 3 mmHg. .  Carotid duplex 02/14/2022: Impression: Right: Color duplex indicates moderate heterogeneous and calcified plaque, with no hemodynamically significant stenosis by duplex criteria in the extracranial cerebrovascular circulation. Left: Heterogeneous and partially calcified plaque at the left carotid bifurcation, contributing to 50 to 69% stenosis by established duplex criteria.  Note the flow velocity of the left ICA were obtained from an area distal to the maximum narrowing due to the presence of anterior wall plaque with shadowing and may be underestimating percentage of ICA stenosis.  Further evaluation is warranted, consider formal cerebral angiogram or a second best, CT angiogram.  30-day event monitor 12/01/2021: Rhythm throughout with sinus sinus bradycardia was noted to minimum rate of 40 bpm. Are no pauses of 3 seconds or greater there were no episodes of second or third-degree AV block. There are no episodes of complex ventricular arrhythmia atrial fibrillation or flutter. There were no symptomatic events.  Nuclear stress 10/06/2015 (scanned under media tab correspondence 02/02/2018): Impression: 1.  No ischemia seen on the scan. 2.  Normal gated images. 3.  Normal ejection fraction calculated to be at 54%.  Fixed defect involving inferior and inferior lateral walls probably related to diaphragmatic attenuation.  )  Anesthesia Quick Evaluation  

## 2022-04-18 NOTE — Progress Notes (Signed)
Anesthesia Chart Review:S Same day workup  Follows cardiology for history of CAD s/p remote CABG 2001, paroxysmal atrial fibrillation on Eliquis (maintaining sinus rhythm on amiodarone), CVA, sinus bradycardia, HTN, HFpEF.  Last nuclear stress 09/2015 was nonischemic.  Recent event monitor April 2023 showed baseline rhythm sinus bradycardia with a minimum rate of 40 bpm.  There were no pauses of 3 seconds or greater, no episodes of second or third-degree AV block, no episodes of complex ventricular arrhythmia atrial fibrillation or flutter, and no symptomatic events.  Last seen by Dr. Bettina Gavia 01/10/2022 and stable from cardiac standpoint at that time.  Commented that he does not require pacemaker for his mild sinus bradycardia.  HFpEF was noted to be compensated.  76-monthfollow-up recommended.  Carotid stenosis followed by vascular surgery.  Recently established with Dr. BTrula Sladeon 03/14/2022 as referral from PCP after duplex showed elevated velocities in the left ICA.  Per Dr. BStephens Shirenote, "Asymptomatic carotid stenosis: He was found to have 549to 69% left carotid stenosis during a work-up for balance issues.  Ultrasound suggest that this may be under representative of his true stenosis.  Because he remains asymptomatic, I have elected to get a CT angiogram.  I will wait 6 months as he has recently had multiple studies.  I discussed that I would recommend surgical repair of his carotid disease if this were greater than 80%. He is waiting to get an appointment with ENT to evaluate for inner ear issues given his balance issues and pulsatile tinnitus.  I am going to have my office try and facilitate this."   Patient is maintained on Aricept for history of mild memory cognitive issues  Patient will need day of surgery labs and evaluation.  EKG 02/02/2022: Sinus bradycardia rate 54. Minimal voltage criteria for LVH, may be normal variant ( R in aVL ). Inferior infarct , age undetermined. Anterolateral infarct ,  age undetermined.  No significant change from prior.  TTE 02/15/2022:  1. Left ventricular ejection fraction, by estimation, is 60 to 65%. The  left ventricle has normal function. The left ventricle has no regional  wall motion abnormalities. There is mild concentric left ventricular  hypertrophy. Left ventricular diastolic  parameters are consistent with Grade II diastolic dysfunction  (pseudonormalization). Elevated left atrial pressure.   2. Right ventricular systolic function is mildly reduced. The right  ventricular size is not well visualized. Tricuspid regurgitation signal is  inadequate for assessing PA pressure.   3. Left atrial size was mild to moderately dilated.   4. The mitral valve is normal in structure. No evidence of mitral valve  regurgitation. No evidence of mitral stenosis.   5. The aortic valve is normal in structure. Aortic valve regurgitation is  trivial. Aortic valve sclerosis/calcification is present, without any  evidence of aortic stenosis.   6. Aortic Normal DTA.   7. The inferior vena cava is normal in size with greater than 50%  respiratory variability, suggesting right atrial pressure of 3 mmHg. .  Carotid duplex 02/14/2022: Impression: Right: Color duplex indicates moderate heterogeneous and calcified plaque, with no hemodynamically significant stenosis by duplex criteria in the extracranial cerebrovascular circulation. Left: Heterogeneous and partially calcified plaque at the left carotid bifurcation, contributing to 50 to 69% stenosis by established duplex criteria.  Note the flow velocity of the left ICA were obtained from an area distal to the maximum narrowing due to the presence of anterior wall plaque with shadowing and may be underestimating percentage of ICA stenosis.  Further evaluation is warranted, consider formal cerebral angiogram or a second best, CT angiogram.  30-day event monitor 12/01/2021: Rhythm throughout with sinus sinus bradycardia was  noted to minimum rate of 40 bpm. Are no pauses of 3 seconds or greater there were no episodes of second or third-degree AV block.  There are no episodes of complex ventricular arrhythmia atrial fibrillation or flutter. There were no symptomatic events.  Nuclear stress 10/06/2015 (scanned under media tab correspondence 02/02/2018): Impression: 1.  No ischemia seen on the scan. 2.  Normal gated images. 3.  Normal ejection fraction calculated to be at 54%.  Fixed defect involving inferior and inferior lateral walls probably related to diaphragmatic attenuation.     [place], [place] [facility] Short Stay Center/Anesthesiology Phone [phone] 04/18/2022 1:27 PM

## 2022-04-19 ENCOUNTER — Encounter (HOSPITAL_COMMUNITY)
Admission: RE | Disposition: A | Discharge status: Home / Self Care | Payer: Self-pay | Source: Ambulatory Visit | Attending: Neurosurgery

## 2022-04-19 ENCOUNTER — Ambulatory Visit (HOSPITAL_COMMUNITY): Payer: Medicare Other | Admitting: Physician Assistant

## 2022-04-19 ENCOUNTER — Ambulatory Visit (HOSPITAL_COMMUNITY)
Admission: RE | Admit: 2022-04-19 | Discharge: 2022-04-19 | Disposition: A | Discharge status: Home / Self Care | Payer: Medicare Other | Source: Ambulatory Visit | Attending: Neurosurgery | Admitting: Neurosurgery

## 2022-04-19 ENCOUNTER — Encounter (HOSPITAL_COMMUNITY): Payer: Self-pay | Admitting: Neurosurgery

## 2022-04-19 ENCOUNTER — Ambulatory Visit (HOSPITAL_BASED_OUTPATIENT_CLINIC_OR_DEPARTMENT_OTHER): Payer: Medicare Other | Admitting: Physician Assistant

## 2022-04-19 DIAGNOSIS — Z87891 Personal history of nicotine dependence: Secondary | ICD-10-CM | POA: Diagnosis not present

## 2022-04-19 DIAGNOSIS — H8121 Vestibular neuronitis, right ear: Secondary | ICD-10-CM

## 2022-04-19 DIAGNOSIS — G319 Degenerative disease of nervous system, unspecified: Secondary | ICD-10-CM | POA: Diagnosis not present

## 2022-04-19 DIAGNOSIS — Z01818 Encounter for other preprocedural examination: Secondary | ICD-10-CM | POA: Insufficient documentation

## 2022-04-19 DIAGNOSIS — I2510 Atherosclerotic heart disease of native coronary artery without angina pectoris: Secondary | ICD-10-CM | POA: Diagnosis not present

## 2022-04-19 DIAGNOSIS — R42 Dizziness and giddiness: Secondary | ICD-10-CM | POA: Diagnosis not present

## 2022-04-19 DIAGNOSIS — I252 Old myocardial infarction: Secondary | ICD-10-CM

## 2022-04-19 DIAGNOSIS — I110 Hypertensive heart disease with heart failure: Secondary | ICD-10-CM | POA: Diagnosis not present

## 2022-04-19 DIAGNOSIS — I509 Heart failure, unspecified: Secondary | ICD-10-CM

## 2022-04-19 HISTORY — PX: RADIOLOGY WITH ANESTHESIA: SHX6223

## 2022-04-19 HISTORY — DX: Acute myocardial infarction, unspecified: I21.9

## 2022-04-19 LAB — CBC
HCT: 41.1 % (ref 39.0–52.0)
Hemoglobin: 14.5 g/dL (ref 13.0–17.0)
MCH: 33.2 pg (ref 26.0–34.0)
MCHC: 35.3 g/dL (ref 30.0–36.0)
MCV: 94.1 fL (ref 80.0–100.0)
Platelets: 164 10*3/uL (ref 150–400)
RBC: 4.37 MIL/uL (ref 4.22–5.81)
RDW: 12.9 % (ref 11.5–15.5)
WBC: 5.6 10*3/uL (ref 4.0–10.5)
nRBC: 0 % (ref 0.0–0.2)

## 2022-04-19 LAB — BASIC METABOLIC PANEL
Anion gap: 10 (ref 5–15)
BUN: 14 mg/dL (ref 8–23)
CO2: 25 mmol/L (ref 22–32)
Calcium: 9 mg/dL (ref 8.9–10.3)
Chloride: 105 mmol/L (ref 98–111)
Creatinine, Ser: 1.21 mg/dL (ref 0.61–1.24)
GFR, Estimated: >60 mL/min (ref >60)
Glucose, Bld: 90 mg/dL (ref 70–99)
Potassium: 3.7 mmol/L (ref 3.5–5.1)
Sodium: 140 mmol/L (ref 135–145)

## 2022-04-19 SURGERY — MRI WITH ANESTHESIA
Anesthesia: General

## 2022-04-19 MED ORDER — ORAL CARE MOUTH RINSE
15.0000 mL | Freq: Once | OROMUCOSAL | Status: AC
Start: 2022-04-19 — End: 2022-04-19

## 2022-04-19 MED ORDER — SUCCINYLCHOLINE CHLORIDE 200 MG/10ML IV SOSY
PREFILLED_SYRINGE | INTRAVENOUS | Status: DC | PRN
  Administered 2022-04-19: 100 mg via INTRAVENOUS

## 2022-04-19 MED ORDER — ONDANSETRON HCL 4 MG/2ML IJ SOLN: 4.0000 mg | Freq: Once | INTRAMUSCULAR | Status: DC | PRN

## 2022-04-19 MED ORDER — ROCURONIUM BROMIDE 10 MG/ML (PF) SYRINGE
PREFILLED_SYRINGE | INTRAVENOUS | Status: DC | PRN
  Administered 2022-04-19: 40 mg via INTRAVENOUS

## 2022-04-19 MED ORDER — PROPOFOL 10 MG/ML IV BOLUS
INTRAVENOUS | Status: DC | PRN
  Administered 2022-04-19: 20 mg via INTRAVENOUS
  Administered 2022-04-19: 130 mg via INTRAVENOUS

## 2022-04-19 MED ORDER — ONDANSETRON HCL 4 MG/2ML IJ SOLN
INTRAMUSCULAR | Status: DC | PRN
  Administered 2022-04-19: 4 mg via INTRAVENOUS

## 2022-04-19 MED ORDER — PHENYLEPHRINE HCL-NACL 20-0.9 MG/250ML-% IV SOLN
INTRAVENOUS | Status: DC | PRN
  Administered 2022-04-19: 25 ug/min via INTRAVENOUS

## 2022-04-19 MED ORDER — LACTATED RINGERS IV SOLN: INTRAVENOUS | Status: DC

## 2022-04-19 MED ORDER — GADOBUTROL 1 MMOL/ML IV SOLN
10.0000 mL | Freq: Once | INTRAVENOUS | Status: AC | PRN
  Administered 2022-04-19: 10 mL via INTRAVENOUS

## 2022-04-19 MED ORDER — SUGAMMADEX SODIUM 200 MG/2ML IV SOLN
INTRAVENOUS | Status: DC | PRN
  Administered 2022-04-19: 200 mg via INTRAVENOUS

## 2022-04-19 MED ORDER — CHLORHEXIDINE GLUCONATE 0.12 % MT SOLN
15.0000 mL | Freq: Once | OROMUCOSAL | Status: AC
  Administered 2022-04-19: 15 mL via OROMUCOSAL
  Filled 2022-04-19: qty 15

## 2022-04-19 MED ORDER — FENTANYL CITRATE (PF) 100 MCG/2ML IJ SOLN: 25.0000 ug | INTRAMUSCULAR | Status: DC | PRN

## 2022-04-19 MED ORDER — DEXAMETHASONE SODIUM PHOSPHATE 10 MG/ML IJ SOLN
INTRAMUSCULAR | Status: DC | PRN
  Administered 2022-04-19: 10 mg via INTRAVENOUS

## 2022-04-19 MED ORDER — LACTATED RINGERS IV SOLN: INTRAVENOUS | Status: DC | PRN

## 2022-04-19 MED ORDER — FENTANYL CITRATE (PF) 100 MCG/2ML IJ SOLN
INTRAMUSCULAR | Status: DC | PRN
  Administered 2022-04-19 (×2): 50 ug via INTRAVENOUS

## 2022-04-19 MED ORDER — LIDOCAINE 2% (20 MG/ML) 5 ML SYRINGE
INTRAMUSCULAR | Status: DC | PRN
  Administered 2022-04-19: 80 mg via INTRAVENOUS

## 2022-04-19 NOTE — Anesthesia Postprocedure Evaluation (Signed)
Anesthesia Post Note  Patient: John Kemp  Procedure(s) Performed: MRI WITH BRAIN WITH AND WITHOUT CONTRAST     Patient location during evaluation: PACU Anesthesia Type: General Level of consciousness: awake and alert Pain management: pain level controlled Vital Signs Assessment: post-procedure vital signs reviewed and stable Respiratory status: spontaneous breathing, nonlabored ventilation, respiratory function stable and patient connected to nasal cannula oxygen Cardiovascular status: blood pressure returned to baseline and stable Postop Assessment: no apparent nausea or vomiting Anesthetic complications: no   No notable events documented.  Last Vitals:  Vitals:   04/19/22 0945 04/19/22 1000  BP: 124/62 132/66  Pulse: 62 (!) 58  Resp: 20 15  Temp:  36.4 C  SpO2: 94% 93%    Last Pain:  Vitals:   04/19/22 1000  TempSrc:   PainSc: 0-No pain                 Santa Lighter

## 2022-04-19 NOTE — H&P (Signed)
Anesthesia H&P Update: History and Physical Exam reviewed; patient is OK for planned anesthetic and procedure. ? ?

## 2022-04-19 NOTE — Addendum Note (Signed)
Addendum  created 04/19/22 1533 by Renato Shin, CRNA   Intraprocedure Event edited

## 2022-04-19 NOTE — Anesthesia Procedure Notes (Signed)
Procedure Name: Intubation Date/Time: 04/19/2022 8:18 AM  Performed by: Harden Mo, CRNAPre-anesthesia Checklist: Patient identified, Emergency Drugs available, Suction available and Patient being monitored Patient Re-evaluated:Patient Re-evaluated prior to induction Oxygen Delivery Method: Circle System Utilized Preoxygenation: Pre-oxygenation with 100% oxygen Induction Type: IV induction Ventilation: Mask ventilation without difficulty Laryngoscope Size: Miller and 2 Grade View: Grade I Tube type: Oral Tube size: 7.5 mm Number of attempts: 1 Airway Equipment and Method: Stylet and Oral airway Placement Confirmation: ETT inserted through vocal cords under direct vision, positive ETCO2 and breath sounds checked- equal and bilateral Secured at: 23 cm Tube secured with: Tape Dental Injury: Teeth and Oropharynx as per pre-operative assessment

## 2022-04-19 NOTE — Transfer of Care (Signed)
Immediate Anesthesia Transfer of Care Note  Patient: John Kemp  Procedure(s) Performed: MRI WITH BRAIN WITH AND WITHOUT CONTRAST  Patient Location: PACU  Anesthesia Type:General  Level of Consciousness: awake, alert  and oriented  Airway & Oxygen Therapy: Patient Spontanous Breathing  Post-op Assessment: Report given to RN, Post -op Vital signs reviewed and stable and Patient moving all extremities X 4  Post vital signs: Reviewed and stable  Last Vitals:  Vitals Value Taken Time  BP 118/67 04/19/22 0930  Temp    Pulse 78 04/19/22 0931  Resp 23 04/19/22 0931  SpO2 93 % 04/19/22 0931  Vitals shown include unvalidated device data.  Last Pain:  Vitals:   04/19/22 0722  TempSrc:   PainSc: 0-No pain         Complications: No notable events documented.

## 2022-04-20 ENCOUNTER — Encounter (HOSPITAL_COMMUNITY): Payer: Self-pay | Admitting: Radiology

## 2022-04-24 ENCOUNTER — Other Ambulatory Visit: Payer: Self-pay | Admitting: Cardiology

## 2022-04-24 DIAGNOSIS — I5032 Chronic diastolic (congestive) heart failure: Secondary | ICD-10-CM

## 2022-04-25 DIAGNOSIS — Z6837 Body mass index (BMI) 37.0-37.9, adult: Secondary | ICD-10-CM | POA: Diagnosis not present

## 2022-04-25 DIAGNOSIS — M6281 Muscle weakness (generalized): Secondary | ICD-10-CM | POA: Diagnosis not present

## 2022-04-25 NOTE — Telephone Encounter (Signed)
Rx refill sent to pharmacy. 

## 2022-05-05 ENCOUNTER — Ambulatory Visit: Payer: Medicare Other | Attending: Cardiology | Admitting: Cardiology

## 2022-05-05 ENCOUNTER — Encounter: Payer: Self-pay | Admitting: Cardiology

## 2022-05-05 VITALS — BP 132/68 | HR 58 | Ht 70.0 in | Wt 237.4 lb

## 2022-05-05 DIAGNOSIS — I447 Left bundle-branch block, unspecified: Secondary | ICD-10-CM | POA: Insufficient documentation

## 2022-05-05 DIAGNOSIS — I2510 Atherosclerotic heart disease of native coronary artery without angina pectoris: Secondary | ICD-10-CM | POA: Diagnosis not present

## 2022-05-05 DIAGNOSIS — Z79899 Other long term (current) drug therapy: Secondary | ICD-10-CM | POA: Insufficient documentation

## 2022-05-05 DIAGNOSIS — I480 Paroxysmal atrial fibrillation: Secondary | ICD-10-CM | POA: Diagnosis not present

## 2022-05-05 DIAGNOSIS — R001 Bradycardia, unspecified: Secondary | ICD-10-CM | POA: Diagnosis not present

## 2022-05-05 DIAGNOSIS — I110 Hypertensive heart disease with heart failure: Secondary | ICD-10-CM | POA: Insufficient documentation

## 2022-05-05 DIAGNOSIS — E7800 Pure hypercholesterolemia, unspecified: Secondary | ICD-10-CM | POA: Diagnosis not present

## 2022-05-05 DIAGNOSIS — Z7901 Long term (current) use of anticoagulants: Secondary | ICD-10-CM | POA: Insufficient documentation

## 2022-05-05 DIAGNOSIS — I5032 Chronic diastolic (congestive) heart failure: Secondary | ICD-10-CM | POA: Diagnosis not present

## 2022-05-05 NOTE — Patient Instructions (Signed)
Medication Instructions:  Your physician recommends that you continue on your current medications as directed. Please refer to the Current Medication list given to you today.  *If you need a refill on your cardiac medications before your next appointment, please call your pharmacy*   Lab Work: None If you have labs (blood work) drawn today and your tests are completely normal, you will receive your results only by: MyChart Message (if you have MyChart) OR A paper copy in the mail If you have any lab test that is abnormal or we need to change your treatment, we will call you to review the results.   Testing/Procedures: None   Follow-Up: At [facility], you and your health needs are our priority.  As part of our continuing mission to provide you with exceptional heart care, we have created designated Provider Care Teams.  These Care Teams include your primary Cardiologist (physician) and Advanced Practice Providers (APPs -  Physician Assistants and Nurse Practitioners) who all work together to provide you with the care you need, when you need it.  We recommend signing up for the patient portal called "MyChart".  Sign up information is provided on this After Visit Summary.  MyChart is used to connect with patients for Virtual Visits (Telemedicine).  Patients are able to view lab/test results, encounter notes, upcoming appointments, etc.  Non-urgent messages can be sent to your provider as well.   To learn more about what you can do with MyChart, go to [email or web address].    Your next appointment:   6 month(s)  The format for your next appointment:   In Person  Provider:   Brian Munley, MD    Other Instructions None  Important Information About Sugar       

## 2022-05-05 NOTE — Progress Notes (Signed)
Cardiology Office Note:    Date:  05/05/2022   ID:  John Kemp, DOB June 07, 1946, MRN [id number]  PCP:  [name], MD  Cardiologist:  Shirlee More, MD    Referring MD: [name], MD    ASSESSMENT:    1. PAF (paroxysmal atrial fibrillation) ([place])   2. On amiodarone therapy   3. Anticoagulated   4. Sinus bradycardia   5. LBBB (left bundle branch block)   6. Hypertensive heart disease with chronic diastolic congestive heart failure ([place])   7. CAD in native artery   8. Pure hypercholesterolemia    PLAN:    In order of problems listed above:  Evert continues to maintain sinus rhythm on low-dose amiodarone and with his previous bradycardia he is on no AV nodal agent for rate suppression.  Continue current treatment no signs of thyroid toxicity.  I did tell him at times disequilibrium could happen with the drug but with his pulsatile tinnitus I think he has a primary ENT problem. Continue his current anticoagulant well-tolerated and effective Stable EKG pattern Well-controlled BP at target continue his loop diuretic along with calcium channel blocker and valsartan Stable CAD having no anginal discomfort continue his current treatment His lipids is at target continue high intensity statin   Next appointment: 6 months   Medication Adjustments/Labs and Tests Ordered: Current medicines are reviewed at length with the patient today.  Concerns regarding medicines are outlined above.  Orders Placed This Encounter  Procedures   EKG 12-Lead   No orders of the defined types were placed in this encounter.   Chief Complaint  Patient presents with   Atrial Fibrillation    On amiodarone   Congestive Heart Failure   Coronary Artery Disease    History of Present Illness:    John Kemp is a 76 y.o. male with a hx of paroxysmal atrial fibrillation and flutter maintaining sinus rhythm on amiodarone chronic anticoagulation CAD hypertension with heart failure diastolic  bilateral less than 50% ICA stenosis on carotid duplex hyperlipidemia and sinus bradycardia last seen 01/10/2022.  He has had several ED visits [facility] as well as [facility].  On 11/09/2021 at [facility] he was in sinus bradycardia with nonspecific conduction delay had a wide-complex rhythm that was interpreted as atrial fibrillation is more consistent with rate related bundle branch block or accelerated idioventricular rhythm.   Andrw was recently seen by vascular surgery for his left carotid stenosis felt to be asymptomatic and not to require intervention at this time.  He was pending an ENT evaluation for his pulsatile tinnitus and balance issues.  Compliance with diet, lifestyle and medications: Yes  Balance problem seems to be improving he has ongoing tinnitus has an appoint with ENT and is also staring the [facility] about his hearing deficit and hearing aids I told him is possible at times that amiodarone can give disequilibrium but I think he has a primary ENT problem He has had no syncope palpitation edema shortness of breath or chest pain. Labs show normal thyroid and LDL is at target Tolerates lipid-lowering without muscle pain or weakness Past Medical History:  Diagnosis Date   A-fib ([place])    Acquired thrombophilia ([place]) 11/17/2019   Acute bronchitis due to other specified organisms 10/28/2021   Acute cough 10/20/2021   Acute non-recurrent maxillary sinusitis 10/28/2021   Anticoagulated 01/21/2015   Atherosclerotic heart disease of native coronary artery without angina pectoris    Atrial fibrillation with rapid ventricular  response ([place]) 11/12/2021   Atrial flutter with rapid ventricular response ([place]) 11/12/2021   CAD in native artery 01/21/2015   Cancer (HCC)    skin cancer   CHF (congestive heart failure) ([place]) 11/05/2019   Chronic diastolic (congestive) heart failure (HCC)    Chronic diastolic heart failure ([place]) 01/21/2015   Last Assessment & Plan:   Is congestive heart failure is mildly decompensated he is edematous he'll increase the dose of his diuretic today the lab work performed including an ARB referred to care transitions for home care he'll follow-up in my office with me in 4-6 weeks daily weights sodium restrictionand compliance of medications beinghemoglobin of will   Chronic obstructive pulmonary disease (COPD) ([place])    Coronary artery disease involving autologous artery coronary bypass graft 11/12/2021   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 11/17/2019   Dizziness 11/12/2021   Dyslipidemia 11/12/2021   Elevated brain natriuretic peptide (BNP) level 11/12/2021   Essential hypertension 01/21/2015   right superior quadrantanopsia   Hearing loss of right ear due to cerumen impaction 10/20/2021   High cholesterol 10/20/2017   Hx of CABG 03/02/2017   Hypertension    Hypertensive heart disease with heart failure ([place])    Hypothyroidism (acquired) 04/24/2018   Impaired fasting glucose    Left rotator cuff tear 11/12/2021   Mixed hyperlipidemia 11/17/2019   Myocardial infarction ([place])    Non-seasonal allergic rhinitis due to pollen 10/20/2021   On amiodarone therapy 01/21/2015   PAF (paroxysmal atrial fibrillation) (HCC)    Primary insomnia    Sequela of cardioembolic stroke 65/99/3570   Severe obesity with body mass index (BMI) of 36.0 to 36.9 with serious comorbidity ([place])    Squamous cell carcinoma 11/05/2019   Typical atrial flutter ([place]) 01/21/2015   Vitamin D deficiency, unspecified     Past Surgical History:  Procedure Laterality Date   APPENDECTOMY  1956   CARDIAC CATHETERIZATION     CARDIOVERSION  2002   CHOLECYSTECTOMY  2006   CORONARY ARTERY BYPASS GRAFT  2001   HERNIA REPAIR     RADIOLOGY WITH ANESTHESIA Bilateral 01/18/2018   Procedure: MRI WITH ANESTHESIA LUMBAR SPINE WITH AND WITHOUT CONTRAST;  Surgeon: Radiologist, Medication, MD;  Location: [facility];  Service: Radiology;  Laterality:  Bilateral;   RADIOLOGY WITH ANESTHESIA N/A 04/19/2022   Procedure: MRI WITH BRAIN WITH AND WITHOUT CONTRAST;  Surgeon: Radiologist, Medication, MD;  Location: [facility];  Service: Radiology;  Laterality: N/A;   ROTATOR CUFF REPAIR Left    TONSILLECTOMY  1975    Current Medications: Current Meds  Medication Sig   acetaminophen (TYLENOL) 500 MG tablet Take 500 mg by mouth 2 (two) times daily.   amiodarone (PACERONE) 200 MG tablet Take 1 tablet (200 mg total) by mouth daily.   amLODipine (NORVASC) 5 MG tablet TAKE 1 TABLET (5 MG TOTAL) BY MOUTH DAILY.   atorvastatin (LIPITOR) 20 MG tablet Take 1 tablet (20 mg total) by mouth daily.   azelastine (ASTELIN) 0.1 % nasal spray Place 2 sprays into both nostrils 2 (two) times daily. Use in each nostril as directed   cefdinir (OMNICEF) 300 MG capsule Take 1 capsule (300 mg total) by mouth 2 (two) times daily.   ELIQUIS 5 MG TABS tablet TAKE 1 TABLET BY MOUTH TWICE A DAY   finasteride (PROSCAR) 5 MG tablet Take 1 tablet (5 mg total) by mouth daily.   fluticasone (FLONASE) 50 MCG/ACT nasal spray Place 2 sprays into both nostrils daily.  furosemide (LASIX) 40 MG tablet Take 1 tablet (40 mg total) by mouth daily as needed for fluid or edema.   Inulin (FIBER CHOICE PO) Take 3 tablets by mouth daily as needed (Constipation).   levothyroxine (SYNTHROID) 100 MCG tablet Take 1 tablet (100 mcg total) by mouth daily.   nitroGLYCERIN (NITROSTAT) 0.4 MG SL tablet Place 0.4 mg under the tongue every 5 (five) minutes as needed for chest pain.   potassium chloride (KLOR-CON M) 10 MEQ tablet Take 10 mEq by mouth daily as needed (take with furosemide).   tamsulosin (FLOMAX) 0.4 MG CAPS capsule Take 2 capsules (0.8 mg total) by mouth daily. (Patient taking differently: Take 0.4 mg by mouth daily.)   valsartan (DIOVAN) 320 MG tablet Take 1 tablet (320 mg total) by mouth daily.   Vitamin D, Ergocalciferol, (DRISDOL) 1.25 MG (50000 UNIT) CAPS capsule Take 1 capsule (50,000  Units total) by mouth every 7 (seven) days.   zolpidem (AMBIEN) 10 MG tablet Take 1 tablet (10 mg total) by mouth at bedtime.     Allergies:   Avelox [moxifloxacin hcl], Cartia xt [diltiazem hcl], Pravachol [pravastatin sodium], Prednisone, and Zetia [ezetimibe]   Social History   Socioeconomic History   Marital status: Married    Spouse name: Not on file   Number of children: Not on file   Years of education: Not on file   Highest education level: Not on file  Occupational History   Occupation: Retired  Tobacco Use   Smoking status: Former    Packs/day: 1.00    Years: 15.00    Total pack years: 15.00    Types: Cigarettes    Quit date: 08/29/1978    Years since quitting: 43.7    Passive exposure: Past   Smokeless tobacco: Never  Vaping Use   Vaping Use: Never used  Substance and Sexual Activity   Alcohol use: No    Alcohol/week: 0.0 standard drinks of alcohol   Drug use: No   Sexual activity: Not Currently  Other Topics Concern   Not on file  Social History Narrative   Not on file   Social Determinants of Health   Financial Resource Strain: High Risk (01/28/2022)   Overall Financial Resource Strain (CARDIA)    Difficulty of Paying Living Expenses: Hard  Food Insecurity: No Food Insecurity (12/07/2020)   Hunger Vital Sign    Worried About Running Out of Food in the Last Year: Never true    Ran Out of Food in the Last Year: Never true  Transportation Needs: No Transportation Needs (01/28/2022)   PRAPARE - Hydrologist (Medical): No    Lack of Transportation (Non-Medical): No  Physical Activity: Not on file  Stress: Not on file  Social Connections: Not on file     Family History: The patient's family history includes Arrhythmia in his father; Breast cancer in his mother; Diabetes in his brother; Diabetes type II in his sister; Heart disease in his father. ROS:   Please see the history of present illness.    All other systems reviewed and  are negative.  EKGs/Labs/Other Studies Reviewed:    The following studies were reviewed today:  Event monitor 01/05/2022: Study Highlights 30-day event monitor was performed 12/01/2021 to 12/30/2021. Rhythm throughout with sinus sinus bradycardia was noted to minimum rate of 40 bpm. Are no pauses of 3 seconds or greater there were no episodes of second or third-degree AV block.  There are no episodes of complex ventricular  arrhythmia atrial fibrillation or flutter. There were no symptomatic events.  TTE 02/15/2022:  1. Left ventricular ejection fraction, by estimation, is 60 to 65%. The  left ventricle has normal function. The left ventricle has no regional  wall motion abnormalities. There is mild concentric left ventricular  hypertrophy. Left ventricular diastolic  parameters are consistent with Grade II diastolic dysfunction  (pseudonormalization). Elevated left atrial pressure.   2. Right ventricular systolic function is mildly reduced. The right  ventricular size is not well visualized. Tricuspid regurgitation signal is  inadequate for assessing PA pressure.   3. Left atrial size was mild to moderately dilated.   4. The mitral valve is normal in structure. No evidence of mitral valve  regurgitation. No evidence of mitral stenosis.   5. The aortic valve is normal in structure. Aortic valve regurgitation is  trivial. Aortic valve sclerosis/calcification is present, without any  evidence of aortic stenosis.   6. Aortic Normal DTA.   7. The inferior vena cava is normal in size with greater than 50%  respiratory variability, suggesting right atrial pressure of 3 mmHg. .   Carotid duplex 02/14/2022: Impression: Right: Color duplex indicates moderate heterogeneous and calcified plaque, with no hemodynamically significant stenosis by duplex criteria in the extracranial cerebrovascular circulation. Left: Heterogeneous and partially calcified plaque at the left carotid bifurcation,  contributing to 50 to 69% stenosis by established duplex criteria.  Note the flow velocity of the left ICA were obtained from an area distal to the maximum narrowing due to the presence of anterior wall plaque with shadowing and may be underestimating percentage of ICA stenosis.  Further evaluation is warranted, consider formal cerebral angiogram or a second best, CT angiogram.  EKG:  EKG ordered today and personally reviewed.  The ekg ordered today demonstrates sinus rhythm 58 bpm nonspecific conduction delay  Recent Labs: 11/29/2021: B Natriuretic Peptide 136.3; Magnesium 2.1 02/09/2022: ALT 19 02/28/2022: TSH 4.050 04/19/2022: BUN 14; Creatinine, Ser 1.21; Hemoglobin 14.5; Platelets 164; Potassium 3.7; Sodium 140  Recent Lipid Panel    Component Value Date/Time   CHOL 143 02/09/2022 1209   TRIG 92 02/09/2022 1209   HDL 48 02/09/2022 1209   CHOLHDL 3.0 02/09/2022 1209   CHOLHDL 5.9 04/02/2017 0213   VLDL 35 04/02/2017 0213   LDLCALC 78 02/09/2022 1209    Physical Exam:    VS:  BP 132/68 (BP Location: Right Arm, Patient Position: Sitting)   Pulse (!) 58   Ht '5\' 10"'$  (1.778 m)   Wt 237 lb 6.4 oz (107.7 kg)   SpO2 96%   BMI 34.06 kg/m     Wt Readings from Last 3 Encounters:  05/05/22 237 lb 6.4 oz (107.7 kg)  04/19/22 230 lb (104.3 kg)  03/29/22 230 lb (104.3 kg)     GEN:  Well nourished, well developed in no acute distress HEENT: Normal NECK: No JVD; No carotid bruits LYMPHATICS: No lymphadenopathy CARDIAC: RRR, no murmurs, rubs, gallops RESPIRATORY:  Clear to auscultation without rales, wheezing or rhonchi  ABDOMEN: Soft, non-tender, non-distended MUSCULOSKELETAL:  No edema; No deformity  SKIN: Warm and dry NEUROLOGIC:  Alert and oriented x 3 PSYCHIATRIC:  Normal affect    Signed, Shirlee More, MD  05/05/2022 2:04 PM    [place]

## 2022-05-09 ENCOUNTER — Telehealth: Payer: Self-pay

## 2022-05-09 NOTE — Chronic Care Management (AMB) (Signed)
Chronic Care Management Pharmacy Assistant   Name: John Kemp  MRN: [id number] DOB: 1945-12-03  Reason for Encounter: Disease State/ Hypertension  Recent office visits:  None  Recent consult visits:  05-05-2022 [name], MD (Cardiology). Patient reported not taking donepezil. EKG completed.  04-19-2022 Ashok Pall, MD. MRI w/ brain with and w/o contrast completed.  Hospital visits:  Medication Reconciliation was completed by comparing discharge summary, patient's EMR and Pharmacy list, and upon discussion with patient.   Admitted to the hospital on 02-02-2022 due to Near syncope.Marland Kitchen Discharge date was 02-02-2022. Discharged from [place]?Medications Started at [facility] Discharge:?? None   Medication Changes at Hospital Discharge: None   Medications Discontinued at Hospital Discharge: None   Medications that remain the same after Hospital Discharge:??  -All other medications will remain the same.    Medications: Outpatient Encounter Medications as of 05/09/2022  Medication Sig   acetaminophen (TYLENOL) 500 MG tablet Take 500 mg by mouth 2 (two) times daily.   amiodarone (PACERONE) 200 MG tablet Take 1 tablet (200 mg total) by mouth daily.   amLODipine (NORVASC) 5 MG tablet TAKE 1 TABLET (5 MG TOTAL) BY MOUTH DAILY.   atorvastatin (LIPITOR) 20 MG tablet Take 1 tablet (20 mg total) by mouth daily.   azelastine (ASTELIN) 0.1 % nasal spray Place 2 sprays into both nostrils 2 (two) times daily. Use in each nostril as directed   cefdinir (OMNICEF) 300 MG capsule Take 1 capsule (300 mg total) by mouth 2 (two) times daily.   ELIQUIS 5 MG TABS tablet TAKE 1 TABLET BY MOUTH TWICE A DAY   finasteride (PROSCAR) 5 MG tablet Take 1 tablet (5 mg total) by mouth daily.   fluticasone (FLONASE) 50 MCG/ACT nasal spray Place 2 sprays into both nostrils daily.   furosemide (LASIX) 40 MG tablet Take 1 tablet (40 mg total) by mouth daily as needed for fluid or  edema.   Inulin (FIBER CHOICE PO) Take 3 tablets by mouth daily as needed (Constipation).   levothyroxine (SYNTHROID) 100 MCG tablet Take 1 tablet (100 mcg total) by mouth daily.   nitroGLYCERIN (NITROSTAT) 0.4 MG SL tablet Place 0.4 mg under the tongue every 5 (five) minutes as needed for chest pain.   potassium chloride (KLOR-CON M) 10 MEQ tablet Take 10 mEq by mouth daily as needed (take with furosemide).   tamsulosin (FLOMAX) 0.4 MG CAPS capsule Take 2 capsules (0.8 mg total) by mouth daily. (Patient taking differently: Take 0.4 mg by mouth daily.)   valsartan (DIOVAN) 320 MG tablet Take 1 tablet (320 mg total) by mouth daily.   Vitamin D, Ergocalciferol, (DRISDOL) 1.25 MG (50000 UNIT) CAPS capsule Take 1 capsule (50,000 Units total) by mouth every 7 (seven) days.   zolpidem (AMBIEN) 10 MG tablet Take 1 tablet (10 mg total) by mouth at bedtime.   No facility-administered encounter medications on file as of 05/09/2022.   Recent Office Vitals: BP Readings from Last 3 Encounters:  05/05/22 132/68  04/19/22 132/66  03/29/22 120/60   Pulse Readings from Last 3 Encounters:  05/05/22 (!) 58  04/19/22 (!) 58  03/29/22 67    Wt Readings from Last 3 Encounters:  05/05/22 237 lb 6.4 oz (107.7 kg)  04/19/22 230 lb (104.3 kg)  03/29/22 230 lb (104.3 kg)     Kidney Function Lab Results  Component Value Date/Time   CREATININE 1.21 04/19/2022 07:21 AM   CREATININE 1.19 02/09/2022 12:09 PM  GFRNONAA >60 04/19/2022 07:21 AM   GFRAA 54 (L) 09/21/2020 10:05 AM       Latest Ref Rng & Units 04/19/2022    7:21 AM 02/09/2022   12:09 PM 02/02/2022    3:15 PM  BMP  Glucose 70 - 99 mg/dL 90  81  123   BUN 8 - 23 mg/dL '14  17  18   '$ Creatinine 0.61 - 1.24 mg/dL 1.21  1.19  1.44   BUN/Creat Ratio 10 - 24  14    Sodium 135 - 145 mmol/L 140  141  138   Potassium 3.5 - 5.1 mmol/L 3.7  4.5  3.7   Chloride 98 - 111 mmol/L 105  101  106   CO2 22 - 32 mmol/L '25  25  25   '$ Calcium 8.9 - 10.3 mg/dL 9.0   9.1  9.1      Current antihypertensive regimen:  Amlodipine 5 mg bid Furosemide 40 mg Prn Valsartan 320 mg daily Potassium 10 meq daily prn   Patient verbally confirms he is taking the above medications as directed. Yes  How often are you checking your Blood Pressure? infrequently  he checks his blood pressure in the morning after taking his medication.  Current home BP readings: 130/78 60, 128/72 58 Wrist or arm cuff: Arm cuff Caffeine intake: 1 12 oz diet pepsi weekly Salt intake: Limited OTC medications including pseudoephedrine or NSAIDs? None  Any readings above 180/120? No  What recent interventions/DTPs have been made by any provider to improve Blood Pressure control since last CPP Visit:  Educated on BP goals and benefits of medications for prevention of heart attack, stroke and kidney damage; Daily salt intake goal < 2300 mg; Exercise goal of 150 minutes per week; Importance of home blood pressure monitoring; -Counseled to monitor BP at home weekly, document, and provide log at future appointments -Counseled on diet and exercise extensively Recommended to continue current medication Recommended beginning to check bp weekly at home   Any recent hospitalizations or ED visits since last visit with CPP? Yes  What diet changes have been made to improve Blood Pressure Control?  Patient stated he doesn't add salt to food and drinks plenty of water daily.  What exercise is being done to improve your Blood Pressure Control?  Patient states he moves around daily but doesn't exercise.   Adherence Review: Is the patient currently on ACE/ARB medication? Yes Does the patient have >5 day gap between last estimated fill dates? CPP to review  Care Gaps: Last annual wellness visit? 08-26-2021  Hep C screening overdue Shingrix overdue 3rd Moderna vaccine overdue last completed 12-18-2020 Flu vaccine overdue  Star Rating Drugs: Valsartan 320 mg- Last filled 03-04-2022 90  DS Atorvastatin 10 mg- Last filled 02-28-2022 90 DS  [facility] Clinical Pharmacist Assistant [phone]

## 2022-05-26 DIAGNOSIS — H9041 Sensorineural hearing loss, unilateral, right ear, with unrestricted hearing on the contralateral side: Secondary | ICD-10-CM

## 2022-05-26 DIAGNOSIS — H9311 Tinnitus, right ear: Secondary | ICD-10-CM

## 2022-05-26 DIAGNOSIS — H903 Sensorineural hearing loss, bilateral: Secondary | ICD-10-CM | POA: Diagnosis not present

## 2022-05-26 HISTORY — DX: Sensorineural hearing loss, unilateral, right ear, with unrestricted hearing on the contralateral side: H90.41

## 2022-05-26 HISTORY — DX: Tinnitus, right ear: H93.11

## 2022-05-28 ENCOUNTER — Other Ambulatory Visit: Payer: Self-pay | Admitting: Family Medicine

## 2022-06-03 ENCOUNTER — Encounter: Payer: Self-pay | Admitting: Family Medicine

## 2022-06-03 ENCOUNTER — Ambulatory Visit (INDEPENDENT_AMBULATORY_CARE_PROVIDER_SITE_OTHER): Payer: Medicare Other | Admitting: Family Medicine

## 2022-06-03 VITALS — BP 110/60 | HR 63 | Temp 97.9°F | Resp 15 | Ht 70.0 in | Wt 240.0 lb

## 2022-06-03 DIAGNOSIS — E039 Hypothyroidism, unspecified: Secondary | ICD-10-CM

## 2022-06-03 DIAGNOSIS — I110 Hypertensive heart disease with heart failure: Secondary | ICD-10-CM

## 2022-06-03 DIAGNOSIS — Z23 Encounter for immunization: Secondary | ICD-10-CM | POA: Diagnosis not present

## 2022-06-03 DIAGNOSIS — H918X1 Other specified hearing loss, right ear: Secondary | ICD-10-CM

## 2022-06-03 DIAGNOSIS — E559 Vitamin D deficiency, unspecified: Secondary | ICD-10-CM

## 2022-06-03 DIAGNOSIS — I4819 Other persistent atrial fibrillation: Secondary | ICD-10-CM | POA: Diagnosis not present

## 2022-06-03 DIAGNOSIS — E785 Hyperlipidemia, unspecified: Secondary | ICD-10-CM | POA: Diagnosis not present

## 2022-06-03 DIAGNOSIS — M6281 Muscle weakness (generalized): Secondary | ICD-10-CM | POA: Diagnosis not present

## 2022-06-03 DIAGNOSIS — I5032 Chronic diastolic (congestive) heart failure: Secondary | ICD-10-CM

## 2022-06-03 DIAGNOSIS — N401 Enlarged prostate with lower urinary tract symptoms: Secondary | ICD-10-CM | POA: Diagnosis not present

## 2022-06-03 DIAGNOSIS — E538 Deficiency of other specified B group vitamins: Secondary | ICD-10-CM | POA: Diagnosis not present

## 2022-06-03 DIAGNOSIS — R350 Frequency of micturition: Secondary | ICD-10-CM

## 2022-06-03 DIAGNOSIS — R7301 Impaired fasting glucose: Secondary | ICD-10-CM | POA: Diagnosis not present

## 2022-06-03 DIAGNOSIS — J449 Chronic obstructive pulmonary disease, unspecified: Secondary | ICD-10-CM

## 2022-06-03 HISTORY — DX: Deficiency of other specified B group vitamins: E53.8

## 2022-06-03 NOTE — Progress Notes (Signed)
Subjective:  Patient ID: John Kemp, male    DOB: 06/03/1946  Age: 76 y.o. MRN: [id number]  Chief Complaint  Patient presents with   Atrial Fibrillation   Congestive Heart Failure   Hyperlipidemia    HPI Afib: Taking Eliquis 5 mg twice a day, Amiodarone 200 mg daily.  Hypertension: Takes Amlodipine 5 mg daily, Valsartan 1 tablet  320 mg daily.  Hypothyroidism: Currently on Levothyroxine 100 mcg daily.  BPH: Tamsulosin 0.4 mg daily, Finasteride 5 mg daily.  Hyperlipidemia: Atorvastatin 20 mg every day.  Eating poorly. Not exercising.   Current Outpatient Medications on File Prior to Visit  Medication Sig Dispense Refill   acetaminophen (TYLENOL) 500 MG tablet Take 500 mg by mouth 2 (two) times daily.     amiodarone (PACERONE) 200 MG tablet Take 1 tablet (200 mg total) by mouth daily. 90 tablet 2   amLODipine (NORVASC) 5 MG tablet TAKE 1 TABLET (5 MG TOTAL) BY MOUTH DAILY. 90 tablet 1   atorvastatin (LIPITOR) 20 MG tablet TAKE 1 TABLET BY MOUTH EVERY DAY 90 tablet 0   azelastine (ASTELIN) 0.1 % nasal spray Place 2 sprays into both nostrils 2 (two) times daily. Use in each nostril as directed 30 mL 2   ELIQUIS 5 MG TABS tablet TAKE 1 TABLET BY MOUTH TWICE A DAY 180 tablet 1   finasteride (PROSCAR) 5 MG tablet Take 1 tablet (5 mg total) by mouth daily. 90 tablet 1   fluticasone (FLONASE) 50 MCG/ACT nasal spray SPRAY 2 SPRAYS INTO EACH NOSTRIL EVERY DAY 48 mL 2   furosemide (LASIX) 40 MG tablet Take 1 tablet (40 mg total) by mouth daily as needed for fluid or edema. 24 tablet 2   Inulin (FIBER CHOICE PO) Take 3 tablets by mouth daily as needed (Constipation).     levothyroxine (SYNTHROID) 100 MCG tablet Take 1 tablet (100 mcg total) by mouth daily. 90 tablet 1   nitroGLYCERIN (NITROSTAT) 0.4 MG SL tablet Place 0.4 mg under the tongue every 5 (five) minutes as needed for chest pain.     potassium chloride (KLOR-CON M) 10 MEQ tablet Take 10 mEq by mouth daily as needed (take  with furosemide).     tamsulosin (FLOMAX) 0.4 MG CAPS capsule Take 2 capsules (0.8 mg total) by mouth daily. (Patient taking differently: Take 0.4 mg by mouth daily.) 180 capsule 1   valsartan (DIOVAN) 320 MG tablet Take 1 tablet (320 mg total) by mouth daily. 90 tablet 1   Vitamin D, Ergocalciferol, (DRISDOL) 1.25 MG (50000 UNIT) CAPS capsule Take 1 capsule (50,000 Units total) by mouth every 7 (seven) days. 5 capsule 2   zolpidem (AMBIEN) 10 MG tablet Take 1 tablet (10 mg total) by mouth at bedtime. 30 tablet 3   No current facility-administered medications on file prior to visit.   Past Medical History:  Diagnosis Date   A-fib ([place])    Acquired thrombophilia ([place]) 11/17/2019   Acute bronchitis due to other specified organisms 10/28/2021   Acute cough 10/20/2021   Acute non-recurrent maxillary sinusitis 10/28/2021   Anticoagulated 01/21/2015   Atherosclerotic heart disease of native coronary artery without angina pectoris    Atrial fibrillation with rapid ventricular response ([place]) 11/12/2021   Atrial flutter with rapid ventricular response ([place]) 11/12/2021   CAD in native artery 01/21/2015   Cancer (HCC)    skin cancer   CHF (congestive heart failure) ([facility]) 11/05/2019   Chronic diastolic (congestive) heart failure (HCC)    Chronic diastolic  heart failure ([place]) 01/21/2015   Last Assessment & Plan:  Is congestive heart failure is mildly decompensated he is edematous he'll increase the dose of his diuretic today the lab work performed including an ARB referred to care transitions for home care he'll follow-up in my office with me in 4-6 weeks daily weights sodium restrictionand compliance of medications beinghemoglobin of will   Chronic obstructive pulmonary disease (COPD) ([place])    Coronary artery disease involving autologous artery coronary bypass graft 11/12/2021   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 11/17/2019   Dizziness 11/12/2021   Dyslipidemia 11/12/2021    Elevated brain natriuretic peptide (BNP) level 11/12/2021   Essential hypertension 01/21/2015   right superior quadrantanopsia   Hearing loss of right ear due to cerumen impaction 10/20/2021   High cholesterol 10/20/2017   Hx of CABG 03/02/2017   Hypertension    Hypertensive heart disease with heart failure ([place])    Hypothyroidism (acquired) 04/24/2018   Impaired fasting glucose    Left rotator cuff tear 11/12/2021   Mixed hyperlipidemia 11/17/2019   Myocardial infarction ([place])    Non-seasonal allergic rhinitis due to pollen 10/20/2021   On amiodarone therapy 01/21/2015   PAF (paroxysmal atrial fibrillation) (HCC)    Primary insomnia    Sequela of cardioembolic stroke 96/75/9163   Severe obesity with body mass index (BMI) of 36.0 to 36.9 with serious comorbidity ([place])    Squamous cell carcinoma 11/05/2019   Typical atrial flutter ([place]) 01/21/2015   Vitamin D deficiency, unspecified    Past Surgical History:  Procedure Laterality Date   APPENDECTOMY  1956   CARDIAC CATHETERIZATION     CARDIOVERSION  2002   CHOLECYSTECTOMY  2006   CORONARY ARTERY BYPASS GRAFT  2001   HERNIA REPAIR     RADIOLOGY WITH ANESTHESIA Bilateral 01/18/2018   Procedure: MRI WITH ANESTHESIA LUMBAR SPINE WITH AND WITHOUT CONTRAST;  Surgeon: Radiologist, Medication, MD;  Location: [facility];  Service: Radiology;  Laterality: Bilateral;   RADIOLOGY WITH ANESTHESIA N/A 04/19/2022   Procedure: MRI WITH BRAIN WITH AND WITHOUT CONTRAST;  Surgeon: Radiologist, Medication, MD;  Location: [facility];  Service: Radiology;  Laterality: N/A;   ROTATOR CUFF REPAIR Left    TONSILLECTOMY  1975    Family History  Problem Relation Age of Onset   Heart disease Father    Arrhythmia Father    Breast cancer Mother    Diabetes type II Sister    Diabetes Brother    Social History   Socioeconomic History   Marital status: Married    Spouse name: Not on file   Number of children: Not on file   Years of education: Not on file    Highest education level: Not on file  Occupational History   Occupation: Retired  Tobacco Use   Smoking status: Former    Packs/day: 1.00    Years: 15.00    Total pack years: 15.00    Types: Cigarettes    Quit date: 08/29/1978    Years since quitting: 43.7    Passive exposure: Past   Smokeless tobacco: Never  Vaping Use   Vaping Use: Never used  Substance and Sexual Activity   Alcohol use: No    Alcohol/week: 0.0 standard drinks of alcohol   Drug use: No   Sexual activity: Not Currently  Other Topics Concern   Not on file  Social History Narrative   Not on file   Social Determinants of Health   Financial Resource Strain: High Risk (  01/28/2022)   Overall Financial Resource Strain (CARDIA)    Difficulty of Paying Living Expenses: Hard  Food Insecurity: No Food Insecurity (12/07/2020)   Hunger Vital Sign    Worried About Running Out of Food in the Last Year: Never true    Ran Out of Food in the Last Year: Never true  Transportation Needs: No Transportation Needs (01/28/2022)   PRAPARE - Hydrologist (Medical): No    Lack of Transportation (Non-Medical): No  Physical Activity: Not on file  Stress: Not on file  Social Connections: Not on file    Review of Systems  Constitutional:  Negative for chills, fatigue, fever and unexpected weight change.  HENT:  Positive for congestion and ear pain (left ear). Negative for sinus pain and sore throat.   Respiratory:  Negative for cough and shortness of breath.   Cardiovascular:  Negative for chest pain and palpitations.  Gastrointestinal:  Negative for abdominal pain, blood in stool, constipation, diarrhea, nausea and vomiting.  Endocrine: Negative for polydipsia.  Genitourinary:  Negative for dysuria.  Musculoskeletal:  Negative for back pain.  Skin:  Negative for rash.  Neurological:  Negative for headaches.     Objective:  BP 110/60   Pulse 63   Temp 97.9 F (36.6 C)   Resp 15   Ht '5\' 10"'$  (1.778  m)   Wt 240 lb (108.9 kg)   SpO2 94%   BMI 34.44 kg/m      06/03/2022    9:18 AM 05/05/2022    1:45 PM 04/19/2022   10:00 AM  BP/Weight  Systolic BP 409 811 914  Diastolic BP 60 68 66  Wt. (Lbs) 240 237.4   BMI 34.44 kg/m2 34.06 kg/m2     Physical Exam Vitals reviewed.  Constitutional:      Appearance: Normal appearance. He is obese.  Neck:     Vascular: No carotid bruit.  Cardiovascular:     Rate and Rhythm: Normal rate and regular rhythm.     Pulses: Normal pulses.     Heart sounds: Normal heart sounds.  Pulmonary:     Effort: Pulmonary effort is normal.     Breath sounds: Normal breath sounds. No wheezing, rhonchi or rales.  Abdominal:     General: Bowel sounds are normal.     Palpations: Abdomen is soft.     Tenderness: There is no abdominal tenderness.  Musculoskeletal:     Comments: BL LE 5/5.   Neurological:     Mental Status: He is alert.  Psychiatric:        Mood and Affect: Mood normal.        Behavior: Behavior normal.     Diabetic Foot Exam - Simple   Simple Foot Form Diabetic Foot exam was performed with the following findings: Yes 06/03/2022 10:10 AM  Visual Inspection No deformities, no ulcerations, no other skin breakdown bilaterally: Yes Sensation Testing Intact to touch and monofilament testing bilaterally: Yes Pulse Check Posterior Tibialis and Dorsalis pulse intact bilaterally: Yes Comments      Lab Results  Component Value Date   WBC 5.7 06/03/2022   HGB 14.2 06/03/2022   HCT 40.7 06/03/2022   PLT 178 06/03/2022   GLUCOSE 90 06/03/2022   CHOL 144 06/03/2022   TRIG 84 06/03/2022   HDL 48 06/03/2022   LDLCALC 80 06/03/2022   ALT 20 06/03/2022   AST 22 06/03/2022   NA 143 06/03/2022   K 3.9 06/03/2022   CL  101 06/03/2022   CREATININE 1.36 (H) 06/03/2022   BUN 17 06/03/2022   CO2 26 06/03/2022   TSH 4.050 02/28/2022   INR 1.1 02/02/2022   HGBA1C 5.4 06/03/2022   MICROALBUR Positive 150 01/23/2019      Assessment & Plan:    Problem List Items Addressed This Visit       Cardiovascular and Mediastinum   Chronic diastolic heart failure ([place])    Management per specialist. The current medical regimen is effective;  continue present plan and medications.      Hypertensive heart disease with heart failure ([place])    Well controlled.  No changes to medicines. Amlodipine 5 mg daily, Valsartan 320 mg daily. Continue to work on eating a healthy diet and exercise.  Labs drawn today.       Relevant Orders   Comprehensive metabolic panel (Completed)   CBC with Differential/Platelet (Completed)   Persistent atrial fibrillation ([place])    Management per specialist. Continue with amiodarone 200 mg daily, Eliquis 5 mg twice a day.         Respiratory   Chronic obstructive pulmonary disease (COPD) ([place])    Currently on no medicines.        Endocrine   Acquired hypothyroidism    Previously well controlled Continue Synthroid at current dose  Recheck TSH and adjust Synthroid as indicated       Impaired fasting glucose    Hemoglobin A1c 5.4%, 3 month avg of blood sugars, is in prediabetic range.  In order to prevent progression to diabetes, recommend low carb diet and regular exercise      Relevant Orders   Hemoglobin A1c (Completed)     Other   Vitamin D deficiency, unspecified    Check lab      Relevant Orders   VITAMIN D 25 Hydroxy (Vit-D Deficiency, Fractures) (Completed)   Dyslipidemia - Primary    Well controlled.  No changes to medicines. Atorvastatin 20 mg daily. Continue to work on eating a healthy diet and exercise.  Labs drawn today.       Relevant Orders   Lipid panel (Completed)   Benign prostatic hyperplasia with urinary frequency    The current medical regimen is effective;  continue present plan and medications. Currently taking Tamsulosin 0.4 mg daily, Finasteride 40 mg daily.      B12 deficiency    Check lab      Relevant Orders   Vitamin B12 (Completed)   Muscle  weakness (generalized)    Referral to Physical Therapy was ordered.      Relevant Orders   Ambulatory referral to Physical Therapy   Other Visit Diagnoses     Need for immunization against influenza       Relevant Orders   Flu Vaccine QUAD High Dose(Fluad) (Completed)     .  No orders of the defined types were placed in this encounter.   Orders Placed This Encounter  Procedures   Flu Vaccine QUAD High Dose(Fluad)   Comprehensive metabolic panel   Hemoglobin A1c   Lipid panel   CBC with Differential/Platelet   Vitamin B12   VITAMIN D 25 Hydroxy (Vit-D Deficiency, Fractures)   Cardiovascular Risk Assessment   Ambulatory referral to Physical Therapy    Follow-up: Return in about 3 months (around 09/03/2022) for chronic fasting.  An After Visit Summary was printed and given to the patient.  [name], MD [facility] [phone]

## 2022-06-04 LAB — COMPREHENSIVE METABOLIC PANEL
ALT: 20 IU/L (ref 0–44)
AST: 22 IU/L (ref 0–40)
Albumin/Globulin Ratio: 1.9 (ref 1.2–2.2)
Albumin: 4.6 g/dL (ref 3.8–4.8)
Alkaline Phosphatase: 91 IU/L (ref 44–121)
BUN/Creatinine Ratio: 13 (ref 10–24)
BUN: 17 mg/dL (ref 8–27)
Bilirubin Total: 1 mg/dL (ref 0.0–1.2)
CO2: 26 mmol/L (ref 20–29)
Calcium: 9.4 mg/dL (ref 8.6–10.2)
Chloride: 101 mmol/L (ref 96–106)
Creatinine, Ser: 1.36 mg/dL — ABNORMAL HIGH (ref 0.76–1.27)
Globulin, Total: 2.4 g/dL (ref 1.5–4.5)
Glucose: 90 mg/dL (ref 70–99)
Potassium: 3.9 mmol/L (ref 3.5–5.2)
Sodium: 143 mmol/L (ref 134–144)
Total Protein: 7 g/dL (ref 6.0–8.5)
eGFR: 55 mL/min/{1.73_m2} — ABNORMAL LOW (ref >59)

## 2022-06-04 LAB — LIPID PANEL
Chol/HDL Ratio: 3 ratio (ref 0.0–5.0)
Cholesterol, Total: 144 mg/dL (ref 100–199)
HDL: 48 mg/dL (ref >39)
LDL Chol Calc (NIH): 80 mg/dL (ref 0–99)
Triglycerides: 84 mg/dL (ref 0–149)
VLDL Cholesterol Cal: 16 mg/dL (ref 5–40)

## 2022-06-04 LAB — CBC WITH DIFFERENTIAL/PLATELET
Basophils Absolute: 0.1 10*3/uL (ref 0.0–0.2)
Basos: 1 %
EOS (ABSOLUTE): 0.3 10*3/uL (ref 0.0–0.4)
Eos: 4 %
Hematocrit: 40.7 % (ref 37.5–51.0)
Hemoglobin: 14.2 g/dL (ref 13.0–17.7)
Immature Grans (Abs): 0 10*3/uL (ref 0.0–0.1)
Immature Granulocytes: 1 %
Lymphocytes Absolute: 1.5 10*3/uL (ref 0.7–3.1)
Lymphs: 26 %
MCH: 31.8 pg (ref 26.6–33.0)
MCHC: 34.9 g/dL (ref 31.5–35.7)
MCV: 91 fL (ref 79–97)
Monocytes Absolute: 0.7 10*3/uL (ref 0.1–0.9)
Monocytes: 12 %
Neutrophils Absolute: 3.2 10*3/uL (ref 1.4–7.0)
Neutrophils: 56 %
Platelets: 178 10*3/uL (ref 150–450)
RBC: 4.47 x10E6/uL (ref 4.14–5.80)
RDW: 11.9 % (ref 11.6–15.4)
WBC: 5.7 10*3/uL (ref 3.4–10.8)

## 2022-06-04 LAB — HEMOGLOBIN A1C
Est. average glucose Bld gHb Est-mCnc: 108 mg/dL
Hgb A1c MFr Bld: 5.4 % (ref 4.8–5.6)

## 2022-06-04 LAB — VITAMIN B12: Vitamin B-12: 237 pg/mL (ref 232–1245)

## 2022-06-04 LAB — VITAMIN D 25 HYDROXY (VIT D DEFICIENCY, FRACTURES): Vit D, 25-Hydroxy: 44.8 ng/mL (ref 30.0–100.0)

## 2022-06-04 LAB — CARDIOVASCULAR RISK ASSESSMENT

## 2022-06-05 NOTE — Assessment & Plan Note (Signed)
Well controlled.  No changes to medicines. Atorvastatin 20 mg daily. Continue to work on eating a healthy diet and exercise.  Labs drawn today.   

## 2022-06-05 NOTE — Assessment & Plan Note (Signed)
Management per specialist. Continue with amiodarone 200 mg daily, Eliquis 5 mg twice a day.

## 2022-06-05 NOTE — Assessment & Plan Note (Signed)
Well controlled.  No changes to medicines. Amlodipine 5 mg daily, Valsartan 320 mg daily. Continue to work on eating a healthy diet and exercise.  Labs drawn today.   

## 2022-06-05 NOTE — Assessment & Plan Note (Signed)
The current medical regimen is effective;  continue present plan and medications. Currently taking Tamsulosin 0.4 mg daily, Finasteride 40 mg daily.

## 2022-06-05 NOTE — Assessment & Plan Note (Signed)
Referral to Physical Therapy was ordered.

## 2022-06-05 NOTE — Assessment & Plan Note (Signed)
Hemoglobin A1c 5.4%, 3 month avg of blood sugars, is in prediabetic range.  In order to prevent progression to diabetes, recommend low carb diet and regular exercise  

## 2022-06-05 NOTE — Assessment & Plan Note (Addendum)
Currently on no medicines. 

## 2022-06-05 NOTE — Assessment & Plan Note (Signed)
>>  ASSESSMENT AND PLAN FOR CHRONIC DIASTOLIC HEART FAILURE ([place]) WRITTEN ON 06/05/2022 11:13 AM BY LEAL-BORJAS, Tru Leopard I, CMA  Management per specialist. The current medical regimen is effective;  continue present plan and medications.

## 2022-06-05 NOTE — Assessment & Plan Note (Signed)
Check lab

## 2022-06-05 NOTE — Assessment & Plan Note (Signed)
Management per specialist The current medical regimen is effective;  continue present plan and medications.  

## 2022-06-05 NOTE — Assessment & Plan Note (Signed)
Previously well controlled Continue Synthroid at current dose  Recheck TSH and adjust Synthroid as indicated   

## 2022-06-05 NOTE — Assessment & Plan Note (Signed)
>>  ASSESSMENT AND PLAN FOR DYSLIPIDEMIA WRITTEN ON 06/05/2022 11:09 AM BY LEAL-BORJAS, Julee Stoll I, CMA  Well controlled.  No changes to medicines. Atorvastatin 20 mg daily. Continue to work on eating a healthy diet and exercise.  Labs drawn today.

## 2022-06-12 ENCOUNTER — Other Ambulatory Visit: Payer: Self-pay | Admitting: Family Medicine

## 2022-06-12 DIAGNOSIS — N401 Enlarged prostate with lower urinary tract symptoms: Secondary | ICD-10-CM

## 2022-06-20 ENCOUNTER — Ambulatory Visit (INDEPENDENT_AMBULATORY_CARE_PROVIDER_SITE_OTHER): Payer: Medicare Other

## 2022-06-20 DIAGNOSIS — N289 Disorder of kidney and ureter, unspecified: Secondary | ICD-10-CM

## 2022-06-21 LAB — COMPREHENSIVE METABOLIC PANEL
ALT: 21 IU/L (ref 0–44)
AST: 23 IU/L (ref 0–40)
Albumin/Globulin Ratio: 1.8 (ref 1.2–2.2)
Albumin: 4.4 g/dL (ref 3.8–4.8)
Alkaline Phosphatase: 92 IU/L (ref 44–121)
BUN/Creatinine Ratio: 14 (ref 10–24)
BUN: 18 mg/dL (ref 8–27)
Bilirubin Total: 0.6 mg/dL (ref 0.0–1.2)
CO2: 21 mmol/L (ref 20–29)
Calcium: 9 mg/dL (ref 8.6–10.2)
Chloride: 101 mmol/L (ref 96–106)
Creatinine, Ser: 1.25 mg/dL (ref 0.76–1.27)
Globulin, Total: 2.4 g/dL (ref 1.5–4.5)
Glucose: 90 mg/dL (ref 70–99)
Potassium: 3.9 mmol/L (ref 3.5–5.2)
Sodium: 141 mmol/L (ref 134–144)
Total Protein: 6.8 g/dL (ref 6.0–8.5)
eGFR: 60 mL/min/{1.73_m2} (ref >59)

## 2022-06-21 NOTE — Progress Notes (Signed)
Patient here for recheck CMP.

## 2022-06-22 DIAGNOSIS — R29898 Other symptoms and signs involving the musculoskeletal system: Secondary | ICD-10-CM | POA: Diagnosis not present

## 2022-06-30 ENCOUNTER — Telehealth: Payer: Self-pay

## 2022-06-30 NOTE — Progress Notes (Signed)
Chronic Care Management Pharmacy Assistant   Name: John Kemp  MRN: [id number] DOB: 23-Aug-1946  Reason for Encounter: Disease State/ Hypertension  Recent office visits:  06-20-2022 Burna Forts, CMA. CMP recheck.  06-03-2022 [name], MD. Creatinine= 1.36, eGFR= 55. Referral placed to physical therapy. COMPLETED omnicef. Recent consult visits:  05-26-2022 Hessie Knows, MD (ENT). Initial consult for tinnitus of right ear.  05-25-2022 Claudie Leach, MD (Audiology). Visit for hearing loss.  Hospital visits:  Medication Reconciliation was completed by comparing discharge summary, patient's EMR and Pharmacy list, and upon discussion with patient.   Admitted to the hospital on 02-02-2022 due to Near syncope.[facility] Discharge date was 02-02-2022. Discharged from [facility]?Medications Started at [facility] Discharge:?? None   Medication Changes at Hospital Discharge: None   Medications Discontinued at Hospital Discharge: None   Medications that remain the same after Hospital Discharge:??  -All other medications will remain the same.      Medications: Outpatient Encounter Medications as of 06/30/2022  Medication Sig   acetaminophen (TYLENOL) 500 MG tablet Take 500 mg by mouth 2 (two) times daily.   amiodarone (PACERONE) 200 MG tablet Take 1 tablet (200 mg total) by mouth daily.   amLODipine (NORVASC) 5 MG tablet TAKE 1 TABLET (5 MG TOTAL) BY MOUTH DAILY.   atorvastatin (LIPITOR) 20 MG tablet TAKE 1 TABLET BY MOUTH EVERY DAY   Azelastine HCl 137 MCG/SPRAY SOLN PLACE 2 SPRAYS INTO BOTH NOSTRILS 2 (TWO) TIMES DAILY. USE IN EACH NOSTRIL AS DIRECTED   ELIQUIS 5 MG TABS tablet TAKE 1 TABLET BY MOUTH TWICE A DAY   finasteride (PROSCAR) 5 MG tablet Take 1 tablet (5 mg total) by mouth daily.   fluticasone (FLONASE) 50 MCG/ACT nasal spray SPRAY 2 SPRAYS INTO EACH NOSTRIL EVERY DAY   furosemide (LASIX) 40 MG tablet Take 1 tablet (40 mg total) by mouth  daily as needed for fluid or edema.   Inulin (FIBER CHOICE PO) Take 3 tablets by mouth daily as needed (Constipation).   levothyroxine (SYNTHROID) 100 MCG tablet Take 1 tablet (100 mcg total) by mouth daily.   nitroGLYCERIN (NITROSTAT) 0.4 MG SL tablet Place 0.4 mg under the tongue every 5 (five) minutes as needed for chest pain.   potassium chloride (KLOR-CON M) 10 MEQ tablet Take 10 mEq by mouth daily as needed (take with furosemide).   tamsulosin (FLOMAX) 0.4 MG CAPS capsule TAKE 1 CAPSULE BY MOUTH EVERY DAY   valsartan (DIOVAN) 320 MG tablet Take 1 tablet (320 mg total) by mouth daily.   Vitamin D, Ergocalciferol, (DRISDOL) 1.25 MG (50000 UNIT) CAPS capsule Take 1 capsule (50,000 Units total) by mouth every 7 (seven) days.   zolpidem (AMBIEN) 10 MG tablet Take 1 tablet (10 mg total) by mouth at bedtime.   No facility-administered encounter medications on file as of 06/30/2022.   Recent Office Vitals: BP Readings from Last 3 Encounters:  06/03/22 110/60  05/05/22 132/68  04/19/22 132/66   Pulse Readings from Last 3 Encounters:  06/03/22 63  05/05/22 (!) 58  04/19/22 (!) 58    Wt Readings from Last 3 Encounters:  06/03/22 240 lb (108.9 kg)  05/05/22 237 lb 6.4 oz (107.7 kg)  04/19/22 230 lb (104.3 kg)     Kidney Function Lab Results  Component Value Date/Time   CREATININE 1.25 06/20/2022 08:02 AM   CREATININE 1.36 (H) 06/03/2022 10:25 AM   GFRNONAA >60 04/19/2022 07:21 AM   GFRAA  54 (L) 09/21/2020 10:05 AM       Latest Ref Rng & Units 06/20/2022    8:02 AM 06/03/2022   10:25 AM 04/19/2022    7:21 AM  BMP  Glucose 70 - 99 mg/dL 90  90  90   BUN 8 - 27 mg/dL _0 Creatinine 0.76 - 1.27 mg/dL 1.25  1.36  1.21   BUN/Creat Ratio 10 - _1 Sodium 134 - 144 mmol/L 141  143  140   Potassium 3.5 - 5.2 mmol/L 3.9  3.9  3.7   Chloride 96 - 106 mmol/L 101  101  105   CO2 20 - 29 mmol/L _2 Calcium 8.6 - 10.2 mg/dL 9.0  9.4  9.0      Current  antihypertensive regimen:  Amlodipine 5 mg bid Furosemide 40 mg Prn Valsartan 320 mg daily Potassium 10 meq daily prn   Patient verbally confirms he is taking the above medications as directed. Yes  How often are you checking your Blood Pressure? daily  he checks his blood pressure in the morning after taking his medication.  Current home BP readings: 11-02 117/77, 11-03 125/64, 11-04 123/68, 11-05 116/64 Wrist or arm cuff: Cuff Caffeine intake: 1 12 oz diet pepsi weekly  Salt intake: Limited OTC medications including pseudoephedrine or NSAIDs? None  Any readings above 180/120? No  What recent interventions/DTPs have been made by any provider to improve Blood Pressure control since last CPP Visit:  Educated on BP goals and benefits of medications for prevention of heart attack, stroke and kidney damage; Daily salt intake goal < 2300 mg; Exercise goal of 150 minutes per week; Importance of home blood pressure monitoring; -Counseled to monitor BP at home weekly, document, and provide log at future appointments -Counseled on diet and exercise extensively Recommended to continue current medication Recommended beginning to check bp weekly at home   Any recent hospitalizations or ED visits since last visit with CPP? Yes  What diet changes have been made to improve Blood Pressure Control?  Patient stated he doesn't add salt to food and drinks plenty of water daily.   What exercise is being done to improve your Blood Pressure Control?  Patient states he moves around daily but doesn't exercise.    Adherence Review: Is the patient currently on ACE/ARB medication? Yes Does the patient have >5 day gap between last estimated fill dates? CPP to review  Care Gaps: Last annual wellness visit? 08-26-2021   Hep C screening overdue Shingrix overdue 3rd Moderna vaccine overdue last completed 12-18-2020  Star Rating Drugs: Atorvastatin 10 mg- Last filled 05-29-2022 90 DS. Previous  02-28-2022 90 DS  Valsartan 320 mg- Last filled 06-04-2022 90 DS. Previous 03-04-2022 90 DS   [place] Clinical Pharmacist Assistant [phone]

## 2022-07-05 ENCOUNTER — Encounter: Payer: Self-pay | Admitting: Family Medicine

## 2022-07-05 ENCOUNTER — Ambulatory Visit (INDEPENDENT_AMBULATORY_CARE_PROVIDER_SITE_OTHER): Payer: Medicare Other | Admitting: Family Medicine

## 2022-07-05 VITALS — BP 122/74 | HR 56 | Temp 97.9°F | Ht 70.0 in | Wt 244.0 lb

## 2022-07-05 DIAGNOSIS — R600 Localized edema: Secondary | ICD-10-CM | POA: Insufficient documentation

## 2022-07-05 DIAGNOSIS — I5032 Chronic diastolic (congestive) heart failure: Secondary | ICD-10-CM

## 2022-07-05 HISTORY — DX: Localized edema: R60.0

## 2022-07-05 MED ORDER — TORSEMIDE 20 MG PO TABS: 20.0000 mg | ORAL_TABLET | Freq: Every day | ORAL | 1 refills | Status: DC

## 2022-07-05 NOTE — Assessment & Plan Note (Signed)
Instructed patient to call back to cardiopulmonary rehab and start program.

## 2022-07-05 NOTE — Progress Notes (Signed)
Acute Office Visit  Subjective:    Patient ID: John Kemp, male    DOB: 1946-05-01, 76 y.o.   MRN: [id number]  Chief Complaint  Patient presents with   pedal edema   HPI Patient is in today for pedal edema x 2 weeks. No medication changes. Right > left leg swelling. No pain.  Denies dyspnea on exertion.  Denies orthopnea or PND.  Patient does report that he sleeps in a lazy boy chair most of the time.  Denies increased sodium in his diet.  Patient does have a history of diastolic stage II congestive heart failure.  Patient has been taking Lasix 40 mg 1-2 times per day.  He is not elevating his legs.   Nerve conduction study was normal.  We called and got the report verbally.  Patient had held off on cardiopulmonary rehab as he had been scheduled to have the study within 2 days.  Past Medical History:  Diagnosis Date   A-fib ([place])    Acquired thrombophilia ([place]) 11/17/2019   Acute bronchitis due to other specified organisms 10/28/2021   Acute cough 10/20/2021   Acute non-recurrent maxillary sinusitis 10/28/2021   Anticoagulated 01/21/2015   Atherosclerotic heart disease of native coronary artery without angina pectoris    Atrial fibrillation with rapid ventricular response ([place]) 11/12/2021   Atrial flutter with rapid ventricular response ([place]) 11/12/2021   CAD in native artery 01/21/2015   Cancer ([place])    skin cancer   CHF (congestive heart failure) ([place]) 11/05/2019   Chronic diastolic (congestive) heart failure (HCC)    Chronic diastolic heart failure ([place]) 01/21/2015   Last Assessment & Plan:  Is congestive heart failure is mildly decompensated he is edematous he'll increase the dose of his diuretic today the lab work performed including an ARB referred to care transitions for home care he'll follow-up in my office with me in 4-6 weeks daily weights sodium restrictionand compliance of medications beinghemoglobin of will   Chronic obstructive pulmonary disease (COPD) ([place])     Coronary artery disease involving autologous artery coronary bypass graft 11/12/2021   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 11/17/2019   Dizziness 11/12/2021   Dyslipidemia 11/12/2021   Elevated brain natriuretic peptide (BNP) level 11/12/2021   Essential hypertension 01/21/2015   right superior quadrantanopsia   Hearing loss of right ear due to cerumen impaction 10/20/2021   High cholesterol 10/20/2017   Hx of CABG 03/02/2017   Hypertension    Hypertensive heart disease with heart failure ([place])    Hypothyroidism (acquired) 04/24/2018   Impaired fasting glucose    Left rotator cuff tear 11/12/2021   Mixed hyperlipidemia 11/17/2019   Myocardial infarction ([place])    Non-seasonal allergic rhinitis due to pollen 10/20/2021   On amiodarone therapy 01/21/2015   PAF (paroxysmal atrial fibrillation) (HCC)    Primary insomnia    Sequela of cardioembolic stroke 04/11/4817   Severe obesity with body mass index (BMI) of 36.0 to 36.9 with serious comorbidity ([place])    Squamous cell carcinoma 11/05/2019   Typical atrial flutter ([place]) 01/21/2015   Vitamin D deficiency, unspecified     Past Surgical History:  Procedure Laterality Date   APPENDECTOMY  1956   CARDIAC CATHETERIZATION     CARDIOVERSION  2002   CHOLECYSTECTOMY  2006   CORONARY ARTERY BYPASS GRAFT  2001   HERNIA REPAIR     RADIOLOGY WITH ANESTHESIA Bilateral 01/18/2018   Procedure: MRI WITH ANESTHESIA LUMBAR SPINE WITH AND WITHOUT CONTRAST;  Surgeon: Radiologist, Medication, MD;  Location: [facility];  Service: Radiology;  Laterality: Bilateral;   RADIOLOGY WITH ANESTHESIA N/A 04/19/2022   Procedure: MRI WITH BRAIN WITH AND WITHOUT CONTRAST;  Surgeon: Radiologist, Medication, MD;  Location: [facility];  Service: Radiology;  Laterality: N/A;   ROTATOR CUFF REPAIR Left    TONSILLECTOMY  1975    Family History  Problem Relation Age of Onset   Heart disease Father    Arrhythmia Father    Breast cancer Mother    Diabetes  type II Sister    Diabetes Brother     Social History   Socioeconomic History   Marital status: Married    Spouse name: Not on file   Number of children: Not on file   Years of education: Not on file   Highest education level: Not on file  Occupational History   Occupation: Retired  Tobacco Use   Smoking status: Former    Packs/day: 1.00    Years: 15.00    Total pack years: 15.00    Types: Cigarettes    Quit date: 08/29/1978    Years since quitting: 43.8    Passive exposure: Past   Smokeless tobacco: Never  Vaping Use   Vaping Use: Never used  Substance and Sexual Activity   Alcohol use: No    Alcohol/week: 0.0 standard drinks of alcohol   Drug use: No   Sexual activity: Not Currently  Other Topics Concern   Not on file  Social History Narrative   Not on file   Social Determinants of Health   Financial Resource Strain: High Risk (01/28/2022)   Overall Financial Resource Strain (CARDIA)    Difficulty of Paying Living Expenses: Hard  Food Insecurity: No Food Insecurity (12/07/2020)   Hunger Vital Sign    Worried About Running Out of Food in the Last Year: Never true    Ran Out of Food in the Last Year: Never true  Transportation Needs: No Transportation Needs (01/28/2022)   PRAPARE - Hydrologist (Medical): No    Lack of Transportation (Non-Medical): No  Physical Activity: Not on file  Stress: Not on file  Social Connections: Not on file  Intimate Partner Violence: Not on file    Outpatient Medications Prior to Visit  Medication Sig Dispense Refill   acetaminophen (TYLENOL) 500 MG tablet Take 500 mg by mouth 2 (two) times daily.     amiodarone (PACERONE) 200 MG tablet Take 1 tablet (200 mg total) by mouth daily. 90 tablet 2   amLODipine (NORVASC) 5 MG tablet TAKE 1 TABLET (5 MG TOTAL) BY MOUTH DAILY. 90 tablet 1   atorvastatin (LIPITOR) 20 MG tablet TAKE 1 TABLET BY MOUTH EVERY DAY 90 tablet 0   Azelastine HCl 137 MCG/SPRAY SOLN PLACE 2  SPRAYS INTO BOTH NOSTRILS 2 (TWO) TIMES DAILY. USE IN EACH NOSTRIL AS DIRECTED 90 mL 1   ELIQUIS 5 MG TABS tablet TAKE 1 TABLET BY MOUTH TWICE A DAY 180 tablet 1   finasteride (PROSCAR) 5 MG tablet Take 1 tablet (5 mg total) by mouth daily. 90 tablet 1   fluticasone (FLONASE) 50 MCG/ACT nasal spray SPRAY 2 SPRAYS INTO EACH NOSTRIL EVERY DAY 48 mL 2   Inulin (FIBER CHOICE PO) Take 3 tablets by mouth daily as needed (Constipation).     levothyroxine (SYNTHROID) 100 MCG tablet Take 1 tablet (100 mcg total) by mouth daily. 90 tablet 1   nitroGLYCERIN (NITROSTAT) 0.4 MG SL tablet Place 0.4  mg under the tongue every 5 (five) minutes as needed for chest pain.     potassium chloride (KLOR-CON M) 10 MEQ tablet Take 10 mEq by mouth daily as needed (take with furosemide).     tamsulosin (FLOMAX) 0.4 MG CAPS capsule TAKE 1 CAPSULE BY MOUTH EVERY DAY 90 capsule 1   valsartan (DIOVAN) 320 MG tablet Take 1 tablet (320 mg total) by mouth daily. 90 tablet 1   Vitamin D, Ergocalciferol, (DRISDOL) 1.25 MG (50000 UNIT) CAPS capsule Take 1 capsule (50,000 Units total) by mouth every 7 (seven) days. 5 capsule 2   zolpidem (AMBIEN) 10 MG tablet Take 1 tablet (10 mg total) by mouth at bedtime. 30 tablet 3   furosemide (LASIX) 40 MG tablet Take 1 tablet (40 mg total) by mouth daily as needed for fluid or edema. 24 tablet 2   No facility-administered medications prior to visit.    Allergies  Allergen Reactions   Avelox [Moxifloxacin Hcl] Other (See Comments)    Unknown   Cartia Xt [Diltiazem Hcl] Nausea And Vomiting   Pravachol [Pravastatin Sodium] Other (See Comments)    myalgia   Prednisone Other (See Comments)    "makes me feel terrible"   Zetia [Ezetimibe] Other (See Comments)    myalgias    Review of Systems  Constitutional:  Negative for appetite change, fatigue and fever.  HENT:  Negative for congestion, ear pain, sinus pressure and sore throat.   Respiratory:  Negative for cough, chest tightness,  shortness of breath and wheezing.   Cardiovascular:  Positive for leg swelling (Bilateral Right worse than the left). Negative for chest pain and palpitations.  Gastrointestinal:  Negative for abdominal pain, constipation, diarrhea, nausea and vomiting.  Genitourinary:  Negative for dysuria and hematuria.  Musculoskeletal:  Negative for arthralgias, back pain, joint swelling and myalgias.  Skin:  Negative for rash.  Neurological:  Negative for dizziness, weakness and headaches.  Psychiatric/Behavioral:  Negative for dysphoric mood. The patient is not nervous/anxious.        Objective:    Physical Exam Vitals reviewed.  Constitutional:      Appearance: Normal appearance. He is obese.  Cardiovascular:     Rate and Rhythm: Normal rate and regular rhythm.     Pulses: Normal pulses.     Heart sounds: Normal heart sounds.  Pulmonary:     Effort: Pulmonary effort is normal.     Breath sounds: Normal breath sounds.  Musculoskeletal:     Right lower leg: Edema present.     Left lower leg: Edema present.  Neurological:     Mental Status: He is alert and oriented to person, place, and time.  Psychiatric:        Mood and Affect: Mood normal.        Behavior: Behavior normal.     BP 122/74 (BP Location: Left Arm, Patient Position: Sitting)   Pulse (!) 56   Temp 97.9 F (36.6 C) (Temporal)   Ht _0  (1.778 m)   Wt 244 lb (110.7 kg)   SpO2 99%   BMI 35.01 kg/m  Wt Readings from Last 3 Encounters:  07/05/22 244 lb (110.7 kg)  06/03/22 240 lb (108.9 kg)  05/05/22 237 lb 6.4 oz (107.7 kg)    Health Maintenance Due  Topic Date Due   Hepatitis C Screening  Never done   Zoster Vaccines- Shingrix (1 of 2) Never done   COVID-19 Vaccine (3 - Moderna risk series) 01/15/2021   Medicare Annual Wellness (  AWV)  08/26/2022    There are no preventive care reminders to display for this patient.   Lab Results  Component Value Date   TSH 4.050 02/28/2022   Lab Results  Component  Value Date   WBC 5.7 06/03/2022   HGB 14.2 06/03/2022   HCT 40.7 06/03/2022   MCV 91 06/03/2022   PLT 178 06/03/2022   Lab Results  Component Value Date   NA 141 06/20/2022   K 3.9 06/20/2022   CO2 21 06/20/2022   GLUCOSE 90 06/20/2022   BUN 18 06/20/2022   CREATININE 1.25 06/20/2022   BILITOT 0.6 06/20/2022   ALKPHOS 92 06/20/2022   AST 23 06/20/2022   ALT 21 06/20/2022   PROT 6.8 06/20/2022   ALBUMIN 4.4 06/20/2022   CALCIUM 9.0 06/20/2022   ANIONGAP 10 04/19/2022   EGFR 60 06/20/2022   Lab Results  Component Value Date   CHOL 144 06/03/2022   Lab Results  Component Value Date   HDL 48 06/03/2022   Lab Results  Component Value Date   LDLCALC 80 06/03/2022   Lab Results  Component Value Date   TRIG 84 06/03/2022   Lab Results  Component Value Date   CHOLHDL 3.0 06/03/2022   Lab Results  Component Value Date   HGBA1C 5.4 06/03/2022         Assessment & Plan:   Problem List Items Addressed This Visit       Cardiovascular and Mediastinum   Chronic diastolic (congestive) heart failure ([place])    Instructed patient to call back to cardiopulmonary rehab and start program.      Relevant Medications   torsemide (DEMADEX) 20 MG tablet     Other   Pedal edema - Primary    Discontinue Lasix.  Started on torsemide 20 mg daily. Recommend compression stockings. Recommend elevate legs.      Relevant Orders   Comprehensive metabolic panel   Pro b natriuretic peptide (BNP)     Meds ordered this encounter  Medications   torsemide (DEMADEX) 20 MG tablet    Sig: Take 1 tablet (20 mg total) by mouth daily.    Dispense:  30 tablet    Refill:  1   Follow up in 6 weeks.   I,Lauren M Auman,acting as a scribe for [name], MD.,have documented all relevant documentation on the behalf of [name], MD,as directed by  [name], MD while in the presence of [name], MD.   [name], MD

## 2022-07-05 NOTE — Assessment & Plan Note (Signed)
Discontinue Lasix.  Started on torsemide 20 mg daily. Recommend compression stockings. Recommend elevate legs.

## 2022-07-05 NOTE — Patient Instructions (Addendum)
Stop lasix.  Start torsemide 20 mg daily.  Recommend compression stockings.  Recommend low sodium diet.  Call cardiopulmonary rehab.

## 2022-07-06 LAB — COMPREHENSIVE METABOLIC PANEL
ALT: 22 IU/L (ref 0–44)
AST: 22 IU/L (ref 0–40)
Albumin/Globulin Ratio: 2.4 — ABNORMAL HIGH (ref 1.2–2.2)
Albumin: 4.6 g/dL (ref 3.8–4.8)
Alkaline Phosphatase: 88 IU/L (ref 44–121)
BUN/Creatinine Ratio: 15 (ref 10–24)
BUN: 19 mg/dL (ref 8–27)
Bilirubin Total: 0.7 mg/dL (ref 0.0–1.2)
CO2: 26 mmol/L (ref 20–29)
Calcium: 8.9 mg/dL (ref 8.6–10.2)
Chloride: 101 mmol/L (ref 96–106)
Creatinine, Ser: 1.28 mg/dL — ABNORMAL HIGH (ref 0.76–1.27)
Globulin, Total: 1.9 g/dL (ref 1.5–4.5)
Glucose: 87 mg/dL (ref 70–99)
Potassium: 3.9 mmol/L (ref 3.5–5.2)
Sodium: 140 mmol/L (ref 134–144)
Total Protein: 6.5 g/dL (ref 6.0–8.5)
eGFR: 59 mL/min/{1.73_m2} — ABNORMAL LOW (ref >59)

## 2022-07-06 LAB — PRO B NATRIURETIC PEPTIDE: NT-Pro BNP: 140 pg/mL (ref 0–376)

## 2022-07-06 NOTE — Progress Notes (Signed)
Liver function normal.  Kidney function slightly abnormal.   Probnp normal.

## 2022-07-07 DIAGNOSIS — L219 Seborrheic dermatitis, unspecified: Secondary | ICD-10-CM | POA: Diagnosis not present

## 2022-07-07 DIAGNOSIS — L570 Actinic keratosis: Secondary | ICD-10-CM | POA: Diagnosis not present

## 2022-07-07 DIAGNOSIS — D0422 Carcinoma in situ of skin of left ear and external auricular canal: Secondary | ICD-10-CM | POA: Diagnosis not present

## 2022-07-07 DIAGNOSIS — L578 Other skin changes due to chronic exposure to nonionizing radiation: Secondary | ICD-10-CM | POA: Diagnosis not present

## 2022-07-13 ENCOUNTER — Other Ambulatory Visit: Payer: Self-pay | Admitting: Family Medicine

## 2022-07-27 ENCOUNTER — Other Ambulatory Visit: Payer: Self-pay | Admitting: Family Medicine

## 2022-08-04 ENCOUNTER — Telehealth: Payer: Self-pay

## 2022-08-04 NOTE — Chronic Care Management (AMB) (Signed)
Chronic Care Management Pharmacy Assistant   Name: John Kemp  MRN: [id number] DOB: [birth year]  Reason for Encounter: Disease State/ Hypertension  Recent office visits:  07-05-2022 [name], MD. Creatinine= 1.28, eGFR= 59, Albumin/Globulin Ratio= 2.4. STOP lasix. START torsemide 20 mg daily.  Recent consult visits:  07-18-2022 [facility] Carrier, AuD CCC-A (Audiology). Visit to discuss treatment options for binaural sensorineural hearing loss (SNHL) that is worse in the right ear.   Hospital visits:  None in previous 6 months  Medications: Outpatient Encounter Medications as of 08/04/2022  Medication Sig   acetaminophen (TYLENOL) 500 MG tablet Take 500 mg by mouth 2 (two) times daily.   amiodarone (PACERONE) 200 MG tablet Take 1 tablet (200 mg total) by mouth daily.   amLODipine (NORVASC) 5 MG tablet TAKE 1 TABLET (5 MG TOTAL) BY MOUTH DAILY.   atorvastatin (LIPITOR) 20 MG tablet TAKE 1 TABLET BY MOUTH EVERY DAY   Azelastine HCl 137 MCG/SPRAY SOLN PLACE 2 SPRAYS INTO BOTH NOSTRILS 2 (TWO) TIMES DAILY. USE IN EACH NOSTRIL AS DIRECTED   ELIQUIS 5 MG TABS tablet TAKE 1 TABLET BY MOUTH TWICE A DAY   finasteride (PROSCAR) 5 MG tablet Take 1 tablet (5 mg total) by mouth daily.   fluticasone (FLONASE) 50 MCG/ACT nasal spray SPRAY 2 SPRAYS INTO EACH NOSTRIL EVERY DAY   Inulin (FIBER CHOICE PO) Take 3 tablets by mouth daily as needed (Constipation).   levothyroxine (SYNTHROID) 100 MCG tablet Take 1 tablet (100 mcg total) by mouth daily.   nitroGLYCERIN (NITROSTAT) 0.4 MG SL tablet Place 0.4 mg under the tongue every 5 (five) minutes as needed for chest pain.   potassium chloride (KLOR-CON M) 10 MEQ tablet Take 10 mEq by mouth daily as needed (take with furosemide).   tamsulosin (FLOMAX) 0.4 MG CAPS capsule TAKE 1 CAPSULE BY MOUTH EVERY DAY   torsemide (DEMADEX) 20 MG tablet TAKE 1 TABLET BY MOUTH EVERY DAY   valsartan (DIOVAN) 320 MG tablet Take 1 tablet (320 mg total) by  mouth daily.   Vitamin D, Ergocalciferol, (DRISDOL) 1.25 MG (50000 UNIT) CAPS capsule Take 1 capsule (50,000 Units total) by mouth every 7 (seven) days.   zolpidem (AMBIEN) 10 MG tablet Take 1 tablet (10 mg total) by mouth at bedtime.   No facility-administered encounter medications on file as of 08/04/2022.   Recent Office Vitals: BP Readings from Last 3 Encounters:  07/05/22 122/74  06/03/22 110/60  05/05/22 132/68   Pulse Readings from Last 3 Encounters:  07/05/22 (!) 56  06/03/22 63  05/05/22 (!) 58    Wt Readings from Last 3 Encounters:  07/05/22 244 lb (110.7 kg)  06/03/22 240 lb (108.9 kg)  05/05/22 237 lb 6.4 oz (107.7 kg)     Kidney Function Lab Results  Component Value Date/Time   CREATININE 1.28 (H) 07/05/2022 04:19 PM   CREATININE 1.25 06/20/2022 08:02 AM   GFRNONAA >60 04/19/2022 07:21 AM   GFRAA 54 (L) 09/21/2020 10:05 AM       Latest Ref Rng & Units 07/05/2022    4:19 PM 06/20/2022    8:02 AM 06/03/2022   10:25 AM  BMP  Glucose 70 - 99 mg/dL 87  90  90   BUN 8 - 27 mg/dL _0 Creatinine 0.76 - 1.27 mg/dL 1.28  1.25  1.36   BUN/Creat Ratio 10 - _1 Sodium 134 - 144 mmol/L 140  141  143   Potassium 3.5 - 5.2 mmol/L 3.9  3.9  3.9   Chloride 96 - 106 mmol/L 101  101  101   CO2 20 - 29 mmol/L _0 Calcium 8.6 - 10.2 mg/dL 8.9  9.0  9.4      Current antihypertensive regimen:  Amlodipine 5 mg bid  Valsartan 320 mg daily  Torsemide 20 mg daily  Patient verbally confirms he is taking the above medications as directed. Yes  Do you receive your medications through PAP? No    How often are you checking your Blood Pressure? 1-2x per week  he checks his blood pressure in the morning after taking his medication.  Current home BP readings: 11-10 112/69, 11-14 118/67, 11-27 118/69, 12-03 119/60, 12-08 100/56 Wrist or arm cuff: Cuff Caffeine intake: 1 12 oz diet pepsi weekly   Salt intake: Limited OTC medications including  pseudoephedrine or NSAIDs? None  Any readings above 180/120? No  What recent interventions/DTPs have been made by any provider to improve Blood Pressure control since last CPP Visit:  Educated on BP goals and benefits of medications for prevention of heart attack, stroke and kidney damage; Daily salt intake goal < 2300 mg; Exercise goal of 150 minutes per week; Importance of home blood pressure monitoring; -Counseled to monitor BP at home weekly, document, and provide log at future appointments -Counseled on diet and exercise extensively Recommended to continue current medication Recommended beginning to check bp weekly at home   Any recent hospitalizations or ED visits since last visit with CPP? No  What diet changes have been made to improve Blood Pressure Control?  Patient stated he doesn't add salt to food and drinks plenty of water daily.   What exercise is being done to improve your Blood Pressure Control?  Patient states he moves around daily but doesn't exercise.     Adherence Review: Is the patient currently on ACE/ARB medication? Yes Does the patient have >5 day gap between last estimated fill dates? CPP to review  Care Gaps: Last annual wellness visit? 08-26-2021    Hep C screening overdue Shingrix overdue 3rd Moderna vaccine overdu  Star Rating Drugs: Atorvastatin 10 mg- Last filled 05-29-2022 90 DS. Previous 02-28-2022 90 DS  Valsartan 320 mg- Last filled 06-04-2022 90 DS. Previous 03-04-2022 90 DS    [place] Clinical Pharmacist Assistant [phone]

## 2022-08-18 ENCOUNTER — Other Ambulatory Visit: Payer: Self-pay | Admitting: Family Medicine

## 2022-08-18 ENCOUNTER — Ambulatory Visit (INDEPENDENT_AMBULATORY_CARE_PROVIDER_SITE_OTHER): Payer: Medicare Other | Admitting: Family Medicine

## 2022-08-18 ENCOUNTER — Encounter: Payer: Self-pay | Admitting: Family Medicine

## 2022-08-18 VITALS — BP 100/70 | HR 68 | Temp 97.7°F | Ht 70.0 in | Wt 247.0 lb

## 2022-08-18 DIAGNOSIS — R0981 Nasal congestion: Secondary | ICD-10-CM | POA: Diagnosis not present

## 2022-08-18 DIAGNOSIS — Z23 Encounter for immunization: Secondary | ICD-10-CM | POA: Diagnosis not present

## 2022-08-18 DIAGNOSIS — I5032 Chronic diastolic (congestive) heart failure: Secondary | ICD-10-CM

## 2022-08-18 DIAGNOSIS — I480 Paroxysmal atrial fibrillation: Secondary | ICD-10-CM

## 2022-08-18 DIAGNOSIS — I2510 Atherosclerotic heart disease of native coronary artery without angina pectoris: Secondary | ICD-10-CM | POA: Diagnosis not present

## 2022-08-18 MED ORDER — AZELASTINE-FLUTICASONE 137-50 MCG/ACT NA SUSP: 1.0000 | Freq: Two times a day (BID) | NASAL | 5 refills | Status: DC

## 2022-08-18 NOTE — Progress Notes (Signed)
Subjective:  Patient ID: John Kemp, male    DOB: 1946/03/07  Age: 76 y.o. MRN: [id number]  Chief Complaint  Patient presents with   Leg Swelling   Nasal Congestion   Sinusitis    HPI Patient is in today for pedal edema x 2 weeks. He discontinued Lasix.  Started on torsemide 20 mg daily. Recommend compression stockings. Recommend elevate legs.   He also mentioned nasal congestion every day and azelastine nasal spray is not helping. This has been happening for several weeks. Flonase was discontinued when started azelastine nasal spray.    Current Outpatient Medications on File Prior to Visit  Medication Sig Dispense Refill   acetaminophen (TYLENOL) 500 MG tablet Take 500 mg by mouth 2 (two) times daily.     amiodarone (PACERONE) 200 MG tablet Take 1 tablet (200 mg total) by mouth daily. 90 tablet 2   amLODipine (NORVASC) 5 MG tablet TAKE 1 TABLET (5 MG TOTAL) BY MOUTH DAILY. 90 tablet 1   atorvastatin (LIPITOR) 20 MG tablet TAKE 1 TABLET BY MOUTH EVERY DAY 90 tablet 0   ELIQUIS 5 MG TABS tablet TAKE 1 TABLET BY MOUTH TWICE A DAY 180 tablet 1   finasteride (PROSCAR) 5 MG tablet Take 1 tablet (5 mg total) by mouth daily. 90 tablet 1   Inulin (FIBER CHOICE PO) Take 3 tablets by mouth daily as needed (Constipation).     ketoconazole (NIZORAL) 2 % shampoo Apply topically 3 (three) times a week.     levothyroxine (SYNTHROID) 100 MCG tablet Take 1 tablet (100 mcg total) by mouth daily. 90 tablet 1   nitroGLYCERIN (NITROSTAT) 0.4 MG SL tablet Place 0.4 mg under the tongue every 5 (five) minutes as needed for chest pain.     nystatin cream (MYCOSTATIN) Apply topically.     potassium chloride (KLOR-CON M) 10 MEQ tablet Take 10 mEq by mouth daily as needed (take with furosemide).     tamsulosin (FLOMAX) 0.4 MG CAPS capsule TAKE 1 CAPSULE BY MOUTH EVERY DAY 90 capsule 1   torsemide (DEMADEX) 20 MG tablet TAKE 1 TABLET BY MOUTH EVERY DAY 90 tablet 1   triamcinolone cream (KENALOG) 0.1 %  SMARTSIG:1 Application Topical 2-3 Times Daily     valsartan (DIOVAN) 320 MG tablet Take 1 tablet (320 mg total) by mouth daily. 90 tablet 1   Vitamin D, Ergocalciferol, (DRISDOL) 1.25 MG (50000 UNIT) CAPS capsule Take 1 capsule (50,000 Units total) by mouth every 7 (seven) days. 5 capsule 2   zolpidem (AMBIEN) 10 MG tablet Take 1 tablet (10 mg total) by mouth at bedtime. 30 tablet 3   No current facility-administered medications on file prior to visit.   Past Medical History:  Diagnosis Date   A-fib ([place])    Acquired thrombophilia ([place]) 11/17/2019   Acute bronchitis due to other specified organisms 10/28/2021   Acute cough 10/20/2021   Acute non-recurrent maxillary sinusitis 10/28/2021   Anticoagulated 01/21/2015   Atherosclerotic heart disease of native coronary artery without angina pectoris    Atrial fibrillation with rapid ventricular response ([place]) 11/12/2021   Atrial flutter with rapid ventricular response ([place]) 11/12/2021   CAD in native artery 01/21/2015   Cancer (HCC)    skin cancer   CHF (congestive heart failure) ([place]) 11/05/2019   Chronic diastolic (congestive) heart failure (HCC)    Chronic diastolic heart failure ([place]) 01/21/2015   Last Assessment & Plan:  Is congestive heart failure is mildly decompensated he is edematous he'll increase the  dose of his diuretic today the lab work performed including an ARB referred to care transitions for home care he'll follow-up in my office with me in 4-6 weeks daily weights sodium restrictionand compliance of medications beinghemoglobin of will   Chronic obstructive pulmonary disease (COPD) ([place])    Coronary artery disease involving autologous artery coronary bypass graft 11/12/2021   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 11/17/2019   Dizziness 11/12/2021   Dyslipidemia 11/12/2021   Elevated brain natriuretic peptide (BNP) level 11/12/2021   Essential hypertension 01/21/2015   right superior quadrantanopsia    Hearing loss of right ear due to cerumen impaction 10/20/2021   High cholesterol 10/20/2017   Hx of CABG 03/02/2017   Hypertension    Hypertensive heart disease with heart failure ([place])    Hypothyroidism (acquired) 04/24/2018   Impaired fasting glucose    Left rotator cuff tear 11/12/2021   Mixed hyperlipidemia 11/17/2019   Myocardial infarction ([place])    Non-seasonal allergic rhinitis due to pollen 10/20/2021   On amiodarone therapy 01/21/2015   PAF (paroxysmal atrial fibrillation) ([place])    Primary insomnia    Sequela of cardioembolic stroke 79/09/4095   Severe obesity with body mass index (BMI) of 36.0 to 36.9 with serious comorbidity ([place])    Squamous cell carcinoma 11/05/2019   Typical atrial flutter ([place]) 01/21/2015   Vitamin D deficiency, unspecified    Past Surgical History:  Procedure Laterality Date   APPENDECTOMY  1956   CARDIAC CATHETERIZATION     CARDIOVERSION  2002   CHOLECYSTECTOMY  2006   CORONARY ARTERY BYPASS GRAFT  2001   HERNIA REPAIR     RADIOLOGY WITH ANESTHESIA Bilateral 01/18/2018   Procedure: MRI WITH ANESTHESIA LUMBAR SPINE WITH AND WITHOUT CONTRAST;  Surgeon: Radiologist, Medication, MD;  Location: [facility];  Service: Radiology;  Laterality: Bilateral;   RADIOLOGY WITH ANESTHESIA N/A 04/19/2022   Procedure: MRI WITH BRAIN WITH AND WITHOUT CONTRAST;  Surgeon: Radiologist, Medication, MD;  Location: [facility];  Service: Radiology;  Laterality: N/A;   ROTATOR CUFF REPAIR Left    TONSILLECTOMY  1975    Family History  Problem Relation Age of Onset   Heart disease Father    Arrhythmia Father    Breast cancer Mother    Diabetes type II Sister    Diabetes Brother    Social History   Socioeconomic History   Marital status: Married    Spouse name: Not on file   Number of children: Not on file   Years of education: Not on file   Highest education level: Not on file  Occupational History   Occupation: Retired  Tobacco Use   Smoking status: Former     Packs/day: 1.00    Years: 15.00    Total pack years: 15.00    Types: Cigarettes    Quit date: 08/29/1978    Years since quitting: 44.0    Passive exposure: Past   Smokeless tobacco: Never  Vaping Use   Vaping Use: Never used  Substance and Sexual Activity   Alcohol use: No    Alcohol/week: 0.0 standard drinks of alcohol   Drug use: No   Sexual activity: Not Currently  Other Topics Concern   Not on file  Social History Narrative   Not on file   Social Determinants of Health   Financial Resource Strain: High Risk (01/28/2022)   Overall Financial Resource Strain (CARDIA)    Difficulty of Paying Living Expenses: Hard  Food Insecurity: No Food Insecurity (12/07/2020)  Hunger Vital Sign    Worried About Running Out of Food in the Last Year: Never true    Ran Out of Food in the Last Year: Never true  Transportation Needs: No Transportation Needs (01/28/2022)   PRAPARE - Hydrologist (Medical): No    Lack of Transportation (Non-Medical): No  Physical Activity: Not on file  Stress: Not on file  Social Connections: Not on file    Review of Systems  Constitutional:  Negative for chills, fatigue, fever and unexpected weight change.  HENT:  Positive for congestion. Negative for ear pain, sinus pain and sore throat.   Respiratory:  Negative for cough and shortness of breath.   Cardiovascular:  Negative for chest pain and palpitations.  Gastrointestinal:  Negative for abdominal pain, blood in stool, constipation, diarrhea, nausea and vomiting.  Endocrine: Negative for polydipsia.  Genitourinary:  Negative for dysuria.  Musculoskeletal:  Negative for back pain.  Skin:  Negative for rash.  Neurological:  Negative for headaches.     Objective:  BP 100/70   Pulse 68   Temp 97.7 F (36.5 C)   Ht '5\' 10"'$  (1.778 m)   Wt 247 lb (112 kg)   SpO2 97%   BMI 35.44 kg/m      08/18/2022    1:27 PM 07/05/2022    3:47 PM 06/03/2022    9:18 AM  BP/Weight   Systolic BP 654 650 354  Diastolic BP 70 74 60  Wt. (Lbs) 247 244 240  BMI 35.44 kg/m2 35.01 kg/m2 34.44 kg/m2    Physical Exam Constitutional:      Appearance: Normal appearance.  HENT:     Right Ear: Tympanic membrane, ear canal and external ear normal.     Left Ear: Tympanic membrane, ear canal and external ear normal.     Nose: Congestion and rhinorrhea present.     Mouth/Throat:     Mouth: Mucous membranes are moist.     Pharynx: No oropharyngeal exudate or posterior oropharyngeal erythema.  Cardiovascular:     Rate and Rhythm: Normal rate and regular rhythm.     Heart sounds: Normal heart sounds.  Pulmonary:     Effort: Pulmonary effort is normal. No respiratory distress.     Breath sounds: Normal breath sounds. No wheezing, rhonchi or rales.  Musculoskeletal:     Right lower leg: No edema.     Left lower leg: No edema.  Lymphadenopathy:     Cervical: No cervical adenopathy.  Neurological:     Mental Status: He is alert and oriented to person, place, and time.  Psychiatric:        Mood and Affect: Mood normal.        Behavior: Behavior normal.     Diabetic Foot Exam - Simple   No data filed      Lab Results  Component Value Date   WBC 5.7 06/03/2022   HGB 14.2 06/03/2022   HCT 40.7 06/03/2022   PLT 178 06/03/2022   GLUCOSE 87 07/05/2022   CHOL 144 06/03/2022   TRIG 84 06/03/2022   HDL 48 06/03/2022   LDLCALC 80 06/03/2022   ALT 22 07/05/2022   AST 22 07/05/2022   NA 140 07/05/2022   K 3.9 07/05/2022   CL 101 07/05/2022   CREATININE 1.28 (H) 07/05/2022   BUN 19 07/05/2022   CO2 26 07/05/2022   TSH 4.050 02/28/2022   INR 1.1 02/02/2022   HGBA1C 5.4 06/03/2022   MICROALBUR  Positive 150 01/23/2019      Assessment & Plan:   Problem List Items Addressed This Visit       Cardiovascular and Mediastinum   CAD in native artery    The current medical regimen is effective;  continue present plan and medications.       Relevant Orders   Amb  Referral to Cardiac Rehabilitation   Chronic diastolic heart failure (HCC)    Continue torsemide 20 mg daily and valsartan 320 mg daily.  Continue compression socks and elevation.       Relevant Orders   Amb Referral to Cardiac Rehabilitation   PAF (paroxysmal atrial fibrillation) (HCC)    The current medical regimen is effective;  continue present plan and medications. Continue amiodarone 200 mg daily and eliquis 5 mg twice daily.       Relevant Orders   Amb Referral to Cardiac Rehabilitation     Other   Nasal congestion - Primary    Rx for dymista sent.  Insurance denied coverage.  I resent flonase and instructed to take both flonase and azelastine.       Relevant Orders   Ambulatory referral to ENT   Encounter for immunization   Relevant Orders   [employer] Fall 2023 Covid-19 Vaccine 21yr and older (Completed)  .  Meds ordered this encounter  Medications   Azelastine-Fluticasone 137-50 MCG/ACT SUSP    Sig: Place 1 spray into the nose every 12 (twelve) hours.    Dispense:  23 g    Refill:  5    Orders Placed This Encounter  Procedures   [place] 20[place] Covid-19 Vaccine 168yrand older   Ambulatory referral to ENT   Amb Referral to Cardiac Rehabilitation     Follow-up: Return if symptoms worsen or fail to improve.  An After Visit Summary was printed and given to the patient.  KiRochel BromeMD [facility] ([phone]

## 2022-08-19 ENCOUNTER — Other Ambulatory Visit: Payer: Self-pay | Admitting: Family Medicine

## 2022-08-19 MED ORDER — FLUTICASONE PROPIONATE 50 MCG/ACT NA SUSP: 2.0000 | Freq: Every day | NASAL | 6 refills | Status: DC

## 2022-08-23 DIAGNOSIS — R0981 Nasal congestion: Secondary | ICD-10-CM | POA: Insufficient documentation

## 2022-08-23 DIAGNOSIS — Z23 Encounter for immunization: Secondary | ICD-10-CM

## 2022-08-23 HISTORY — DX: Encounter for immunization: Z23

## 2022-08-23 NOTE — Assessment & Plan Note (Signed)
>>  ASSESSMENT AND PLAN FOR PAF (PAROXYSMAL ATRIAL FIBRILLATION) ([place]) WRITTEN ON 08/23/2022 10:47 PM BY COX, KIRSTEN, MD  The current medical regimen is effective;  continue present plan and medications. Continue amiodarone 200 mg daily and eliquis 5 mg twice daily.

## 2022-08-23 NOTE — Assessment & Plan Note (Signed)
Rx for dymista sent.  Insurance denied coverage.  I resent flonase and instructed to take both flonase and azelastine.

## 2022-08-23 NOTE — Assessment & Plan Note (Signed)
The current medical regimen is effective;  continue present plan and medications.  

## 2022-08-23 NOTE — Assessment & Plan Note (Signed)
>>  ASSESSMENT AND PLAN FOR CHRONIC DIASTOLIC HEART FAILURE ([place]) WRITTEN ON 08/23/2022 10:47 PM BY COX, KIRSTEN, MD  Continue torsemide 20 mg daily and valsartan 320 mg daily.  Continue compression socks and elevation.

## 2022-08-23 NOTE — Assessment & Plan Note (Addendum)
Continue torsemide 20 mg daily and valsartan 320 mg daily.  Continue compression socks and elevation.

## 2022-08-23 NOTE — Assessment & Plan Note (Signed)
The current medical regimen is effective;  continue present plan and medications. Continue amiodarone 200 mg daily and eliquis 5 mg twice daily.

## 2022-08-23 NOTE — Assessment & Plan Note (Signed)
>>  ASSESSMENT AND PLAN FOR CAD IN NATIVE ARTERY WRITTEN ON 08/23/2022 10:45 PM BY COX, KIRSTEN, MD  The current medical regimen is effective;  continue present plan and medications.

## 2022-08-24 ENCOUNTER — Other Ambulatory Visit: Payer: Self-pay | Admitting: Family Medicine

## 2022-08-31 ENCOUNTER — Other Ambulatory Visit: Payer: Self-pay

## 2022-08-31 DIAGNOSIS — I6523 Occlusion and stenosis of bilateral carotid arteries: Secondary | ICD-10-CM

## 2022-09-02 ENCOUNTER — Other Ambulatory Visit: Payer: Self-pay | Admitting: Cardiology

## 2022-09-02 ENCOUNTER — Other Ambulatory Visit: Payer: Self-pay | Admitting: Family Medicine

## 2022-09-02 DIAGNOSIS — I110 Hypertensive heart disease with heart failure: Secondary | ICD-10-CM

## 2022-09-06 ENCOUNTER — Ambulatory Visit (INDEPENDENT_AMBULATORY_CARE_PROVIDER_SITE_OTHER): Payer: Medicare Other

## 2022-09-06 DIAGNOSIS — Z0000 Encounter for general adult medical examination without abnormal findings: Secondary | ICD-10-CM | POA: Diagnosis not present

## 2022-09-06 NOTE — Patient Instructions (Signed)
John Kemp , Thank you for taking time to come for your Medicare Wellness Visit. I appreciate your ongoing commitment to your health goals. Please review the following plan we discussed and let me know if I can assist you in the future.   Screening recommendations/referrals: Colonoscopy: done 11/06/2018 Recommended yearly ophthalmology/optometry visit for glaucoma screening and checkup Recommended yearly dental visit for hygiene and checkup  Vaccinations: Influenza vaccine: up-to-date Pneumococcal vaccine: Complete Tdap vaccine: 04/30/14, due 04/2024 Shingles vaccine: Due - you can get this at the pharmacy    Advanced directives: Please bring a copy for your medical record  Preventive Care 65 Years and Older, Male Preventive care refers to lifestyle choices and visits with your health care provider that can promote health and wellness. What does preventive care include? A yearly physical exam. This is also called an annual well check. Dental exams once or twice a year. Routine eye exams. Ask your health care provider how often you should have your eyes checked. Personal lifestyle choices, including: Daily care of your teeth and gums. Regular physical activity. Eating a healthy diet. Avoiding tobacco and drug use. Limiting alcohol use. Practicing safe sex. Taking low doses of aspirin every day. Taking vitamin and mineral supplements as recommended by your health care provider. What happens during an annual well check? The services and screenings done by your health care provider during your annual well check will depend on your age, overall health, lifestyle risk factors, and family history of disease. Counseling  Your health care provider may ask you questions about your: Alcohol use. Tobacco use. Drug use. Emotional well-being. Home and relationship well-being. Sexual activity. Eating habits. History of falls. Memory and ability to understand (cognition). Work and work  Statistician. Screening  You may have the following tests or measurements: Height, weight, and BMI. Blood pressure. Lipid and cholesterol levels. These may be checked every 5 years, or more frequently if you are over 43 years old. Skin check. Lung cancer screening. You may have this screening every year starting at age 57 if you have a 30-pack-year history of smoking and currently smoke or have quit within the past 15 years. Fecal occult blood test (FOBT) of the stool. You may have this test every year starting at age 51. Flexible sigmoidoscopy or colonoscopy. You may have a sigmoidoscopy every 5 years or a colonoscopy every 10 years starting at age 49. Prostate cancer screening. Recommendations will vary depending on your family history and other risks. Hepatitis C blood test. Hepatitis B blood test. Sexually transmitted disease (STD) testing. Diabetes screening. This is done by checking your blood sugar (glucose) after you have not eaten for a while (fasting). You may have this done every 1-3 years. Abdominal aortic aneurysm (AAA) screening. You may need this if you are a current or former smoker. Osteoporosis. You may be screened starting at age 23 if you are at high risk. Talk with your health care provider about your test results, treatment options, and if necessary, the need for more tests. Vaccines  Your health care provider may recommend certain vaccines, such as: Influenza vaccine. This is recommended every year. Tetanus, diphtheria, and acellular pertussis (Tdap, Td) vaccine. You may need a Td booster every 10 years. Zoster vaccine. You may need this after age 34. Pneumococcal 13-valent conjugate (PCV13) vaccine. One dose is recommended after age 26. Pneumococcal polysaccharide (PPSV23) vaccine. One dose is recommended after age 3. Talk to your health care provider about which screenings and vaccines you need  and how often you need them. This information is not intended to replace  advice given to you by your health care provider. Make sure you discuss any questions you have with your health care provider. Document Released: 09/11/2015 Document Revised: 05/04/2016 Document Reviewed: 06/16/2015 Elsevier Interactive Patient Education  2017 [place] Prevention in the Home Falls can cause injuries. They can happen to people of all ages. There are many things you can do to make your home safe and to help prevent falls. What can I do on the outside of my home? Regularly fix the edges of walkways and driveways and fix any cracks. Remove anything that might make you trip as you walk through a door, such as a raised step or threshold. Trim any bushes or trees on the path to your home. Use bright outdoor lighting. Clear any walking paths of anything that might make someone trip, such as rocks or tools. Regularly check to see if handrails are loose or broken. Make sure that both sides of any steps have handrails. Any raised decks and porches should have guardrails on the edges. Have any leaves, snow, or ice cleared regularly. Use sand or salt on walking paths during winter. Clean up any spills in your garage right away. This includes oil or grease spills. What can I do in the bathroom? Use night lights. Install grab bars by the toilet and in the tub and shower. Do not use towel bars as grab bars. Use non-skid mats or decals in the tub or shower. If you need to sit down in the shower, use a plastic, non-slip stool. Keep the floor dry. Clean up any water that spills on the floor as soon as it happens. Remove soap buildup in the tub or shower regularly. Attach bath mats securely with double-sided non-slip rug tape. Do not have throw rugs and other things on the floor that can make you trip. What can I do in the bedroom? Use night lights. Make sure that you have a light by your bed that is easy to reach. Do not use any sheets or blankets that are too big for your bed.  They should not hang down onto the floor. Have a firm chair that has side arms. You can use this for support while you get dressed. Do not have throw rugs and other things on the floor that can make you trip. What can I do in the kitchen? Clean up any spills right away. Avoid walking on wet floors. Keep items that you use a lot in easy-to-reach places. If you need to reach something above you, use a strong step stool that has a grab bar. Keep electrical cords out of the way. Do not use floor polish or wax that makes floors slippery. If you must use wax, use non-skid floor wax. Do not have throw rugs and other things on the floor that can make you trip. What can I do with my stairs? Do not leave any items on the stairs. Make sure that there are handrails on both sides of the stairs and use them. Fix handrails that are broken or loose. Make sure that handrails are as long as the stairways. Check any carpeting to make sure that it is firmly attached to the stairs. Fix any carpet that is loose or worn. Avoid having throw rugs at the top or bottom of the stairs. If you do have throw rugs, attach them to the floor with carpet tape. Make sure that you  have a light switch at the top of the stairs and the bottom of the stairs. If you do not have them, ask someone to add them for you. What else can I do to help prevent falls? Wear shoes that: Do not have high heels. Have rubber bottoms. Are comfortable and fit you well. Are closed at the toe. Do not wear sandals. If you use a stepladder: Make sure that it is fully opened. Do not climb a closed stepladder. Make sure that both sides of the stepladder are locked into place. Ask someone to hold it for you, if possible. Clearly mark and make sure that you can see: Any grab bars or handrails. First and last steps. Where the edge of each step is. Use tools that help you move around (mobility aids) if they are needed. These  include: Canes. Walkers. Scooters. Crutches. Turn on the lights when you go into a dark area. Replace any light bulbs as soon as they burn out. Set up your furniture so you have a clear path. Avoid moving your furniture around. If any of your floors are uneven, fix them. If there are any pets around you, be aware of where they are. Review your medicines with your doctor. Some medicines can make you feel dizzy. This can increase your chance of falling. Ask your doctor what other things that you can do to help prevent falls. This information is not intended to replace advice given to you by your health care provider. Make sure you discuss any questions you have with your health care provider. Document Released: 06/11/2009 Document Revised: 01/21/2016 Document Reviewed: 09/19/2014 Elsevier Interactive Patient Education  2017 [employer].

## 2022-09-06 NOTE — Progress Notes (Signed)
Subjective:   John Kemp is a 77 y.o. male who presents for Medicare Annual/Subsequent preventive examination. I connected with  John Kemp on 09/06/22 by a audio enabled telemedicine application and verified that I am speaking with the correct person using two identifiers.  Patient Location: Home  Provider Location: Office/Clinic  I discussed the limitations of evaluation and management by telemedicine. The patient expressed understanding and agreed to proceed.   Cardiac Risk Factors include: advanced age (>72mn, >>18women);dyslipidemia;male gender;obesity (BMI >30kg/m2)     Objective:    There were no vitals filed for this visit. There is no height or weight on file to calculate BMI.     04/19/2022    7:16 AM 11/29/2021    3:47 AM 09/07/2021    4:19 PM 02/18/2021   11:50 AM 07/21/2020   11:04 AM 01/18/2018    8:22 AM 04/02/2017    9:23 AM  Advanced Directives  Does Patient Have a Medical Advance Directive? Yes No No Yes Yes Yes Yes  Type of [place]   [place] will [place] will Living will;Healthcare Power of Attorney Living will  Does patient want to make changes to medical advance directive? No - Patient declined   No - Patient declined  No - Patient declined No - Patient declined  Copy of [place] Chart?    No - copy requested  No - copy requested   Would patient like information on creating a medical advance directive?  No - Patient declined         Current Medications (verified) Outpatient Encounter Medications as of 09/06/2022  Medication Sig   acetaminophen (TYLENOL) 500 MG tablet Take 500 mg by mouth 2 (two) times daily.   amiodarone (PACERONE) 200 MG tablet Take 1 tablet (200 mg total) by mouth daily.   amLODipine (NORVASC) 5 MG tablet Take 1 tablet (5 mg total) by mouth daily.   atorvastatin (LIPITOR) 20 MG tablet TAKE 1 TABLET  BY MOUTH EVERY DAY   Azelastine-Fluticasone 137-50 MCG/ACT SUSP Place 1 spray into the nose every 12 (twelve) hours.   ELIQUIS 5 MG TABS tablet TAKE 1 TABLET BY MOUTH TWICE A DAY   finasteride (PROSCAR) 5 MG tablet Take 1 tablet (5 mg total) by mouth daily.   fluticasone (FLONASE) 50 MCG/ACT nasal spray Place 2 sprays into both nostrils daily. Use in addition to azelastine.   Inulin (FIBER CHOICE PO) Take 3 tablets by mouth daily as needed (Constipation).   ketoconazole (NIZORAL) 2 % shampoo Apply topically 3 (three) times a week.   levothyroxine (SYNTHROID) 100 MCG tablet TAKE 1 TABLET BY MOUTH EVERY DAY   nitroGLYCERIN (NITROSTAT) 0.4 MG SL tablet Place 0.4 mg under the tongue every 5 (five) minutes as needed for chest pain.   nystatin cream (MYCOSTATIN) Apply topically.   potassium chloride (KLOR-CON M) 10 MEQ tablet Take 10 mEq by mouth daily as needed (take with furosemide).   tamsulosin (FLOMAX) 0.4 MG CAPS capsule TAKE 1 CAPSULE BY MOUTH EVERY DAY   torsemide (DEMADEX) 20 MG tablet TAKE 1 TABLET BY MOUTH EVERY DAY   triamcinolone cream (KENALOG) 0.1 % SMARTSIG:1 Application Topical 2-3 Times Daily   valsartan (DIOVAN) 320 MG tablet TAKE 1 TABLET BY MOUTH EVERY DAY   Vitamin D, Ergocalciferol, (DRISDOL) 1.25 MG (50000 UNIT) CAPS capsule Take 1 capsule (50,000 Units total) by mouth every 7 (seven) days.   zolpidem (  AMBIEN) 10 MG tablet Take 1 tablet (10 mg total) by mouth at bedtime.   No facility-administered encounter medications on file as of 09/06/2022.    Allergies (verified) Avelox [moxifloxacin hcl], Cartia xt [diltiazem hcl], Pravachol [pravastatin sodium], Prednisone, and Zetia [ezetimibe]   History: Past Medical History:  Diagnosis Date   A-fib ([place])    Acquired thrombophilia ([place]) 11/17/2019   Acute bronchitis due to other specified organisms 10/28/2021   Acute cough 10/20/2021   Acute non-recurrent maxillary sinusitis 10/28/2021   Anticoagulated 01/21/2015    Atherosclerotic heart disease of native coronary artery without angina pectoris    Atrial fibrillation with rapid ventricular response ([place]) 11/12/2021   Atrial flutter with rapid ventricular response ([place]) 11/12/2021   CAD in native artery 01/21/2015   Cancer ([place])    skin cancer   CHF (congestive heart failure) ([place]) 11/05/2019   Chronic diastolic (congestive) heart failure (HCC)    Chronic diastolic heart failure ([place]) 01/21/2015   Last Assessment & Plan:  Is congestive heart failure is mildly decompensated he is edematous he'll increase the dose of his diuretic today the lab work performed including an ARB referred to care transitions for home care he'll follow-up in my office with me in 4-6 weeks daily weights sodium restrictionand compliance of medications beinghemoglobin of will   Chronic obstructive pulmonary disease (COPD) ([place])    Coronary artery disease involving autologous artery coronary bypass graft 11/12/2021   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 11/17/2019   Dizziness 11/12/2021   Dyslipidemia 11/12/2021   Elevated brain natriuretic peptide (BNP) level 11/12/2021   Essential hypertension 01/21/2015   right superior quadrantanopsia   Hearing loss of right ear due to cerumen impaction 10/20/2021   High cholesterol 10/20/2017   Hx of CABG 03/02/2017   Hypertension    Hypertensive heart disease with heart failure ([place])    Hypothyroidism (acquired) 04/24/2018   Impaired fasting glucose    Left rotator cuff tear 11/12/2021   Mixed hyperlipidemia 11/17/2019   Myocardial infarction ([place])    Non-seasonal allergic rhinitis due to pollen 10/20/2021   On amiodarone therapy 01/21/2015   PAF (paroxysmal atrial fibrillation) (HCC)    Primary insomnia    Sequela of cardioembolic stroke 63/08/6008   Severe obesity with body mass index (BMI) of 36.0 to 36.9 with serious comorbidity ([place])    Squamous cell carcinoma 11/05/2019   Typical atrial flutter ([place]) 01/21/2015    Vitamin D deficiency, unspecified    Past Surgical History:  Procedure Laterality Date   APPENDECTOMY  1956   CARDIAC CATHETERIZATION     CARDIOVERSION  2002   CHOLECYSTECTOMY  2006   CORONARY ARTERY BYPASS GRAFT  2001   HERNIA REPAIR     RADIOLOGY WITH ANESTHESIA Bilateral 01/18/2018   Procedure: MRI WITH ANESTHESIA LUMBAR SPINE WITH AND WITHOUT CONTRAST;  Surgeon: Radiologist, Medication, MD;  Location: [facility];  Service: Radiology;  Laterality: Bilateral;   RADIOLOGY WITH ANESTHESIA N/A 04/19/2022   Procedure: MRI WITH BRAIN WITH AND WITHOUT CONTRAST;  Surgeon: Radiologist, Medication, MD;  Location: [facility];  Service: Radiology;  Laterality: N/A;   ROTATOR CUFF REPAIR Left    TONSILLECTOMY  1975   Family History  Problem Relation Age of Onset   Heart disease Father    Arrhythmia Father    Breast cancer Mother    Diabetes type II Sister    Diabetes Brother    Social History   Socioeconomic History   Marital status: Married    Spouse  name: Manuela Schwartz   Number of children: 2   Years of education: Not on file   Highest education level: Not on file  Occupational History   Occupation: Retired  Tobacco Use   Smoking status: Former    Packs/day: 1.00    Years: 15.00    Total pack years: 15.00    Types: Cigarettes    Quit date: 08/29/1978    Years since quitting: 44.0    Passive exposure: Past   Smokeless tobacco: Never  Vaping Use   Vaping Use: Never used  Substance and Sexual Activity   Alcohol use: No    Alcohol/week: 0.0 standard drinks of alcohol   Drug use: No   Sexual activity: Not Currently  Other Topics Concern   Not on file  Social History Narrative   Not on file   Social Determinants of Health   Financial Resource Strain: High Risk (01/28/2022)   Overall Financial Resource Strain (CARDIA)    Difficulty of Paying Living Expenses: Hard  Food Insecurity: No Food Insecurity (12/07/2020)   Hunger Vital Sign    Worried About Running Out of Food in the Last Year:  Never true    Ran Out of Food in the Last Year: Never true  Transportation Needs: No Transportation Needs (01/28/2022)   PRAPARE - Hydrologist (Medical): No    Lack of Transportation (Non-Medical): No  Physical Activity: Not on file  Stress: Not on file  Social Connections: Not on file    Tobacco Counseling Counseling given: Not Answered   Clinical Intake:  Pre-visit preparation completed: Yes Pain : No/denies pain   Nutritional Status: BMI > 30  Obese Diabetes: No How often do you need to have someone help you when you read instructions, pamphlets, or other written materials from your doctor or pharmacy?: 1 - Never Interpreter Needed?: No    Activities of Daily Living    09/06/2022   11:37 AM 04/19/2022    7:28 AM  In your present state of health, do you have any difficulty performing the following activities:  Hearing? 1   Comment bilateral hearing aids   Vision? 0   Difficulty concentrating or making decisions? 0   Walking or climbing stairs? 0   Dressing or bathing? 0   Doing errands, shopping? 0 0  Preparing Food and eating ? N   Using the Toilet? N   In the past six months, have you accidently leaked urine? N   Do you have problems with loss of bowel control? N   Managing your Medications? N   Managing your Finances? N   Housekeeping or managing your Housekeeping? N     Patient Care Team: [name], MD as PCP - General (Family Medicine) Lane Hacker, [facility] as Pharmacist (Pharmacist) Newt Lukes, AUD (Audiology) [name], MD as Consulting Physician (Cardiology)     Assessment:   This is a routine wellness examination for John Kemp.  Dietary issues and exercise activities discussed: Current Exercise Habits: The patient does not participate in regular exercise at present, Exercise limited by: None identified   Depression Screen    09/06/2022   11:37 AM 03/29/2022    3:44 PM 03/17/2022    2:44 PM 11/16/2021   11:20  AM 08/31/2021    8:44 AM 08/26/2021    4:20 PM 04/23/2021    8:49 AM  PHQ 2/9 Scores  PHQ - 2 Score 0 0 0 0 0 0 0    Fall  Risk    09/06/2022   11:36 AM 03/29/2022    3:44 PM 03/17/2022    2:43 PM 11/16/2021   11:19 AM 08/31/2021    8:44 AM  Fall Risk   Falls in the past year? 0 0 0 0 0  Number falls in past yr: 0 0 0 0 0  Injury with Fall? 0 0 0 0 0  Risk for fall due to : No Fall Risks History of fall(s) Impaired balance/gait    Follow up Falls evaluation completed;Education provided Falls evaluation completed Falls evaluation completed Falls evaluation completed Falls evaluation completed    [place]:  Any stairs in or around the home? No  If so, are there any without handrails?  N/a Home free of loose throw rugs in walkways, pet beds, electrical cords, etc? Yes  Adequate lighting in your home to reduce risk of falls? Yes   ASSISTIVE DEVICES UTILIZED TO PREVENT FALLS:  Life alert? No  Use of a cane, walker or w/c? No  Grab bars in the bathroom? No  Shower chair or bench in shower? No  Elevated toilet seat or a handicapped toilet? No   Cognitive Function:    04/23/2021    9:31 AM 09/25/2018   10:09 AM 06/20/2018    2:02 PM  MMSE - Mini Mental State Exam  Orientation to time '4 5 3  '$ Orientation to Place '5 5 5  '$ Registration '3 3 3  '$ Attention/ Calculation '2 5 3  '$ Recall '3 3 2  '$ Language- name 2 objects '2 2 2  '$ Language- repeat '1 1 1  '$ Language- follow 3 step command '3 3 3  '$ Language- read & follow direction '1 1 1  '$ Write a sentence '1 1 1  '$ Copy design '1 1 1  '$ Total score '26 30 25        '$ 09/06/2022   11:37 AM  6CIT Screen  What Year? 0 points  What month? 0 points  What time? 0 points  Count back from 20 0 points  Months in reverse 0 points  Repeat phrase 0 points  Total Score 0 points    Immunizations Immunization History  Administered Date(s) Administered   COVID-19, mRNA, vaccine(Comirnaty)12 years and older 08/18/2022   Fluad  Quad(high Dose 65+) 05/22/2020, 04/23/2021, 06/03/2022   Influenza Split 05/29/2014   Influenza-Unspecified 06/29/2018, 06/04/2019   Moderna SARS-COV2 Booster Vaccination 06/30/2020, 12/18/2020   Moderna Sars-Covid-2 Vaccination 10/25/2019, 11/27/2019   Pneumococcal Conjugate-13 07/12/2017   Pneumococcal Polysaccharide-23 05/29/2014, 07/19/2018   Tdap 04/30/2014    TDAP status: Up to date  Flu Vaccine status: Up to date  Pneumococcal vaccine status: Up to date  Covid-19 vaccine status: Completed vaccines  Qualifies for Shingles Vaccine? Yes   Zostavax completed No   Shingrix Completed?: No.    Education has been provided regarding the importance of this vaccine. Patient has been advised to call insurance company to determine out of pocket expense if they have not yet received this vaccine. Advised may also receive vaccine at local pharmacy or Health Dept. Verbalized acceptance and understanding.  Screening Tests Health Maintenance  Topic Date Due   Hepatitis C Screening  Never done   Zoster Vaccines- Shingrix (1 of 2) Never done   Medicare Annual Wellness (AWV)  08/26/2022   COVID-19 Vaccine (4 - 2023-24 season) 10/13/2022   DTaP/Tdap/Td (2 - Td or Tdap) 04/30/2024   COLONOSCOPY (Pts 45-97yr Insurance coverage will need to be confirmed)  11/05/2028  Pneumonia Vaccine 66+ Years old  Completed   INFLUENZA VACCINE  Completed   HPV VACCINES  Aged Out    Health Maintenance  Health Maintenance Due  Topic Date Due   Hepatitis C Screening  Never done   Zoster Vaccines- Shingrix (1 of 2) Never done   Medicare Annual Wellness (AWV)  08/26/2022    Colorectal cancer screening: Type of screening: Colonoscopy. Completed 11/06/2018. Repeat every 10 years  Lung Cancer Screening: (Low Dose CT Chest recommended if Age 106-80 years, 30 pack-year currently smoking OR have quit w/in 15years.) does not qualify.   Lung Cancer Screening Referral: N/A  Additional Screening:  Hepatitis C  Screening: does qualify; Completed Never  Vision Screening: Recommended annual ophthalmology exams for early detection of glaucoma and other disorders of the eye. Is the patient up to date with their annual eye exam?  Yes   Dental Screening: Recommended annual dental exams for proper oral hygiene  Community Resource Referral / Chronic Care Management: CRR required this visit?  No   CCM required this visit?  No      Plan:    1- Shingrix Vaccine recommended - patient advised to get at pharmacy  I have personally reviewed and noted the following in the patient's chart:   Medical and social history Use of alcohol, tobacco or illicit drugs  Current medications and supplements including opioid prescriptions.  Functional ability and status Nutritional status Physical activity Advanced directives List of other physicians Hospitalizations, surgeries, and ER visits in previous 12 months Vitals Screenings to include cognitive, depression, and falls Referrals and appointments  In addition, I have reviewed and discussed with patient certain preventive protocols, quality metrics, and best practice recommendations. A written personalized care plan for preventive services as well as general preventive health recommendations were provided to patient.     Erie Noe, LPN   03/03/2262

## 2022-09-09 ENCOUNTER — Ambulatory Visit: Payer: Medicare Other

## 2022-09-09 NOTE — Patient Outreach (Signed)
Care Management & Coordination Services Pharmacy Note  09/09/2022 Name:  John Kemp MRN:  [id number] DOB:  [birth year]   Recommendations/Changes made from today's visit: -Patient has Finasteride on medslist but hasn't taken in 6 months. Will co-sign PCP to determine how to proceed -On 05/05/22 Cardio wrote, "His lipids is at target continue high intensity statin" but patient is on Atorvastatin '20mg'$ . Will double check with Cardio    Subjective: John Kemp is an 77 y.o. year old male who is a primary patient of Cox, Kirsten, MD.  The care coordination team was consulted for assistance with disease management and care coordination needs.    Engaged with patient by telephone for follow up visit.  Recent office visits:  07-05-2022 [name], MD. Creatinine= 1.28, eGFR= 59, Albumin/Globulin Ratio= 2.4. STOP lasix. START torsemide 20 mg daily.   Recent consult visits:  07-18-2022 Newt Lukes Carrier, AuD CCC-A (Audiology). Visit to discuss treatment options for binaural sensorineural hearing loss (SNHL) that is worse in the right ear.    Hospital visits:  None in previous 6 months   Objective:  Lab Results  Component Value Date   CREATININE 1.28 (H) 07/05/2022   BUN 19 07/05/2022   EGFR 59 (L) 07/05/2022   GFRNONAA >60 04/19/2022   GFRAA 54 (L) 09/21/2020   NA 140 07/05/2022   K 3.9 07/05/2022   CALCIUM 8.9 07/05/2022   CO2 26 07/05/2022   GLUCOSE 87 07/05/2022    Lab Results  Component Value Date/Time   HGBA1C 5.4 06/03/2022 10:25 AM   HGBA1C 5.4 04/23/2021 09:49 AM   MICROALBUR Positive 150 01/23/2019 12:00 AM    Last diabetic Eye exam: No results found for: "HMDIABEYEEXA"  Last diabetic Foot exam: No results found for: "HMDIABFOOTEX"   Lab Results  Component Value Date   CHOL 144 06/03/2022   HDL 48 06/03/2022   LDLCALC 80 06/03/2022   TRIG 84 06/03/2022   CHOLHDL 3.0 06/03/2022       Latest Ref Rng & Units 07/05/2022    4:19 PM 06/20/2022     8:02 AM 06/03/2022   10:25 AM  Hepatic Function  Total Protein 6.0 - 8.5 g/dL 6.5  6.8  7.0   Albumin 3.8 - 4.8 g/dL 4.6  4.4  4.6   AST 0 - 40 IU/L '22  23  22   '$ ALT 0 - 44 IU/L '22  21  20   '$ Alk Phosphatase 44 - 121 IU/L 88  92  91   Total Bilirubin 0.0 - 1.2 mg/dL 0.7  0.6  1.0     Lab Results  Component Value Date/Time   TSH 4.050 02/28/2022 09:34 AM   TSH 1.080 12/24/2021 10:58 AM   FREET4 1.89 (H) 02/28/2022 09:34 AM   FREET4 2.35 (H) 12/24/2021 10:58 AM       Latest Ref Rng & Units 06/03/2022   10:25 AM 04/19/2022    7:21 AM 02/09/2022   12:09 PM  CBC  WBC 3.4 - 10.8 x10E3/uL 5.7  5.6  6.2   Hemoglobin 13.0 - 17.7 g/dL 14.2  14.5  14.4   Hematocrit 37.5 - 51.0 % 40.7  41.1  41.8   Platelets 150 - 450 x10E3/uL 178  164  173     Lab Results  Component Value Date/Time   VD25OH 44.8 06/03/2022 10:25 AM   VD25OH 25.4 (L) 02/28/2022 09:34 AM   VITAMINB12 237 06/03/2022 10:25 AM   VITAMINB12 275 10/23/2017 12:35 PM  Clinical ASCVD: Yes  The ASCVD Risk score (Arnett DK, et al., 2019) failed to calculate for the following reasons:   The patient has a prior MI or stroke diagnosis    Other: (CHADS2VASc if Afib, MMRC or CAT for COPD, ACT, DEXA)     09/06/2022   11:37 AM 03/29/2022    3:44 PM 03/17/2022    2:44 PM  Depression screen PHQ 2/9  Decreased Interest 0 0 0  Down, Depressed, Hopeless 0 0 0  PHQ - 2 Score 0 0 0     Social History   Tobacco Use  Smoking Status Former   Packs/day: 1.00   Years: 15.00   Total pack years: 15.00   Types: Cigarettes   Quit date: 08/29/1978   Years since quitting: 44.0   Passive exposure: Past  Smokeless Tobacco Never   BP Readings from Last 3 Encounters:  08/18/22 100/70  07/05/22 122/74  06/03/22 110/60   Pulse Readings from Last 3 Encounters:  08/18/22 68  07/05/22 (!) 56  06/03/22 63   Wt Readings from Last 3 Encounters:  08/18/22 247 lb (112 kg)  07/05/22 244 lb (110.7 kg)  06/03/22 240 lb (108.9 kg)   BMI  Readings from Last 3 Encounters:  08/18/22 35.44 kg/m  07/05/22 35.01 kg/m  06/03/22 34.44 kg/m    Allergies  Allergen Reactions   Avelox [Moxifloxacin Hcl] Other (See Comments)    Unknown   Cartia Xt [Diltiazem Hcl] Nausea And Vomiting   Pravachol [Pravastatin Sodium] Other (See Comments)    myalgia   Prednisone Other (See Comments)    "makes me feel terrible"   Zetia [Ezetimibe] Other (See Comments)    myalgias    Medications Reviewed Today     Reviewed by Erie Noe, LPN (Licensed Practical Nurse) on 09/06/22 at 1152  Med List Status: <None>   Medication Order Taking? Sig Documenting Provider Last Dose Status Informant  acetaminophen (TYLENOL) 500 MG tablet [id number] Yes Take 500 mg by mouth 2 (two) times daily. [provider] Taking Active Self  amiodarone (PACERONE) 200 MG tablet [id number] Yes Take 1 tablet (200 mg total) by mouth daily. [name], MD Taking Active Self  amLODipine (NORVASC) 5 MG tablet [id number] Yes Take 1 tablet (5 mg total) by mouth daily. [name], MD Taking Active   atorvastatin (LIPITOR) 20 MG tablet [id number] Yes TAKE 1 TABLET BY MOUTH EVERY Luster Landsberg, MD Taking Active   Azelastine-Fluticasone 137-50 MCG/ACT SUSP [id number] Yes Place 1 spray into the nose every 12 (twelve) hours. [name], MD Taking Active   ELIQUIS 5 MG TABS tablet [id number] Yes TAKE 1 TABLET BY MOUTH TWICE A DAY Cox, Kirsten, MD Taking Active Self  finasteride (PROSCAR) 5 MG tablet [id number] Yes Take 1 tablet (5 mg total) by mouth daily. Cox, Kirsten, MD Taking Active Self  fluticasone (FLONASE) 50 MCG/ACT nasal spray [id number] Yes Place 2 sprays into both nostrils daily. Use in addition to azelastine. Cox, Kirsten, MD Taking Active   Inulin (FIBER CHOICE PO) [id number] Yes Take 3 tablets by mouth daily as needed (Constipation). [provider] Taking Active Self  ketoconazole (NIZORAL) 2 % shampoo [id number] Yes Apply topically 3  (three) times a week. [provider] Taking Active   levothyroxine (SYNTHROID) 100 MCG tablet [id number] Yes TAKE 1 TABLET BY MOUTH EVERY DAY Cox, Kirsten, MD Taking Active   nitroGLYCERIN (NITROSTAT) 0.4 MG SL tablet [id number] Yes Place 0.4 mg under the tongue every 5 (five)  minutes as needed for chest pain. [provider] Taking Active Self  nystatin cream (MYCOSTATIN) [id number] Yes Apply topically. [provider] Taking Active   potassium chloride (KLOR-CON M) 10 MEQ tablet [id number] Yes Take 10 mEq by mouth daily as needed (take with furosemide). [provider] Taking Active Self  tamsulosin (FLOMAX) 0.4 MG CAPS capsule [id number] Yes TAKE 1 CAPSULE BY MOUTH EVERY DAY Cox, Kirsten, MD Taking Active   torsemide (DEMADEX) 20 MG tablet [id number] Yes TAKE 1 TABLET BY MOUTH EVERY DAY Cox, Kirsten, MD Taking Active   triamcinolone cream (KENALOG) 0.1 % [id number] Yes SMARTSIG:1 Application Topical 2-3 Times Daily [provider] Taking Active   valsartan (DIOVAN) 320 MG tablet [id number] Yes TAKE 1 TABLET BY MOUTH EVERY DAY Cox, Kirsten, MD Taking Active   Vitamin D, Ergocalciferol, (DRISDOL) 1.25 MG (50000 UNIT) CAPS capsule [id number] Yes Take 1 capsule (50,000 Units total) by mouth every 7 (seven) days. Cox, Kirsten, MD Taking Active Self  zolpidem (AMBIEN) 10 MG tablet [id number] Yes Take 1 tablet (10 mg total) by mouth at bedtime. Cox, Elnita Maxwell, MD Taking Active             SDOH:  (Social Determinants of Health) assessments and interventions performed: Yes SDOH Interventions    Flowsheet Row Care Coordination from 09/09/2022 in [facility] from 01/28/2022 in [facility] from 06/07/2021 in [facility] Interventions     Transportation Interventions Intervention Not Indicated Intervention Not Indicated --  Financial Strain Interventions -- Other (Comment) Other (Comment)   [Will try PAP]      SDOH Screenings   Food Insecurity: No Food Insecurity (12/07/2020)  Housing: Low Risk  (12/07/2020)  Transportation Needs: No Transportation Needs (09/09/2022)  Alcohol Screen: Low Risk  (04/05/2021)  Depression (PHQ2-9): Low Risk  (09/06/2022)  Financial Resource Strain: High Risk (01/28/2022)  Tobacco Use: Medium Risk (09/06/2022)    Medication Assistance: None required.  Patient affirms current coverage meets needs.   Name and location of Current pharmacy:  [id number] [id number]: [phone]: [address] [id number]: [phone]: [phone]  Compliance/Adherence/Medication fill history: Care Gaps: Last annual wellness visit? 08-26-2021    Hep C screening overdue Shingrix overdue 3rd Moderna vaccine overdu   Star Rating Drugs: Atorvastatin 10 mg- Last filled 05-29-2022 90 DS. Previous 02-28-2022 90 DS  Valsartan 320 mg- Last filled 06-04-2022 90 DS. Previous 03-04-2022 90 DS    Assessment/Plan   Hypertension (BP goal <130/80) -Controlled -Current treatment: Amlodipine 5 mg bid Appropriate, Effective, Safe, Accessible Torsemide '20mg'$  Appropriate, Effective, Safe, Accessible Valsartan 320 mg daily Appropriate, Effective, Safe, Accessible Potassium 10 meq daily prn Appropriate, Effective, Safe, Accessible -Medications previously tried: diltiazem, Carvedilol -Current home readings:  Jan 2024: 123/68 131/70 109/67 -Current dietary habits: sandwich, cookies, home cooking -Current exercise habits: no regular exercise -Denies hypotensive/hypertensive symptoms -Educated on BP goals and benefits of medications for prevention of heart attack, stroke and kidney damage; Daily salt intake goal < 2300 mg; Exercise goal of 150 minutes per week; Importance of home blood  pressure monitoring; -Counseled to monitor BP at home weekly, document, and provide log at future appointments -Counseled on diet and exercise extensively Recommended to continue current medication Recommended beginning to check bp weekly at home  Hyperlipidemia: (LDL goal < 55) The ASCVD Risk score (Arnett DK, et al., 2019) failed to calculate for the following reasons:   The patient has a prior MI or stroke diagnosis Lab Results  Component Value Date   CHOL 144 06/03/2022   CHOL 143 02/09/2022   CHOL 123 11/01/2021   Lab Results  Component Value Date   HDL 48 06/03/2022   HDL 48 02/09/2022   HDL 43 11/01/2021   Lab Results  Component Value Date   LDLCALC 80 06/03/2022   LDLCALC 78 02/09/2022   LDLCALC 67 11/01/2021   Lab Results  Component Value Date   TRIG 84 06/03/2022   TRIG 92 02/09/2022   TRIG 63 11/01/2021   Lab Results  Component Value Date   CHOLHDL 3.0 06/03/2022   CHOLHDL 3.0 02/09/2022   CHOLHDL 2.9 11/01/2021   No results found for: "LDLDIRECT" Last vitamin D Lab Results  Component Value Date   VD25OH 44.8 06/03/2022   Lab Results  Component Value Date   TSH 4.050 02/28/2022   -Not ideally controlled -Current treatment: atorvastatin 20 mg daily at bedtime Appropriate, Effective, Safe, Accessible -Medications previously tried: none reported  -Current dietary patterns: no specific diet.  -Current exercise habits: no regular exercise -Educated on Cholesterol goals;  Benefits of statin for ASCVD risk reduction; Importance of limiting foods high in cholesterol; Exercise goal of 150 minutes per week; -Counseled on diet and exercise extensively Jan 2024: On 05/05/22 Cardio wrote, "His lipids is at target continue high intensity statin" but patient is on Atorvastatin '20mg'$ . Will double check with Cardio   Atrial Fibrillation (Goal: prevent stroke and major bleeding) -Managed by Dr. Bettina Gavia -Controlled -CHADSVASC: 5 -Current treatment: Rhythm  control:  Amiodarone 200 mg daily Appropriate, Effective, Safe, Accessible Anticoagulation:   Apixaban 5 mg bid Appropriate, Effective, Safe, Query accessible -Medications previously tried: diltiazem -Home BP and HR readings: not checking currently  -Counseled on increased risk of stroke due to Afib and benefits of anticoagulation for stroke prevention; importance of adherence to anticoagulant exactly as prescribed; bleeding risk associated with Eliquis and importance of self-monitoring for signs/symptoms of bleeding; avoidance of NSAIDs due to increased bleeding risk with anticoagulants; Coordinating patient assistance application for Eliquis and samples of Eliquis. -Recommended to continue current medication April 2022: Assessed patient finances. Patient has not spent 3% of income on medication at this time. Will continue to check for Eliquis PAP application October 9449: Will have team check on status of PAP, if not approved, will consider talking with PCP/Specialist about Xarelto PAP if it's possible to have that one approved easier than Eliquis. Patient told me he asked Cardio about this as well June 2023: Unable to get PAP approves       Heart Failure (Goal: manage symptoms and prevent exacerbations) -Managed by Dr. Bettina Gavia -Controlled -Last ejection fraction: 08/18 (Date: 55-60%) -HF type: Diastolic -Current treatment: Amlodipine 5 mg daily Appropriate, Effective, Safe, Accessible Furosemide 40 mg PRN Appropriate, Effective, Safe, Accessible Valsartan 320 mg daily Appropriate, Effective, Safe, Accessible Potassium 10 meq daily prn Appropriate, Effective, Safe, Accessible Nitroglycerin 0.4 mg sl every 5 minutes prn chest pain Appropriate, Effective, Safe, Accessible -Medications previously tried: diltiazem   -Current home BP/HR readings: none reported/not checking at home -Current dietary habits: denies specific diet. Eats sandwiches some and wife cooks some.  -Current exercise  habits: no regular exercise -Educated on Benefits of medications for managing symptoms and prolonging life Proper diuretic administration and potassium supplementation Importance of blood pressure control -  Counseled on diet and exercise extensively Recommended to continue current medication   Hypothyroid (Goal: manage TSH/Symptoms ) Lab Results  Component Value Date   TSH 4.050 02/28/2022   -Current treatment  levothyroxine 112 mcg daily Appropriate, Effective, Safe, Accessible -Medications previously tried: none reported  -Recommended to continue current medication     Memory (Goal: manage memory) -Controlled -Current treatment  None -Medications previously tried: Donepezil -Recommended to continue current medication   Insomnia (Goal: 7-8 hours of sleep/night) -Controlled -Patient currently getting 8 hours of sleep/night -Patient currently waking up 1 times/night -Patient's concern is Falling asleep -Current treatment: Zolpidem '10mg'$  take '5mg'$  HS Query Appropriate, Effective, Query Safe, Accessible -Medications previously tried: N/A -Counseled on sleep hygiene techniques (consistent sleep/wake up schedule, no electronic screens 1-2 hours before bed, etc) June 2023: Patient experiences vertigo during the night (Went to the ER twice for it). Dr. Bettina Gavia on his 01/10/22 visit thinks it's the Zolpidem and I agree. Patient has cut in half and still experiencing ADR's. States if he doesn't take it at all, he can't sleep and just stares at the ceiling. Will ask PCP for alternative therapy Jan 2024: Patient states vertigo is completely gone.  ENT thought he had infection (Lost hearing in ear). Now has hearing aids and can hear and it's better    BPH (Goal: improve urination) -IPSS Questionnaire (AUA-7): 01/28/22     Over the past month.    1)  How often have you had a sensation of not emptying your bladder completely after you finish urinating?  1 - Less than 1 time in 5  2)  How often  have you had to urinate again less than two hours after you finished urinating? 1 - Less than 1 time in 5  3)  How often have you found you stopped and started again several times when you urinated?  0 - Not at all  4) How difficult have you found it to postpone urination?  0 - Not at all  5) How often have you had a weak urinary stream?  5 - Almost always  6) How often have you had to push or strain to begin urination?  0 - Not at all  7) How many times did you most typically get up to urinate from the time you went to bed until the time you got up in the morning?  1 - 1 time  Total score:  0-7 mildly symptomatic    8-19 moderately symptomatic    20-35 severely symptomatic    -Not ideally controlled -Night time urination frequency: 1/night -Current treatment: Tamsulosin 0.'4mg'$  take 2HS Appropriate, Effective, Safe, Accessible Finasteride '5mg'$  HS Query-Appropriate -Medications previously tried: N/A -Counseled on non-pharmacologic techniques such as Kegels June 2023: Spent extensive time counseling on Kegels and how Finasteride will take a while to "kick in" Jan 2024: Merlene Laughter is on medslist but patient states he isn't taking (Hasn't picked up in months). Will verify with PCP       Patient Goals/Self-Care Activities Patient will:  - take medications as prescribed focus on medication adherence by considering using a pill box target a minimum of 150 minutes of moderate intensity exercise weekly engage in dietary modifications by limiting salt and focusing on heart-healthy diet.    Follow Up Plan: Telephone follow up appointment with care management team member scheduled for: prn   [name], Pharm.D. - [phone]

## 2022-09-11 NOTE — Progress Notes (Signed)
Subjective:  Patient ID: John Kemp, male    DOB: [birth year]  Age: 77 y.o. MRN: [id number]  Chief Complaint  Patient presents with   Hyperlipidemia   Hypothyroidism   Congestive Heart Failure    HPI Afib: Taking Eliquis 5 mg twice a day, Amiodarone 200 mg daily.  Hypertension: Takes Amlodipine 5 mg daily, Valsartan 1 tablet  320 mg daily.  Hypothyroidism: Currently on Levothyroxine 100 mcg daily.  BPH: Tamsulosin 0.4 mg daily, Finasteride 5 mg daily. No trouble urinating.   Hyperlipidemia: Atorvastatin 20 mg every day.  CONGESTIVE HEART FAILURE/CAD: Currently on torsemide 20 mg every day, ATORVASTATIN, AND HAS NTG.   Eating poorly. Not exercising. Scheduled to go to cardiopulmonary rehab.   Current Outpatient Medications on File Prior to Visit  Medication Sig Dispense Refill   acetaminophen (TYLENOL) 500 MG tablet Take 500 mg by mouth 2 (two) times daily.     amiodarone (PACERONE) 200 MG tablet Take 1 tablet (200 mg total) by mouth daily. 90 tablet 2   amLODipine (NORVASC) 5 MG tablet Take 1 tablet (5 mg total) by mouth daily. 90 tablet 1   atorvastatin (LIPITOR) 20 MG tablet TAKE 1 TABLET BY MOUTH EVERY DAY 90 tablet 0   Azelastine-Fluticasone 137-50 MCG/ACT SUSP Place 1 spray into the nose every 12 (twelve) hours. 23 g 5   ELIQUIS 5 MG TABS tablet TAKE 1 TABLET BY MOUTH TWICE A DAY 180 tablet 1   finasteride (PROSCAR) 5 MG tablet Take 1 tablet (5 mg total) by mouth daily. 90 tablet 1   fluticasone (FLONASE) 50 MCG/ACT nasal spray Place 2 sprays into both nostrils daily. Use in addition to azelastine. 16 g 6   Inulin (FIBER CHOICE PO) Take 3 tablets by mouth daily as needed (Constipation).     ketoconazole (NIZORAL) 2 % shampoo Apply topically 3 (three) times a week.     nystatin cream (MYCOSTATIN) Apply topically.     potassium chloride (KLOR-CON M) 10 MEQ tablet Take 10 mEq by mouth daily as needed (take with furosemide).     tamsulosin (FLOMAX) 0.4 MG CAPS capsule  TAKE 1 CAPSULE BY MOUTH EVERY DAY 90 capsule 1   torsemide (DEMADEX) 20 MG tablet TAKE 1 TABLET BY MOUTH EVERY DAY 90 tablet 1   triamcinolone cream (KENALOG) 0.1 % SMARTSIG:1 Application Topical 2-3 Times Daily     valsartan (DIOVAN) 320 MG tablet TAKE 1 TABLET BY MOUTH EVERY DAY 90 tablet 1   zolpidem (AMBIEN) 10 MG tablet Take 1 tablet (10 mg total) by mouth at bedtime. 30 tablet 3   No current facility-administered medications on file prior to visit.   Past Medical History:  Diagnosis Date   A-fib ([place])    Acquired thrombophilia ([place]) 11/17/2019   Acute bronchitis due to other specified organisms 10/28/2021   Acute cough 10/20/2021   Acute non-recurrent maxillary sinusitis 10/28/2021   Anticoagulated 01/21/2015   Atherosclerotic heart disease of native coronary artery without angina pectoris    Atrial fibrillation with rapid ventricular response ([place]) 11/12/2021   Atrial flutter with rapid ventricular response ([place]) 11/12/2021   CAD in native artery 01/21/2015   Cancer (HCC)    skin cancer   CHF (congestive heart failure) ([place]) 11/05/2019   Chronic diastolic (congestive) heart failure (HCC)    Chronic diastolic heart failure ([place]) 01/21/2015   Last Assessment & Plan:  Is congestive heart failure is mildly decompensated he is edematous he'll increase the dose of his diuretic today the lab  work performed including an ARB referred to care transitions for home care he'll follow-up in my office with me in 4-6 weeks daily weights sodium restrictionand compliance of medications beinghemoglobin of will   Chronic obstructive pulmonary disease (COPD) ([place])    Coronary artery disease involving autologous artery coronary bypass graft 11/12/2021   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 11/17/2019   Dizziness 11/12/2021   Dyslipidemia 11/12/2021   Elevated brain natriuretic peptide (BNP) level 11/12/2021   Essential hypertension 01/21/2015   right superior quadrantanopsia    Hearing loss of right ear due to cerumen impaction 10/20/2021   High cholesterol 10/20/2017   Hx of CABG 03/02/2017   Hypertension    Hypertensive heart disease with heart failure ([place])    Hypothyroidism (acquired) 04/24/2018   Impaired fasting glucose    Left rotator cuff tear 11/12/2021   Mixed hyperlipidemia 11/17/2019   Myocardial infarction ([place])    Non-seasonal allergic rhinitis due to pollen 10/20/2021   On amiodarone therapy 01/21/2015   PAF (paroxysmal atrial fibrillation) ([place])    Primary insomnia    Sequela of cardioembolic stroke 59/93/5701   Severe obesity with body mass index (BMI) of 36.0 to 36.9 with serious comorbidity ([place])    Squamous cell carcinoma 11/05/2019   Typical atrial flutter ([place]) 01/21/2015   Vitamin D deficiency, unspecified    Past Surgical History:  Procedure Laterality Date   APPENDECTOMY  1956   CARDIAC CATHETERIZATION     CARDIOVERSION  2002   CHOLECYSTECTOMY  2006   CORONARY ARTERY BYPASS GRAFT  2001   HERNIA REPAIR     RADIOLOGY WITH ANESTHESIA Bilateral 01/18/2018   Procedure: MRI WITH ANESTHESIA LUMBAR SPINE WITH AND WITHOUT CONTRAST;  Surgeon: Radiologist, Medication, MD;  Location: [facility];  Service: Radiology;  Laterality: Bilateral;   RADIOLOGY WITH ANESTHESIA N/A 04/19/2022   Procedure: MRI WITH BRAIN WITH AND WITHOUT CONTRAST;  Surgeon: Radiologist, Medication, MD;  Location: [facility];  Service: Radiology;  Laterality: N/A;   ROTATOR CUFF REPAIR Left    TONSILLECTOMY  1975    Family History  Problem Relation Age of Onset   Heart disease Father    Arrhythmia Father    Breast cancer Mother    Diabetes type II Sister    Diabetes Brother    Social History   Socioeconomic History   Marital status: Married    Spouse name: Manuela Schwartz   Number of children: 2   Years of education: Not on file   Highest education level: Not on file  Occupational History   Occupation: Retired  Tobacco Use   Smoking status: Former    Packs/day: 1.00     Years: 15.00    Total pack years: 15.00    Types: Cigarettes    Quit date: 08/29/1978    Years since quitting: 44.0    Passive exposure: Past   Smokeless tobacco: Never  Vaping Use   Vaping Use: Never used  Substance and Sexual Activity   Alcohol use: No    Alcohol/week: 0.0 standard drinks of alcohol   Drug use: No   Sexual activity: Not Currently  Other Topics Concern   Not on file  Social History Narrative   Not on file   Social Determinants of Health   Financial Resource Strain: High Risk (01/28/2022)   Overall Financial Resource Strain (CARDIA)    Difficulty of Paying Living Expenses: Hard  Food Insecurity: No Food Insecurity (12/07/2020)   Hunger Vital Sign    Worried About Running  Out of Food in the Last Year: Never true    Ran Out of Food in the Last Year: Never true  Transportation Needs: No Transportation Needs (09/09/2022)   PRAPARE - Hydrologist (Medical): No    Lack of Transportation (Non-Medical): No  Physical Activity: Not on file  Stress: Not on file  Social Connections: Not on file    Review of Systems  Constitutional:  Negative for chills and fever.  HENT:  Negative for congestion, rhinorrhea and sore throat.   Respiratory:  Negative for cough and shortness of breath.   Cardiovascular:  Negative for chest pain and palpitations.  Gastrointestinal:  Negative for abdominal pain, constipation, diarrhea, nausea and vomiting.  Genitourinary:  Negative for dysuria and urgency.  Musculoskeletal:  Negative for arthralgias, back pain and myalgias.  Neurological:  Positive for weakness (both legs). Negative for dizziness and headaches.  Psychiatric/Behavioral:  Negative for dysphoric mood. The patient is not nervous/anxious.      Objective:  BP (!) 128/58   Pulse (!) 56   Temp (!) 97.4 F (36.3 C)   Resp 16   Ht '5\' 10"'$  (1.778 m)   Wt 249 lb (112.9 kg)   BMI 35.73 kg/m      09/12/2022    9:48 AM 08/18/2022    1:27 PM  07/05/2022    3:47 PM  BP/Weight  Systolic BP 702 637 858  Diastolic BP 58 70 74  Wt. (Lbs) 249 247 244  BMI 35.73 kg/m2 35.44 kg/m2 35.01 kg/m2    Physical Exam Vitals reviewed.  Constitutional:      Appearance: Normal appearance. He is obese.  Neck:     Vascular: No carotid bruit.  Cardiovascular:     Rate and Rhythm: Normal rate and regular rhythm.     Pulses: Normal pulses.     Heart sounds: Normal heart sounds.  Pulmonary:     Effort: Pulmonary effort is normal.     Breath sounds: Normal breath sounds. No wheezing, rhonchi or rales.  Abdominal:     General: Bowel sounds are normal.     Palpations: Abdomen is soft.     Tenderness: There is no abdominal tenderness.  Neurological:     Mental Status: He is alert.  Psychiatric:        Mood and Affect: Mood normal.        Behavior: Behavior normal.     Diabetic Foot Exam - Simple   No data filed      Lab Results  Component Value Date   WBC 5.3 09/12/2022   HGB 14.1 09/12/2022   HCT 41.2 09/12/2022   PLT 190 09/12/2022   GLUCOSE 91 09/12/2022   CHOL 150 09/12/2022   TRIG 142 09/12/2022   HDL 40 09/12/2022   LDLCALC 85 09/12/2022   ALT 19 09/12/2022   AST 23 09/12/2022   NA 141 09/12/2022   K 4.1 09/12/2022   CL 100 09/12/2022   CREATININE 1.53 (H) 09/12/2022   BUN 17 09/12/2022   CO2 24 09/12/2022   TSH 5.490 (H) 09/12/2022   INR 1.1 02/02/2022   HGBA1C 5.4 06/03/2022   MICROALBUR Positive 150 01/23/2019      Assessment & Plan:   Persistent atrial fibrillation ([place]) Management per specialist. Taking Eliquis 5 mg twice a day, Amiodarone 200 mg daily.   Hypertensive heart disease with heart failure ([place]) Well controlled.  No changes to medicines. Takes Amlodipine 5 mg daily and Valsartan 1 tablet  320 mg daily. Continue to work on eating a healthy diet and exercise.  Labs drawn today.   Management per specialist.  Cardiology  Chronic diastolic (congestive) heart failure ([place]) Management per  specialist.  Atherosclerotic heart disease of native coronary artery without angina pectoris Management per specialist.  Benign prostatic hyperplasia with urinary frequency The current medical regimen is effective;  continue present plan and medications.  Tamsulosin 0.4 mg daily, Finasteride 5 mg daily.    Mixed hyperlipidemia Well controlled.  No changes to medicines. Atorvastatin 20 mg every day  Continue to work on eating a healthy diet and exercise.  Labs drawn today.    Impaired fasting glucose Recommend continue to work on eating healthy diet and exercise.   Primary insomnia Continue ambien 10 mg before bed.   Acquired hypothyroidism Previously well controlled Continue Synthroid at current dose  Recheck TSH and adjust Synthroid as indicated    Acquired thrombophilia ([place]) Due to eliquis which is necessary for atrial fibrillation.    Meds ordered this encounter  Medications   Vitamin D, Ergocalciferol, (DRISDOL) 1.25 MG (50000 UNIT) CAPS capsule    Sig: Take 1 capsule (50,000 Units total) by mouth every 7 (seven) days.    Dispense:  12 capsule    Refill:  2   nitroGLYCERIN (NITROSTAT) 0.4 MG SL tablet    Sig: Place 1 tablet (0.4 mg total) under the tongue every 5 (five) minutes as needed for chest pain.    Dispense:  25 tablet    Refill:  2    Orders Placed This Encounter  Procedures   Comprehensive metabolic panel   Lipid panel   CBC with Differential/Platelet   TSH   HCV Ab w Reflex to Quant PCR   Interpretation:   Cardiovascular Risk Assessment     Follow-up: Return in about 4 months (around 01/11/2023) for chronic follow up.  An After Visit Summary was printed and given to the patient.  Thompson Caul, CMA, acting as a scribe for [name], MD.,have documented all relevant documentation on the behalf of [name], MD,as directed by  [name], MD while in the presence of [name], MD.   [name], MD [place] [phone]

## 2022-09-12 ENCOUNTER — Ambulatory Visit (INDEPENDENT_AMBULATORY_CARE_PROVIDER_SITE_OTHER): Payer: Medicare Other | Admitting: Family Medicine

## 2022-09-12 VITALS — BP 128/58 | HR 56 | Temp 97.4°F | Resp 16 | Ht 70.0 in | Wt 249.0 lb

## 2022-09-12 DIAGNOSIS — I4819 Other persistent atrial fibrillation: Secondary | ICD-10-CM

## 2022-09-12 DIAGNOSIS — D6869 Other thrombophilia: Secondary | ICD-10-CM

## 2022-09-12 DIAGNOSIS — I110 Hypertensive heart disease with heart failure: Secondary | ICD-10-CM | POA: Diagnosis not present

## 2022-09-12 DIAGNOSIS — F5101 Primary insomnia: Secondary | ICD-10-CM

## 2022-09-12 DIAGNOSIS — R7301 Impaired fasting glucose: Secondary | ICD-10-CM

## 2022-09-12 DIAGNOSIS — Z1159 Encounter for screening for other viral diseases: Secondary | ICD-10-CM

## 2022-09-12 DIAGNOSIS — N401 Enlarged prostate with lower urinary tract symptoms: Secondary | ICD-10-CM

## 2022-09-12 DIAGNOSIS — E782 Mixed hyperlipidemia: Secondary | ICD-10-CM | POA: Diagnosis not present

## 2022-09-12 DIAGNOSIS — I5032 Chronic diastolic (congestive) heart failure: Secondary | ICD-10-CM

## 2022-09-12 DIAGNOSIS — R350 Frequency of micturition: Secondary | ICD-10-CM

## 2022-09-12 DIAGNOSIS — I2510 Atherosclerotic heart disease of native coronary artery without angina pectoris: Secondary | ICD-10-CM | POA: Diagnosis not present

## 2022-09-12 DIAGNOSIS — E039 Hypothyroidism, unspecified: Secondary | ICD-10-CM

## 2022-09-12 DIAGNOSIS — Z6835 Body mass index (BMI) 35.0-35.9, adult: Secondary | ICD-10-CM

## 2022-09-12 MED ORDER — VITAMIN D (ERGOCALCIFEROL) 1.25 MG (50000 UNIT) PO CAPS: 50000.0000 [IU] | ORAL_CAPSULE | ORAL | 2 refills | Status: DC

## 2022-09-12 MED ORDER — NITROGLYCERIN 0.4 MG SL SUBL: 0.4000 mg | SUBLINGUAL_TABLET | SUBLINGUAL | 2 refills | Status: DC | PRN

## 2022-09-12 NOTE — Patient Instructions (Signed)
Eating a low fat and low salt diet and exercising are the best things you can do to be healthier! Good luck with cardiopulmonary rehab.  Happy New Year!  Dr. Tobie Poet

## 2022-09-13 LAB — COMPREHENSIVE METABOLIC PANEL
ALT: 19 IU/L (ref 0–44)
AST: 23 IU/L (ref 0–40)
Albumin/Globulin Ratio: 1.8 (ref 1.2–2.2)
Albumin: 4.5 g/dL (ref 3.8–4.8)
Alkaline Phosphatase: 109 IU/L (ref 44–121)
BUN/Creatinine Ratio: 11 (ref 10–24)
BUN: 17 mg/dL (ref 8–27)
Bilirubin Total: 0.8 mg/dL (ref 0.0–1.2)
CO2: 24 mmol/L (ref 20–29)
Calcium: 9.5 mg/dL (ref 8.6–10.2)
Chloride: 100 mmol/L (ref 96–106)
Creatinine, Ser: 1.53 mg/dL — ABNORMAL HIGH (ref 0.76–1.27)
Globulin, Total: 2.5 g/dL (ref 1.5–4.5)
Glucose: 91 mg/dL (ref 70–99)
Potassium: 4.1 mmol/L (ref 3.5–5.2)
Sodium: 141 mmol/L (ref 134–144)
Total Protein: 7 g/dL (ref 6.0–8.5)
eGFR: 47 mL/min/{1.73_m2} — ABNORMAL LOW (ref >59)

## 2022-09-13 LAB — CBC WITH DIFFERENTIAL/PLATELET
Basophils Absolute: 0 10*3/uL (ref 0.0–0.2)
Basos: 1 %
EOS (ABSOLUTE): 0.3 10*3/uL (ref 0.0–0.4)
Eos: 5 %
Hematocrit: 41.2 % (ref 37.5–51.0)
Hemoglobin: 14.1 g/dL (ref 13.0–17.7)
Immature Grans (Abs): 0 10*3/uL (ref 0.0–0.1)
Immature Granulocytes: 1 %
Lymphocytes Absolute: 1.5 10*3/uL (ref 0.7–3.1)
Lymphs: 29 %
MCH: 31.1 pg (ref 26.6–33.0)
MCHC: 34.2 g/dL (ref 31.5–35.7)
MCV: 91 fL (ref 79–97)
Monocytes Absolute: 0.6 10*3/uL (ref 0.1–0.9)
Monocytes: 12 %
Neutrophils Absolute: 2.7 10*3/uL (ref 1.4–7.0)
Neutrophils: 52 %
Platelets: 190 10*3/uL (ref 150–450)
RBC: 4.53 x10E6/uL (ref 4.14–5.80)
RDW: 12.2 % (ref 11.6–15.4)
WBC: 5.3 10*3/uL (ref 3.4–10.8)

## 2022-09-13 LAB — LIPID PANEL
Chol/HDL Ratio: 3.8 ratio (ref 0.0–5.0)
Cholesterol, Total: 150 mg/dL (ref 100–199)
HDL: 40 mg/dL (ref >39)
LDL Chol Calc (NIH): 85 mg/dL (ref 0–99)
Triglycerides: 142 mg/dL (ref 0–149)
VLDL Cholesterol Cal: 25 mg/dL (ref 5–40)

## 2022-09-13 LAB — CARDIOVASCULAR RISK ASSESSMENT

## 2022-09-13 LAB — HCV AB W REFLEX TO QUANT PCR: HCV Ab: NONREACTIVE

## 2022-09-13 LAB — TSH: TSH: 5.49 u[IU]/mL — ABNORMAL HIGH (ref 0.450–4.500)

## 2022-09-13 LAB — HCV INTERPRETATION

## 2022-09-14 NOTE — Progress Notes (Signed)
Blood count normal.  Liver function normal.  Kidney function abnormal. Worsened. Recommend hydrate. Rechck cmp in 2 weeks.  Thyroid function abnormal. Levothyroxine 112 mcg once daily in am. Recheck in 3 months Cholesterol: good Hep C negative.

## 2022-09-15 ENCOUNTER — Ambulatory Visit (HOSPITAL_COMMUNITY)
Admission: RE | Admit: 2022-09-15 | Discharge: 2022-09-15 | Disposition: A | Discharge status: Home / Self Care | Payer: Medicare Other | Source: Ambulatory Visit | Attending: Surgery | Admitting: Surgery

## 2022-09-15 ENCOUNTER — Other Ambulatory Visit: Payer: Self-pay

## 2022-09-15 DIAGNOSIS — D485 Neoplasm of uncertain behavior of skin: Secondary | ICD-10-CM | POA: Diagnosis not present

## 2022-09-15 DIAGNOSIS — I6523 Occlusion and stenosis of bilateral carotid arteries: Secondary | ICD-10-CM | POA: Diagnosis not present

## 2022-09-15 DIAGNOSIS — F5101 Primary insomnia: Secondary | ICD-10-CM

## 2022-09-15 DIAGNOSIS — N289 Disorder of kidney and ureter, unspecified: Secondary | ICD-10-CM

## 2022-09-15 DIAGNOSIS — M47812 Spondylosis without myelopathy or radiculopathy, cervical region: Secondary | ICD-10-CM | POA: Diagnosis not present

## 2022-09-15 DIAGNOSIS — L570 Actinic keratosis: Secondary | ICD-10-CM | POA: Diagnosis not present

## 2022-09-15 DIAGNOSIS — D044 Carcinoma in situ of skin of scalp and neck: Secondary | ICD-10-CM | POA: Diagnosis not present

## 2022-09-15 DIAGNOSIS — Z9889 Other specified postprocedural states: Secondary | ICD-10-CM | POA: Diagnosis not present

## 2022-09-15 MED ORDER — LEVOTHYROXINE SODIUM 112 MCG PO TABS: 112.0000 ug | ORAL_TABLET | Freq: Every day | ORAL | 0 refills | Status: DC

## 2022-09-15 MED ORDER — IOHEXOL 350 MG/ML SOLN
75.0000 mL | Freq: Once | INTRAVENOUS | Status: AC | PRN
  Administered 2022-09-15: 75 mL via INTRAVENOUS

## 2022-09-18 ENCOUNTER — Encounter: Payer: Self-pay | Admitting: Family Medicine

## 2022-09-18 DIAGNOSIS — Z1159 Encounter for screening for other viral diseases: Secondary | ICD-10-CM | POA: Insufficient documentation

## 2022-09-18 NOTE — Assessment & Plan Note (Signed)
Previously well controlled Continue Synthroid at current dose  Recheck TSH and adjust Synthroid as indicated   

## 2022-09-18 NOTE — Assessment & Plan Note (Signed)
Management per specialist. 

## 2022-09-18 NOTE — Assessment & Plan Note (Addendum)
Well controlled.  No changes to medicines. Takes Amlodipine 5 mg daily and Valsartan 1 tablet 320 mg daily. Continue to work on eating a healthy diet and exercise.  Labs drawn today.   Management per specialist.  Cardiology

## 2022-09-18 NOTE — Assessment & Plan Note (Signed)
The current medical regimen is effective;  continue present plan and medications.  Tamsulosin 0.4 mg daily, Finasteride 5 mg daily.

## 2022-09-18 NOTE — Assessment & Plan Note (Signed)
Well controlled.  No changes to medicines. Atorvastatin 20 mg every day  Continue to work on eating a healthy diet and exercise.  Labs drawn today.

## 2022-09-18 NOTE — Assessment & Plan Note (Signed)
Continue ambien 10 mg before bed.

## 2022-09-18 NOTE — Assessment & Plan Note (Signed)
Recommend continue to work on eating healthy diet and exercise.  

## 2022-09-18 NOTE — Assessment & Plan Note (Signed)
Due to eliquis which is necessary for atrial fibrillation.

## 2022-09-18 NOTE — Assessment & Plan Note (Addendum)
Management per specialist. Taking Eliquis 5 mg twice a day, Amiodarone 200 mg daily.

## 2022-09-19 ENCOUNTER — Ambulatory Visit (INDEPENDENT_AMBULATORY_CARE_PROVIDER_SITE_OTHER): Payer: Medicare Other | Admitting: Surgery

## 2022-09-19 ENCOUNTER — Encounter: Payer: Self-pay | Admitting: Surgery

## 2022-09-19 VITALS — BP 117/67 | HR 57 | Temp 98.2°F | Resp 20 | Ht 70.0 in | Wt 251.0 lb

## 2022-09-19 DIAGNOSIS — I6523 Occlusion and stenosis of bilateral carotid arteries: Secondary | ICD-10-CM | POA: Diagnosis not present

## 2022-09-19 NOTE — Progress Notes (Signed)
[facility]  Patient name: John Kemp MRN: [id number] DOB: Mar 02, 1946 Sex: male   REASON FOR VISIT:    Follow up  HISOTRY OF PRESENT ILLNESS:    John Kemp is a 77 y.o. male who I initially saw in 2023 for evaluation of carotid stenosis in the setting of balance issues.  He had had several episodes where he was off balance for few minutes.  He had undergone a Holter monitor evaluation which was unremarkable.  His echo showed mild systolic dysfunction.  Carotid duplex showed 50-69% stenosis bilaterally.  He was also having ringing in the ear.  He had had multiple episodes over the prior 2 months.  His episodes were associated with emesis as well as sweating.  He comes in today for CT scan follow-up of his carotid disease.  He was also seen by ENT and felt that this was a viral issue.  His symptoms have now completely resolved.  The patient is medically managed for hypertension.  He suffers from A-fib for which she takes Eliquis.  He suffers from COPD secondary to history of tobacco abuse.  He takes a statin for hypercholesterolemia.  PAST MEDICAL HISTORY:   Past Medical History:  Diagnosis Date   A-fib ([place])    Acquired thrombophilia ([place]) 11/17/2019   Acute bronchitis due to other specified organisms 10/28/2021   Acute cough 10/20/2021   Acute non-recurrent maxillary sinusitis 10/28/2021   Anticoagulated 01/21/2015   Atherosclerotic heart disease of native coronary artery without angina pectoris    Atrial fibrillation with rapid ventricular response ([place]) 11/12/2021   Atrial flutter with rapid ventricular response ([place]) 11/12/2021   CAD in native artery 01/21/2015   Cancer (HCC)    skin cancer   CHF (congestive heart failure) ([place]) 11/05/2019   Chronic diastolic (congestive) heart failure (HCC)    Chronic diastolic heart failure ([place]) 01/21/2015   Last Assessment & Plan:  Is congestive heart failure is  mildly decompensated he is edematous he'll increase the dose of his diuretic today the lab work performed including an ARB referred to care transitions for home care he'll follow-up in my office with me in 4-6 weeks daily weights sodium restrictionand compliance of medications beinghemoglobin of will   Chronic obstructive pulmonary disease (COPD) ([place])    Coronary artery disease involving autologous artery coronary bypass graft 11/12/2021   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 11/17/2019   Dizziness 11/12/2021   Dyslipidemia 11/12/2021   Elevated brain natriuretic peptide (BNP) level 11/12/2021   Essential hypertension 01/21/2015   right superior quadrantanopsia   Hearing loss of right ear due to cerumen impaction 10/20/2021   High cholesterol 10/20/2017   Hx of CABG 03/02/2017   Hypertension    Hypertensive heart disease with heart failure ([place])    Hypothyroidism (acquired) 04/24/2018   Impaired fasting glucose    Left rotator cuff tear 11/12/2021   Mixed hyperlipidemia 11/17/2019   Myocardial infarction ([place])    Non-seasonal allergic rhinitis due to pollen 10/20/2021   On amiodarone therapy 01/21/2015   PAF (paroxysmal atrial fibrillation) (HCC)    Primary insomnia    Sequela of cardioembolic stroke 28/36/6294   Severe obesity with body mass index (BMI) of 36.0 to 36.9 with serious comorbidity ([place])    Squamous cell carcinoma 11/05/2019   Typical atrial flutter ([place]) 01/21/2015   Vitamin D deficiency, unspecified      FAMILY HISTORY:   Family History  Problem Relation Age of Onset  Heart disease Father    Arrhythmia Father    Breast cancer Mother    Diabetes type II Sister    Diabetes Brother     SOCIAL HISTORY:   Social History   Tobacco Use   Smoking status: Former    Packs/day: 1.00    Years: 15.00    Total pack years: 15.00    Types: Cigarettes    Quit date: 08/29/1978    Years since quitting: 44.0    Passive exposure: Past   Smokeless  tobacco: Never  Substance Use Topics   Alcohol use: No    Alcohol/week: 0.0 standard drinks of alcohol     ALLERGIES:   Allergies  Allergen Reactions   Avelox [Moxifloxacin Hcl] Other (See Comments)    Unknown   Cartia Xt [Diltiazem Hcl] Nausea And Vomiting   Pravachol [Pravastatin Sodium] Other (See Comments)    myalgia   Prednisone Other (See Comments)    "makes me feel terrible"   Zetia [Ezetimibe] Other (See Comments)    myalgias     CURRENT MEDICATIONS:   Current Outpatient Medications  Medication Sig Dispense Refill   acetaminophen (TYLENOL) 500 MG tablet Take 500 mg by mouth 2 (two) times daily.     amiodarone (PACERONE) 200 MG tablet Take 1 tablet (200 mg total) by mouth daily. 90 tablet 2   amLODipine (NORVASC) 5 MG tablet Take 1 tablet (5 mg total) by mouth daily. 90 tablet 1   atorvastatin (LIPITOR) 20 MG tablet TAKE 1 TABLET BY MOUTH EVERY DAY 90 tablet 0   Azelastine-Fluticasone 137-50 MCG/ACT SUSP Place 1 spray into the nose every 12 (twelve) hours. 23 g 5   ELIQUIS 5 MG TABS tablet TAKE 1 TABLET BY MOUTH TWICE A DAY 180 tablet 1   finasteride (PROSCAR) 5 MG tablet Take 1 tablet (5 mg total) by mouth daily. 90 tablet 1   fluticasone (FLONASE) 50 MCG/ACT nasal spray Place 2 sprays into both nostrils daily. Use in addition to azelastine. 16 g 6   Inulin (FIBER CHOICE PO) Take 3 tablets by mouth daily as needed (Constipation).     ketoconazole (NIZORAL) 2 % shampoo Apply topically 3 (three) times a week.     levothyroxine (SYNTHROID) 112 MCG tablet Take 1 tablet (112 mcg total) by mouth daily. 90 tablet 0   nitroGLYCERIN (NITROSTAT) 0.4 MG SL tablet Place 1 tablet (0.4 mg total) under the tongue every 5 (five) minutes as needed for chest pain. 25 tablet 2   nystatin cream (MYCOSTATIN) Apply topically.     potassium chloride (KLOR-CON M) 10 MEQ tablet Take 10 mEq by mouth daily as needed (take with furosemide).     tamsulosin (FLOMAX) 0.4 MG CAPS capsule TAKE 1  CAPSULE BY MOUTH EVERY DAY 90 capsule 1   torsemide (DEMADEX) 20 MG tablet TAKE 1 TABLET BY MOUTH EVERY DAY 90 tablet 1   triamcinolone cream (KENALOG) 0.1 % SMARTSIG:1 Application Topical 2-3 Times Daily     valsartan (DIOVAN) 320 MG tablet TAKE 1 TABLET BY MOUTH EVERY DAY 90 tablet 1   Vitamin D, Ergocalciferol, (DRISDOL) 1.25 MG (50000 UNIT) CAPS capsule Take 1 capsule (50,000 Units total) by mouth every 7 (seven) days. 12 capsule 2   zolpidem (AMBIEN) 10 MG tablet Take 1 tablet (10 mg total) by mouth at bedtime. 30 tablet 3   No current facility-administered medications for this visit.    REVIEW OF SYSTEMS:   '[X]'$  denotes positive finding, '[ ]'$  denotes negative finding Cardiac  Comments:  Chest pain or chest pressure:    Shortness of breath upon exertion:    Short of breath when lying flat:    Irregular heart rhythm:        Vascular    Pain in calf, thigh, or hip brought on by ambulation:    Pain in feet at night that wakes you up from your sleep:     Blood clot in your veins:    Leg swelling:         Pulmonary    Oxygen at home:    Productive cough:     Wheezing:         Neurologic    Sudden weakness in arms or legs:     Sudden numbness in arms or legs:     Sudden onset of difficulty speaking or slurred speech:    Temporary loss of vision in one eye:     Problems with dizziness:         Gastrointestinal    Blood in stool:     Vomited blood:         Genitourinary    Burning when urinating:     Blood in urine:        Psychiatric    Major depression:         Hematologic    Bleeding problems:    Problems with blood clotting too easily:        Skin    Rashes or ulcers:        Constitutional    Fever or chills:      PHYSICAL EXAM:   Vitals:   09/19/22 1112 09/19/22 1114  BP: 133/69 117/67  Pulse: (!) 57   Resp: 20   Temp: 98.2 F (36.8 C)   SpO2: 95%   Weight: 251 lb (113.9 kg)   Height: '5\' 10"'$  (1.778 m)     GENERAL: The patient is a  well-nourished male, in no acute distress. The vital signs are documented above. CARDIAC: There is a regular rate and rhythm.  VASCULAR: No carotid bruits PULMONARY: Non-labored respirations MUSCULOSKELETAL: There are no major deformities or cyanosis. NEUROLOGIC: No focal weakness or paresthesias are detected. SKIN: There are no ulcers or rashes noted. PSYCHIATRIC: The patient has a normal affect.  STUDIES:   I have reviewed the following CTA:  1. 50% stenosis in the proximal right ICA, which has progressed from the prior exam. 2. Severe stenosis at the origins of the bilateral ECAs, similar to prior. 3. Aortic atherosclerosis. Right carotid system: 50% stenosis in the proximal right ICA (series 7, image 122), which has progressed from the prior exam. Severe stenosis at the origin of the right ECA, similar to prior. No evidence of dissection or occlusion.   Left carotid system: No hemodynamically significant stenosis (greater than 50%) in the proximal left ICA. Severe stenosis at the origin of the left ECA, similar to prior. No evidence of dissection or occlusion.   Vertebral arteries: Left dominant system. No evidence of dissection, occlusion, or hemodynamically significant stenosis (greater than 50%). MEDICAL ISSUES:   Carotid stenosis: The patient's symptoms have completely resolved and are attributed to a viral inner ear infection.  CT scan today shows 50% or less bilateral carotid stenosis.  The patient is asymptomatic.  I will have him follow-up in 1 year with repeat ultrasound.  He is on a statin and is anticoagulated for his A-fib.    Leia Alf, MD, FACS [facility] Tel [phone] Pager [phone]

## 2022-09-23 DIAGNOSIS — J324 Chronic pansinusitis: Secondary | ICD-10-CM | POA: Diagnosis not present

## 2022-09-23 DIAGNOSIS — R051 Acute cough: Secondary | ICD-10-CM | POA: Diagnosis not present

## 2022-09-23 DIAGNOSIS — R0981 Nasal congestion: Secondary | ICD-10-CM | POA: Diagnosis not present

## 2022-09-29 ENCOUNTER — Other Ambulatory Visit: Payer: Medicare Other

## 2022-09-29 ENCOUNTER — Telehealth: Payer: Self-pay

## 2022-09-29 DIAGNOSIS — R051 Acute cough: Secondary | ICD-10-CM

## 2022-09-29 DIAGNOSIS — N289 Disorder of kidney and ureter, unspecified: Secondary | ICD-10-CM | POA: Diagnosis not present

## 2022-09-29 NOTE — Telephone Encounter (Signed)
Patient called and stated that he is suppose to come in for labs today, was seen at urgent care last week for URI and was put on antibiotics and is not getting better. He wanted to be test for COVID before coming in to give blood.  Per Dr. Tobie Poet: Patient can be test before coming in to building.  Patient made aware.

## 2022-09-30 LAB — COMPREHENSIVE METABOLIC PANEL
ALT: 16 IU/L (ref 0–44)
AST: 19 IU/L (ref 0–40)
Albumin/Globulin Ratio: 1.8 (ref 1.2–2.2)
Albumin: 4.3 g/dL (ref 3.8–4.8)
Alkaline Phosphatase: 94 IU/L (ref 44–121)
BUN/Creatinine Ratio: 15 (ref 10–24)
BUN: 21 mg/dL (ref 8–27)
Bilirubin Total: 0.8 mg/dL (ref 0.0–1.2)
CO2: 25 mmol/L (ref 20–29)
Calcium: 9 mg/dL (ref 8.6–10.2)
Chloride: 101 mmol/L (ref 96–106)
Creatinine, Ser: 1.39 mg/dL — ABNORMAL HIGH (ref 0.76–1.27)
Globulin, Total: 2.4 g/dL (ref 1.5–4.5)
Glucose: 99 mg/dL (ref 70–99)
Potassium: 3.8 mmol/L (ref 3.5–5.2)
Sodium: 142 mmol/L (ref 134–144)
Total Protein: 6.7 g/dL (ref 6.0–8.5)
eGFR: 53 mL/min/{1.73_m2} — ABNORMAL LOW (ref >59)

## 2022-10-03 ENCOUNTER — Other Ambulatory Visit: Payer: Self-pay

## 2022-10-03 DIAGNOSIS — I5032 Chronic diastolic (congestive) heart failure: Secondary | ICD-10-CM | POA: Diagnosis not present

## 2022-10-03 DIAGNOSIS — I4891 Unspecified atrial fibrillation: Secondary | ICD-10-CM | POA: Diagnosis not present

## 2022-10-03 DIAGNOSIS — Z951 Presence of aortocoronary bypass graft: Secondary | ICD-10-CM | POA: Diagnosis not present

## 2022-10-03 DIAGNOSIS — E785 Hyperlipidemia, unspecified: Secondary | ICD-10-CM | POA: Diagnosis not present

## 2022-10-03 DIAGNOSIS — I252 Old myocardial infarction: Secondary | ICD-10-CM | POA: Diagnosis not present

## 2022-10-03 MED ORDER — KERENDIA 10 MG PO TABS: 10.0000 mg | ORAL_TABLET | Freq: Every day | ORAL | 2 refills | Status: DC

## 2022-10-05 ENCOUNTER — Telehealth: Payer: Self-pay

## 2022-10-05 NOTE — Telephone Encounter (Signed)
Patient came into the office this morning stated, he went to go pick up kerendia from the pharmacy and it was 800 dollars, he will not bee about to afford medication at this time. Please advise

## 2022-10-06 ENCOUNTER — Telehealth: Payer: Self-pay

## 2022-10-06 NOTE — Telephone Encounter (Signed)
His wife has aneurism, going to Dr. On Monday. He's very frustrated and worried.  Told patient we'd start PAP for him for [facility] ASAP

## 2022-10-06 NOTE — Progress Notes (Cosign Needed)
10-06-2022: Completed/uploaded kerendia application to mail.  [place] Pharmacist Assistant [phone]

## 2022-10-06 NOTE — Telephone Encounter (Signed)
Patient alread in CCM. Reach out to [facility] to see if he can help patient with getting Lance Sell

## 2022-10-11 DIAGNOSIS — Z7901 Long term (current) use of anticoagulants: Secondary | ICD-10-CM | POA: Diagnosis not present

## 2022-10-11 DIAGNOSIS — J343 Hypertrophy of nasal turbinates: Secondary | ICD-10-CM | POA: Diagnosis not present

## 2022-10-11 DIAGNOSIS — J342 Deviated nasal septum: Secondary | ICD-10-CM

## 2022-10-11 HISTORY — DX: Long term (current) use of anticoagulants: Z79.01

## 2022-10-11 HISTORY — DX: Hypertrophy of nasal turbinates: J34.3

## 2022-10-11 HISTORY — DX: Deviated nasal septum: J34.2

## 2022-11-01 ENCOUNTER — Other Ambulatory Visit: Payer: Self-pay | Admitting: Family Medicine

## 2022-11-03 ENCOUNTER — Encounter: Payer: Self-pay | Admitting: Cardiology

## 2022-11-03 ENCOUNTER — Ambulatory Visit: Payer: Medicare Other | Attending: Cardiology | Admitting: Cardiology

## 2022-11-03 VITALS — BP 114/64 | HR 64 | Ht 70.0 in | Wt 245.0 lb

## 2022-11-03 DIAGNOSIS — I480 Paroxysmal atrial fibrillation: Secondary | ICD-10-CM | POA: Insufficient documentation

## 2022-11-03 DIAGNOSIS — R001 Bradycardia, unspecified: Secondary | ICD-10-CM | POA: Diagnosis not present

## 2022-11-03 DIAGNOSIS — I110 Hypertensive heart disease with heart failure: Secondary | ICD-10-CM | POA: Insufficient documentation

## 2022-11-03 DIAGNOSIS — Z7901 Long term (current) use of anticoagulants: Secondary | ICD-10-CM | POA: Diagnosis not present

## 2022-11-03 DIAGNOSIS — I2510 Atherosclerotic heart disease of native coronary artery without angina pectoris: Secondary | ICD-10-CM | POA: Insufficient documentation

## 2022-11-03 DIAGNOSIS — E7800 Pure hypercholesterolemia, unspecified: Secondary | ICD-10-CM | POA: Diagnosis not present

## 2022-11-03 DIAGNOSIS — I5032 Chronic diastolic (congestive) heart failure: Secondary | ICD-10-CM | POA: Insufficient documentation

## 2022-11-03 DIAGNOSIS — Z79899 Other long term (current) drug therapy: Secondary | ICD-10-CM | POA: Diagnosis not present

## 2022-11-03 NOTE — Progress Notes (Signed)
Cardiology Office Note:    Date:  11/03/2022   ID:  John Kemp, DOB 07-09-1946, MRN [id number]  PCP:  [name], MD  Cardiologist:  Shirlee More, MD    Referring MD: [name], MD    ASSESSMENT:    1. PAF (paroxysmal atrial fibrillation) ([place])   2. On amiodarone therapy   3. Anticoagulated   4. Sinus bradycardia   5. Hypertensive heart disease with chronic diastolic congestive heart failure ([place])   6. CAD in native artery   7. Pure hypercholesterolemia    PLAN:    In order of problems listed above:  Is doing much better maintaining sinus rhythm we will continue his low-dose amiodarone without toxicity and his current anticoagulant. No recurrent clinical bradycardia At target continue current treatment including his diuretic Stable CAD having  he is  having no anginal discomfort continue his current medical therapy including lipid-lowering high intensity statin   Next appointment: 6 months   Medication Adjustments/Labs and Tests Ordered: Current medicines are reviewed at length with the patient today.  Concerns regarding medicines are outlined above.  No orders of the defined types were placed in this encounter.  No orders of the defined types were placed in this encounter.   Chief Complaint  Patient presents with   Follow-up   Atrial Fibrillation   Coronary Artery Disease   Congestive Heart Failure    History of Present Illness:    John Kemp is a 77 y.o. male with a hx of accessible atrial fibrillation maintaining sinus rhythm on low-dose amiodarone and with his previous bradycardia no AV blocking agents for rate suppression, chronic anticoagulation, sick sinus syndrome with sinus bradycardia, left bundle branch block, CAD, hypertensive heart disease with diastolic heart failure and hyper lipidemia last seen 05/05/2022 Compliance with diet, lifestyle and medications: Yes  He is doing wellness at [facility] cardiac rehab but it is helping  him with his activity level. Maintain sinus rhythm no side effects from his anticoagulant and he tolerates his statin without muscle pain or weakness He has had no angina edema orthopnea palpitation or syncope Past Medical History:  Diagnosis Date   A-fib ([place])    Acquired thrombophilia ([place]) 11/17/2019   Acute bronchitis due to other specified organisms 10/28/2021   Acute cough 10/20/2021   Acute non-recurrent maxillary sinusitis 10/28/2021   Anticoagulated 01/21/2015   Atherosclerotic heart disease of native coronary artery without angina pectoris    Atrial fibrillation with rapid ventricular response ([place]) 11/12/2021   Atrial flutter with rapid ventricular response ([place]) 11/12/2021   CAD in native artery 01/21/2015   Cancer (HCC)    skin cancer   CHF (congestive heart failure) ([place]) 11/05/2019   Chronic diastolic (congestive) heart failure (HCC)    Chronic diastolic heart failure ([place]) 01/21/2015   Last Assessment & Plan:  Is congestive heart failure is mildly decompensated he is edematous he'll increase the dose of his diuretic today the lab work performed including an ARB referred to care transitions for home care he'll follow-up in my office with me in 4-6 weeks daily weights sodium restrictionand compliance of medications beinghemoglobin of will   Chronic obstructive pulmonary disease (COPD) ([place])    Coronary artery disease involving autologous artery coronary bypass graft 11/12/2021   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 11/17/2019   Dizziness 11/12/2021   Dyslipidemia 11/12/2021   Elevated brain natriuretic peptide (BNP) level 11/12/2021   Essential hypertension 01/21/2015   right superior quadrantanopsia   Hearing  loss of right ear due to cerumen impaction 10/20/2021   High cholesterol 10/20/2017   Hx of CABG 03/02/2017   Hypertension    Hypertensive heart disease with heart failure ([place])    Hypothyroidism (acquired) 04/24/2018   Impaired fasting glucose     Left rotator cuff tear 11/12/2021   Mixed hyperlipidemia 11/17/2019   Myocardial infarction ([place])    Non-seasonal allergic rhinitis due to pollen 10/20/2021   On amiodarone therapy 01/21/2015   PAF (paroxysmal atrial fibrillation) (HCC)    Primary insomnia    Sequela of cardioembolic stroke [id number]   Severe obesity with body mass index (BMI) of 36.0 to 36.9 with serious comorbidity ([place])    Squamous cell carcinoma 11/05/2019   Typical atrial flutter ([place]) 01/21/2015   Vitamin D deficiency, unspecified     Past Surgical History:  Procedure Laterality Date   APPENDECTOMY  1956   CARDIAC CATHETERIZATION     CARDIOVERSION  2002   CHOLECYSTECTOMY  2006   CORONARY ARTERY BYPASS GRAFT  2001   HERNIA REPAIR     RADIOLOGY WITH ANESTHESIA Bilateral 01/18/2018   Procedure: MRI WITH ANESTHESIA LUMBAR SPINE WITH AND WITHOUT CONTRAST;  Surgeon: Radiologist, Medication, MD;  Location: [place];  Service: Radiology;  Laterality: Bilateral;   RADIOLOGY WITH ANESTHESIA N/A 04/19/2022   Procedure: MRI WITH BRAIN WITH AND WITHOUT CONTRAST;  Surgeon: Radiologist, Medication, MD;  Location: [facility];  Service: Radiology;  Laterality: N/A;   ROTATOR CUFF REPAIR Left    TONSILLECTOMY  1975    Current Medications: Current Meds  Medication Sig   acetaminophen (TYLENOL) 500 MG tablet Take 500 mg by mouth 2 (two) times daily.   amiodarone (PACERONE) 200 MG tablet Take 1 tablet (200 mg total) by mouth daily.   amLODipine (NORVASC) 5 MG tablet Take 1 tablet (5 mg total) by mouth daily.   atorvastatin (LIPITOR) 20 MG tablet TAKE 1 TABLET BY MOUTH EVERY DAY   Azelastine-Fluticasone 137-50 MCG/ACT SUSP Place 1 spray into the nose every 12 (twelve) hours.   ELIQUIS 5 MG TABS tablet TAKE 1 TABLET BY MOUTH TWICE A DAY   finasteride (PROSCAR) 5 MG tablet Take 1 tablet (5 mg total) by mouth daily.   Finerenone (KERENDIA) 10 MG TABS Take 1 tablet (10 mg total) by mouth daily.   fluticasone (FLONASE) 50 MCG/ACT nasal  spray Place 2 sprays into both nostrils daily. Use in addition to azelastine.   Inulin (FIBER CHOICE PO) Take 3 tablets by mouth daily as needed (Constipation).   ketoconazole (NIZORAL) 2 % shampoo Apply topically 3 (three) times a week.   levothyroxine (SYNTHROID) 112 MCG tablet Take 1 tablet (112 mcg total) by mouth daily.   nitroGLYCERIN (NITROSTAT) 0.4 MG SL tablet Place 1 tablet (0.4 mg total) under the tongue every 5 (five) minutes as needed for chest pain.   nystatin cream (MYCOSTATIN) Apply topically.   potassium chloride (KLOR-CON M) 10 MEQ tablet Take 10 mEq by mouth daily as needed (take with furosemide).   tamsulosin (FLOMAX) 0.4 MG CAPS capsule TAKE 1 CAPSULE BY MOUTH EVERY DAY   torsemide (DEMADEX) 20 MG tablet TAKE 1 TABLET BY MOUTH EVERY DAY   triamcinolone cream (KENALOG) 0.1 % SMARTSIG:1 Application Topical 2-3 Times Daily   valsartan (DIOVAN) 320 MG tablet TAKE 1 TABLET BY MOUTH EVERY DAY   Vitamin D, Ergocalciferol, (DRISDOL) 1.25 MG (50000 UNIT) CAPS capsule Take 1 capsule (50,000 Units total) by mouth every 7 (seven) days.   zolpidem (AMBIEN) 10 MG  tablet Take 1 tablet (10 mg total) by mouth at bedtime.     Allergies:   Pravastatin, Prednisone, Avelox [moxifloxacin hcl], Cartia xt [diltiazem hcl], Pravachol [pravastatin sodium], and Zetia [ezetimibe]   Social History   Socioeconomic History   Marital status: Married    Spouse name: Manuela Schwartz   Number of children: 2   Years of education: Not on file   Highest education level: Not on file  Occupational History   Occupation: Retired  Tobacco Use   Smoking status: Former    Packs/day: 1.00    Years: 15.00    Total pack years: 15.00    Types: Cigarettes    Quit date: 08/29/1978    Years since quitting: 44.2    Passive exposure: Past   Smokeless tobacco: Never  Vaping Use   Vaping Use: Never used  Substance and Sexual Activity   Alcohol use: No    Alcohol/week: 0.0 standard drinks of alcohol   Drug use: No    Sexual activity: Not Currently  Other Topics Concern   Not on file  Social History Narrative   Not on file   Social Determinants of Health   Financial Resource Strain: High Risk (01/28/2022)   Overall Financial Resource Strain (CARDIA)    Difficulty of Paying Living Expenses: Hard  Food Insecurity: No Food Insecurity (12/07/2020)   Hunger Vital Sign    Worried About Running Out of Food in the Last Year: Never true    Ran Out of Food in the Last Year: Never true  Transportation Needs: No Transportation Needs (09/09/2022)   PRAPARE - Hydrologist (Medical): No    Lack of Transportation (Non-Medical): No  Physical Activity: Not on file  Stress: Not on file  Social Connections: Not on file     Family History: The patient'sfamily history includes Arrhythmia in his father; Breast cancer in his mother; Diabetes in his brother; Diabetes type II in his sister; Heart disease in his father. ROS:   Please see the history of present illness.    All other systems reviewed and are negative.  EKGs/Labs/Other Studies Reviewed:    The following studies were reviewed today:  Cardiac Studies & Procedures       ECHOCARDIOGRAM  ECHOCARDIOGRAM COMPLETE 02/15/2022  Narrative ECHOCARDIOGRAM REPORT    Patient Name:   AYUSH DINELLI Date of Exam: 02/15/2022 Medical Rec #:  [id number]         Height:       70.0 in Accession #:    [id number]        Weight:       225.0 lb Date of Birth:  Nov 03, 1945         BSA:          2.194 m Patient Age:    77 years          BP:           161/79 mmHg Patient Gender: M                 HR:           53 bpm. Exam Location:  [facility]  Procedure: 2D Echo, Cardiac Doppler, Color Doppler and Intracardiac Opacification Agent  Indications:    Syncope R55, Dizziness [R42 (ICD-10-CM)]; Chronic diastolic heart failure (HCC) [I50.32 (ICD-10-CM)]  History:        Patient has prior history of Echocardiogram examinations, most recent  04/03/2017. CAD, Arrythmias:Atrial Fibrillation; Risk Factors:Hypertension and Dyslipidemia.  Sonographer:    Luane School RDCS Referring Phys: [phone] KIRSTEN COX   Sonographer Comments: Suboptimal apical window. Global longitudinal strain was attempted. IMPRESSIONS   1. Left ventricular ejection fraction, by estimation, is 60 to 65%. The left ventricle has normal function. The left ventricle has no regional wall motion abnormalities. There is mild concentric left ventricular hypertrophy. Left ventricular diastolic parameters are consistent with Grade II diastolic dysfunction (pseudonormalization). Elevated left atrial pressure. 2. Right ventricular systolic function is mildly reduced. The right ventricular size is not well visualized. Tricuspid regurgitation signal is inadequate for assessing PA pressure. 3. Left atrial size was mild to moderately dilated. 4. The mitral valve is normal in structure. No evidence of mitral valve regurgitation. No evidence of mitral stenosis. 5. The aortic valve is normal in structure. Aortic valve regurgitation is trivial. Aortic valve sclerosis/calcification is present, without any evidence of aortic stenosis. 6. Aortic Normal DTA. 7. The inferior vena cava is normal in size with greater than 50% respiratory variability, suggesting right atrial pressure of 3 mmHg.  FINDINGS Left Ventricle: Left ventricular ejection fraction, by estimation, is 60 to 65%. The left ventricle has normal function. The left ventricle has no regional wall motion abnormalities. Definity contrast agent was given IV to delineate the left ventricular endocardial borders. Global longitudinal strain performed but not reported based on interpreter judgement due to suboptimal tracking. The left ventricular internal cavity size was normal in size. There is mild concentric left ventricular hypertrophy. Left ventricular diastolic parameters are consistent with Grade II diastolic dysfunction  (pseudonormalization). Elevated left atrial pressure.  Right Ventricle: The right ventricular size is not well visualized. Right vetricular wall thickness was not assessed. Right ventricular systolic function is mildly reduced. Tricuspid regurgitation signal is inadequate for assessing PA pressure. The tricuspid regurgitant velocity is 1.32 m/s, and with an assumed right atrial pressure of 3 mmHg, the estimated right ventricular systolic pressure is [id number] mmHg.  Left Atrium: Left atrial size was mild to moderately dilated.  Right Atrium: Right atrial size was not well visualized.  Pericardium: There is no evidence of pericardial effusion.  Mitral Valve: The mitral valve is normal in structure. No evidence of mitral valve regurgitation. No evidence of mitral valve stenosis.  Tricuspid Valve: The tricuspid valve is normal in structure. Tricuspid valve regurgitation is not demonstrated. No evidence of tricuspid stenosis.  Aortic Valve: The aortic valve is normal in structure. Aortic valve regurgitation is trivial. Aortic regurgitation PHT measures 667 msec. Aortic valve sclerosis/calcification is present, without any evidence of aortic stenosis.  Pulmonic Valve: The pulmonic valve was not well visualized. Pulmonic valve regurgitation is not visualized. No evidence of pulmonic stenosis.  Aorta: The aortic arch was not well visualized, the ascending aorta was not well visualized, the aortic root and ascending aorta are structurally normal, with no evidence of dilitation and Normal DTA.  Venous: The pulmonary veins were not well visualized. The inferior vena cava is normal in size with greater than 50% respiratory variability, suggesting right atrial pressure of 3 mmHg.  IAS/Shunts: No atrial level shunt detected by color flow Doppler.   LEFT VENTRICLE PLAX 2D LVIDd:         5.90 cm   Diastology LVIDs:         3.70 cm   LV e' medial:    4.73 cm/s LV PW:         1.30 cm   LV E/e' medial:   18.8 LV IVS:  1.20 cm   LV e' lateral:   7.99 cm/s LVOT diam:     2.00 cm   LV E/e' lateral: 11.1 LV SV:         92 LV SV Index:   42 LVOT Area:     3.14 cm   RIGHT VENTRICLE            IVC RV S prime:     6.46 cm/s  IVC diam: 1.30 cm TAPSE (M-mode): 1.8 cm  LEFT ATRIUM             Index        RIGHT ATRIUM           Index LA diam:        4.50 cm 2.05 cm/m   RA Area:     19.70 cm LA Vol (A2C):   62.1 ml 28.30 ml/m  RA Volume:   61.00 ml  27.80 ml/m LA Vol (A4C):   51.0 ml 23.24 ml/m LA Biplane Vol: 56.3 ml 25.66 ml/m AORTIC VALVE             PULMONIC VALVE LVOT Vmax:   114.00 cm/s PR End Diast Vel: 3.82 msec LVOT Vmean:  74.600 cm/s LVOT VTI:    0.293 m AI PHT:      667 msec  AORTA Ao Root diam: 2.60 cm Ao Asc diam:  3.30 cm Ao Desc diam: 2.70 cm  MITRAL VALVE               TRICUSPID VALVE MV Area (PHT): 2.71 cm    TR Peak grad:   7.0 mmHg MV Decel Time: 280 msec    TR Vmax:        132.00 cm/s MV E velocity: 88.70 cm/s MV A velocity: 71.10 cm/s  SHUNTS MV E/A ratio:  1.25        Systemic VTI:  0.29 m Systemic Diam: 2.00 cm  Shirlee More MD Electronically signed by Shirlee More MD Signature Date/Time: 02/15/2022/5:52:43 PM    Final    MONITORS  CARDIAC EVENT MONITOR 01/06/2022  Narrative 30-day event monitor was performed 12/01/2021 to 12/30/2021. Rhythm throughout with sinus sinus bradycardia was noted to minimum rate of 40 bpm. Are no pauses of 3 seconds or greater there were no episodes of second or third-degree AV block.  There are no episodes of complex ventricular arrhythmia atrial fibrillation or flutter. There were no symptomatic events.           EKG:  EKG ordered today and personally reviewed.  The ekg ordered today demonstrates sinus rhythm poor R wave progression old inferior MI  Recent Labs: 11/29/2021: B Natriuretic Peptide 136.3; Magnesium 2.1 07/05/2022: NT-Pro BNP 140 09/12/2022: Hemoglobin 14.1; Platelets 190; TSH 5.490 09/29/2022:  ALT 16; BUN 21; Creatinine, Ser 1.39; Potassium 3.8; Sodium 142  Recent Lipid Panel    Component Value Date/Time   CHOL 150 09/12/2022 1032   TRIG 142 09/12/2022 1032   HDL 40 09/12/2022 1032   CHOLHDL 3.8 09/12/2022 1032   CHOLHDL 5.9 04/02/2017 0213   VLDL 35 04/02/2017 0213   LDLCALC 85 09/12/2022 1032    Physical Exam:    VS:  BP 114/64 (BP Location: Right Arm, Patient Position: Sitting)   Pulse 64   Ht '5\' 10"'$  (1.778 m)   Wt 245 lb (111.1 kg)   SpO2 95%   BMI 35.15 kg/m     Wt Readings from Last 3 Encounters:  11/03/22 245 lb (111.1 kg)  09/19/22 251 lb (113.9 kg)  09/12/22 249 lb (112.9 kg)     GEN:  Well nourished, well developed in no acute distress HEENT: Normal NECK: No JVD; No carotid bruits LYMPHATICS: No lymphadenopathy CARDIAC: RRR, no murmurs, rubs, gallops RESPIRATORY:  Clear to auscultation without rales, wheezing or rhonchi  ABDOMEN: Soft, non-tender, non-distended MUSCULOSKELETAL:  No edema; No deformity  SKIN: Warm and dry NEUROLOGIC:  Alert and oriented x 3 PSYCHIATRIC:  Normal affect    Signed, Shirlee More, MD  11/03/2022 1:48 PM    [facility]

## 2022-11-03 NOTE — Patient Instructions (Signed)
Medication Instructions:  Your physician recommends that you continue on your current medications as directed. Please refer to the Current Medication list given to you today.  *If you need a refill on your cardiac medications before your next appointment, please call your pharmacy*   Lab Work: None If you have labs (blood work) drawn today and your tests are completely normal, you will receive your results only by: [place] (if you have MyChart) OR A paper copy in the mail If you have any lab test that is abnormal or we need to change your treatment, we will call you to review the results.   Testing/Procedures: None   Follow-Up: At [facility], you and your health needs are our priority.  As part of our continuing mission to provide you with exceptional heart care, we have created designated Provider Care Teams.  These Care Teams include your primary Cardiologist (physician) and Advanced Practice Providers (APPs -  Physician Assistants and Nurse Practitioners) who all work together to provide you with the care you need, when you need it.  We recommend signing up for the patient portal called "MyChart".  Sign up information is provided on this After Visit Summary.  MyChart is used to connect with patients for Virtual Visits (Telemedicine).  Patients are able to view lab/test results, encounter notes, upcoming appointments, etc.  Non-urgent messages can be sent to your provider as well.   To learn more about what you can do with MyChart, go to [email or web address].    Your next appointment:   6 month(s)  Provider:   Shirlee More, MD    Other Instructions None

## 2022-11-16 ENCOUNTER — Other Ambulatory Visit: Payer: Self-pay | Admitting: Family Medicine

## 2022-11-21 ENCOUNTER — Other Ambulatory Visit: Payer: Self-pay

## 2022-11-21 ENCOUNTER — Telehealth: Payer: Self-pay

## 2022-11-21 DIAGNOSIS — F5101 Primary insomnia: Secondary | ICD-10-CM

## 2022-11-21 MED ORDER — LEVOTHYROXINE SODIUM 112 MCG PO TABS: 112.0000 ug | ORAL_TABLET | Freq: Every day | ORAL | 0 refills | Status: DC

## 2022-11-21 NOTE — Telephone Encounter (Signed)
[name] called with questions about his thyroid medication. He most recently had been taking levothyroxine 112 mcg.  When he picked up his recent refill, [facility] had given him Levothyroxine 100 mcg.  His most recent labs reveal that he should be taking the 112 mcg.  He is going to pick-up samples and I will call [facility] for clarification of his profile.

## 2022-12-14 ENCOUNTER — Other Ambulatory Visit: Payer: Self-pay | Admitting: Family Medicine

## 2022-12-14 DIAGNOSIS — F5101 Primary insomnia: Secondary | ICD-10-CM

## 2023-01-02 ENCOUNTER — Telehealth: Payer: Self-pay

## 2023-01-02 DIAGNOSIS — H6692 Otitis media, unspecified, left ear: Secondary | ICD-10-CM | POA: Diagnosis not present

## 2023-01-02 DIAGNOSIS — H9202 Otalgia, left ear: Secondary | ICD-10-CM | POA: Diagnosis not present

## 2023-01-02 NOTE — Telephone Encounter (Signed)
Pt called today to request a same day appointment for the following symptoms:ear ache that radiates to neck. Symptoms started yesterday. Unfortunately, our schedule is full and we have no openings for today. I offered an appointment for tomorrow but the patient did not want to wait. Pt was notified to go to Urgent Care to be seen.

## 2023-01-03 DIAGNOSIS — J029 Acute pharyngitis, unspecified: Secondary | ICD-10-CM | POA: Diagnosis not present

## 2023-01-03 DIAGNOSIS — J028 Acute pharyngitis due to other specified organisms: Secondary | ICD-10-CM | POA: Diagnosis not present

## 2023-01-04 ENCOUNTER — Other Ambulatory Visit: Payer: Self-pay | Admitting: Cardiology

## 2023-01-04 DIAGNOSIS — I5032 Chronic diastolic (congestive) heart failure: Secondary | ICD-10-CM

## 2023-01-10 NOTE — Progress Notes (Signed)
Subjective:  Patient ID: John Kemp, male    DOB: 08-25-1946  Age: 77 y.o. MRN: [id number]  Chief Complaint  Patient presents with   Medical Management of Chronic Issues    HPI Afib: Taking Eliquis 5 mg twice a day, Amiodarone 200 mg daily.  Hypertension: Takes Amlodipine 5 mg daily, Valsartan 1 tablet  320 mg daily.  Hypothyroidism: Currently on Levothyroxine 112 mcg daily.  BPH: Tamsulosin 0.4 mg daily, Finasteride 5 mg daily. No trouble urinating.   Hyperlipidemia: Atorvastatin 20 mg every day.  CONGESTIVE HEART FAILURE/CAD: Currently on torsemide 20 mg every day, Potassium chloride 10 meq once daily as needed, ATORVASTATIN, AND HAS NTG.   Vitamin D deficiency: recommend vitamin D  50K weekly.   Insomnia: ambien 10 mg before bed.      01/11/2023    8:28 AM 09/12/2022    9:53 AM 09/06/2022   11:37 AM 03/29/2022    3:44 PM 03/17/2022    2:44 PM  Depression screen PHQ 2/9  Decreased Interest 0 0 0 0 0  Down, Depressed, Hopeless 0 0 0 0 0  PHQ - 2 Score 0 0 0 0 0  Altered sleeping 0      Tired, decreased energy 0      Change in appetite 0      Feeling bad or failure about yourself  0      Trouble concentrating 0      Moving slowly or fidgety/restless 0      Suicidal thoughts 0      PHQ-9 Score 0      Difficult doing work/chores Not difficult at all            01/11/2023    8:28 AM  Fall Risk   Falls in the past year? 0  Number falls in past yr: 0  Injury with Fall? 0  Risk for fall due to : No Fall Risks  Follow up Education provided    Patient Care Team: Blane Ohara, MD as PCP - General (Family Medicine) Zettie Pho, [facility] as Pharmacist (Pharmacist) Christinia Gully, AUD (Audiology) Baldo Daub, MD as Consulting Physician (Cardiology)   Review of Systems  Constitutional:  Negative for chills, fatigue, fever and unexpected weight change.  HENT:  Negative for congestion, ear pain, sinus pain and sore throat.   Respiratory:  Negative for cough  and shortness of breath.   Cardiovascular:  Negative for chest pain and palpitations.  Gastrointestinal:  Negative for abdominal pain, blood in stool, constipation, diarrhea, nausea and vomiting.  Endocrine: Negative for polydipsia.  Genitourinary:  Negative for dysuria.  Musculoskeletal:  Negative for back pain.  Skin:  Negative for rash.  Neurological:  Negative for dizziness and headaches.    Current Outpatient Medications on File Prior to Visit  Medication Sig Dispense Refill   acetaminophen (TYLENOL) 500 MG tablet Take 500 mg by mouth 2 (two) times daily.     amiodarone (PACERONE) 200 MG tablet Take 1 tablet (200 mg total) by mouth daily. 90 tablet 2   amLODipine (NORVASC) 5 MG tablet Take 1 tablet (5 mg total) by mouth daily. 90 tablet 1   atorvastatin (LIPITOR) 20 MG tablet TAKE 1 TABLET BY MOUTH EVERY DAY 90 tablet 0   Azelastine-Fluticasone 137-50 MCG/ACT SUSP Place 1 spray into the nose every 12 (twelve) hours. 23 g 5   ELIQUIS 5 MG TABS tablet TAKE 1 TABLET BY MOUTH TWICE A DAY 180 tablet 1   finasteride (PROSCAR)  5 MG tablet Take 1 tablet (5 mg total) by mouth daily. 90 tablet 1   Finerenone (KERENDIA) 10 MG TABS Take 1 tablet (10 mg total) by mouth daily. 30 tablet 2   fluticasone (FLONASE) 50 MCG/ACT nasal spray Place 2 sprays into both nostrils daily. Use in addition to azelastine. 16 g 6   Inulin (FIBER CHOICE PO) Take 3 tablets by mouth daily as needed (Constipation).     ketoconazole (NIZORAL) 2 % shampoo Apply topically 3 (three) times a week.     levothyroxine (SYNTHROID) 112 MCG tablet TAKE 1 TABLET BY MOUTH EVERY DAY 90 tablet 0   nitroGLYCERIN (NITROSTAT) 0.4 MG SL tablet Place 1 tablet (0.4 mg total) under the tongue every 5 (five) minutes as needed for chest pain. 25 tablet 2   nystatin cream (MYCOSTATIN) Apply topically.     potassium chloride (KLOR-CON M) 10 MEQ tablet Take 10 mEq by mouth daily as needed (take with furosemide).     tamsulosin (FLOMAX) 0.4 MG  CAPS capsule TAKE 1 CAPSULE BY MOUTH EVERY DAY 90 capsule 1   torsemide (DEMADEX) 20 MG tablet TAKE 1 TABLET BY MOUTH EVERY DAY 90 tablet 0   triamcinolone cream (KENALOG) 0.1 % SMARTSIG:1 Application Topical 2-3 Times Daily     valsartan (DIOVAN) 320 MG tablet TAKE 1 TABLET BY MOUTH EVERY DAY 90 tablet 1   Vitamin D, Ergocalciferol, (DRISDOL) 1.25 MG (50000 UNIT) CAPS capsule Take 1 capsule (50,000 Units total) by mouth every 7 (seven) days. 12 capsule 2   zolpidem (AMBIEN) 10 MG tablet Take 1 tablet (10 mg total) by mouth at bedtime. 30 tablet 3   No current facility-administered medications on file prior to visit.   Past Medical History:  Diagnosis Date   A-fib (HCC)    Acquired thrombophilia (HCC) 11/17/2019   Acute bronchitis due to other specified organisms 10/28/2021   Acute cough 10/20/2021   Acute non-recurrent maxillary sinusitis 10/28/2021   Anticoagulated 01/21/2015   Atherosclerotic heart disease of native coronary artery without angina pectoris    Atrial fibrillation with rapid ventricular response (HCC) 11/12/2021   Atrial flutter with rapid ventricular response (HCC) 11/12/2021   CAD in native artery 01/21/2015   Cancer (HCC)    skin cancer   CHF (congestive heart failure) (HCC) 11/05/2019   Chronic diastolic (congestive) heart failure (HCC)    Chronic diastolic heart failure (HCC) 01/21/2015   Last Assessment & Plan:  Is congestive heart failure is mildly decompensated he is edematous he'll increase the dose of his diuretic today the lab work performed including an ARB referred to care transitions for home care he'll follow-up in my office with me in 4-6 weeks daily weights sodium restrictionand compliance of medications beinghemoglobin of will   Chronic obstructive pulmonary disease (COPD) (HCC)    Coronary artery disease involving autologous artery coronary bypass graft 11/12/2021   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 11/17/2019   Dizziness  11/12/2021   Dyslipidemia 11/12/2021   Elevated brain natriuretic peptide (BNP) level 11/12/2021   Essential hypertension 01/21/2015   right superior quadrantanopsia   Hearing loss of right ear due to cerumen impaction 10/20/2021   High cholesterol 10/20/2017   Hx of CABG 03/02/2017   Hypertension    Hypertensive heart disease with heart failure (HCC)    Hypothyroidism (acquired) 04/24/2018   Impaired fasting glucose    Left rotator cuff tear 11/12/2021   Mixed hyperlipidemia 11/17/2019   Myocardial infarction (HCC)    Non-seasonal allergic  rhinitis due to pollen 10/20/2021   On amiodarone therapy 01/21/2015   PAF (paroxysmal atrial fibrillation) (HCC)    Primary insomnia    Sequela of cardioembolic stroke 11/17/2019   Severe obesity with body mass index (BMI) of 36.0 to 36.9 with serious comorbidity (HCC)    Squamous cell carcinoma 11/05/2019   Typical atrial flutter (HCC) 01/21/2015   Vitamin D deficiency, unspecified    Past Surgical History:  Procedure Laterality Date   APPENDECTOMY  1956   CARDIAC CATHETERIZATION     CARDIOVERSION  2002   CHOLECYSTECTOMY  2006   CORONARY ARTERY BYPASS GRAFT  2001   HERNIA REPAIR     RADIOLOGY WITH ANESTHESIA Bilateral 01/18/2018   Procedure: MRI WITH ANESTHESIA LUMBAR SPINE WITH AND WITHOUT CONTRAST;  Surgeon: Radiologist, Medication, MD;  Location: [facility] OR;  Service: Radiology;  Laterality: Bilateral;   RADIOLOGY WITH ANESTHESIA N/A 04/19/2022   Procedure: MRI WITH BRAIN WITH AND WITHOUT CONTRAST;  Surgeon: Radiologist, Medication, MD;  Location: [facility] OR;  Service: Radiology;  Laterality: N/A;   ROTATOR CUFF REPAIR Left    TONSILLECTOMY  1975    Family History  Problem Relation Age of Onset   Heart disease Father    Arrhythmia Father    Breast cancer Mother    Diabetes type II Sister    Diabetes Brother    Social History   Socioeconomic History   Marital status: Married    Spouse name: Darl Pikes   Number of children: 2   Years of  education: Not on file   Highest education level: Not on file  Occupational History   Occupation: Retired  Tobacco Use   Smoking status: Former    Packs/day: 1.00    Years: 15.00    Additional pack years: 0.00    Total pack years: 15.00    Types: Cigarettes    Quit date: 08/29/1978    Years since quitting: 44.4    Passive exposure: Past   Smokeless tobacco: Never  Vaping Use   Vaping Use: Never used  Substance and Sexual Activity   Alcohol use: No    Alcohol/week: 0.0 standard drinks of alcohol   Drug use: No   Sexual activity: Not Currently  Other Topics Concern   Not on file  Social History Narrative   Not on file   Social Determinants of Health   Financial Resource Strain: High Risk (01/28/2022)   Overall Financial Resource Strain (CARDIA)    Difficulty of Paying Living Expenses: Hard  Food Insecurity: No Food Insecurity (12/07/2020)   Hunger Vital Sign    Worried About Running Out of Food in the Last Year: Never true    Ran Out of Food in the Last Year: Never true  Transportation Needs: No Transportation Needs (09/09/2022)   PRAPARE - Administrator, Civil Service (Medical): No    Lack of Transportation (Non-Medical): No  Physical Activity: Not on file  Stress: Not on file  Social Connections: Not on file    Objective:  BP 100/60   Pulse (!) 52   Temp (!) 97.3 F (36.3 C)   Resp 14   Ht 5\' 10"  (1.778 m)   Wt 238 lb (108 kg)   SpO2 96%   BMI 34.15 kg/m      01/11/2023    8:15 AM 11/03/2022    1:09 PM 09/19/2022   11:14 AM  BP/Weight  Systolic BP 100 114 117  Diastolic BP 60 64 67  Wt. (Lbs) 238  245   BMI 34.15 kg/m2 35.15 kg/m2     Physical Exam Vitals reviewed.  Constitutional:      Appearance: Normal appearance. He is obese.  Neck:     Vascular: No carotid bruit.  Cardiovascular:     Rate and Rhythm: Normal rate and regular rhythm.     Pulses: Normal pulses.     Heart sounds: Normal heart sounds.  Pulmonary:     Effort: Pulmonary  effort is normal.     Breath sounds: Normal breath sounds. No wheezing, rhonchi or rales.  Abdominal:     General: Bowel sounds are normal.     Palpations: Abdomen is soft.     Tenderness: There is no abdominal tenderness.  Neurological:     Mental Status: He is alert and oriented to person, place, and time.  Psychiatric:        Mood and Affect: Mood normal.        Behavior: Behavior normal.     Diabetic Foot Exam - Simple   No data filed      Lab Results  Component Value Date   WBC 5.5 01/11/2023   HGB 13.5 01/11/2023   HCT 39.3 01/11/2023   PLT 202 01/11/2023   GLUCOSE 92 01/11/2023   CHOL 142 01/11/2023   TRIG 89 01/11/2023   HDL 44 01/11/2023   LDLCALC 81 01/11/2023   ALT 17 01/11/2023   AST 22 01/11/2023   NA 143 01/11/2023   K 4.2 01/11/2023   CL 102 01/11/2023   CREATININE 1.42 (H) 01/11/2023   BUN 19 01/11/2023   CO2 26 01/11/2023   TSH 4.330 01/11/2023   INR 1.1 02/02/2022   HGBA1C 5.4 06/03/2022   MICROALBUR Positive 150 01/23/2019      Assessment & Plan:    Hypertensive heart disease with heart failure [facility]) Assessment & Plan: Well controlled.  No changes to medicines. Takes Amlodipine 5 mg daily and Valsartan 1 tablet 320 mg daily. Continue to work on eating a healthy diet and exercise.  Labs drawn today.   Management per specialist.  Cardiology  Orders: -     CBC with Differential/Platelet -     Comprehensive metabolic panel -     Vitamin B12 -     Methylmalonic acid, serum  Impaired fasting glucose Assessment & Plan: Hemoglobin A1c 5.4%, 3 month avg of blood sugars, is in prediabetic range.  In order to prevent progression to diabetes, recommend low carb diet and regular exercise    Acquired hypothyroidism Assessment & Plan: Previously well controlled Continue Synthroid at current dose  Recheck TSH and adjust Synthroid as indicated    Orders: -     TSH -     T4, free  Mixed hyperlipidemia Assessment & Plan: Well controlled.   No changes to medicines. Atorvastatin 20 mg every day  Continue to work on eating a healthy diet and exercise.  Labs drawn today.    Orders: -     Lipid panel  Mild vitamin D deficiency -     VITAMIN D 25 Hydroxy (Vit-D Deficiency, Fractures)  Other orders -     Cardiovascular Risk Assessment     No orders of the defined types were placed in this encounter.   Orders Placed This Encounter  Procedures   CBC with Differential/Platelet   Comprehensive metabolic panel   Lipid panel   TSH   T4, free   Vitamin B12   Methylmalonic acid, serum   VITAMIN D 25 Hydroxy (  Vit-D Deficiency, Fractures)   Cardiovascular Risk Assessment     Follow-up: Return in about 6 months (around 07/14/2023) for chronic fasting.   I,Marla I Leal-Borjas,acting as a scribe for Blane Ohara, MD.,have documented all relevant documentation on the behalf of Blane Ohara, MD,as directed by  Blane Ohara, MD while in the presence of Blane Ohara, MD.   An After Visit Summary was printed and given to the patient.  I attest that I have reviewed this visit and agree with the plan scribed by my staff.  Blane Ohara, MD

## 2023-01-11 ENCOUNTER — Ambulatory Visit (INDEPENDENT_AMBULATORY_CARE_PROVIDER_SITE_OTHER): Payer: Medicare Other | Admitting: Family Medicine

## 2023-01-11 ENCOUNTER — Encounter: Payer: Self-pay | Admitting: Family Medicine

## 2023-01-11 VITALS — BP 100/60 | HR 52 | Temp 97.3°F | Resp 14 | Ht 70.0 in | Wt 238.0 lb

## 2023-01-11 DIAGNOSIS — E039 Hypothyroidism, unspecified: Secondary | ICD-10-CM

## 2023-01-11 DIAGNOSIS — I110 Hypertensive heart disease with heart failure: Secondary | ICD-10-CM | POA: Diagnosis not present

## 2023-01-11 DIAGNOSIS — R7301 Impaired fasting glucose: Secondary | ICD-10-CM

## 2023-01-11 DIAGNOSIS — E782 Mixed hyperlipidemia: Secondary | ICD-10-CM | POA: Diagnosis not present

## 2023-01-11 DIAGNOSIS — I509 Heart failure, unspecified: Secondary | ICD-10-CM

## 2023-01-11 DIAGNOSIS — I4891 Unspecified atrial fibrillation: Secondary | ICD-10-CM | POA: Diagnosis not present

## 2023-01-11 DIAGNOSIS — E559 Vitamin D deficiency, unspecified: Secondary | ICD-10-CM

## 2023-01-11 NOTE — Assessment & Plan Note (Signed)
Hemoglobin A1c 5.4%, 3 month avg of blood sugars, is in prediabetic range.  In order to prevent progression to diabetes, recommend low carb diet and regular exercise  

## 2023-01-11 NOTE — Assessment & Plan Note (Signed)
Previously well controlled Continue Synthroid at current dose  Recheck TSH and adjust Synthroid as indicated   

## 2023-01-11 NOTE — Assessment & Plan Note (Signed)
Well controlled.  No changes to medicines. Atorvastatin 20 mg every day  Continue to work on eating a healthy diet and exercise.  Labs drawn today.   

## 2023-01-11 NOTE — Assessment & Plan Note (Signed)
Well controlled.  No changes to medicines. Takes Amlodipine 5 mg daily and Valsartan 1 tablet 320 mg daily. Continue to work on eating a healthy diet and exercise.  Labs drawn today.   Management per specialist.  Cardiology 

## 2023-01-12 LAB — CBC WITH DIFFERENTIAL/PLATELET
Basophils Absolute: 0.1 10*3/uL (ref 0.0–0.2)
Basos: 1 %
EOS (ABSOLUTE): 0.2 10*3/uL (ref 0.0–0.4)
Eos: 3 %
Hematocrit: 39.3 % (ref 37.5–51.0)
Hemoglobin: 13.5 g/dL (ref 13.0–17.7)
Immature Grans (Abs): 0 10*3/uL (ref 0.0–0.1)
Immature Granulocytes: 0 %
Lymphocytes Absolute: 1.6 10*3/uL (ref 0.7–3.1)
Lymphs: 29 %
MCH: 31.8 pg (ref 26.6–33.0)
MCHC: 34.4 g/dL (ref 31.5–35.7)
MCV: 93 fL (ref 79–97)
Monocytes Absolute: 0.6 10*3/uL (ref 0.1–0.9)
Monocytes: 11 %
Neutrophils Absolute: 3 10*3/uL (ref 1.4–7.0)
Neutrophils: 56 %
Platelets: 202 10*3/uL (ref 150–450)
RBC: 4.24 x10E6/uL (ref 4.14–5.80)
RDW: 12.5 % (ref 11.6–15.4)
WBC: 5.5 10*3/uL (ref 3.4–10.8)

## 2023-01-12 LAB — COMPREHENSIVE METABOLIC PANEL
ALT: 17 IU/L (ref 0–44)
AST: 22 IU/L (ref 0–40)
Albumin/Globulin Ratio: 1.8 (ref 1.2–2.2)
Albumin: 4.4 g/dL (ref 3.8–4.8)
Alkaline Phosphatase: 101 IU/L (ref 44–121)
BUN/Creatinine Ratio: 13 (ref 10–24)
BUN: 19 mg/dL (ref 8–27)
Bilirubin Total: 0.6 mg/dL (ref 0.0–1.2)
CO2: 26 mmol/L (ref 20–29)
Calcium: 9.5 mg/dL (ref 8.6–10.2)
Chloride: 102 mmol/L (ref 96–106)
Creatinine, Ser: 1.42 mg/dL — ABNORMAL HIGH (ref 0.76–1.27)
Globulin, Total: 2.4 g/dL (ref 1.5–4.5)
Glucose: 92 mg/dL (ref 70–99)
Potassium: 4.2 mmol/L (ref 3.5–5.2)
Sodium: 143 mmol/L (ref 134–144)
Total Protein: 6.8 g/dL (ref 6.0–8.5)
eGFR: 52 mL/min/{1.73_m2} — ABNORMAL LOW (ref >59)

## 2023-01-12 LAB — CARDIOVASCULAR RISK ASSESSMENT

## 2023-01-12 LAB — TSH: TSH: 4.33 u[IU]/mL (ref 0.450–4.500)

## 2023-01-12 LAB — LIPID PANEL
Chol/HDL Ratio: 3.2 ratio (ref 0.0–5.0)
Cholesterol, Total: 142 mg/dL (ref 100–199)
HDL: 44 mg/dL (ref >39)
LDL Chol Calc (NIH): 81 mg/dL (ref 0–99)
Triglycerides: 89 mg/dL (ref 0–149)
VLDL Cholesterol Cal: 17 mg/dL (ref 5–40)

## 2023-01-12 LAB — T4, FREE: Free T4: 1.74 ng/dL (ref 0.82–1.77)

## 2023-01-15 LAB — METHYLMALONIC ACID, SERUM: Methylmalonic Acid: 1311 nmol/L — ABNORMAL HIGH (ref 0–378)

## 2023-01-15 LAB — VITAMIN D 25 HYDROXY (VIT D DEFICIENCY, FRACTURES): Vit D, 25-Hydroxy: 38.5 ng/mL (ref 30.0–100.0)

## 2023-01-15 LAB — VITAMIN B12: Vitamin B-12: 221 pg/mL — ABNORMAL LOW (ref 232–1245)

## 2023-01-16 ENCOUNTER — Other Ambulatory Visit: Payer: Self-pay

## 2023-01-16 ENCOUNTER — Telehealth: Payer: Self-pay

## 2023-01-16 DIAGNOSIS — L570 Actinic keratosis: Secondary | ICD-10-CM | POA: Diagnosis not present

## 2023-01-16 DIAGNOSIS — D485 Neoplasm of uncertain behavior of skin: Secondary | ICD-10-CM | POA: Diagnosis not present

## 2023-01-16 MED ORDER — CYANOCOBALAMIN 1000 MCG/ML IJ SOLN: 1000.0000 ug | INTRAMUSCULAR | 1 refills | Status: DC

## 2023-01-16 MED ORDER — "BD ECLIPSE SYRINGE/NEEDLE 25G X 5/8"" 3 ML MISC": 0 refills | Status: DC

## 2023-01-16 NOTE — Telephone Encounter (Signed)
Patient came in office today with worries of blood work that he seen on his my chart. His MMA was high and his b12 was low.  I explain to patient that the MMA is high which indicates maybe b12 deficiency and his b12 was low.  Per Dr. Sedalia Muta she will send in b12 injections 1 ml once a week for 4 weeks. Than he will come in after 4 weeks for a lab and if it's still low we will continue another 4 weeks or either once a month.  Patient Made Aware, Verbalized Understanding. B12 was sent in to his pharmacy today.

## 2023-02-05 ENCOUNTER — Other Ambulatory Visit: Payer: Self-pay | Admitting: Family Medicine

## 2023-02-06 ENCOUNTER — Other Ambulatory Visit: Payer: Self-pay

## 2023-02-06 ENCOUNTER — Other Ambulatory Visit: Payer: Medicare Other

## 2023-02-06 DIAGNOSIS — E538 Deficiency of other specified B group vitamins: Secondary | ICD-10-CM

## 2023-02-07 LAB — VITAMIN B12: Vitamin B-12: >2000 pg/mL — ABNORMAL HIGH (ref 232–1245)

## 2023-02-22 ENCOUNTER — Other Ambulatory Visit: Payer: Self-pay | Admitting: Family Medicine

## 2023-02-22 DIAGNOSIS — N401 Enlarged prostate with lower urinary tract symptoms: Secondary | ICD-10-CM

## 2023-02-27 ENCOUNTER — Other Ambulatory Visit: Payer: Self-pay | Admitting: Cardiology

## 2023-02-27 ENCOUNTER — Other Ambulatory Visit: Payer: Self-pay | Admitting: Family Medicine

## 2023-02-27 DIAGNOSIS — I110 Hypertensive heart disease with heart failure: Secondary | ICD-10-CM

## 2023-03-01 ENCOUNTER — Other Ambulatory Visit: Payer: Self-pay | Admitting: Family Medicine

## 2023-03-11 ENCOUNTER — Other Ambulatory Visit: Payer: Self-pay | Admitting: Family Medicine

## 2023-03-11 DIAGNOSIS — F5101 Primary insomnia: Secondary | ICD-10-CM

## 2023-04-03 DIAGNOSIS — B029 Zoster without complications: Secondary | ICD-10-CM | POA: Diagnosis not present

## 2023-04-22 ENCOUNTER — Other Ambulatory Visit: Payer: Self-pay | Admitting: Family Medicine

## 2023-04-23 ENCOUNTER — Other Ambulatory Visit: Payer: Self-pay | Admitting: Family Medicine

## 2023-04-24 ENCOUNTER — Telehealth: Payer: Self-pay | Admitting: Family Medicine

## 2023-04-24 ENCOUNTER — Other Ambulatory Visit: Payer: Self-pay

## 2023-04-24 MED ORDER — APIXABAN 5 MG PO TABS: 5.0000 mg | ORAL_TABLET | Freq: Two times a day (BID) | ORAL | 1 refills | Status: DC

## 2023-04-24 NOTE — Telephone Encounter (Signed)
Prescription Request  04/24/2023  LOV: 01/11/2023  What is the name of the medication or equipment?  apixaban (ELIQUIS) 5 MG TABS tablet   Which pharmacy would you like this sent to?  [id number] Levan, [place] - [address] [place] [address] Phone: [phone] Fax: [phone]      Patient notified that their request is being sent to the clinical staff for review and that they should receive a response within 2 business days.   Please advise at Mobile [phone] (mobile)

## 2023-04-25 ENCOUNTER — Ambulatory Visit: Payer: Medicare Other | Admitting: Cardiology

## 2023-05-20 IMAGING — DX DG CHEST 1V PORT
1 series · 1 of 1 positions shown · non-contrast
Comparison: 04/02/2017 chest radiograph.

CLINICAL DATA: COVID positive yesterday, lower extremity weakness

EXAM:
PORTABLE CHEST 1 VIEW

[chest ap]
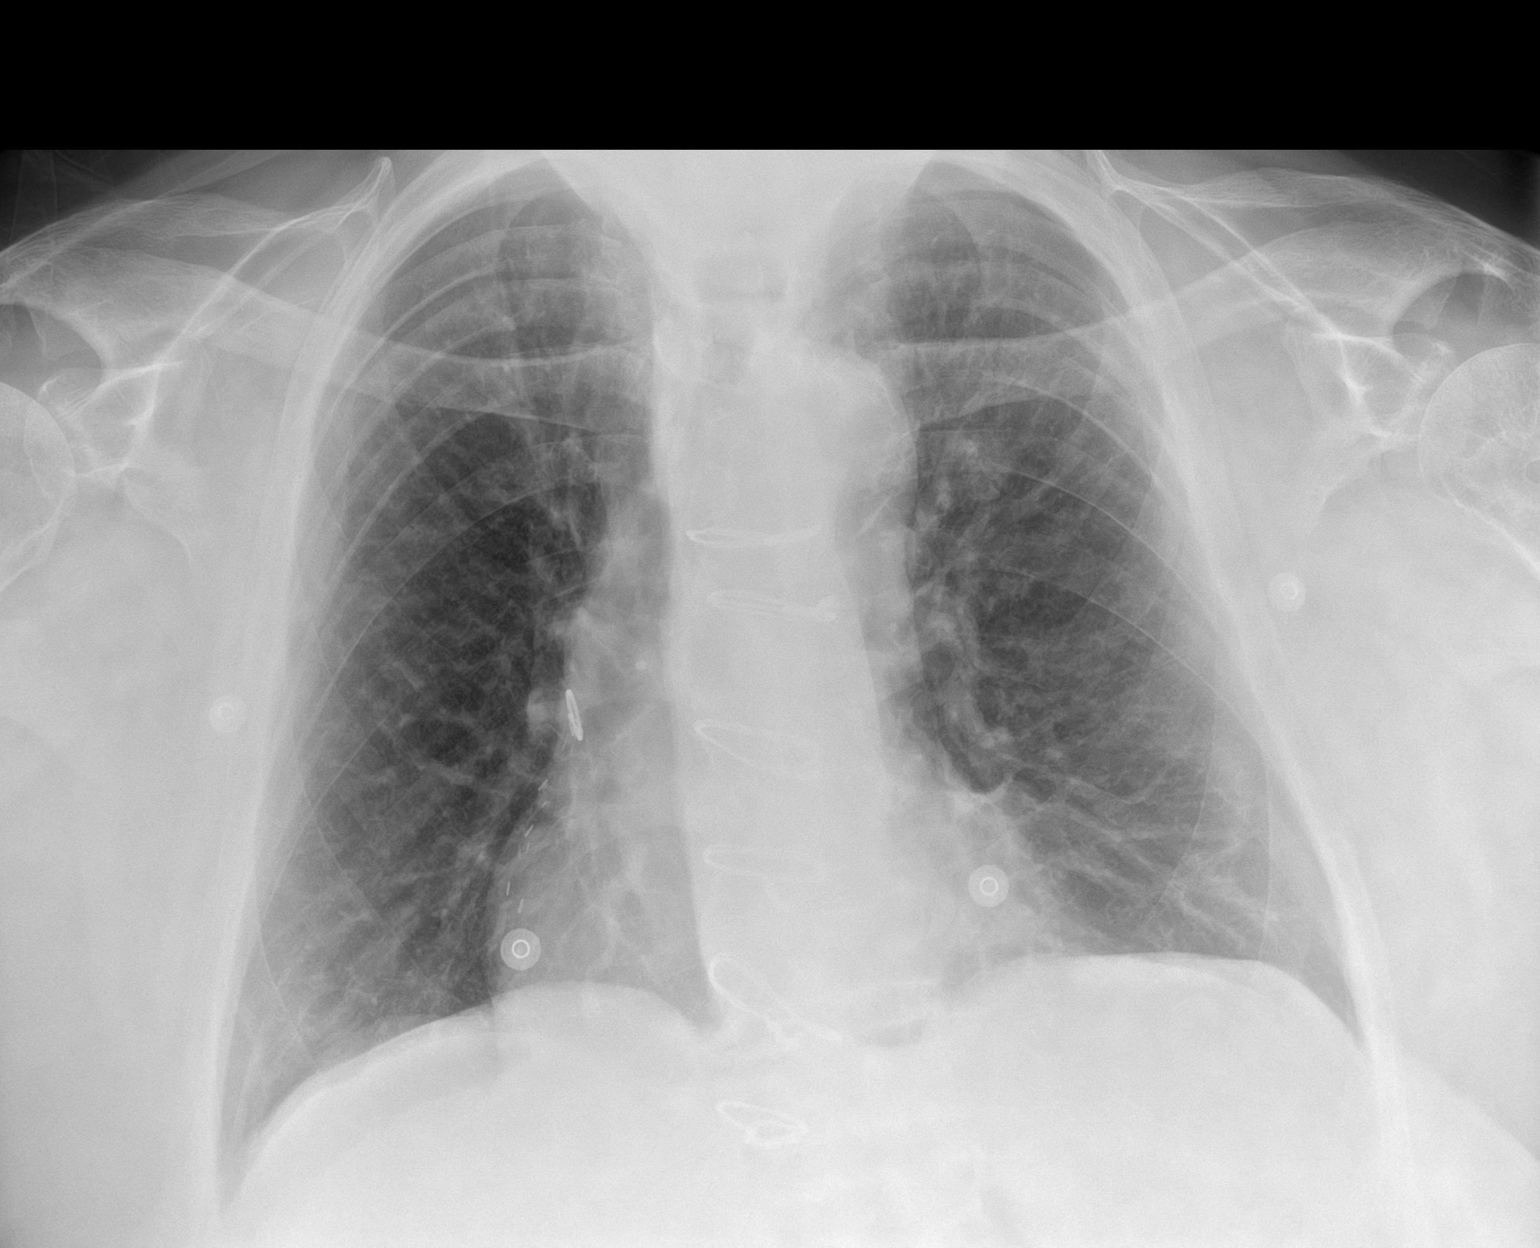

[1 of 1 positions shown; findings below may reference images not displayed]

FINDINGS: Intact sternotomy wires. Stable cardiomediastinal silhouette with
top-normal heart size. No pneumothorax. No pleural effusion. Lungs
appear clear, with no acute consolidative airspace disease and no
pulmonary edema.
IMPRESSION: No active disease.

## 2023-05-29 DIAGNOSIS — Z23 Encounter for immunization: Secondary | ICD-10-CM | POA: Diagnosis not present

## 2023-06-03 ENCOUNTER — Other Ambulatory Visit: Payer: Self-pay | Admitting: Family Medicine

## 2023-06-03 DIAGNOSIS — J069 Acute upper respiratory infection, unspecified: Secondary | ICD-10-CM | POA: Diagnosis not present

## 2023-06-03 DIAGNOSIS — U071 COVID-19: Secondary | ICD-10-CM | POA: Diagnosis not present

## 2023-06-04 ENCOUNTER — Other Ambulatory Visit: Payer: Self-pay | Admitting: Family Medicine

## 2023-06-04 DIAGNOSIS — F5101 Primary insomnia: Secondary | ICD-10-CM

## 2023-06-19 NOTE — Progress Notes (Unsigned)
Cardiology Office Note:    Date:  06/20/2023   ID:  John Kemp, DOB Feb 18, 1946, MRN [id number]  PCP:  Blane Ohara, MD  Cardiologist:  Norman Herrlich, MD    Referring MD: Blane Ohara, MD    ASSESSMENT:    1. PAF (paroxysmal atrial fibrillation) (HCC)   2. Sinus bradycardia   3. CAD in native artery   4. Hypertensive heart disease with heart failure (HCC)    PLAN:    In order of problems listed above:  John Kemp is doing quite well recently maintaining sinus rhythm on low-dose amiodarone without toxicity and also continue his current anticoagulant. No recurrence of symptomatic bradycardia Stable CAD no anginal discomfort changes current medical treatment including anticoagulant and his high intensity statin Well compensated no edema continue his current loop diuretic   Next appointment: 6 months   Medication Adjustments/Labs and Tests Ordered: Current medicines are reviewed at length with the patient today.  Concerns regarding medicines are outlined above.  Orders Placed This Encounter  Procedures   EKG 12-Lead   No orders of the defined types were placed in this encounter.    History of Present Illness:    John Kemp is a 77 y.o. male with a hx of paroxysmal atrial fibrillation maintaining sinus rhythm with low-dose amiodarone bradycardia avoiding AV blocking medication chronic anticoagulation left bundle branch block hypertensive heart disease with diastolic heart failure CAD and hyper lipidemia last seen 11/03/2002.   Compliance with diet, lifestyle and medications: Yes  He continues in wellness cardiac rehabilitation has had a marked improvement in his sense of wellbeing says is the best he has felt in years and presently is not having angina edema shortness of breath palpitation or syncope His LDL is optimized on lipid-lowering therapy He has had no bleeding from his anticoagulant he tolerates his high intensity statin without muscle pain or  weakness. He has had no recurrent atrial arrhythmia with low-dose amiodarone Past Medical History:  Diagnosis Date   A-fib (HCC)    Acquired thrombophilia (HCC) 11/17/2019   Acute bronchitis due to other specified organisms 10/28/2021   Acute cough 10/20/2021   Acute non-recurrent maxillary sinusitis 10/28/2021   Anticoagulated 01/21/2015   Atherosclerotic heart disease of native coronary artery without angina pectoris    Atrial fibrillation with rapid ventricular response (HCC) 11/12/2021   Atrial flutter with rapid ventricular response (HCC) 11/12/2021   CAD in native artery 01/21/2015   Cancer (HCC)    skin cancer   CHF (congestive heart failure) (HCC) 11/05/2019   Chronic diastolic (congestive) heart failure (HCC)    Chronic diastolic heart failure (HCC) 01/21/2015   Last Assessment & Plan:  Is congestive heart failure is mildly decompensated he is edematous he'll increase the dose of his diuretic today the lab work performed including an ARB referred to care transitions for home care he'll follow-up in my office with me in 4-6 weeks daily weights sodium restrictionand compliance of medications beinghemoglobin of will   Chronic obstructive pulmonary disease (COPD) (HCC)    Coronary artery disease involving autologous artery coronary bypass graft 11/12/2021   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 11/17/2019   Dizziness 11/12/2021   Dyslipidemia 11/12/2021   Elevated brain natriuretic peptide (BNP) level 11/12/2021   Essential hypertension 01/21/2015   right superior quadrantanopsia   Hearing loss of right ear due to cerumen impaction 10/20/2021   High cholesterol 10/20/2017   Hx of CABG 03/02/2017   Hypertension    Hypertensive heart disease  with heart failure (HCC)    Hypothyroidism (acquired) 04/24/2018   Impaired fasting glucose    Left rotator cuff tear 11/12/2021   Mixed hyperlipidemia 11/17/2019   Myocardial infarction (HCC)    Non-seasonal allergic  rhinitis due to pollen 10/20/2021   On amiodarone therapy 01/21/2015   PAF (paroxysmal atrial fibrillation) (HCC)    Primary insomnia    Sequela of cardioembolic stroke 11/17/2019   Severe obesity with body mass index (BMI) of 36.0 to 36.9 with serious comorbidity (HCC)    Squamous cell carcinoma 11/05/2019   Typical atrial flutter (HCC) 01/21/2015   Vitamin D deficiency, unspecified     Current Medications: Current Meds  Medication Sig   acetaminophen (TYLENOL) 500 MG tablet Take 500 mg by mouth 2 (two) times daily.   amiodarone (PACERONE) 200 MG tablet Take 1 tablet (200 mg total) by mouth daily.   amLODipine (NORVASC) 5 MG tablet TAKE 1 TABLET (5 MG TOTAL) BY MOUTH DAILY.   atorvastatin (LIPITOR) 20 MG tablet TAKE 1 TABLET BY MOUTH EVERY DAY   Azelastine-Fluticasone 137-50 MCG/ACT SUSP Place 1 spray into the nose every 12 (twelve) hours.   cyanocobalamin (VITAMIN B12) 1000 MCG/ML injection INJECT 1 ML (1,000 MCG TOTAL) INTO THE MUSCLE ONCE A WEEK. INJECT 1 ML ONCE A WEEK FOR 4 WEEKS   ELIQUIS 5 MG TABS tablet TAKE 1 TABLET BY MOUTH TWICE A DAY (Patient taking differently: Take 5 mg by mouth 2 (two) times daily.)   finasteride (PROSCAR) 5 MG tablet Take 1 tablet (5 mg total) by mouth daily.   Finerenone (KERENDIA) 10 MG TABS Take 1 tablet (10 mg total) by mouth daily.   fluticasone (FLONASE) 50 MCG/ACT nasal spray Place 2 sprays into both nostrils daily. Use in addition to azelastine.   Inulin (FIBER CHOICE PO) Take 3 tablets by mouth daily as needed (Constipation).   ketoconazole (NIZORAL) 2 % shampoo Apply 1 Application topically 3 (three) times a week.   levothyroxine (SYNTHROID) 112 MCG tablet TAKE 1 TABLET BY MOUTH EVERY DAY   nitroGLYCERIN (NITROSTAT) 0.4 MG SL tablet Place 1 tablet (0.4 mg total) under the tongue every 5 (five) minutes as needed for chest pain.   nystatin cream (MYCOSTATIN) Apply 1 Application topically 2 (two) times daily.   potassium chloride (KLOR-CON M) 10  MEQ tablet Take 10 mEq by mouth daily as needed (take with furosemide).   tamsulosin (FLOMAX) 0.4 MG CAPS capsule TAKE 1 CAPSULE BY MOUTH EVERY DAY   torsemide (DEMADEX) 20 MG tablet TAKE 1 TABLET BY MOUTH EVERY DAY   valsartan (DIOVAN) 320 MG tablet TAKE 1 TABLET BY MOUTH EVERY DAY   Vitamin D, Ergocalciferol, (DRISDOL) 1.25 MG (50000 UNIT) CAPS capsule Take 1 capsule (50,000 Units total) by mouth every 7 (seven) days.   zolpidem (AMBIEN) 10 MG tablet Take 1 tablet (10 mg total) by mouth at bedtime.      EKGs/Labs/Other Studies Reviewed:    The following studies were reviewed today:  Cardiac Studies & Procedures       ECHOCARDIOGRAM  ECHOCARDIOGRAM COMPLETE 02/15/2022  Narrative ECHOCARDIOGRAM REPORT    Patient Name:   NECALLI STOUP Date of Exam: 02/15/2022 Medical Rec #:  [id number]         Height:       70.0 in Accession #:    [id number]        Weight:       225.0 lb Date of Birth:  1945-11-28  Cardiology Office Note:    Date:  06/20/2023   ID:  John Kemp, DOB Feb 18, 1946, MRN [id number]  PCP:  Blane Ohara, MD  Cardiologist:  Norman Herrlich, MD    Referring MD: Blane Ohara, MD    ASSESSMENT:    1. PAF (paroxysmal atrial fibrillation) (HCC)   2. Sinus bradycardia   3. CAD in native artery   4. Hypertensive heart disease with heart failure (HCC)    PLAN:    In order of problems listed above:  John Kemp is doing quite well recently maintaining sinus rhythm on low-dose amiodarone without toxicity and also continue his current anticoagulant. No recurrence of symptomatic bradycardia Stable CAD no anginal discomfort changes current medical treatment including anticoagulant and his high intensity statin Well compensated no edema continue his current loop diuretic   Next appointment: 6 months   Medication Adjustments/Labs and Tests Ordered: Current medicines are reviewed at length with the patient today.  Concerns regarding medicines are outlined above.  Orders Placed This Encounter  Procedures   EKG 12-Lead   No orders of the defined types were placed in this encounter.    History of Present Illness:    John Kemp is a 77 y.o. male with a hx of paroxysmal atrial fibrillation maintaining sinus rhythm with low-dose amiodarone bradycardia avoiding AV blocking medication chronic anticoagulation left bundle branch block hypertensive heart disease with diastolic heart failure CAD and hyper lipidemia last seen 11/03/2002.   Compliance with diet, lifestyle and medications: Yes  He continues in wellness cardiac rehabilitation has had a marked improvement in his sense of wellbeing says is the best he has felt in years and presently is not having angina edema shortness of breath palpitation or syncope His LDL is optimized on lipid-lowering therapy He has had no bleeding from his anticoagulant he tolerates his high intensity statin without muscle pain or  weakness. He has had no recurrent atrial arrhythmia with low-dose amiodarone Past Medical History:  Diagnosis Date   A-fib (HCC)    Acquired thrombophilia (HCC) 11/17/2019   Acute bronchitis due to other specified organisms 10/28/2021   Acute cough 10/20/2021   Acute non-recurrent maxillary sinusitis 10/28/2021   Anticoagulated 01/21/2015   Atherosclerotic heart disease of native coronary artery without angina pectoris    Atrial fibrillation with rapid ventricular response (HCC) 11/12/2021   Atrial flutter with rapid ventricular response (HCC) 11/12/2021   CAD in native artery 01/21/2015   Cancer (HCC)    skin cancer   CHF (congestive heart failure) (HCC) 11/05/2019   Chronic diastolic (congestive) heart failure (HCC)    Chronic diastolic heart failure (HCC) 01/21/2015   Last Assessment & Plan:  Is congestive heart failure is mildly decompensated he is edematous he'll increase the dose of his diuretic today the lab work performed including an ARB referred to care transitions for home care he'll follow-up in my office with me in 4-6 weeks daily weights sodium restrictionand compliance of medications beinghemoglobin of will   Chronic obstructive pulmonary disease (COPD) (HCC)    Coronary artery disease involving autologous artery coronary bypass graft 11/12/2021   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 11/17/2019   Dizziness 11/12/2021   Dyslipidemia 11/12/2021   Elevated brain natriuretic peptide (BNP) level 11/12/2021   Essential hypertension 01/21/2015   right superior quadrantanopsia   Hearing loss of right ear due to cerumen impaction 10/20/2021   High cholesterol 10/20/2017   Hx of CABG 03/02/2017   Hypertension    Hypertensive heart disease  Cardiology Office Note:    Date:  06/20/2023   ID:  John Kemp, DOB Feb 18, 1946, MRN [id number]  PCP:  Blane Ohara, MD  Cardiologist:  Norman Herrlich, MD    Referring MD: Blane Ohara, MD    ASSESSMENT:    1. PAF (paroxysmal atrial fibrillation) (HCC)   2. Sinus bradycardia   3. CAD in native artery   4. Hypertensive heart disease with heart failure (HCC)    PLAN:    In order of problems listed above:  John Kemp is doing quite well recently maintaining sinus rhythm on low-dose amiodarone without toxicity and also continue his current anticoagulant. No recurrence of symptomatic bradycardia Stable CAD no anginal discomfort changes current medical treatment including anticoagulant and his high intensity statin Well compensated no edema continue his current loop diuretic   Next appointment: 6 months   Medication Adjustments/Labs and Tests Ordered: Current medicines are reviewed at length with the patient today.  Concerns regarding medicines are outlined above.  Orders Placed This Encounter  Procedures   EKG 12-Lead   No orders of the defined types were placed in this encounter.    History of Present Illness:    John Kemp is a 77 y.o. male with a hx of paroxysmal atrial fibrillation maintaining sinus rhythm with low-dose amiodarone bradycardia avoiding AV blocking medication chronic anticoagulation left bundle branch block hypertensive heart disease with diastolic heart failure CAD and hyper lipidemia last seen 11/03/2002.   Compliance with diet, lifestyle and medications: Yes  He continues in wellness cardiac rehabilitation has had a marked improvement in his sense of wellbeing says is the best he has felt in years and presently is not having angina edema shortness of breath palpitation or syncope His LDL is optimized on lipid-lowering therapy He has had no bleeding from his anticoagulant he tolerates his high intensity statin without muscle pain or  weakness. He has had no recurrent atrial arrhythmia with low-dose amiodarone Past Medical History:  Diagnosis Date   A-fib (HCC)    Acquired thrombophilia (HCC) 11/17/2019   Acute bronchitis due to other specified organisms 10/28/2021   Acute cough 10/20/2021   Acute non-recurrent maxillary sinusitis 10/28/2021   Anticoagulated 01/21/2015   Atherosclerotic heart disease of native coronary artery without angina pectoris    Atrial fibrillation with rapid ventricular response (HCC) 11/12/2021   Atrial flutter with rapid ventricular response (HCC) 11/12/2021   CAD in native artery 01/21/2015   Cancer (HCC)    skin cancer   CHF (congestive heart failure) (HCC) 11/05/2019   Chronic diastolic (congestive) heart failure (HCC)    Chronic diastolic heart failure (HCC) 01/21/2015   Last Assessment & Plan:  Is congestive heart failure is mildly decompensated he is edematous he'll increase the dose of his diuretic today the lab work performed including an ARB referred to care transitions for home care he'll follow-up in my office with me in 4-6 weeks daily weights sodium restrictionand compliance of medications beinghemoglobin of will   Chronic obstructive pulmonary disease (COPD) (HCC)    Coronary artery disease involving autologous artery coronary bypass graft 11/12/2021   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 11/17/2019   Dizziness 11/12/2021   Dyslipidemia 11/12/2021   Elevated brain natriuretic peptide (BNP) level 11/12/2021   Essential hypertension 01/21/2015   right superior quadrantanopsia   Hearing loss of right ear due to cerumen impaction 10/20/2021   High cholesterol 10/20/2017   Hx of CABG 03/02/2017   Hypertension    Hypertensive heart disease  Cardiology Office Note:    Date:  06/20/2023   ID:  John Kemp, DOB Feb 18, 1946, MRN [id number]  PCP:  Blane Ohara, MD  Cardiologist:  Norman Herrlich, MD    Referring MD: Blane Ohara, MD    ASSESSMENT:    1. PAF (paroxysmal atrial fibrillation) (HCC)   2. Sinus bradycardia   3. CAD in native artery   4. Hypertensive heart disease with heart failure (HCC)    PLAN:    In order of problems listed above:  John Kemp is doing quite well recently maintaining sinus rhythm on low-dose amiodarone without toxicity and also continue his current anticoagulant. No recurrence of symptomatic bradycardia Stable CAD no anginal discomfort changes current medical treatment including anticoagulant and his high intensity statin Well compensated no edema continue his current loop diuretic   Next appointment: 6 months   Medication Adjustments/Labs and Tests Ordered: Current medicines are reviewed at length with the patient today.  Concerns regarding medicines are outlined above.  Orders Placed This Encounter  Procedures   EKG 12-Lead   No orders of the defined types were placed in this encounter.    History of Present Illness:    John Kemp is a 77 y.o. male with a hx of paroxysmal atrial fibrillation maintaining sinus rhythm with low-dose amiodarone bradycardia avoiding AV blocking medication chronic anticoagulation left bundle branch block hypertensive heart disease with diastolic heart failure CAD and hyper lipidemia last seen 11/03/2002.   Compliance with diet, lifestyle and medications: Yes  He continues in wellness cardiac rehabilitation has had a marked improvement in his sense of wellbeing says is the best he has felt in years and presently is not having angina edema shortness of breath palpitation or syncope His LDL is optimized on lipid-lowering therapy He has had no bleeding from his anticoagulant he tolerates his high intensity statin without muscle pain or  weakness. He has had no recurrent atrial arrhythmia with low-dose amiodarone Past Medical History:  Diagnosis Date   A-fib (HCC)    Acquired thrombophilia (HCC) 11/17/2019   Acute bronchitis due to other specified organisms 10/28/2021   Acute cough 10/20/2021   Acute non-recurrent maxillary sinusitis 10/28/2021   Anticoagulated 01/21/2015   Atherosclerotic heart disease of native coronary artery without angina pectoris    Atrial fibrillation with rapid ventricular response (HCC) 11/12/2021   Atrial flutter with rapid ventricular response (HCC) 11/12/2021   CAD in native artery 01/21/2015   Cancer (HCC)    skin cancer   CHF (congestive heart failure) (HCC) 11/05/2019   Chronic diastolic (congestive) heart failure (HCC)    Chronic diastolic heart failure (HCC) 01/21/2015   Last Assessment & Plan:  Is congestive heart failure is mildly decompensated he is edematous he'll increase the dose of his diuretic today the lab work performed including an ARB referred to care transitions for home care he'll follow-up in my office with me in 4-6 weeks daily weights sodium restrictionand compliance of medications beinghemoglobin of will   Chronic obstructive pulmonary disease (COPD) (HCC)    Coronary artery disease involving autologous artery coronary bypass graft 11/12/2021   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 11/17/2019   Dizziness 11/12/2021   Dyslipidemia 11/12/2021   Elevated brain natriuretic peptide (BNP) level 11/12/2021   Essential hypertension 01/21/2015   right superior quadrantanopsia   Hearing loss of right ear due to cerumen impaction 10/20/2021   High cholesterol 10/20/2017   Hx of CABG 03/02/2017   Hypertension    Hypertensive heart disease

## 2023-06-20 ENCOUNTER — Ambulatory Visit: Payer: Medicare Other | Attending: Cardiology | Admitting: Cardiology

## 2023-06-20 VITALS — BP 122/74 | HR 55 | Ht 70.0 in | Wt 273.8 lb

## 2023-06-20 DIAGNOSIS — I110 Hypertensive heart disease with heart failure: Secondary | ICD-10-CM

## 2023-06-20 DIAGNOSIS — I480 Paroxysmal atrial fibrillation: Secondary | ICD-10-CM | POA: Diagnosis not present

## 2023-06-20 DIAGNOSIS — I2510 Atherosclerotic heart disease of native coronary artery without angina pectoris: Secondary | ICD-10-CM

## 2023-06-20 DIAGNOSIS — R001 Bradycardia, unspecified: Secondary | ICD-10-CM

## 2023-06-20 NOTE — Patient Instructions (Signed)
Medication Instructions:  Your physician recommends that you continue on your current medications as directed. Please refer to the Current Medication list given to you today.  *If you need a refill on your cardiac medications before your next appointment, please call your pharmacy*   Lab Work: None If you have labs (blood work) drawn today and your tests are completely normal, you will receive your results only by: MyChart Message (if you have MyChart) OR A paper copy in the mail If you have any lab test that is abnormal or we need to change your treatment, we will call you to review the results.   Testing/Procedures: None   Follow-Up: At [facility], you and your health needs are our priority.  As part of our continuing mission to provide you with exceptional heart care, we have created designated Provider Care Teams.  These Care Teams include your primary Cardiologist (physician) and Advanced Practice Providers (APPs -  Physician Assistants and Nurse Practitioners) who all work together to provide you with the care you need, when you need it.  We recommend signing up for the patient portal called "MyChart".  Sign up information is provided on this After Visit Summary.  MyChart is used to connect with patients for Virtual Visits (Telemedicine).  Patients are able to view lab/test results, encounter notes, upcoming appointments, etc.  Non-urgent messages can be sent to your provider as well.   To learn more about what you can do with MyChart, go to [email or web address].    Your next appointment:   6 month(s)  Provider:   Brian Munley, MD    Other Instructions None  

## 2023-07-19 ENCOUNTER — Other Ambulatory Visit: Payer: Self-pay | Admitting: Family Medicine

## 2023-07-20 DIAGNOSIS — L578 Other skin changes due to chronic exposure to nonionizing radiation: Secondary | ICD-10-CM | POA: Diagnosis not present

## 2023-07-20 DIAGNOSIS — L570 Actinic keratosis: Secondary | ICD-10-CM | POA: Diagnosis not present

## 2023-07-20 DIAGNOSIS — C441121 Basal cell carcinoma of skin of right upper eyelid, including canthus: Secondary | ICD-10-CM | POA: Diagnosis not present

## 2023-07-20 DIAGNOSIS — L820 Inflamed seborrheic keratosis: Secondary | ICD-10-CM | POA: Diagnosis not present

## 2023-07-20 DIAGNOSIS — L821 Other seborrheic keratosis: Secondary | ICD-10-CM | POA: Diagnosis not present

## 2023-07-23 NOTE — Assessment & Plan Note (Signed)
Check lab

## 2023-07-23 NOTE — Assessment & Plan Note (Signed)
Management per specialist. Taking Eliquis 5 mg twice a day, Amiodarone 200 mg daily.

## 2023-07-23 NOTE — Progress Notes (Unsigned)
Subjective:  Patient ID: John Kemp, male    DOB: [birth year]  Age: 77 y.o. MRN: [id number]  Chief Complaint  Patient presents with   Medical Management of Chronic Issues    HPI The patient, with a history of atrial fibrillation, hyperlipidemia, hypothyroidism, benign prostatic hyperplasia (BPH), and hypertension, reports no new health issues. He is currently on amiodarone, Eliquis, atorvastatin, levothyroxine, Flomax, finasteride, valsartan, amlodipine, and Ambien. He has stopped taking B12 injections due to previously elevated levels and is not taking any over-the-counter B12 supplements. He also reports not taking prescribed vitamin D and finerenone.  The patient has been experiencing insomnia and is dependent on Ambien for sleep. He expresses a preference for a 90-day prescription instead of the current 30-day one due to the inconvenience of frequent refills.  He is participating in a wellness program at the hospital three days a week, which has improved his leg strength and overall wellness. He is not adhering to a healthy diet despite recommendations from the wellness program.  The patient is also on torsemide 20 mg daily. He is not taking potassium regularly. He is not experiencing any muscle cramps. He has been prescribed two nasal sprays, Dymista and Flonase, which he uses occasionally. His nasal sprays are redundant, but he is not taking regularly anyway/.      07/24/2023    8:33 AM 07/24/2023    8:03 AM 01/11/2023    8:28 AM 09/12/2022    9:53 AM 09/06/2022   11:37 AM  Depression screen PHQ 2/9  Decreased Interest 0 0 0 0 0  Down, Depressed, Hopeless 0 0 0 0 0  PHQ - 2 Score 0 0 0 0 0  Altered sleeping   0    Tired, decreased energy   0    Change in appetite   0    Feeling bad or failure about yourself    0    Trouble concentrating   0    Moving slowly or fidgety/restless   0    Suicidal thoughts   0    PHQ-9 Score   0    Difficult doing work/chores   Not difficult  at all          07/24/2023    8:33 AM  Fall Risk   Falls in the past year? 0  Number falls in past yr: 0  Injury with Fall? 0  Risk for fall due to : No Fall Risks  Follow up Falls evaluation completed;Education provided    Patient Care Team: Blane Ohara, MD as PCP - General (Family Medicine) Zettie Pho, [facility] (Inactive) as Pharmacist (Pharmacist) Christinia Gully, AUD (Audiology) Baldo Daub, MD as Consulting Physician (Cardiology)   Review of Systems  Constitutional:  Negative for chills, fatigue and fever.  HENT:  Negative for congestion, ear pain and sore throat.   Respiratory:  Negative for cough and shortness of breath.   Cardiovascular:  Negative for chest pain.  Gastrointestinal:  Negative for abdominal pain, constipation, diarrhea, nausea and vomiting.  Endocrine: Negative for polydipsia, polyphagia and polyuria.  Genitourinary:  Negative for dysuria and frequency.  Musculoskeletal:  Negative for arthralgias and myalgias.  Neurological:  Negative for dizziness and headaches.  Psychiatric/Behavioral:  Negative for dysphoric mood.        No dysphoria    Current Outpatient Medications on File Prior to Visit  Medication Sig Dispense Refill   acetaminophen (TYLENOL) 500 MG tablet Take 500 mg by mouth 2 (two) times daily.  amiodarone (PACERONE) 200 MG tablet Take 1 tablet (200 mg total) by mouth daily. 90 tablet 2   amLODipine (NORVASC) 5 MG tablet TAKE 1 TABLET (5 MG TOTAL) BY MOUTH DAILY. 90 tablet 1   apixaban (ELIQUIS) 5 MG TABS tablet Take 1 tablet (5 mg total) by mouth 2 (two) times daily. 180 tablet 1   atorvastatin (LIPITOR) 20 MG tablet TAKE 1 TABLET BY MOUTH EVERY DAY 90 tablet 1   cyanocobalamin (VITAMIN B12) 1000 MCG/ML injection INJECT 1 ML (1,000 MCG TOTAL) INTO THE MUSCLE ONCE A WEEK. INJECT 1 ML ONCE A WEEK FOR 4 WEEKS 12 mL 1   ELIQUIS 5 MG TABS tablet TAKE 1 TABLET BY MOUTH TWICE A DAY (Patient taking differently: Take 5 mg by mouth 2 (two)  times daily.) 60 tablet 5   finasteride (PROSCAR) 5 MG tablet Take 1 tablet (5 mg total) by mouth daily. 90 tablet 1   fluticasone (FLONASE) 50 MCG/ACT nasal spray Place 2 sprays into both nostrils daily. Use in addition to azelastine. 16 g 6   Inulin (FIBER CHOICE PO) Take 3 tablets by mouth daily as needed (Constipation).     ketoconazole (NIZORAL) 2 % shampoo Apply 1 Application topically 3 (three) times a week.     levothyroxine (SYNTHROID) 112 MCG tablet TAKE 1 TABLET BY MOUTH EVERY DAY 90 tablet 0   nystatin cream (MYCOSTATIN) Apply 1 Application topically 2 (two) times daily.     SYRINGE-NEEDLE, DISP, 3 ML (BD ECLIPSE SYRINGE/NEEDLE) 25G X 5/8" 3 ML MISC Inject 1 ml of B12 every week for 4 weeks 50 each 0   tamsulosin (FLOMAX) 0.4 MG CAPS capsule TAKE 1 CAPSULE BY MOUTH EVERY DAY 90 capsule 1   torsemide (DEMADEX) 20 MG tablet TAKE 1 TABLET BY MOUTH EVERY DAY 90 tablet 0   triamcinolone cream (KENALOG) 0.1 % Apply 1 Application topically 2 (two) times daily.     valsartan (DIOVAN) 320 MG tablet TAKE 1 TABLET BY MOUTH EVERY DAY 90 tablet 1   Vitamin D, Ergocalciferol, (DRISDOL) 1.25 MG (50000 UNIT) CAPS capsule Take 1 capsule (50,000 Units total) by mouth every 7 (seven) days. 12 capsule 2   No current facility-administered medications on file prior to visit.   Past Medical History:  Diagnosis Date   A-fib (HCC)    Acquired thrombophilia (HCC) 11/17/2019   Acute bronchitis due to other specified organisms 10/28/2021   Acute cough 10/20/2021   Acute non-recurrent maxillary sinusitis 10/28/2021   Anticoagulated 01/21/2015   Atherosclerotic heart disease of native coronary artery without angina pectoris    Atrial fibrillation with rapid ventricular response (HCC) 11/12/2021   Atrial flutter with rapid ventricular response (HCC) 11/12/2021   CAD in native artery 01/21/2015   Cancer (HCC)    skin cancer   CHF (congestive heart failure) (HCC) 11/05/2019   Chronic diastolic (congestive)  heart failure (HCC)    Chronic diastolic heart failure (HCC) 01/21/2015   Last Assessment & Plan:  Is congestive heart failure is mildly decompensated he is edematous he'll increase the dose of his diuretic today the lab work performed including an ARB referred to care transitions for home care he'll follow-up in my office with me in 4-6 weeks daily weights sodium restrictionand compliance of medications beinghemoglobin of will   Chronic obstructive pulmonary disease (COPD) (HCC)    Coronary artery disease involving autologous artery coronary bypass graft 11/12/2021   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 11/17/2019   Dizziness 11/12/2021   Dyslipidemia  11/12/2021   Elevated brain natriuretic peptide (BNP) level 11/12/2021   Essential hypertension 01/21/2015   right superior quadrantanopsia   Hearing loss of right ear due to cerumen impaction 10/20/2021   High cholesterol 10/20/2017   Hx of CABG 03/02/2017   Hypertension    Hypertensive heart disease with heart failure (HCC)    Hypothyroidism (acquired) 04/24/2018   Impaired fasting glucose    Left rotator cuff tear 11/12/2021   Mixed hyperlipidemia 11/17/2019   Myocardial infarction (HCC)    Non-seasonal allergic rhinitis due to pollen 10/20/2021   On amiodarone therapy 01/21/2015   PAF (paroxysmal atrial fibrillation) (HCC)    Primary insomnia    Sequela of cardioembolic stroke 11/17/2019   Severe obesity with body mass index (BMI) of 36.0 to 36.9 with serious comorbidity (HCC)    Squamous cell carcinoma 11/05/2019   Typical atrial flutter (HCC) 01/21/2015   Vitamin D deficiency, unspecified    Past Surgical History:  Procedure Laterality Date   APPENDECTOMY  1956   CARDIAC CATHETERIZATION     CARDIOVERSION  2002   CHOLECYSTECTOMY  2006   CORONARY ARTERY BYPASS GRAFT  2001   HERNIA REPAIR     RADIOLOGY WITH ANESTHESIA Bilateral 01/18/2018   Procedure: MRI WITH ANESTHESIA LUMBAR SPINE WITH AND WITHOUT CONTRAST;   Surgeon: Radiologist, Medication, MD;  Location: [facility] OR;  Service: Radiology;  Laterality: Bilateral;   RADIOLOGY WITH ANESTHESIA N/A 04/19/2022   Procedure: MRI WITH BRAIN WITH AND WITHOUT CONTRAST;  Surgeon: Radiologist, Medication, MD;  Location: [facility] OR;  Service: Radiology;  Laterality: N/A;   ROTATOR CUFF REPAIR Left    TONSILLECTOMY  1975    Family History  Problem Relation Age of Onset   Heart disease Father    Arrhythmia Father    Breast cancer Mother    Diabetes type II Sister    Diabetes Brother    Social History   Socioeconomic History   Marital status: Married    Spouse name: Darl Pikes   Number of children: 2   Years of education: Not on file   Highest education level: Not on file  Occupational History   Occupation: Retired  Tobacco Use   Smoking status: Former    Current packs/day: 0.00    Average packs/day: 1 pack/day for 15.0 years (15.0 ttl pk-yrs)    Types: Cigarettes    Start date: 08/30/1963    Quit date: 08/29/1978    Years since quitting: 44.9    Passive exposure: Past   Smokeless tobacco: Never  Vaping Use   Vaping status: Never Used  Substance and Sexual Activity   Alcohol use: No    Alcohol/week: 0.0 standard drinks of alcohol   Drug use: No   Sexual activity: Not Currently  Other Topics Concern   Not on file  Social History Narrative   Not on file   Social Determinants of Health   Financial Resource Strain: High Risk (01/28/2022)   Overall Financial Resource Strain (CARDIA)    Difficulty of Paying Living Expenses: Hard  Food Insecurity: No Food Insecurity (07/24/2023)   Hunger Vital Sign    Worried About Running Out of Food in the Last Year: Never true    Ran Out of Food in the Last Year: Never true  Transportation Needs: No Transportation Needs (09/09/2022)   PRAPARE - Administrator, Civil Service (Medical): No    Lack of Transportation (Non-Medical): No  Physical Activity: Not on file  Stress: Not on file  Social  Connections: Not  on file    Objective:  BP 126/68   Pulse (!) 52   Ht 5\' 10"  (1.778 m)   Wt 234 lb (106.1 kg)   SpO2 96%   BMI 33.58 kg/m      07/24/2023    7:53 AM 06/20/2023   10:25 AM 01/11/2023    8:15 AM  BP/Weight  Systolic BP 126 122 100  Diastolic BP 68 74 60  Wt. (Lbs) 234 273.8 238  BMI 33.58 kg/m2 39.29 kg/m2 34.15 kg/m2    Physical Exam Vitals reviewed.  Constitutional:      Appearance: Normal appearance. He is obese.  Neck:     Vascular: No carotid bruit.  Cardiovascular:     Rate and Rhythm: Normal rate and regular rhythm.     Heart sounds: Normal heart sounds.  Pulmonary:     Effort: Pulmonary effort is normal.     Breath sounds: Normal breath sounds. No wheezing, rhonchi or rales.  Abdominal:     General: Bowel sounds are normal.     Palpations: Abdomen is soft.     Tenderness: There is no abdominal tenderness.  Musculoskeletal:     Right lower leg: No edema.     Left lower leg: No edema.  Neurological:     Mental Status: He is alert.  Psychiatric:        Mood and Affect: Mood normal.        Behavior: Behavior normal.     Diabetic Foot Exam - Simple   No data filed      Lab Results  Component Value Date   WBC 5.5 01/11/2023   HGB 13.5 01/11/2023   HCT 39.3 01/11/2023   PLT 202 01/11/2023   GLUCOSE 92 01/11/2023   CHOL 142 01/11/2023   TRIG 89 01/11/2023   HDL 44 01/11/2023   LDLCALC 81 01/11/2023   ALT 17 01/11/2023   AST 22 01/11/2023   NA 143 01/11/2023   K 4.2 01/11/2023   CL 102 01/11/2023   CREATININE 1.42 (H) 01/11/2023   BUN 19 01/11/2023   CO2 26 01/11/2023   TSH 4.330 01/11/2023   INR 1.1 02/02/2022   HGBA1C 5.4 06/03/2022   MICROALBUR Positive 150 01/23/2019      Assessment & Plan:  Hypertensive heart disease with heart failure [facility]) Assessment & Plan: Well controlled.  No changes to medicines. Takes Amlodipine 5 mg daily and Valsartan 1 tablet 320 mg daily. Patient wears compression socks daily and takes Torsemide 20mg   daily. -Continue current regimen. Continue to work on eating a healthy diet and exercise.  Continue cardiopulmonary rehab. Labs drawn today.   Management per specialist.  Cardiology  Orders: -     CBC with Differential/Platelet -     Comprehensive metabolic panel  Persistent atrial fibrillation (HCC) Assessment & Plan: Management per specialist. Taking Eliquis 5 mg twice a day, Amiodarone 200 mg daily.    Mixed hyperlipidemia Assessment & Plan: Well controlled.  No changes to medicines. Atorvastatin 20 mg every day  Continue to work on eating a healthy diet and exercise.  Labs drawn today.    Orders: -     Lipid panel  B12 deficiency Assessment & Plan: Check lab.   Orders: -     Vitamin B12 -     Methylmalonic acid, serum  Acquired hypothyroidism Assessment & Plan: Previously well controlled Continue Synthroid at current dose  Recheck TSH and adjust Synthroid as indicated     Atherosclerosis of  native coronary artery of native heart without angina pectoris Assessment & Plan: Continue statin, ntg.  Management per specialist.    Orders: -     Nitroglycerin; Place 1 tablet (0.4 mg total) under the tongue every 5 (five) minutes as needed for chest pain.  Dispense: 25 tablet; Refill: 2  Primary insomnia -     Zolpidem Tartrate; Take 1 tablet (10 mg total) by mouth at bedtime.  Dispense: 90 tablet; Refill: 1  Need for vaccination -     Pfizer Comirnaty Covid-19 Vaccine 55yrs & older  Hypertensive heart and kidney disease with chronic diastolic congestive heart failure and stage 3 chronic kidney disease, unspecified whether stage 3a or 3b CKD (HCC)  Stage 3a chronic kidney disease (HCC) Assessment & Plan: stable    General Health Maintenance -Administer COVID-19 booster vaccine today. -Recommend patient receive Shingles vaccine at pharmacy. -Check Potassium levels today.   Meds ordered this encounter  Medications   nitroGLYCERIN (NITROSTAT) 0.4 MG SL  tablet    Sig: Place 1 tablet (0.4 mg total) under the tongue every 5 (five) minutes as needed for chest pain.    Dispense:  25 tablet    Refill:  2   zolpidem (AMBIEN) 10 MG tablet    Sig: Take 1 tablet (10 mg total) by mouth at bedtime.    Dispense:  90 tablet    Refill:  1    This prescription was filled on 03/01/2023. Any refills authorized will be placed on file.    Orders Placed This Encounter  Procedures   Pfizer Comirnaty Covid -19 Vaccine 84yrs and older   Vitamin B12   CBC with Differential/Platelet   Comprehensive metabolic panel   Lipid panel   Methylmalonic acid, serum     Follow-up: Return in about 3 months (around 10/24/2023) for chronic follow up.   I,Antoinette Haskett,acting as a Neurosurgeon for Blane Ohara, MD.,have documented all relevant documentation on the behalf of Blane Ohara, MD,as directed by  Blane Ohara, MD while in the presence of Blane Ohara, MD.   An After Visit Summary was printed and given to the patient.  Blane Ohara, MD [facility] [phone]

## 2023-07-23 NOTE — Assessment & Plan Note (Signed)
Well controlled.  No changes to medicines. Takes Amlodipine 5 mg daily and Valsartan 1 tablet 320 mg daily. Patient wears compression socks daily and takes Torsemide 20mg  daily. -Continue current regimen. Continue to work on eating a healthy diet and exercise.  Continue cardiopulmonary rehab. Labs drawn today.   Management per specialist.  Cardiology

## 2023-07-23 NOTE — Assessment & Plan Note (Signed)
Well controlled.  No changes to medicines. Atorvastatin 20 mg every day  Continue to work on eating a healthy diet and exercise.  Labs drawn today.

## 2023-07-23 NOTE — Assessment & Plan Note (Signed)
Previously well controlled Continue Synthroid at current dose  Recheck TSH and adjust Synthroid as indicated

## 2023-07-23 NOTE — Assessment & Plan Note (Signed)
Hemoglobin A1c 5.4%, 3 month avg of blood sugars, is in prediabetic range.  In order to prevent progression to diabetes, recommend low carb diet and regular exercise

## 2023-07-24 ENCOUNTER — Ambulatory Visit (INDEPENDENT_AMBULATORY_CARE_PROVIDER_SITE_OTHER): Payer: Medicare Other | Admitting: Family Medicine

## 2023-07-24 ENCOUNTER — Encounter: Payer: Self-pay | Admitting: Family Medicine

## 2023-07-24 VITALS — BP 126/68 | HR 52 | Ht 70.0 in | Wt 234.0 lb

## 2023-07-24 DIAGNOSIS — I5032 Chronic diastolic (congestive) heart failure: Secondary | ICD-10-CM

## 2023-07-24 DIAGNOSIS — E039 Hypothyroidism, unspecified: Secondary | ICD-10-CM | POA: Diagnosis not present

## 2023-07-24 DIAGNOSIS — E782 Mixed hyperlipidemia: Secondary | ICD-10-CM | POA: Diagnosis not present

## 2023-07-24 DIAGNOSIS — N183 Chronic kidney disease, stage 3 unspecified: Secondary | ICD-10-CM

## 2023-07-24 DIAGNOSIS — I4819 Other persistent atrial fibrillation: Secondary | ICD-10-CM | POA: Diagnosis not present

## 2023-07-24 DIAGNOSIS — R7301 Impaired fasting glucose: Secondary | ICD-10-CM

## 2023-07-24 DIAGNOSIS — E538 Deficiency of other specified B group vitamins: Secondary | ICD-10-CM

## 2023-07-24 DIAGNOSIS — I110 Hypertensive heart disease with heart failure: Secondary | ICD-10-CM

## 2023-07-24 DIAGNOSIS — Z23 Encounter for immunization: Secondary | ICD-10-CM | POA: Diagnosis not present

## 2023-07-24 DIAGNOSIS — I130 Hypertensive heart and chronic kidney disease with heart failure and stage 1 through stage 4 chronic kidney disease, or unspecified chronic kidney disease: Secondary | ICD-10-CM | POA: Insufficient documentation

## 2023-07-24 DIAGNOSIS — F5101 Primary insomnia: Secondary | ICD-10-CM | POA: Diagnosis not present

## 2023-07-24 DIAGNOSIS — N1831 Chronic kidney disease, stage 3a: Secondary | ICD-10-CM | POA: Diagnosis not present

## 2023-07-24 DIAGNOSIS — I2510 Atherosclerotic heart disease of native coronary artery without angina pectoris: Secondary | ICD-10-CM | POA: Diagnosis not present

## 2023-07-24 HISTORY — DX: Chronic kidney disease, stage 3a: N18.31

## 2023-07-24 HISTORY — DX: Chronic kidney disease, stage 3 unspecified: N18.30

## 2023-07-24 MED ORDER — NITROGLYCERIN 0.4 MG SL SUBL: 0.4000 mg | SUBLINGUAL_TABLET | SUBLINGUAL | 2 refills | Status: AC | PRN

## 2023-07-24 MED ORDER — ZOLPIDEM TARTRATE 10 MG PO TABS: 10.0000 mg | ORAL_TABLET | Freq: Every day | ORAL | 1 refills | Status: DC

## 2023-07-24 NOTE — Assessment & Plan Note (Signed)
stable °

## 2023-07-24 NOTE — Assessment & Plan Note (Signed)
Continue statin, ntg.  Management per specialist.

## 2023-07-24 NOTE — Patient Instructions (Addendum)
VISIT SUMMARY:  During today's visit, we reviewed your current health status and medications. You reported no new health issues and discussed your ongoing treatment for various conditions. We also addressed your insomnia and your preference for a longer prescription duration for Ambien. Additionally, we discussed your participation in a wellness program and your current medication adherence.  YOUR PLAN:  -ATRIAL FIBRILLATION: Atrial fibrillation is an irregular and often rapid heart rate that can increase the risk of strokes and other heart-related complications. Your condition is stable on your current medications, Amiodarone and Eliquis. Please continue with your current regimen.  -HYPERLIPIDEMIA: Hyperlipidemia is the presence of high levels of fats (lipids) in the blood, which can increase the risk of heart disease. Your condition is stable on Atorvastatin. Please continue with your current regimen.  -VITAMIN B12 DEFICIENCY: Vitamin B12 deficiency occurs when the body does not have enough of this essential vitamin, which is important for nerve function and the production of red blood cells. We will check your B12 levels today to ensure they are within a normal range.  -HYPOTHYROIDISM: Hypothyroidism is a condition where the thyroid gland does not produce enough thyroid hormone, leading to a slow metabolism. Your condition is stable on Levothyroxine. Please continue with your current regimen.  -BENIGN PROSTATIC HYPERPLASIA (BPH): BPH is an enlarged prostate gland that can cause urinary problems. Your condition is stable on Flomax and Finasteride. Please continue with your current regimen.  -HYPERTENSION: Hypertension is high blood pressure, which can lead to serious health issues if not managed. Your condition is stable on Valsartan and Amlodipine. Please continue with your current regimen.  -ANGINA: Angina is chest pain caused by reduced blood flow to the heart muscles. Although you have not  needed to use your nitroglycerin, we will refill your prescription.  -VITAMIN D DEFICIENCY: Vitamin D deficiency occurs when the body does not have enough vitamin D, which is important for bone health. We will check your Vitamin D levels today to ensure they are within a normal range.  -INSOMNIA: Insomnia is difficulty falling or staying asleep. You are currently dependent on Ambien for sleep. We will refill your Ambien prescription for 90 days as requested.  -EDEMA: Edema is swelling caused by excess fluid trapped in the body's tissues. You are managing this with compression socks and Torsemide. Please continue with your current regimen.  -GENERAL HEALTH MAINTENANCE: We will administer your COVID-19 booster vaccine today. It is also recommended that you receive the Shingles vaccine at the pharmacy. We will check your Potassium levels today.  Patient to be scheduled with Dr. Myra Gianotti for repeat annual Carotid Ultrasound.  INSTRUCTIONS:  Please follow up with the lab for your B12 and Vitamin D levels. Ensure you receive the Shingles vaccine at the pharmacy. We will schedule a Carotid Ultrasound as recommended by your cardiologist. Continue with your current medications and lifestyle changes, and follow up with [place] as needed.

## 2023-07-27 LAB — COMPREHENSIVE METABOLIC PANEL
ALT: 15 [IU]/L (ref 0–44)
AST: 23 [IU]/L (ref 0–40)
Albumin: 4.4 g/dL (ref 3.8–4.8)
Alkaline Phosphatase: 113 [IU]/L (ref 44–121)
BUN/Creatinine Ratio: 11 (ref 10–24)
BUN: 15 mg/dL (ref 8–27)
Bilirubin Total: 0.9 mg/dL (ref 0.0–1.2)
CO2: 25 mmol/L (ref 20–29)
Calcium: 9.3 mg/dL (ref 8.6–10.2)
Chloride: 100 mmol/L (ref 96–106)
Creatinine, Ser: 1.34 mg/dL — ABNORMAL HIGH (ref 0.76–1.27)
Globulin, Total: 2.4 g/dL (ref 1.5–4.5)
Glucose: 87 mg/dL (ref 70–99)
Potassium: 4.4 mmol/L (ref 3.5–5.2)
Sodium: 141 mmol/L (ref 134–144)
Total Protein: 6.8 g/dL (ref 6.0–8.5)
eGFR: 55 mL/min/{1.73_m2} — ABNORMAL LOW (ref >59)

## 2023-07-27 LAB — LIPID PANEL
Chol/HDL Ratio: 3 {ratio} (ref 0.0–5.0)
Cholesterol, Total: 142 mg/dL (ref 100–199)
HDL: 48 mg/dL (ref >39)
LDL Chol Calc (NIH): 76 mg/dL (ref 0–99)
Triglycerides: 99 mg/dL (ref 0–149)
VLDL Cholesterol Cal: 18 mg/dL (ref 5–40)

## 2023-07-27 LAB — CBC WITH DIFFERENTIAL/PLATELET
Basophils Absolute: 0.1 10*3/uL (ref 0.0–0.2)
Basos: 1 %
EOS (ABSOLUTE): 0.2 10*3/uL (ref 0.0–0.4)
Eos: 3 %
Hematocrit: 42.4 % (ref 37.5–51.0)
Hemoglobin: 14.1 g/dL (ref 13.0–17.7)
Immature Grans (Abs): 0 10*3/uL (ref 0.0–0.1)
Immature Granulocytes: 0 %
Lymphocytes Absolute: 1.4 10*3/uL (ref 0.7–3.1)
Lymphs: 22 %
MCH: 31.4 pg (ref 26.6–33.0)
MCHC: 33.3 g/dL (ref 31.5–35.7)
MCV: 94 fL (ref 79–97)
Monocytes Absolute: 0.7 10*3/uL (ref 0.1–0.9)
Monocytes: 11 %
Neutrophils Absolute: 3.9 10*3/uL (ref 1.4–7.0)
Neutrophils: 63 %
Platelets: 215 10*3/uL (ref 150–450)
RBC: 4.49 x10E6/uL (ref 4.14–5.80)
RDW: 12.1 % (ref 11.6–15.4)
WBC: 6.2 10*3/uL (ref 3.4–10.8)

## 2023-07-27 LAB — METHYLMALONIC ACID, SERUM: Methylmalonic Acid: 803 nmol/L — ABNORMAL HIGH (ref 0–378)

## 2023-07-27 LAB — VITAMIN B12: Vitamin B-12: 274 pg/mL (ref 232–1245)

## 2023-07-31 ENCOUNTER — Other Ambulatory Visit: Payer: Self-pay

## 2023-07-31 ENCOUNTER — Telehealth: Payer: Self-pay

## 2023-07-31 DIAGNOSIS — E538 Deficiency of other specified B group vitamins: Secondary | ICD-10-CM

## 2023-07-31 MED ORDER — BD ECLIPSE SYRINGE/NEEDLE 25G X 5/8" 3 ML MISC: 0 refills | Status: AC

## 2023-07-31 MED ORDER — CYANOCOBALAMIN 1000 MCG/ML IJ SOLN: 1000.0000 ug | INTRAMUSCULAR | 0 refills | Status: DC

## 2023-07-31 NOTE — Telephone Encounter (Signed)
Patient came in office today with concerns of his labs, his mma, and kidney function. Please advise, I explain that provider has not yet had a chance to review results, but he will get a call once results are viewed by provider.

## 2023-07-31 NOTE — Telephone Encounter (Signed)
Sent labs to nurses. Dr Tobie Poet  ?

## 2023-08-18 ENCOUNTER — Other Ambulatory Visit: Payer: Self-pay | Admitting: Family Medicine

## 2023-08-18 DIAGNOSIS — N401 Enlarged prostate with lower urinary tract symptoms: Secondary | ICD-10-CM

## 2023-08-19 ENCOUNTER — Other Ambulatory Visit: Payer: Self-pay | Admitting: Cardiology

## 2023-08-19 ENCOUNTER — Other Ambulatory Visit: Payer: Self-pay | Admitting: Family Medicine

## 2023-08-19 DIAGNOSIS — I5032 Chronic diastolic (congestive) heart failure: Secondary | ICD-10-CM

## 2023-08-19 DIAGNOSIS — I110 Hypertensive heart disease with heart failure: Secondary | ICD-10-CM

## 2023-08-21 ENCOUNTER — Other Ambulatory Visit: Payer: Self-pay | Admitting: Family Medicine

## 2023-08-21 NOTE — Telephone Encounter (Signed)
Prescription sent to pharmacy.

## 2023-08-29 ENCOUNTER — Other Ambulatory Visit: Payer: Self-pay | Admitting: Family Medicine

## 2023-08-29 DIAGNOSIS — F5101 Primary insomnia: Secondary | ICD-10-CM

## 2023-09-11 ENCOUNTER — Ambulatory Visit: Payer: Medicare Other

## 2023-09-11 DIAGNOSIS — Z0000 Encounter for general adult medical examination without abnormal findings: Secondary | ICD-10-CM | POA: Diagnosis not present

## 2023-09-11 NOTE — Patient Instructions (Signed)
 [name] , Thank you for taking time to come for your Medicare Wellness Visit. I appreciate your ongoing commitment to your health goals. Please review the following plan we discussed and let me know if I can assist you in the future.    This is a list of the screening recommended for you and due dates:  Health Maintenance  Topic Date Due   Zoster (Shingles) Vaccine (1 of 2) Never done   Medicare Annual Wellness Visit  09/07/2023   COVID-19 Vaccine (5 - 2024-25 season) 09/18/2023   DTaP/Tdap/Td vaccine (2 - Td or Tdap) 04/30/2024   Colon Cancer Screening  11/05/2028   Pneumonia Vaccine  Completed   Flu Shot  Completed   Hepatitis C Screening  Completed   HPV Vaccine  Aged Out   You can get your RSV and Shingrix Vaccines at the pharmacy.  Once you meet 3% of your household income in out of pocket pharmacy expense you can apply for Eliquis  patient assistance.   Preventive Care 36 Years and Older, Male  Preventive care refers to lifestyle choices and visits with your health care provider that can promote health and wellness. What does preventive care include? A yearly physical exam. This is also called an annual well check. Dental exams once or twice a year. Routine eye exams. Ask your health care provider how often you should have your eyes checked. Personal lifestyle choices, including: Daily care of your teeth and gums. Regular physical activity. Eating a healthy diet. Avoiding tobacco and drug use. Limiting alcohol use. Practicing safe sex. Taking low doses of aspirin  every day. Taking vitamin and mineral supplements as recommended by your health care provider. What happens during an annual well check? The services and screenings done by your health care provider during your annual well check will depend on your age, overall health, lifestyle risk factors, and family history of disease. Counseling  Your health care provider may ask you questions about your: Alcohol  use. Tobacco use. Drug use. Emotional well-being. Home and relationship well-being. Sexual activity. Eating habits. History of falls. Memory and ability to understand (cognition). Work and work astronomer. Screening  You may have the following tests or measurements: Height, weight, and BMI. Blood pressure. Lipid and cholesterol levels. These may be checked every 5 years, or more frequently if you are over 76 years old. Skin check. Lung cancer screening. You may have this screening every year starting at age 76 if you have a 30-pack-year history of smoking and currently smoke or have quit within the past 15 years. Fecal occult blood test (FOBT) of the stool. You may have this test every year starting at age 76. Flexible sigmoidoscopy or colonoscopy. You may have a sigmoidoscopy every 5 years or a colonoscopy every 10 years starting at age 76. Prostate cancer screening. Recommendations will vary depending on your family history and other risks. Hepatitis C blood test. Hepatitis B blood test. Sexually transmitted disease (STD) testing. Diabetes screening. This is done by checking your blood sugar (glucose) after you have not eaten for a while (fasting). You may have this done every 1-3 years. Abdominal aortic aneurysm (AAA) screening. You may need this if you are a current or former smoker. Osteoporosis. You may be screened starting at age 76 if you are at high risk. Talk with your health care provider about your test results, treatment options, and if necessary, the need for more tests. Vaccines  Your health care provider may recommend certain vaccines, such as:  Influenza vaccine. This is recommended every year. Tetanus, diphtheria, and acellular pertussis (Tdap, Td) vaccine. You may need a Td booster every 10 years. Zoster vaccine. You may need this after age 76. Pneumococcal 13-valent conjugate (PCV13) vaccine. One dose is recommended after age 76. Pneumococcal polysaccharide  (PPSV23) vaccine. One dose is recommended after age 76. Talk to your health care provider about which screenings and vaccines you need and how often you need them. This information is not intended to replace advice given to you by your health care provider. Make sure you discuss any questions you have with your health care provider. Document Released: 09/11/2015 Document Revised: 05/04/2016 Document Reviewed: 06/16/2015 Elsevier Interactive Patient Education  2017 [employer].  Fall Prevention in the Home Falls can cause injuries. They can happen to people of all ages. There are many things you can do to make your home safe and to help prevent falls. What can I do on the outside of my home? Regularly fix the edges of walkways and driveways and fix any cracks. Remove anything that might make you trip as you walk through a door, such as a raised step or threshold. Trim any bushes or trees on the path to your home. Use bright outdoor lighting. Clear any walking paths of anything that might make someone trip, such as rocks or tools. Regularly check to see if handrails are loose or broken. Make sure that both sides of any steps have handrails. Any raised decks and porches should have guardrails on the edges. Have any leaves, snow, or ice cleared regularly. Use sand or salt on walking paths during winter. Clean up any spills in your garage right away. This includes oil or grease spills. What can I do in the bathroom? Use night lights. Install grab bars by the toilet and in the tub and shower. Do not use towel bars as grab bars. Use non-skid mats or decals in the tub or shower. If you need to sit down in the shower, use a plastic, non-slip stool. Keep the floor dry. Clean up any water that spills on the floor as soon as it happens. Remove soap buildup in the tub or shower regularly. Attach bath mats securely with double-sided non-slip rug tape. Do not have throw rugs and other things on the  floor that can make you trip. What can I do in the bedroom? Use night lights. Make sure that you have a light by your bed that is easy to reach. Do not use any sheets or blankets that are too big for your bed. They should not hang down onto the floor. Have a firm chair that has side arms. You can use this for support while you get dressed. Do not have throw rugs and other things on the floor that can make you trip. What can I do in the kitchen? Clean up any spills right away. Avoid walking on wet floors. Keep items that you use a lot in easy-to-reach places. If you need to reach something above you, use a strong step stool that has a grab bar. Keep electrical cords out of the way. Do not use floor polish or wax that makes floors slippery. If you must use wax, use non-skid floor wax. Do not have throw rugs and other things on the floor that can make you trip. What can I do with my stairs? Do not leave any items on the stairs. Make sure that there are handrails on both sides of the stairs and use them.  Fix handrails that are broken or loose. Make sure that handrails are as long as the stairways. Check any carpeting to make sure that it is firmly attached to the stairs. Fix any carpet that is loose or worn. Avoid having throw rugs at the top or bottom of the stairs. If you do have throw rugs, attach them to the floor with carpet tape. Make sure that you have a light switch at the top of the stairs and the bottom of the stairs. If you do not have them, ask someone to add them for you. What else can I do to help prevent falls? Wear shoes that: Do not have high heels. Have rubber bottoms. Are comfortable and fit you well. Are closed at the toe. Do not wear sandals. If you use a stepladder: Make sure that it is fully opened. Do not climb a closed stepladder. Make sure that both sides of the stepladder are locked into place. Ask someone to hold it for you, if possible. Clearly mark and make  sure that you can see: Any grab bars or handrails. First and last steps. Where the edge of each step is. Use tools that help you move around (mobility aids) if they are needed. These include: Canes. Walkers. Scooters. Crutches. Turn on the lights when you go into a dark area. Replace any light bulbs as soon as they burn out. Set up your furniture so you have a clear path. Avoid moving your furniture around. If any of your floors are uneven, fix them. If there are any pets around you, be aware of where they are. Review your medicines with your doctor. Some medicines can make you feel dizzy. This can increase your chance of falling. Ask your doctor what other things that you can do to help prevent falls. This information is not intended to replace advice given to you by your health care provider. Make sure you discuss any questions you have with your health care provider. Document Released: 06/11/2009 Document Revised: 01/21/2016 Document Reviewed: 09/19/2014 Elsevier Interactive Patient Education  2017 [employer].

## 2023-09-11 NOTE — Progress Notes (Signed)
 Subjective:   John Kemp is a 78 y.o. male who presents for Medicare Annual/Subsequent preventive examination.  This wellness visit is conducted by a nurse.  The patient's medications were reviewed and reconciled since the patient's last visit.  History details were provided by the patient.  The history appears to be reliable.    Medical History: Patient history and Family history was reviewed  Medications, Allergies, and preventative health maintenance was reviewed and updated.   Visit Complete: Virtual I connected with  John Kemp on 09/11/23 by a audio enabled telemedicine application and verified that I am speaking with the correct person using two identifiers.  Patient Location: Home  Provider Location: Office/Clinic  I discussed the limitations of evaluation and management by telemedicine. The patient expressed understanding and agreed to proceed.  Vital Signs: Because this visit was a virtual/telehealth visit, some criteria may be missing or patient reported. Any vitals not documented were not able to be obtained and vitals that have been documented are patient reported.   Cardiac Risk Factors include: advanced age (>2men, >50 women);male gender;obesity (BMI >30kg/m2)     Objective:    Today's Vitals   09/11/23 1350  PainSc: 0-No pain  Patient was unable to self-report due to a lack of equipment at home via telehealth.  Patient states that vital signs are taken three times a week before and after his wellness visit at [facility].  There is no height or weight on file to calculate BMI.     04/19/2022    7:16 AM 11/29/2021    3:47 AM 09/07/2021    4:19 PM 02/18/2021   11:50 AM 07/21/2020   11:04 AM 01/18/2018    8:22 AM 04/02/2017    9:23 AM  Advanced Directives  Does Patient Have a Medical Advance Directive? Yes No No Yes Yes Yes Yes  Type of Estate Agent of [place];Living will   Healthcare Power of [place];Living will Healthcare  Power of [place];Living will Living will;Healthcare Power of Attorney Living will  Does patient want to make changes to medical advance directive? No - Patient declined   No - Patient declined  No - Patient declined No - Patient declined  Copy of Healthcare Power of Attorney in Chart?    No - copy requested  No - copy requested   Would patient like information on creating a medical advance directive?  No - Patient declined         Current Medications (verified) Outpatient Encounter Medications as of 09/11/2023  Medication Sig   acetaminophen  (TYLENOL ) 500 MG tablet Take 500 mg by mouth 2 (two) times daily.   amiodarone  (PACERONE ) 200 MG tablet Take 1 tablet (200 mg total) by mouth daily.   amLODipine  (NORVASC ) 5 MG tablet TAKE 1 TABLET (5 MG TOTAL) BY MOUTH DAILY.   apixaban  (ELIQUIS ) 5 MG TABS tablet Take 1 tablet (5 mg total) by mouth 2 (two) times daily.   atorvastatin  (LIPITOR) 20 MG tablet TAKE 1 TABLET BY MOUTH EVERY DAY   cyanocobalamin  (VITAMIN B12) 1000 MCG/ML injection Inject 1 mL (1,000 mcg total) into the muscle every 30 (thirty) days.   ELIQUIS  5 MG TABS tablet TAKE 1 TABLET BY MOUTH TWICE A DAY (Patient taking differently: Take 5 mg by mouth 2 (two) times daily.)   finasteride  (PROSCAR ) 5 MG tablet Take 1 tablet (5 mg total) by mouth daily.   fluticasone  (FLONASE ) 50 MCG/ACT nasal spray Place 2 sprays into both nostrils daily. Use in  addition to azelastine .   Inulin (FIBER CHOICE PO) Take 3 tablets by mouth daily as needed (Constipation).   ketoconazole (NIZORAL) 2 % shampoo Apply 1 Application topically 3 (three) times a week.   levothyroxine  (SYNTHROID ) 112 MCG tablet TAKE 1 TABLET BY MOUTH EVERY DAY   nitroGLYCERIN  (NITROSTAT ) 0.4 MG SL tablet Place 1 tablet (0.4 mg total) under the tongue every 5 (five) minutes as needed for chest pain.   nystatin cream (MYCOSTATIN) Apply 1 Application topically 2 (two) times daily.   SYRINGE-NEEDLE, DISP, 3 ML (BD ECLIPSE SYRINGE/NEEDLE) 25G  X 5/8 3 ML MISC Inject 1 ml of B12 every week for 4 weeks   tamsulosin  (FLOMAX ) 0.4 MG CAPS capsule TAKE 1 CAPSULE BY MOUTH EVERY DAY   torsemide  (DEMADEX ) 20 MG tablet TAKE 1 TABLET BY MOUTH EVERY DAY   triamcinolone  cream (KENALOG ) 0.1 % Apply 1 Application topically 2 (two) times daily.   valsartan  (DIOVAN ) 320 MG tablet TAKE 1 TABLET BY MOUTH EVERY DAY   Vitamin D , Ergocalciferol , (DRISDOL ) 1.25 MG (50000 UNIT) CAPS capsule Take 1 capsule (50,000 Units total) by mouth every 7 (seven) days.   zolpidem  (AMBIEN ) 10 MG tablet Take 1 tablet (10 mg total) by mouth at bedtime.   No facility-administered encounter medications on file as of 09/11/2023.    Allergies (verified) Pravastatin , Prednisone , Diltiazem hcl, Ezetimibe, Moxifloxacin hcl, and Pravachol  [pravastatin  sodium]   History: Past Medical History:  Diagnosis Date   A-fib (HCC)    Acquired thrombophilia (HCC) 11/17/2019   Acute bronchitis due to other specified organisms 10/28/2021   Acute cough 10/20/2021   Acute non-recurrent maxillary sinusitis 10/28/2021   Anticoagulated 01/21/2015   Atherosclerotic heart disease of native coronary artery without angina pectoris    Atrial fibrillation with rapid ventricular response (HCC) 11/12/2021   Atrial flutter with rapid ventricular response (HCC) 11/12/2021   CAD in native artery 01/21/2015   Cancer (HCC)    skin cancer   CHF (congestive heart failure) (HCC) 11/05/2019   Chronic diastolic (congestive) heart failure (HCC)    Chronic diastolic heart failure (HCC) 01/21/2015   Last Assessment & Plan:  Is congestive heart failure is mildly decompensated he is edematous he'll increase the dose of his diuretic today the lab work performed including an ARB referred to care transitions for home care he'll follow-up in my office with me in 4-6 weeks daily weights sodium restrictionand compliance of medications beinghemoglobin of will   Chronic obstructive pulmonary disease (COPD) (HCC)     Coronary artery disease involving autologous artery coronary bypass graft 11/12/2021   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 11/17/2019   Dizziness 11/12/2021   Dyslipidemia 11/12/2021   Elevated brain natriuretic peptide (BNP) level 11/12/2021   Essential hypertension 01/21/2015   right superior quadrantanopsia   Hearing loss of right ear due to cerumen impaction 10/20/2021   High cholesterol 10/20/2017   Hx of CABG 03/02/2017   Hypertension    Hypertensive heart disease with heart failure (HCC)    Hypothyroidism (acquired) 04/24/2018   Impaired fasting glucose    Left rotator cuff tear 11/12/2021   Mixed hyperlipidemia 11/17/2019   Myocardial infarction (HCC)    Non-seasonal allergic rhinitis due to pollen 10/20/2021   On amiodarone  therapy 01/21/2015   PAF (paroxysmal atrial fibrillation) (HCC)    Primary insomnia    Sequela of cardioembolic stroke 11/17/2019   Severe obesity with body mass index (BMI) of 36.0 to 36.9 with serious comorbidity (HCC)    Squamous cell  carcinoma 11/05/2019   Typical atrial flutter (HCC) 01/21/2015   Vitamin D  deficiency, unspecified    Past Surgical History:  Procedure Laterality Date   APPENDECTOMY  1956   CARDIAC CATHETERIZATION     CARDIOVERSION  2002   CHOLECYSTECTOMY  2006   CORONARY ARTERY BYPASS GRAFT  2001   HERNIA REPAIR     RADIOLOGY WITH ANESTHESIA Bilateral 01/18/2018   Procedure: MRI WITH ANESTHESIA LUMBAR SPINE WITH AND WITHOUT CONTRAST;  Surgeon: Radiologist, Medication, MD;  Location: [facility] OR;  Service: Radiology;  Laterality: Bilateral;   RADIOLOGY WITH ANESTHESIA N/A 04/19/2022   Procedure: MRI WITH BRAIN WITH AND WITHOUT CONTRAST;  Surgeon: Radiologist, Medication, MD;  Location: [facility] OR;  Service: Radiology;  Laterality: N/A;   ROTATOR CUFF REPAIR Left    TONSILLECTOMY  1975   Family History  Problem Relation Age of Onset   Heart disease Father    Arrhythmia Father    Breast cancer Mother    Diabetes type II  Sister    Diabetes Brother    Social History   Socioeconomic History   Marital status: Married    Spouse name: Devere   Number of children: 2   Years of education: Not on file   Highest education level: Not on file  Occupational History   Occupation: Retired  Tobacco Use   Smoking status: Former    Current packs/day: 0.00    Average packs/day: 1 pack/day for 15.0 years (15.0 ttl pk-yrs)    Types: Cigarettes    Start date: 08/30/1963    Quit date: 08/29/1978    Years since quitting: 45.0    Passive exposure: Past   Smokeless tobacco: Never  Vaping Use   Vaping status: Never Used  Substance and Sexual Activity   Alcohol use: No    Alcohol/week: 0.0 standard drinks of alcohol   Drug use: No   Sexual activity: Not Currently  Other Topics Concern   Not on file  Social History Narrative   Not on file   Social Drivers of Health   Financial Resource Strain: Low Risk  (09/11/2023)   Overall Financial Resource Strain (CARDIA)    Difficulty of Paying Living Expenses: Not very hard  Food Insecurity: No Food Insecurity (09/11/2023)   Hunger Vital Sign    Worried About Running Out of Food in the Last Year: Never true    Ran Out of Food in the Last Year: Never true  Transportation Needs: No Transportation Needs (09/11/2023)   PRAPARE - Administrator, Civil Service (Medical): No    Lack of Transportation (Non-Medical): No  Physical Activity: Sufficiently Active (09/11/2023)   Exercise Vital Sign    Days of Exercise per Week: 4 days    Minutes of Exercise per Session: 50 min  Stress: No Stress Concern Present (09/11/2023)   [employer] of Occupational Health - Occupational Stress Questionnaire    Feeling of Stress : Not at all  Social Connections: Not on file    Tobacco Counseling Counseling given: Not Answered   Clinical Intake:  Pre-visit preparation completed: No  Pain : No/denies pain Pain Score: 0-No pain     BMI - recorded: 33.58 Nutritional  Status: BMI > 30  Obese Nutritional Risks: None Diabetes: No  How often do you need to have someone help you when you read instructions, pamphlets, or other written materials from your doctor or pharmacy?: 1 - Never  Interpreter Needed?: No      Activities of Daily Living  09/11/2023    1:54 PM  In your present state of health, do you have any difficulty performing the following activities:  Hearing? 1  Comment corrected with hearing aids  Vision? 0  Difficulty concentrating or making decisions? 0  Walking or climbing stairs? 0  Dressing or bathing? 0  Doing errands, shopping? 0  Preparing Food and eating ? N  Using the Toilet? N  In the past six months, have you accidently leaked urine? N  Do you have problems with loss of bowel control? N  Managing your Medications? N  Managing your Finances? N  Housekeeping or managing your Housekeeping? N    Patient Care Team: Sherre Clapper, MD as PCP - General (Family Medicine) Nyle Rankin POUR, [facility] (Inactive) as Pharmacist (Pharmacist) Jinx Grate, AUD (Audiology) Monetta Redell PARAS, MD as Consulting Physician (Cardiology)  Indicate any recent Medical Services you may have received from other than [facility] providers in the past year (date may be approximate).     Assessment:   This is a routine wellness examination for John Kemp.  Hearing/Vision screen No results found.   Goals Addressed   None    Depression Screen    09/11/2023    2:02 PM 07/24/2023    8:33 AM 07/24/2023    8:03 AM 01/11/2023    8:28 AM 09/12/2022    9:53 AM 09/06/2022   11:37 AM 03/29/2022    3:44 PM  PHQ 2/9 Scores  PHQ - 2 Score 0 0 0 0 0 0 0  PHQ- 9 Score    0     Exception Documentation   Patient refusal        Fall Risk    09/11/2023    1:52 PM 07/24/2023    8:33 AM 01/11/2023    8:28 AM 09/12/2022    9:53 AM 09/06/2022   11:36 AM  Fall Risk   Falls in the past year? 0 0 0 0 0  Number falls in past yr: 1 0 0 0 0  Injury with Fall? 0 0 0 0 0   Risk for fall due to : Impaired balance/gait No Fall Risks No Fall Risks No Fall Risks No Fall Risks  Follow up Falls evaluation completed;Education provided Falls evaluation completed;Education provided Education provided Falls evaluation completed Falls evaluation completed;Education provided    MEDICARE RISK AT HOME: Medicare Risk at Home Any stairs in or around the home?: No If so, are there any without handrails?: No Home free of loose throw rugs in walkways, pet beds, electrical cords, etc?: Yes Adequate lighting in your home to reduce risk of falls?: Yes Life alert?: No Use of a cane, walker or w/c?: No Grab bars in the bathroom?: Yes Shower chair or bench in shower?: No Elevated toilet seat or a handicapped toilet?: No  TIMED UP AND GO:  Was the test performed?  No    Cognitive Function:    04/23/2021    9:31 AM 09/25/2018   10:09 AM 06/20/2018    2:02 PM  MMSE - Mini Mental State Exam  Not completed:  --   Orientation to time 4 5 3   Orientation to Place 5 5 5   Registration 3 3 3   Attention/ Calculation 2 5 3   Recall 3 3 2   Language- name 2 objects 2 2 2   Language- repeat 1 1 1   Language- follow 3 step command 3 3 3   Language- read & follow direction 1 1 1   Write a sentence 1 1 1  Copy design 1 1 1   Total score 26 30 25         09/11/2023    2:06 PM 09/06/2022   11:37 AM  6CIT Screen  What Year? 0 points 0 points  What month? 0 points 0 points  What time? 0 points 0 points  Count back from 20 0 points 0 points  Months in reverse 0 points 0 points  Repeat phrase 0 points 0 points  Total Score 0 points 0 points    Immunizations Immunization History  Administered Date(s) Administered   Fluad Quad(high Dose 65+) 05/22/2020, 04/23/2021, 06/03/2022   Influenza Split 05/29/2014   Influenza-Unspecified 06/29/2018, 06/04/2019, 05/29/2023   Moderna SARS-COV2 Booster Vaccination 06/30/2020, 12/18/2020   Moderna Sars-Covid-2 Vaccination 10/25/2019, 11/27/2019    Pfizer(Comirnaty)Fall Seasonal Vaccine 12 years and older 08/18/2022, 07/24/2023   Pneumococcal Conjugate-13 07/12/2017   Pneumococcal Polysaccharide-23 05/29/2014, 07/19/2018   Tdap 04/30/2014    TDAP status: Up to date  Flu Vaccine status: Up to date  Pneumococcal vaccine status: Up to date  Covid-19 vaccine status: Completed vaccines  Qualifies for Shingles Vaccine? Yes   Zostavax completed No   Shingrix Completed?: No.    Education has been provided regarding the importance of this vaccine. Patient has been advised to call insurance company to determine out of pocket expense if they have not yet received this vaccine. Advised may also receive vaccine at local pharmacy or Health Dept. Verbalized acceptance and understanding.  Screening Tests Health Maintenance  Topic Date Due   Zoster Vaccines- Shingrix (1 of 2) Never done   Medicare Annual Wellness (AWV)  09/07/2023   COVID-19 Vaccine (5 - 2024-25 season) 09/18/2023   DTaP/Tdap/Td (2 - Td or Tdap) 04/30/2024   Colonoscopy  11/05/2028   Pneumonia Vaccine 31+ Years old  Completed   INFLUENZA VACCINE  Completed   Hepatitis C Screening  Completed   HPV VACCINES  Aged Out    Health Maintenance  Health Maintenance Due  Topic Date Due   Zoster Vaccines- Shingrix (1 of 2) Never done   Medicare Annual Wellness (AWV)  09/07/2023    Colorectal cancer screening: Type of screening: Colonoscopy.   Lung Cancer Screening: (Low Dose CT Chest recommended if Age 4-80 years, 20 pack-year currently smoking OR have quit w/in 15years.) does not qualify.   Additional Screening:  Vision Screening: Recommended annual ophthalmology exams for early detection of glaucoma and other disorders of the eye. Is the patient up to date with their annual eye exam?  Yes   Dental Screening: Recommended annual dental exams for proper oral hygiene   Community Resource Referral / Chronic Care Management: CRR required this visit?  No   CCM required  this visit?  No     Plan:     I have personally reviewed and noted the following in the patient's chart:   Medical and social history Use of alcohol, tobacco or illicit drugs  Current medications and supplements including opioid prescriptions.  Functional ability and status Nutritional status Physical activity Advanced directives List of other physicians Hospitalizations, surgeries, and ER visits in previous 12 months Vitals Screenings to include cognitive, depression, and falls Referrals and appointments  In addition, I have reviewed and discussed with patient certain preventive protocols, quality metrics, and best practice recommendations. A written personalized care plan for preventive services as well as general preventive health recommendations were provided to patient.     Suzen CHRISTELLA Sharps, LPN   [id number]   After Visit Summary: (MyChart) Due  to this being a telephonic visit, the after visit summary with patients personalized plan was offered to patient via MyChart

## 2023-09-20 ENCOUNTER — Other Ambulatory Visit: Payer: Self-pay

## 2023-09-20 DIAGNOSIS — I6523 Occlusion and stenosis of bilateral carotid arteries: Secondary | ICD-10-CM

## 2023-09-25 ENCOUNTER — Telehealth (HOSPITAL_BASED_OUTPATIENT_CLINIC_OR_DEPARTMENT_OTHER): Payer: Self-pay | Admitting: Emergency Medicine

## 2023-09-25 ENCOUNTER — Ambulatory Visit (HOSPITAL_BASED_OUTPATIENT_CLINIC_OR_DEPARTMENT_OTHER)
Admit: 2023-09-25 | Discharge: 2023-09-25 | Disposition: A | Discharge status: Home / Self Care | Payer: Medicare Other | Attending: Internal Medicine | Admitting: Internal Medicine

## 2023-09-25 ENCOUNTER — Ambulatory Visit (HOSPITAL_BASED_OUTPATIENT_CLINIC_OR_DEPARTMENT_OTHER)
Admission: EM | Admit: 2023-09-25 | Discharge: 2023-09-25 | Disposition: A | Discharge status: Home / Self Care | Payer: Medicare Other | Attending: Emergency Medicine | Admitting: Emergency Medicine

## 2023-09-25 ENCOUNTER — Other Ambulatory Visit: Payer: Self-pay

## 2023-09-25 DIAGNOSIS — M79661 Pain in right lower leg: Secondary | ICD-10-CM | POA: Diagnosis not present

## 2023-09-25 DIAGNOSIS — M25571 Pain in right ankle and joints of right foot: Secondary | ICD-10-CM

## 2023-09-25 DIAGNOSIS — M7989 Other specified soft tissue disorders: Secondary | ICD-10-CM | POA: Diagnosis not present

## 2023-09-25 DIAGNOSIS — M79604 Pain in right leg: Secondary | ICD-10-CM | POA: Diagnosis not present

## 2023-09-25 NOTE — Telephone Encounter (Signed)
Notified via phone of negative DVT study. Symptomatic management of lower extremity edema re-enforced with elevation, compression and frequent ambulation.

## 2023-09-25 NOTE — Discharge Instructions (Addendum)
Please wear compression stockings throughout the day.  Continue to take your medication as prescribed.  Elevate your extremities when able.  You can take 500 mg of Tylenol every 8 hours as needed for pain.  The results of the ultrasound will be available shortly and I will contact you if there is anything that needs follow-up.  If your symptoms persist please follow-up with your primary care provider for further advanced evaluation.

## 2023-09-25 NOTE — ED Triage Notes (Signed)
Patient states that his right ankle has been swollen for a while but it started hurting this past Saturday. No known injury. This is new for him.

## 2023-09-25 NOTE — ED Provider Notes (Signed)
[facility]    CSN: [id number] Arrival date & time: 09/25/23  4259      History   Chief Complaint Chief Complaint  Patient presents with   Ankle Pain    HPI John Kemp is a 78 y.o. male.   Patient presents to clinic for right lower leg pain that has been present since Saturday.  Pain from his right lateral ankle to lateral left lower leg. No calf pain. Over the past week the leg has been more swollen than the left but is just hard to get painful on Saturday.  He has not had any chest pain, shortness of breath or trouble breathing.  No recent trauma, injury or falls.  Wife is at bedside and reports he is sedentary and sits a lot. No recent travel. This is a new problem. Reports compliance with eliquis and home medications.   Does have a history of A-fib, is anticoagulated on Eliquis, hypertension, congestive heart failure COPD, hyperlipidemia, history of MI, on amiodarone therapy and acquired hypothyroidism.  The history is provided by the patient and medical records.  Ankle Pain   Past Medical History:  Diagnosis Date   A-fib (HCC)    Acquired thrombophilia (HCC) 11/17/2019   Acute bronchitis due to other specified organisms 10/28/2021   Acute cough 10/20/2021   Acute non-recurrent maxillary sinusitis 10/28/2021   Anticoagulated 01/21/2015   Atherosclerotic heart disease of native coronary artery without angina pectoris    Atrial fibrillation with rapid ventricular response (HCC) 11/12/2021   Atrial flutter with rapid ventricular response (HCC) 11/12/2021   CAD in native artery 01/21/2015   Cancer (HCC)    skin cancer   CHF (congestive heart failure) (HCC) 11/05/2019   Chronic diastolic (congestive) heart failure (HCC)    Chronic diastolic heart failure (HCC) 01/21/2015   Last Assessment & Plan:  Is congestive heart failure is mildly decompensated he is edematous he'll increase the dose of his diuretic today the lab work performed including an ARB  referred to care transitions for home care he'll follow-up in my office with me in 4-6 weeks daily weights sodium restrictionand compliance of medications beinghemoglobin of will   Chronic obstructive pulmonary disease (COPD) (HCC)    Coronary artery disease involving autologous artery coronary bypass graft 11/12/2021   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 11/17/2019   Dizziness 11/12/2021   Dyslipidemia 11/12/2021   Elevated brain natriuretic peptide (BNP) level 11/12/2021   Essential hypertension 01/21/2015   right superior quadrantanopsia   Hearing loss of right ear due to cerumen impaction 10/20/2021   High cholesterol 10/20/2017   Hx of CABG 03/02/2017   Hypertension    Hypertensive heart disease with heart failure (HCC)    Hypothyroidism (acquired) 04/24/2018   Impaired fasting glucose    Left rotator cuff tear 11/12/2021   Mixed hyperlipidemia 11/17/2019   Myocardial infarction (HCC)    Non-seasonal allergic rhinitis due to pollen 10/20/2021   On amiodarone therapy 01/21/2015   PAF (paroxysmal atrial fibrillation) (HCC)    Primary insomnia    Sequela of cardioembolic stroke 11/17/2019   Severe obesity with body mass index (BMI) of 36.0 to 36.9 with serious comorbidity (HCC)    Squamous cell carcinoma 11/05/2019   Typical atrial flutter (HCC) 01/21/2015   Vitamin D deficiency, unspecified     Patient Active Problem List   Diagnosis Date Noted   Need for vaccination 07/24/2023   Hypertensive heart and kidney disease with chronic diastolic congestive heart failure and  stage 3 chronic kidney disease, unspecified whether stage 3a or 3b CKD (HCC) 07/24/2023   Stage 3a chronic kidney disease (HCC) 07/24/2023   Anticoagulant long-term use 10/11/2022   Nasal septal deviation 10/11/2022   Nasal turbinate hypertrophy 10/11/2022   Need for hepatitis C screening test 09/18/2022   Encounter for immunization 08/23/2022   Pedal edema 07/05/2022   B12 deficiency  06/03/2022   Sensorineural hearing loss (SNHL) of right ear with unrestricted hearing of left ear 05/26/2022   Subjective tinnitus of right ear 05/26/2022   Neural foraminal stenosis of cervical spine 02/28/2022   Bilateral carotid artery stenosis 02/20/2022   Neck pain 02/20/2022   Benign prostatic hyperplasia with urinary frequency 12/24/2021   Physical deconditioning 12/07/2021   Persistent atrial fibrillation (HCC) 11/12/2021   Coronary artery disease involving autologous artery coronary bypass graft 11/12/2021   Dysequilibrium 11/12/2021   Left rotator cuff tear 11/12/2021   Non-seasonal allergic rhinitis due to pollen 10/20/2021   Hearing loss of right ear 10/20/2021   Sebaceous cyst 12/14/2020   Vitamin D deficiency, unspecified    Primary insomnia    Impaired fasting glucose    Hypertensive heart disease with heart failure (HCC)    Acquired thrombophilia (HCC) 11/17/2019   Sequela of cardioembolic stroke 11/17/2019   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 11/17/2019   Mixed hyperlipidemia 11/17/2019   Squamous cell carcinoma 11/05/2019   Acquired hypothyroidism 04/24/2018   Class 2 severe obesity due to excess calories with serious comorbidity and body mass index (BMI) of 35.0 to 35.9 in adult [facility])    Hx of CABG 03/02/2017   On amiodarone therapy 01/21/2015   Atherosclerotic heart disease of native coronary artery without angina pectoris 01/21/2015    Past Surgical History:  Procedure Laterality Date   APPENDECTOMY  1956   CARDIAC CATHETERIZATION     CARDIOVERSION  2002   CHOLECYSTECTOMY  2006   CORONARY ARTERY BYPASS GRAFT  2001   HERNIA REPAIR     RADIOLOGY WITH ANESTHESIA Bilateral 01/18/2018   Procedure: MRI WITH ANESTHESIA LUMBAR SPINE WITH AND WITHOUT CONTRAST;  Surgeon: Radiologist, Medication, MD;  Location: [facility] OR;  Service: Radiology;  Laterality: Bilateral;   RADIOLOGY WITH ANESTHESIA N/A 04/19/2022   Procedure: MRI WITH BRAIN WITH AND WITHOUT  CONTRAST;  Surgeon: Radiologist, Medication, MD;  Location: [facility] OR;  Service: Radiology;  Laterality: N/A;   ROTATOR CUFF REPAIR Left    TONSILLECTOMY  1975       Home Medications    Prior to Admission medications   Medication Sig Start Date End Date Taking? Authorizing Provider  acetaminophen (TYLENOL) 500 MG tablet Take 500 mg by mouth 2 (two) times daily.    [provider]  amiodarone (PACERONE) 200 MG tablet Take 1 tablet (200 mg total) by mouth daily. 01/04/23   Baldo Daub, MD  amLODipine (NORVASC) 5 MG tablet TAKE 1 TABLET (5 MG TOTAL) BY MOUTH DAILY. 08/21/23   Baldo Daub, MD  apixaban (ELIQUIS) 5 MG TABS tablet Take 1 tablet (5 mg total) by mouth 2 (two) times daily. 04/24/23   Cox, Fritzi Mandes, MD  atorvastatin (LIPITOR) 20 MG tablet TAKE 1 TABLET BY MOUTH EVERY DAY 06/03/23   Cox, Kirsten, MD  cyanocobalamin (VITAMIN B12) 1000 MCG/ML injection Inject 1 mL (1,000 mcg total) into the muscle every 30 (thirty) days. 07/31/23   Cox, Kirsten, MD  ELIQUIS 5 MG TABS tablet TAKE 1 TABLET BY MOUTH TWICE A DAY Patient taking differently: Take 5  mg by mouth 2 (two) times daily. 04/24/23   Windell Moment, MD  finasteride (PROSCAR) 5 MG tablet Take 1 tablet (5 mg total) by mouth daily. 12/31/21   Cox, Fritzi Mandes, MD  fluticasone (FLONASE) 50 MCG/ACT nasal spray Place 2 sprays into both nostrils daily. Use in addition to azelastine. 08/19/22   Cox, Fritzi Mandes, MD  Inulin (FIBER CHOICE PO) Take 3 tablets by mouth daily as needed (Constipation).    [provider]  ketoconazole (NIZORAL) 2 % shampoo Apply 1 Application topically 3 (three) times a week. 07/20/22   [provider]  levothyroxine (SYNTHROID) 112 MCG tablet TAKE 1 TABLET BY MOUTH EVERY DAY 08/29/23   Cox, Kirsten, MD  nitroGLYCERIN (NITROSTAT) 0.4 MG SL tablet Place 1 tablet (0.4 mg total) under the tongue every 5 (five) minutes as needed for chest pain. 07/24/23   Cox, Fritzi Mandes, MD  nystatin cream (MYCOSTATIN)  Apply 1 Application topically 2 (two) times daily. 07/07/22   [provider]  SYRINGE-NEEDLE, DISP, 3 ML (BD ECLIPSE SYRINGE/NEEDLE) 25G X 5/8" 3 ML MISC Inject 1 ml of B12 every week for 4 weeks 07/31/23   Cox, Fritzi Mandes, MD  tamsulosin (FLOMAX) 0.4 MG CAPS capsule TAKE 1 CAPSULE BY MOUTH EVERY DAY 08/18/23   Cox, Fritzi Mandes, MD  torsemide (DEMADEX) 20 MG tablet TAKE 1 TABLET BY MOUTH EVERY DAY 07/19/23   Cox, Fritzi Mandes, MD  triamcinolone cream (KENALOG) 0.1 % Apply 1 Application topically 2 (two) times daily. 07/07/22   [provider]  valsartan (DIOVAN) 320 MG tablet TAKE 1 TABLET BY MOUTH EVERY DAY 08/19/23   Cox, Kirsten, MD  Vitamin D, Ergocalciferol, (DRISDOL) 1.25 MG (50000 UNIT) CAPS capsule Take 1 capsule (50,000 Units total) by mouth every 7 (seven) days. 09/12/22   Cox, Fritzi Mandes, MD  zolpidem (AMBIEN) 10 MG tablet Take 1 tablet (10 mg total) by mouth at bedtime. 07/24/23   CoxFritzi Mandes, MD    Family History Family History  Problem Relation Age of Onset   Heart disease Father    Arrhythmia Father    Breast cancer Mother    Diabetes type II Sister    Diabetes Brother     Social History Social History   Tobacco Use   Smoking status: Former    Current packs/day: 0.00    Average packs/day: 1 pack/day for 15.0 years (15.0 ttl pk-yrs)    Types: Cigarettes    Start date: 08/30/1963    Quit date: 08/29/1978    Years since quitting: 45.1    Passive exposure: Past   Smokeless tobacco: Never  Vaping Use   Vaping status: Never Used  Substance Use Topics   Alcohol use: No    Alcohol/week: 0.0 standard drinks of alcohol   Drug use: No     Allergies   Pravastatin, Prednisone, Diltiazem hcl, Ezetimibe, Moxifloxacin hcl, and Pravachol [pravastatin sodium]   Review of Systems Review of Systems  Per HPI   Physical Exam Triage Vital Signs ED Triage Vitals [09/25/23 0859]  Encounter Vitals Group     BP (!) 159/76     Systolic BP Percentile      Diastolic BP  Percentile      Pulse Rate (!) 52     Resp 15     Temp 97.9 F (36.6 C)     Temp Source Oral     SpO2 94 %     Weight      Height      Head Circumference  Peak Flow      Pain Score 7     Pain Loc      Pain Education      Exclude from Growth Chart    No data found.  Updated Vital Signs BP (!) 159/76 (BP Location: Right Arm)   Pulse (!) 52   Temp 97.9 F (36.6 C) (Oral)   Resp 15   SpO2 94%   Visual Acuity Right Eye Distance:   Left Eye Distance:   Bilateral Distance:    Right Eye Near:   Left Eye Near:    Bilateral Near:     Physical Exam Vitals and nursing note reviewed.  Constitutional:      Appearance: Normal appearance.  HENT:     Head: Normocephalic and atraumatic.     Right Ear: External ear normal.     Left Ear: External ear normal.     Nose: Nose normal.     Mouth/Throat:     Mouth: Mucous membranes are moist.  Eyes:     Conjunctiva/sclera: Conjunctivae normal.  Cardiovascular:     Rate and Rhythm: Regular rhythm. Bradycardia present.     Heart sounds: Normal heart sounds. No murmur heard. Pulmonary:     Effort: Pulmonary effort is normal. No respiratory distress.     Breath sounds: Normal breath sounds.  Musculoskeletal:        General: No tenderness, deformity or signs of injury. Normal range of motion.     Right lower leg: 2+ Edema present.     Left lower leg: No edema.  Skin:    General: Skin is warm and dry.     Capillary Refill: Capillary refill takes less than 2 seconds.  Neurological:     General: No focal deficit present.     Mental Status: He is alert and oriented to person, place, and time.  Psychiatric:        Mood and Affect: Mood normal.        Behavior: Behavior normal. Behavior is cooperative.      UC Treatments / Results  Labs (all labs ordered are listed, but only abnormal results are displayed) Labs Reviewed - No data to display  EKG   Radiology US Venous Img Lower Unilateral Right (DVT) Result Date:  09/25/2023 CLINICAL DATA:  Pain and swelling EXAM: Right LOWER EXTREMITY VENOUS DOPPLER ULTRASOUND TECHNIQUE: Gray-scale sonography with compression, as well as color and duplex ultrasound, were performed to evaluate the deep venous system(s) from the level of the common femoral vein through the popliteal and proximal calf veins. COMPARISON:  None Available. FINDINGS: VENOUS Normal compressibility of the common femoral, superficial femoral, and popliteal veins, as well as the visualized calf veins. Visualized portions of profunda femoral vein and great saphenous vein unremarkable. No filling defects to suggest DVT on grayscale or color Doppler imaging. Doppler waveforms show normal direction of venous flow, normal respiratory plasticity and response to augmentation. Limited views of the contralateral common femoral vein are unremarkable. OTHER None. Limitations: none IMPRESSION: No evidence of right lower extremity DVT. Electronically Signed   By: Karen Kays M.D.   On: 09/25/2023 10:24    Procedures Procedures (including critical care time)  Medications Ordered in UC Medications - No data to display  Initial Impression / Assessment and Plan / UC Course  I have reviewed the triage vital signs and the nursing notes.  Pertinent labs & imaging results that were available during my care of the patient were reviewed by me and considered  in my medical decision making (see chart for details).  Vitals and triage reviewed, patient is hemodynamically stable.  Lungs vesicular, heart with regular rate and rhythm, he is bradycardic.  Right-sided lower extremity edema, none noted to the left side.  Atraumatic.  X-ray deferred.  Right calf is significantly more swollen than the left.  Risk factors for age, immobility, and Swelling.  Lower extremity ultrasound DVT study obtained.  Patient is already on anticoagulation and no pain with dorsiflexion of the foot.  Discussed this could be due to congestive heart  failure, peripheral edema, or multiple other factors.  Symptomatic management for lower leg swelling discussed.  PCP follow-up encouraged.  Notified via telephone of negative DVT results.    Final Clinical Impressions(s) / UC Diagnoses   Final diagnoses:  Pain and swelling of right lower leg     Discharge Instructions      Please wear compression stockings throughout the day.  Continue to take your medication as prescribed.  Elevate your extremities when able.  You can take 500 mg of Tylenol every 8 hours as needed for pain.  The results of the ultrasound will be available shortly and I will contact you if there is anything that needs follow-up.  If your symptoms persist please follow-up with your primary care provider for further advanced evaluation.     ED Prescriptions   None    PDMP not reviewed this encounter.   Maxemiliano Riel, [place] N, [place] 09/25/23 1040

## 2023-09-27 ENCOUNTER — Other Ambulatory Visit: Payer: Self-pay | Admitting: Cardiology

## 2023-09-27 DIAGNOSIS — I5032 Chronic diastolic (congestive) heart failure: Secondary | ICD-10-CM

## 2023-10-02 ENCOUNTER — Ambulatory Visit (HOSPITAL_COMMUNITY)
Admission: RE | Admit: 2023-10-02 | Discharge: 2023-10-02 | Disposition: A | Discharge status: Home / Self Care | Payer: Medicare Other | Source: Ambulatory Visit | Attending: Surgery | Admitting: Surgery

## 2023-10-02 ENCOUNTER — Ambulatory Visit (INDEPENDENT_AMBULATORY_CARE_PROVIDER_SITE_OTHER): Payer: Medicare Other | Admitting: Physician Assistant

## 2023-10-02 VITALS — BP 146/77 | HR 53 | Temp 98.0°F | Resp 20 | Ht 70.0 in | Wt 238.5 lb

## 2023-10-02 DIAGNOSIS — I6523 Occlusion and stenosis of bilateral carotid arteries: Secondary | ICD-10-CM

## 2023-10-02 NOTE — Progress Notes (Signed)
History of Present Illness:  Patient is a 78 y.o. year old male who presents for evaluation of carotid stenosis.  e has a history of  light headedness spell where he felt off balance for a few minutes. Work-up for this included a negative Holter monitor, and an echo which showed mild systolic dysfunction. Intracranial images did not show any acute findings carotid ultrasound showed 50 to 69% left-sided stenosis both. His symptoms resolved when he got hearing aides.  The patient denies symptoms of TIA, amaurosis, weakness, aphasia or stroke.    The patient is medically managed for hypertension.  He suffers from A-fib for which she takes Eliquis.  He suffers from COPD secondary to history of tobacco abuse.  He takes a statin for hypercholesterolemia.        Past Medical History:  Diagnosis Date   A-fib (HCC)    Acquired thrombophilia (HCC) 11/17/2019   Acute bronchitis due to other specified organisms 10/28/2021   Acute cough 10/20/2021   Acute non-recurrent maxillary sinusitis 10/28/2021   Anticoagulated 01/21/2015   Atherosclerotic heart disease of native coronary artery without angina pectoris    Atrial fibrillation with rapid ventricular response (HCC) 11/12/2021   Atrial flutter with rapid ventricular response (HCC) 11/12/2021   CAD in native artery 01/21/2015   [facility])    skin cancer   Carotid artery occlusion    CHF (congestive heart failure) (HCC) 11/05/2019   Chronic diastolic (congestive) heart failure (HCC)    Chronic diastolic heart failure (HCC) 01/21/2015   Last Assessment & Plan:  Is congestive heart failure is mildly decompensated he is edematous he'll increase the dose of his diuretic today the lab work performed including an ARB referred to care transitions for home care he'll follow-up in my office with me in 4-6 weeks daily weights sodium restrictionand compliance of medications beinghemoglobin of will   Chronic obstructive pulmonary disease (COPD) (HCC)     Coronary artery disease involving autologous artery coronary bypass graft 11/12/2021   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 11/17/2019   Dizziness 11/12/2021   Dyslipidemia 11/12/2021   Elevated brain natriuretic peptide (BNP) level 11/12/2021   Essential hypertension 01/21/2015   right superior quadrantanopsia   Hearing loss of right ear due to cerumen impaction 10/20/2021   High cholesterol 10/20/2017   Hx of CABG 03/02/2017   Hypertension    Hypertensive heart disease with heart failure (HCC)    Hypothyroidism (acquired) 04/24/2018   Impaired fasting glucose    Left rotator cuff tear 11/12/2021   Mixed hyperlipidemia 11/17/2019   Myocardial infarction (HCC)    Non-seasonal allergic rhinitis due to pollen 10/20/2021   On amiodarone therapy 01/21/2015   PAF (paroxysmal atrial fibrillation) (HCC)    Primary insomnia    Sequela of cardioembolic stroke 11/17/2019   Severe obesity with body mass index (BMI) of 36.0 to 36.9 with serious comorbidity (HCC)    Squamous cell carcinoma 11/05/2019   Typical atrial flutter (HCC) 01/21/2015   Vitamin D deficiency, unspecified     Past Surgical History:  Procedure Laterality Date   APPENDECTOMY  1956   CARDIAC CATHETERIZATION     CARDIOVERSION  2002   CHOLECYSTECTOMY  2006   CORONARY ARTERY BYPASS GRAFT  2001   HERNIA REPAIR     RADIOLOGY WITH ANESTHESIA Bilateral 01/18/2018   Procedure: MRI WITH ANESTHESIA LUMBAR SPINE WITH AND WITHOUT CONTRAST;  Surgeon: Radiologist, Medication, MD;  Location: [facility] OR;  Service: Radiology;  Laterality: Bilateral;  RADIOLOGY WITH ANESTHESIA N/A 04/19/2022   Procedure: MRI WITH BRAIN WITH AND WITHOUT CONTRAST;  Surgeon: Radiologist, Medication, MD;  Location: [facility] OR;  Service: Radiology;  Laterality: N/A;   ROTATOR CUFF REPAIR Left    TONSILLECTOMY  1975     Social History Social History   Tobacco Use   Smoking status: Former    Current packs/day: 0.00    Average packs/day: 1  pack/day for 15.0 years (15.0 ttl pk-yrs)    Types: Cigarettes    Start date: 08/30/1963    Quit date: 08/29/1978    Years since quitting: 45.1    Passive exposure: Past   Smokeless tobacco: Never  Vaping Use   Vaping status: Never Used  Substance Use Topics   Alcohol use: No    Alcohol/week: 0.0 standard drinks of alcohol   Drug use: No    Family History Family History  Problem Relation Age of Onset   Heart disease Father    Arrhythmia Father    Breast cancer Mother    Diabetes type II Sister    Diabetes Brother     Allergies  Allergies  Allergen Reactions   Pravastatin Other (See Comments)    Other Reaction(s): Other (See Comments)  myalgias, myalgia  Other Reaction(s): Other (See Comments)    myalgias, myalgia    myalgias myalgia    myalgia    Other Reaction(s): Other (See Comments)  myalgias, myalgia    myalgias   Prednisone Other (See Comments) and Anxiety    "makes me feel terrible"  Other Reaction(s): Angioedema, Other (See Comments)  Pt feels out of regular routine, "makes me feel terrible"  Other Reaction(s): Angioedema, Other (See Comments)    Pt feels out of regular routine, "makes me feel terrible"    Pt feels out of regular routine    Pt feels out of regular routine "makes me feel terrible"    "makes me feel terrible"  Other Reaction(s): Angioedema, Other (See Comments)  Pt feels out of regular routine, "makes me feel terrible"   Diltiazem Hcl Nausea And Vomiting and Other (See Comments)   Ezetimibe Other (See Comments)    myalgias  Other Reaction(s): Other (See Comments)    myalgias   Moxifloxacin Hcl Other (See Comments)    Unknown  Other Reaction(s): Other (See Comments)    Pt reports unknown reaction    Unknown   Pravachol [Pravastatin Sodium] Other (See Comments)    myalgia     Current Outpatient Medications  Medication Sig Dispense Refill   acetaminophen (TYLENOL) 500 MG tablet Take 500 mg by mouth 2 (two) times daily.      amiodarone (PACERONE) 200 MG tablet Take 1 tablet (200 mg total) by mouth daily. 90 tablet 2   amLODipine (NORVASC) 5 MG tablet TAKE 1 TABLET (5 MG TOTAL) BY MOUTH DAILY. 90 tablet 2   apixaban (ELIQUIS) 5 MG TABS tablet Take 1 tablet (5 mg total) by mouth 2 (two) times daily. 180 tablet 1   atorvastatin (LIPITOR) 20 MG tablet TAKE 1 TABLET BY MOUTH EVERY DAY 90 tablet 1   cyanocobalamin (VITAMIN B12) 1000 MCG/ML injection Inject 1 mL (1,000 mcg total) into the muscle every 30 (thirty) days. 3 mL 0   ELIQUIS 5 MG TABS tablet TAKE 1 TABLET BY MOUTH TWICE A DAY (Patient taking differently: Take 5 mg by mouth 2 (two) times daily.) 60 tablet 5   finasteride (PROSCAR) 5 MG tablet Take 1 tablet (5 mg total) by mouth daily. 90  tablet 1   fluticasone (FLONASE) 50 MCG/ACT nasal spray Place 2 sprays into both nostrils daily. Use in addition to azelastine. 16 g 6   Inulin (FIBER CHOICE PO) Take 3 tablets by mouth daily as needed (Constipation).     ketoconazole (NIZORAL) 2 % shampoo Apply 1 Application topically 3 (three) times a week.     levothyroxine (SYNTHROID) 112 MCG tablet TAKE 1 TABLET BY MOUTH EVERY DAY 90 tablet 3   nitroGLYCERIN (NITROSTAT) 0.4 MG SL tablet Place 1 tablet (0.4 mg total) under the tongue every 5 (five) minutes as needed for chest pain. 25 tablet 2   nystatin cream (MYCOSTATIN) Apply 1 Application topically 2 (two) times daily.     SYRINGE-NEEDLE, DISP, 3 ML (BD ECLIPSE SYRINGE/NEEDLE) 25G X 5/8" 3 ML MISC Inject 1 ml of B12 every week for 4 weeks 50 each 0   tamsulosin (FLOMAX) 0.4 MG CAPS capsule TAKE 1 CAPSULE BY MOUTH EVERY DAY 90 capsule 1   torsemide (DEMADEX) 20 MG tablet TAKE 1 TABLET BY MOUTH EVERY DAY 90 tablet 0   triamcinolone cream (KENALOG) 0.1 % Apply 1 Application topically 2 (two) times daily.     valsartan (DIOVAN) 320 MG tablet TAKE 1 TABLET BY MOUTH EVERY DAY 90 tablet 1   Vitamin D, Ergocalciferol, (DRISDOL) 1.25 MG (50000 UNIT) CAPS capsule Take 1 capsule  (50,000 Units total) by mouth every 7 (seven) days. 12 capsule 2   zolpidem (AMBIEN) 10 MG tablet Take 1 tablet (10 mg total) by mouth at bedtime. 90 tablet 1   No current facility-administered medications for this visit.    ROS:   General:  No weight loss, Fever, chills  HEENT: No recent headaches, no nasal bleeding, no visual changes, no sore throat  Neurologic: No dizziness, blackouts, seizures. No recent symptoms of stroke or mini- stroke. No recent episodes of slurred speech, or temporary blindness.  Cardiac: No recent episodes of chest pain/pressure, no shortness of breath at rest.  No shortness of breath with exertion.  Denies history of atrial fibrillation or irregular heartbeat  Vascular: No history of rest pain in feet.  No history of claudication.  No history of non-healing ulcer, No history of DVT   Pulmonary: No home oxygen, no productive cough, no hemoptysis,  No asthma or wheezing  Musculoskeletal:  [ ]  Arthritis, [ ]  Low back pain,  [ ]  Joint pain  Hematologic:No history of hypercoagulable state.  No history of easy bleeding.  No history of anemia  Gastrointestinal: No hematochezia or melena,  No gastroesophageal reflux, no trouble swallowing  Urinary: [ ]  chronic Kidney disease, [ ]  on HD - [ ]  MWF or [ ]  TTHS, [ ]  Burning with urination, [ ]  Frequent urination, [ ]  Difficulty urinating;   Skin: No rashes  Psychological: No history of anxiety,  No history of depression   Physical Examination  Vitals:   10/02/23 1240 10/02/23 1242  BP: 134/66 (!) 146/77  Pulse: (!) 53   Resp: 20   Temp: 98 F (36.7 C)   TempSrc: Temporal   SpO2: 96%   Weight: 238 lb 8 oz (108.2 kg)   Height: 5\' 10"  (1.778 m)     Body mass index is 34.22 kg/m.  General:  Alert and oriented, no acute distress HEENT: Normal Neck: No bruit or JVD Pulmonary: Clear to auscultation bilaterally Cardiac: Regular Rate and Rhythm without murmur Gastrointestinal: Soft, non-tender,  non-distended, no mass, no scars Skin: No rash Extremity Pulses:   radial  pulses bilaterally Musculoskeletal: No deformity or edema  Neurologic: Upper and lower extremity motor 5/5 and symmetric  DATA:  Right Carotid Findings:  +----------+--------+--------+--------+----------------------+--------+           PSV cm/sEDV cm/sStenosisPlaque Description    Comments  +----------+--------+--------+--------+----------------------+--------+  CCA Prox  111     11                                              +----------+--------+--------+--------+----------------------+--------+  CCA Mid   88      13                                              +----------+--------+--------+--------+----------------------+--------+  CCA Distal71      15              calcific                        +----------+--------+--------+--------+----------------------+--------+  ICA Prox  114     23              calcific                        +----------+--------+--------+--------+----------------------+--------+  ICA Mid   116     29      1-39%                                   +----------+--------+--------+--------+----------------------+--------+  ICA Distal63      11                                              +----------+--------+--------+--------+----------------------+--------+  ECA      312     29      >50%    irregular and calcific          +----------+--------+--------+--------+----------------------+--------+   +----------+--------+-------+----------------+-------------------+           PSV cm/sEDV cmsDescribe        Arm Pressure (mmHG)  +----------+--------+-------+----------------+-------------------+  [id number]           Multiphasic, WNL                     +----------+--------+-------+----------------+-------------------+   +---------+--------+--+--------+--+---------+  VertebralPSV cm/s66EDV cm/s14Antegrade   +---------+--------+--+--------+--+---------+      Left Carotid Findings:  +----------+--------+--------+--------+------------------+--------+           PSV cm/sEDV cm/sStenosisPlaque DescriptionComments  +----------+--------+--------+--------+------------------+--------+  CCA Prox  115     23                                          +----------+--------+--------+--------+------------------+--------+  CCA Mid   74      17                                          +----------+--------+--------+--------+------------------+--------+  CCA Distal83      17              heterogenous                +----------+--------+--------+--------+------------------+--------+  ICA Prox  92      18              irregular                   +----------+--------+--------+--------+------------------+--------+  ICA Mid   88      23                                          +----------+--------+--------+--------+------------------+--------+  ICA Distal98      21                                          +----------+--------+--------+--------+------------------+--------+  ECA      328     19      >50%    calcific                    +----------+--------+--------+--------+------------------+--------+   +----------+--------+--------+----------------+-------------------+           PSV cm/sEDV cm/sDescribe        Arm Pressure (mmHG)  +----------+--------+--------+----------------+-------------------+  [id number]            Multiphasic, WNL                     +----------+--------+--------+----------------+-------------------+   +---------+--------+--+--------+--+---------+  VertebralPSV cm/s91EDV cm/s23Antegrade  +---------+--------+--+--------+--+---------+         Summary:  Right Carotid: Velocities in the right ICA are consistent with a 1-39%  stenosis.                The ECA appears >50% stenosed.   Left Carotid: Velocities  in the left ICA are consistent with a 1-39%  stenosis.               The ECA appears >50% stenosed.   Vertebrals:  Bilateral vertebral arteries demonstrate antegrade flow.  Subclavians: Normal flow hemodynamics were seen in bilateral subclavian               arteries.    ASSESSMENT/PLAN: He is asymptomatic for stroke/TIA symptoms and he has had resolution of the balance issue since her got hearing aides.    The carotid duplex was done and shows < 39% stenosis B ICA.  We will have him follow-up in 1 year with repeat ultrasound. He is on a statin and is anticoagulated for his A-fib. We discussed symptoms of stroke/TIA if these occur he will call 911.        Mosetta Pigeon PA-C [facility] Office: [phone]  MD in clinic [place]

## 2023-10-13 ENCOUNTER — Other Ambulatory Visit: Payer: Self-pay

## 2023-10-13 DIAGNOSIS — I6523 Occlusion and stenosis of bilateral carotid arteries: Secondary | ICD-10-CM

## 2023-10-14 ENCOUNTER — Other Ambulatory Visit: Payer: Self-pay | Admitting: Family Medicine

## 2023-10-28 ENCOUNTER — Other Ambulatory Visit: Payer: Self-pay | Admitting: Family Medicine

## 2023-10-28 DIAGNOSIS — E538 Deficiency of other specified B group vitamins: Secondary | ICD-10-CM

## 2023-11-01 NOTE — Progress Notes (Unsigned)
 Subjective:  Patient ID: John Kemp, male    DOB: 08-23-1946  Age: 78 y.o. MRN: [id number]  Chief Complaint  Patient presents with   Medical Management of Chronic Issues   Discussed the use of AI scribe software for clinical note transcription with the patient, who gave verbal consent to proceed.  History of Present Illness   The patient, with a history of atrial fibrillation, hypertension, hypothyroidism, benign prostatic hyperplasia, and hyperlipidemia, presents with right ankle pain that started a month ago. The pain began after wearing new tennis shoes with memory foam, which caused the foot to turn in. The pain is less severe with different shoes. The patient also reports that the right foot and leg have been swelling, even prior to the onset of the ankle pain.  The patient's atrial fibrillation is managed with amiodarone and Eliquis, hypertension with amlodipine and valsartan, hypothyroidism with levothyroxine 112 mcg daily, and benign prostatic hyperplasia with tamsulosin and finasteride. The patient reports no issues with urination. The patient also takes torsemide, a diuretic, to manage the leg swelling. The patient's hyperlipidemia is managed with atorvastatin, and he has nitroglycerin at home for chest pain. The patient also takes vitamin D once a week, B12 injections once a month, and Ambien for sleep.  The patient denies any recent falls, fevers, chills, sweats, earaches, sore throat, or stuffy nose. The patient admits to not eating healthily but avoids salt in his diet. The patient also reports attending cardiac pulmonary rehab three days a week.     Afib: Taking Eliquis 5 mg twice a day, Amiodarone 200 mg daily.  Hypertension: Takes Amlodipine 5 mg daily, Valsartan 320 mg daily.  Hypothyroidism: Currently on Levothyroxine 112 mcg daily.  BPH: Tamsulosin 0.4 mg daily, Finasteride 5 mg daily. No trouble urinating.   Hyperlipidemia: Atorvastatin 20 mg every  day.  CONGESTIVE HEART FAILURE/CAD: Currently on torsemide 20 mg every day and has ntg.   Vitamin D deficiency: recommend vitamin D  50K weekly.   Insomnia: ambien 10 mg before bed. Must take every night to sleep.        11/02/2023    8:48 AM 09/11/2023    2:02 PM 07/24/2023    8:33 AM 07/24/2023    8:03 AM 01/11/2023    8:28 AM  Depression screen PHQ 2/9  Decreased Interest 0 0 0 0 0  Down, Depressed, Hopeless 0 0 0 0 0  PHQ - 2 Score 0 0 0 0 0  Altered sleeping     0  Tired, decreased energy     0  Change in appetite     0  Feeling bad or failure about yourself      0  Trouble concentrating     0  Moving slowly or fidgety/restless     0  Suicidal thoughts     0  PHQ-9 Score     0  Difficult doing work/chores     Not difficult at all        11/02/2023    8:48 AM  Fall Risk   Falls in the past year? 0  Number falls in past yr: 0  Injury with Fall? 0  Risk for fall due to : History of fall(s)  Follow up Falls evaluation completed;Falls prevention discussed    Patient Care Team: Blane Ohara, MD as PCP - General (Family Medicine) Zettie Pho, [facility] (Inactive) as Pharmacist (Pharmacist) Christinia Gully, AUD (Audiology) Baldo Daub, MD as Consulting Physician (Cardiology)  Review of Systems  Constitutional:  Negative for chills, diaphoresis, fatigue and fever.  HENT:  Negative for congestion, ear pain and sore throat.   Respiratory:  Negative for cough and shortness of breath.   Cardiovascular:  Negative for chest pain and leg swelling.  Gastrointestinal:  Negative for abdominal pain, constipation, diarrhea, nausea and vomiting.  Genitourinary:  Negative for dysuria and urgency.  Musculoskeletal:  Positive for arthralgias (right ankle). Negative for myalgias.  Neurological:  Negative for dizziness and headaches.  Psychiatric/Behavioral:  Negative for dysphoric mood.     Current Outpatient Medications on File Prior to Visit  Medication Sig Dispense Refill    acetaminophen (TYLENOL) 500 MG tablet Take 500 mg by mouth 2 (two) times daily.     amiodarone (PACERONE) 200 MG tablet Take 1 tablet (200 mg total) by mouth daily. 90 tablet 2   amLODipine (NORVASC) 5 MG tablet TAKE 1 TABLET (5 MG TOTAL) BY MOUTH DAILY. 90 tablet 2   apixaban (ELIQUIS) 5 MG TABS tablet Take 1 tablet (5 mg total) by mouth 2 (two) times daily. 180 tablet 1   atorvastatin (LIPITOR) 20 MG tablet TAKE 1 TABLET BY MOUTH EVERY DAY 90 tablet 1   cyanocobalamin (VITAMIN B12) 1000 MCG/ML injection INJECT 1 ML (1,000 MCG TOTAL) INTO THE MUSCLE EVERY 30 DAYS. 3 mL 0   ELIQUIS 5 MG TABS tablet TAKE 1 TABLET BY MOUTH TWICE A DAY (Patient taking differently: Take 5 mg by mouth 2 (two) times daily.) 60 tablet 5   finasteride (PROSCAR) 5 MG tablet Take 1 tablet (5 mg total) by mouth daily. 90 tablet 1   fluticasone (FLONASE) 50 MCG/ACT nasal spray Place 2 sprays into both nostrils daily. Use in addition to azelastine. 16 g 6   Inulin (FIBER CHOICE PO) Take 3 tablets by mouth daily as needed (Constipation).     ketoconazole (NIZORAL) 2 % shampoo Apply 1 Application topically 3 (three) times a week.     levothyroxine (SYNTHROID) 112 MCG tablet TAKE 1 TABLET BY MOUTH EVERY DAY 90 tablet 3   nitroGLYCERIN (NITROSTAT) 0.4 MG SL tablet Place 1 tablet (0.4 mg total) under the tongue every 5 (five) minutes as needed for chest pain. 25 tablet 2   nystatin cream (MYCOSTATIN) Apply 1 Application topically 2 (two) times daily.     SYRINGE-NEEDLE, DISP, 3 ML (BD ECLIPSE SYRINGE/NEEDLE) 25G X 5/8" 3 ML MISC Inject 1 ml of B12 every week for 4 weeks 50 each 0   tamsulosin (FLOMAX) 0.4 MG CAPS capsule TAKE 1 CAPSULE BY MOUTH EVERY DAY 90 capsule 1   torsemide (DEMADEX) 20 MG tablet TAKE 1 TABLET BY MOUTH EVERY DAY 90 tablet 0   triamcinolone cream (KENALOG) 0.1 % Apply 1 Application topically 2 (two) times daily.     valsartan (DIOVAN) 320 MG tablet TAKE 1 TABLET BY MOUTH EVERY DAY 90 tablet 1   Vitamin D,  Ergocalciferol, (DRISDOL) 1.25 MG (50000 UNIT) CAPS capsule Take 1 capsule (50,000 Units total) by mouth every 7 (seven) days. 12 capsule 2   zolpidem (AMBIEN) 10 MG tablet Take 1 tablet (10 mg total) by mouth at bedtime. 90 tablet 1   No current facility-administered medications on file prior to visit.   Past Medical History:  Diagnosis Date   A-fib [facility])    Acquired thrombophilia (HCC) 11/17/2019   Acute bronchitis due to other specified organisms 10/28/2021   Acute cough 10/20/2021   Acute non-recurrent maxillary sinusitis 10/28/2021   Anticoagulated 01/21/2015  Atherosclerotic heart disease of native coronary artery without angina pectoris    Atrial fibrillation with rapid ventricular response (HCC) 11/12/2021   Atrial flutter with rapid ventricular response (HCC) 11/12/2021   CAD in native artery 01/21/2015   Cancer [facility])    skin cancer   Carotid artery occlusion    CHF (congestive heart failure) (HCC) 11/05/2019   Chronic diastolic (congestive) heart failure (HCC)    Chronic diastolic heart failure (HCC) 01/21/2015   Last Assessment & Plan:  Is congestive heart failure is mildly decompensated he is edematous he'll increase the dose of his diuretic today the lab work performed including an ARB referred to care transitions for home care he'll follow-up in my office with me in 4-6 weeks daily weights sodium restrictionand compliance of medications beinghemoglobin of will   Chronic obstructive pulmonary disease (COPD) (HCC)    Coronary artery disease involving autologous artery coronary bypass graft 11/12/2021   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 11/17/2019   Dizziness 11/12/2021   Dyslipidemia 11/12/2021   Elevated brain natriuretic peptide (BNP) level 11/12/2021   Essential hypertension 01/21/2015   right superior quadrantanopsia   Hearing loss of right ear due to cerumen impaction 10/20/2021   High cholesterol 10/20/2017   Hx of CABG 03/02/2017    Hypertension    Hypertensive heart disease with heart failure (HCC)    Hypothyroidism (acquired) 04/24/2018   Impaired fasting glucose    Left rotator cuff tear 11/12/2021   Mixed hyperlipidemia 11/17/2019   Myocardial infarction (HCC)    Non-seasonal allergic rhinitis due to pollen 10/20/2021   On amiodarone therapy 01/21/2015   PAF (paroxysmal atrial fibrillation) (HCC)    Primary insomnia    Sequela of cardioembolic stroke 11/17/2019   Severe obesity with body mass index (BMI) of 36.0 to 36.9 with serious comorbidity (HCC)    Squamous cell carcinoma 11/05/2019   Typical atrial flutter (HCC) 01/21/2015   Vitamin D deficiency, unspecified    Past Surgical History:  Procedure Laterality Date   APPENDECTOMY  1956   CARDIAC CATHETERIZATION     CARDIOVERSION  2002   CHOLECYSTECTOMY  2006   CORONARY ARTERY BYPASS GRAFT  2001   HERNIA REPAIR     RADIOLOGY WITH ANESTHESIA Bilateral 01/18/2018   Procedure: MRI WITH ANESTHESIA LUMBAR SPINE WITH AND WITHOUT CONTRAST;  Surgeon: Radiologist, Medication, MD;  Location: [facility] OR;  Service: Radiology;  Laterality: Bilateral;   RADIOLOGY WITH ANESTHESIA N/A 04/19/2022   Procedure: MRI WITH BRAIN WITH AND WITHOUT CONTRAST;  Surgeon: Radiologist, Medication, MD;  Location: [facility] OR;  Service: Radiology;  Laterality: N/A;   ROTATOR CUFF REPAIR Left    TONSILLECTOMY  1975    Family History  Problem Relation Age of Onset   Heart disease Father    Arrhythmia Father    Breast cancer Mother    Diabetes type II Sister    Diabetes Brother    Social History   Socioeconomic History   Marital status: Married    Spouse name: Darl Pikes   Number of children: 2   Years of education: Not on file   Highest education level: Not on file  Occupational History   Occupation: Retired  Tobacco Use   Smoking status: Former    Current packs/day: 0.00    Average packs/day: 1 pack/day for 15.0 years (15.0 ttl pk-yrs)    Types: Cigarettes    Start date: 08/30/1963     Quit date: 08/29/1978    Years since quitting: 45.2  Passive exposure: Past   Smokeless tobacco: Never  Vaping Use   Vaping status: Never Used  Substance and Sexual Activity   Alcohol use: No    Alcohol/week: 0.0 standard drinks of alcohol   Drug use: No   Sexual activity: Not Currently  Other Topics Concern   Not on file  Social History Narrative   Not on file   Social Drivers of Health   Financial Resource Strain: Low Risk  (09/11/2023)   Overall Financial Resource Strain (CARDIA)    Difficulty of Paying Living Expenses: Not very hard  Food Insecurity: No Food Insecurity (09/11/2023)   Hunger Vital Sign    Worried About Running Out of Food in the Last Year: Never true    Ran Out of Food in the Last Year: Never true  Transportation Needs: No Transportation Needs (09/11/2023)   PRAPARE - Administrator, Civil Service (Medical): No    Lack of Transportation (Non-Medical): No  Physical Activity: Sufficiently Active (09/11/2023)   Exercise Vital Sign    Days of Exercise per Week: 4 days    Minutes of Exercise per Session: 50 min  Stress: No Stress Concern Present (09/11/2023)   [employer] of Occupational Health - Occupational Stress Questionnaire    Feeling of Stress : Not at all  Social Connections: Moderately Integrated (11/02/2023)   Social Connection and Isolation Panel [NHANES]    Frequency of Communication with Friends and Family: Three times a week    Frequency of Social Gatherings with Friends and Family: Three times a week    Attends Religious Services: More than 4 times per year    Active Member of Clubs or Organizations: No    Attends Banker Meetings: Never    Marital Status: Married    Objective:  BP 126/72   Pulse 61   Temp 97.9 F (36.6 C)   Ht 5\' 10"  (1.778 m)   Wt 236 lb (107 kg)   SpO2 94%   BMI 33.86 kg/m      11/02/2023    8:16 AM 10/02/2023   12:42 PM 10/02/2023   12:40 PM  BP/Weight  Systolic BP 126 146 134   Diastolic BP 72 77 66  Wt. (Lbs) 236  238.5  BMI 33.86 kg/m2  34.22 kg/m2    Physical Exam Vitals reviewed.  Constitutional:      Appearance: Normal appearance. He is obese.  Neck:     Vascular: No carotid bruit.  Cardiovascular:     Rate and Rhythm: Normal rate and regular rhythm.     Pulses: Normal pulses.     Heart sounds: Normal heart sounds.  Pulmonary:     Effort: Pulmonary effort is normal.     Breath sounds: Normal breath sounds. No wheezing, rhonchi or rales.  Abdominal:     General: Bowel sounds are normal.     Palpations: Abdomen is soft.     Tenderness: There is no abdominal tenderness.  Musculoskeletal:     Right ankle: Swelling (mild. baseline.) present. Tenderness present.     Left ankle: No swelling.       Legs:  Neurological:     Mental Status: He is alert.  Psychiatric:        Mood and Affect: Mood normal.        Behavior: Behavior normal.     Diabetic Foot Exam - Simple   Simple Foot Form Diabetic Foot exam was performed with the following findings: Yes 11/02/2023  8:59  AM  Visual Inspection No deformities, no ulcerations, no other skin breakdown bilaterally: Yes Sensation Testing Intact to touch and monofilament testing bilaterally: Yes Pulse Check Posterior Tibialis and Dorsalis pulse intact bilaterally: Yes Comments      Lab Results  Component Value Date   WBC 6.2 11/02/2023   HGB 14.1 11/02/2023   HCT 41.7 11/02/2023   PLT 201 11/02/2023   GLUCOSE 91 11/02/2023   CHOL 136 11/02/2023   TRIG 101 11/02/2023   HDL 45 11/02/2023   LDLCALC 72 11/02/2023   ALT 17 11/02/2023   AST 19 11/02/2023   NA 140 11/02/2023   K 4.2 11/02/2023   CL 100 11/02/2023   CREATININE 1.50 (H) 11/02/2023   BUN 21 11/02/2023   CO2 26 11/02/2023   TSH 3.750 11/02/2023   INR 1.1 02/02/2022   HGBA1C 5.5 11/02/2023   MICROALBUR Positive 150 01/23/2019      Assessment & Plan:   Hypertensive heart disease with heart failure (HCC) Assessment &  Plan: Well controlled.  No changes to medicines. Takes Amlodipine 5 mg daily and Valsartan 1 tablet 320 mg daily. Patient wears compression socks daily and takes Torsemide 20mg  daily. -Continue current regimen. Continue to work on eating a healthy diet and exercise.  Continue cardiopulmonary rehab. Labs drawn today.   Management per specialist.  Cardiology  Orders: -     CBC with Differential/Platelet -     Comprehensive metabolic panel  Persistent atrial fibrillation (HCC) Assessment & Plan: Management per specialist. Taking Eliquis 5 mg twice a day, Amiodarone 200 mg daily.    Acquired hypothyroidism Assessment & Plan: Previously well controlled Continue Synthroid at current dose  Recheck TSH and adjust Synthroid as indicated    Orders: -     TSH  Impaired fasting glucose Assessment & Plan: Hemoglobin A1c 5.5%, 3 month avg of blood sugars, is in prediabetic range.  In order to prevent progression to diabetes, recommend low carb diet and regular exercise   Orders: -     Hemoglobin A1c -     Microalbumin / creatinine urine ratio  Stage 3a chronic kidney disease (HCC) Assessment & Plan: stable   Mixed hyperlipidemia Assessment & Plan: Well controlled.  No changes to medicines. Atorvastatin 20 mg every day  Continue to work on eating a healthy diet and exercise.  Labs drawn today.    Orders: -     Lipid panel  Primary insomnia Assessment & Plan: Continue ambien 10 mg before bed.    Class 1 obesity due to excess calories with serious comorbidity and body mass index (BMI) of 33.0 to 33.9 in adult Assessment & Plan: Recommend continue to work on eating healthy diet and exercise. Comorbidities: diabetes      No orders of the defined types were placed in this encounter.   Orders Placed This Encounter  Procedures   CBC with Differential/Platelet   Comprehensive metabolic panel   Hemoglobin A1c   Lipid panel   TSH   Microalbumin / creatinine urine  ratio     Follow-up: Return in about 3 months (around 02/02/2024) for chronic follow up.   I,Marla I Leal-Borjas,acting as a scribe for Blane Ohara, MD.,have documented all relevant documentation on the behalf of Blane Ohara, MD,as directed by  Blane Ohara, MD while in the presence of Blane Ohara, MD.   An After Visit Summary was printed and given to the patient.  I attest that I have reviewed this visit and agree with the plan scribed  by my staff.   Blane Ohara, MD [facility] [phone]

## 2023-11-02 ENCOUNTER — Ambulatory Visit: Payer: Medicare Other | Admitting: Family Medicine

## 2023-11-02 ENCOUNTER — Encounter: Payer: Self-pay | Admitting: Family Medicine

## 2023-11-02 VITALS — BP 126/72 | HR 61 | Temp 97.9°F | Ht 70.0 in | Wt 236.0 lb

## 2023-11-02 DIAGNOSIS — Z6833 Body mass index (BMI) 33.0-33.9, adult: Secondary | ICD-10-CM | POA: Diagnosis not present

## 2023-11-02 DIAGNOSIS — N1831 Chronic kidney disease, stage 3a: Secondary | ICD-10-CM

## 2023-11-02 DIAGNOSIS — E782 Mixed hyperlipidemia: Secondary | ICD-10-CM

## 2023-11-02 DIAGNOSIS — E6609 Other obesity due to excess calories: Secondary | ICD-10-CM | POA: Diagnosis not present

## 2023-11-02 DIAGNOSIS — I110 Hypertensive heart disease with heart failure: Secondary | ICD-10-CM

## 2023-11-02 DIAGNOSIS — R7301 Impaired fasting glucose: Secondary | ICD-10-CM

## 2023-11-02 DIAGNOSIS — F5101 Primary insomnia: Secondary | ICD-10-CM | POA: Diagnosis not present

## 2023-11-02 DIAGNOSIS — I4819 Other persistent atrial fibrillation: Secondary | ICD-10-CM

## 2023-11-02 DIAGNOSIS — E039 Hypothyroidism, unspecified: Secondary | ICD-10-CM | POA: Diagnosis not present

## 2023-11-02 DIAGNOSIS — E66811 Obesity, class 1: Secondary | ICD-10-CM

## 2023-11-03 LAB — COMPREHENSIVE METABOLIC PANEL
ALT: 17 IU/L (ref 0–44)
AST: 19 IU/L (ref 0–40)
Albumin: 4.4 g/dL (ref 3.8–4.8)
Alkaline Phosphatase: 116 IU/L (ref 44–121)
BUN/Creatinine Ratio: 14 (ref 10–24)
BUN: 21 mg/dL (ref 8–27)
Bilirubin Total: 0.7 mg/dL (ref 0.0–1.2)
CO2: 26 mmol/L (ref 20–29)
Calcium: 9.3 mg/dL (ref 8.6–10.2)
Chloride: 100 mmol/L (ref 96–106)
Creatinine, Ser: 1.5 mg/dL — ABNORMAL HIGH (ref 0.76–1.27)
Globulin, Total: 2.6 g/dL (ref 1.5–4.5)
Glucose: 91 mg/dL (ref 70–99)
Potassium: 4.2 mmol/L (ref 3.5–5.2)
Sodium: 140 mmol/L (ref 134–144)
Total Protein: 7 g/dL (ref 6.0–8.5)
eGFR: 48 mL/min/{1.73_m2} — ABNORMAL LOW (ref >59)

## 2023-11-03 LAB — LIPID PANEL
Chol/HDL Ratio: 3 ratio (ref 0.0–5.0)
Cholesterol, Total: 136 mg/dL (ref 100–199)
HDL: 45 mg/dL (ref >39)
LDL Chol Calc (NIH): 72 mg/dL (ref 0–99)
Triglycerides: 101 mg/dL (ref 0–149)
VLDL Cholesterol Cal: 19 mg/dL (ref 5–40)

## 2023-11-03 LAB — CBC WITH DIFFERENTIAL/PLATELET
Basophils Absolute: 0.1 10*3/uL (ref 0.0–0.2)
Basos: 1 %
EOS (ABSOLUTE): 0.2 10*3/uL (ref 0.0–0.4)
Eos: 3 %
Hematocrit: 41.7 % (ref 37.5–51.0)
Hemoglobin: 14.1 g/dL (ref 13.0–17.7)
Immature Grans (Abs): 0 10*3/uL (ref 0.0–0.1)
Immature Granulocytes: 0 %
Lymphocytes Absolute: 1.6 10*3/uL (ref 0.7–3.1)
Lymphs: 25 %
MCH: 31.3 pg (ref 26.6–33.0)
MCHC: 33.8 g/dL (ref 31.5–35.7)
MCV: 93 fL (ref 79–97)
Monocytes Absolute: 0.7 10*3/uL (ref 0.1–0.9)
Monocytes: 11 %
Neutrophils Absolute: 3.7 10*3/uL (ref 1.4–7.0)
Neutrophils: 60 %
Platelets: 201 10*3/uL (ref 150–450)
RBC: 4.51 x10E6/uL (ref 4.14–5.80)
RDW: 12.4 % (ref 11.6–15.4)
WBC: 6.2 10*3/uL (ref 3.4–10.8)

## 2023-11-03 LAB — MICROALBUMIN / CREATININE URINE RATIO
Creatinine, Urine: 263.5 mg/dL
Microalb/Creat Ratio: 5 mg/g{creat} (ref 0–29)
Microalbumin, Urine: 12.6 ug/mL

## 2023-11-03 LAB — TSH: TSH: 3.75 u[IU]/mL (ref 0.450–4.500)

## 2023-11-03 LAB — HEMOGLOBIN A1C
Est. average glucose Bld gHb Est-mCnc: 111 mg/dL
Hgb A1c MFr Bld: 5.5 % (ref 4.8–5.6)

## 2023-11-04 ENCOUNTER — Other Ambulatory Visit: Payer: Self-pay | Admitting: Family Medicine

## 2023-11-04 NOTE — Assessment & Plan Note (Signed)
 Management per specialist. Taking Eliquis 5 mg twice a day, Amiodarone 200 mg daily.

## 2023-11-04 NOTE — Assessment & Plan Note (Signed)
Continue ambien 10 mg before bed.  

## 2023-11-04 NOTE — Assessment & Plan Note (Signed)
 Previously well controlled Continue Synthroid at current dose  Recheck TSH and adjust Synthroid as indicated

## 2023-11-04 NOTE — Assessment & Plan Note (Signed)
 Well controlled.  No changes to medicines. Takes Amlodipine 5 mg daily and Valsartan 1 tablet 320 mg daily. Patient wears compression socks daily and takes Torsemide 20mg  daily. -Continue current regimen. Continue to work on eating a healthy diet and exercise.  Continue cardiopulmonary rehab. Labs drawn today.   Management per specialist.  Cardiology

## 2023-11-04 NOTE — Assessment & Plan Note (Signed)
 stable

## 2023-11-04 NOTE — Assessment & Plan Note (Signed)
Hemoglobin A1c 5.5%, 3 month avg of blood sugars, is in prediabetic range.  In order to prevent progression to diabetes, recommend low carb diet and regular exercise  

## 2023-11-04 NOTE — Assessment & Plan Note (Signed)
 Well controlled.  No changes to medicines. Atorvastatin 20 mg every day  Continue to work on eating a healthy diet and exercise.  Labs drawn today.

## 2023-11-05 ENCOUNTER — Encounter: Payer: Self-pay | Admitting: Family Medicine

## 2023-11-05 DIAGNOSIS — E66811 Obesity, class 1: Secondary | ICD-10-CM | POA: Insufficient documentation

## 2023-11-05 NOTE — Assessment & Plan Note (Signed)
 Recommend continue to work on eating healthy diet and exercise. Comorbidities: diabetes

## 2023-11-09 ENCOUNTER — Other Ambulatory Visit: Payer: Self-pay | Admitting: Family Medicine

## 2023-11-13 ENCOUNTER — Ambulatory Visit (INDEPENDENT_AMBULATORY_CARE_PROVIDER_SITE_OTHER): Admitting: Family Medicine

## 2023-11-13 ENCOUNTER — Encounter: Payer: Self-pay | Admitting: Family Medicine

## 2023-11-13 VITALS — BP 126/68 | HR 59 | Temp 98.4°F | Resp 16 | Ht 70.0 in | Wt 236.6 lb

## 2023-11-13 DIAGNOSIS — I110 Hypertensive heart disease with heart failure: Secondary | ICD-10-CM | POA: Diagnosis not present

## 2023-11-13 DIAGNOSIS — R0789 Other chest pain: Secondary | ICD-10-CM | POA: Insufficient documentation

## 2023-11-13 DIAGNOSIS — J302 Other seasonal allergic rhinitis: Secondary | ICD-10-CM

## 2023-11-13 DIAGNOSIS — N1831 Chronic kidney disease, stage 3a: Secondary | ICD-10-CM | POA: Diagnosis not present

## 2023-11-13 HISTORY — DX: Other seasonal allergic rhinitis: J30.2

## 2023-11-13 HISTORY — DX: Other chest pain: R07.89

## 2023-11-13 MED ORDER — MONTELUKAST SODIUM 10 MG PO TABS
10.0000 mg | ORAL_TABLET | Freq: Every day | ORAL | 3 refills | Status: DC
Start: 2023-11-13 — End: 2024-02-08

## 2023-11-13 MED ORDER — FLUTICASONE PROPIONATE 50 MCG/ACT NA SUSP: 2.0000 | Freq: Every day | NASAL | 6 refills | Status: DC

## 2023-11-13 MED ORDER — DEXTROMETHORPHAN HBR 15 MG/5ML PO SYRP: 10.0000 mL | ORAL_SOLUTION | Freq: Four times a day (QID) | ORAL | 0 refills | Status: DC | PRN

## 2023-11-13 NOTE — Assessment & Plan Note (Signed)
 Chronic Stopped John Kemp due to side effects, which can affect potassium and sodium levels. Blood pressure 126/68 mmHg. - labs ordered, Await labs/testing for assessment and recommendations

## 2023-11-13 NOTE — Assessment & Plan Note (Signed)
 Symptoms likely due to seasonal allergies or environmental changes. Benadryl provided some relief. - Prescribe Flonase nasal spray. - Prescribe Singulair for allergy management. - Prescribe cough syrup  - Advise to use an antihistamine other than Benadryl. - Instruct to monitor symptoms and consider antibiotics if persistent for another week.

## 2023-11-13 NOTE — Assessment & Plan Note (Addendum)
 Reports not feeling right since he started the [place] medication, patient holding his chest and not sure how to describe it. Extensive cardiac history EKG performed. Ventricular rate - 54, QRS - 122, QTc - 481. No ST elevation or depression. Consistent with last EKG done last year.

## 2023-11-13 NOTE — Progress Notes (Addendum)
 Acute Office Visit  Subjective:    Patient ID: John Kemp, male    DOB: July 31, 1946, 78 y.o.   MRN: [id number]  Chief Complaint  Patient presents with   Sinusitis    Discussed the use of AI scribe software for clinical note transcription with the patient, who gave verbal consent to proceed.  HPI: John Kemp is a 78 year old male with chronic bronchitis who presents with congestion and cough. He is accompanied by Amy, his wife.  He has been experiencing congestion and cough for approximately one week, with nasal discharge that is red, green, and yellow in color. No fever, loss of appetite, sore throat, ear pain, sinus pain, or difficulty swallowing. He has not been around anyone sick and has not been tested for flu or COVID recently. Benadryl taken several days ago provided some relief, but he has not used other antihistamines or medications for congestion recently.  He has a history of chronic bronchitis and allergies to Moxifloxacin, which he can't remember the reaction and prednisone, which caused irritability. Flonase has been used in the past, but he hasn't used it recently.   He recently stopped taking Kerendia due to side effects. He stated it was hard to explain, but that something wasn't right. He stated he took six days worth and then stopped it.  He participates in cardiac rehab three days a week and reports his heart rate is usually in the 50's, but otherwise spends much of his time sitting.  Past Medical History:  Diagnosis Date   A-fib (HCC)    Acquired thrombophilia (HCC) 11/17/2019   Acute bronchitis due to other specified organisms 10/28/2021   Acute cough 10/20/2021   Acute non-recurrent maxillary sinusitis 10/28/2021   Anticoagulated 01/21/2015   Atherosclerotic heart disease of native coronary artery without angina pectoris    Atrial fibrillation with rapid ventricular response (HCC) 11/12/2021   Atrial flutter with rapid ventricular response (HCC)  11/12/2021   CAD in native artery 01/21/2015   Cancer [facility])    skin cancer   Carotid artery occlusion    CHF (congestive heart failure) (HCC) 11/05/2019   Chronic diastolic (congestive) heart failure (HCC)    Chronic diastolic heart failure (HCC) 01/21/2015   Last Assessment & Plan:  Is congestive heart failure is mildly decompensated he is edematous he'll increase the dose of his diuretic today the lab work performed including an ARB referred to care transitions for home care he'll follow-up in my office with me in 4-6 weeks daily weights sodium restrictionand compliance of medications beinghemoglobin of will   Chronic obstructive pulmonary disease (COPD) (HCC)    Coronary artery disease involving autologous artery coronary bypass graft 11/12/2021   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 11/17/2019   Dizziness 11/12/2021   Dyslipidemia 11/12/2021   Elevated brain natriuretic peptide (BNP) level 11/12/2021   Essential hypertension 01/21/2015   right superior quadrantanopsia   Hearing loss of right ear due to cerumen impaction 10/20/2021   High cholesterol 10/20/2017   Hx of CABG 03/02/2017   Hypertension    Hypertensive heart disease with heart failure (HCC)    Hypothyroidism (acquired) 04/24/2018   Impaired fasting glucose    Left rotator cuff tear 11/12/2021   Mixed hyperlipidemia 11/17/2019   Myocardial infarction (HCC)    Non-seasonal allergic rhinitis due to pollen 10/20/2021   On amiodarone therapy 01/21/2015   PAF (paroxysmal atrial fibrillation) (HCC)    Primary insomnia    Sequela of cardioembolic stroke  11/17/2019   Severe obesity with body mass index (BMI) of 36.0 to 36.9 with serious comorbidity (HCC)    Squamous cell carcinoma 11/05/2019   Typical atrial flutter (HCC) 01/21/2015   Vitamin D deficiency, unspecified     Past Surgical History:  Procedure Laterality Date   APPENDECTOMY  1956   CARDIAC CATHETERIZATION     CARDIOVERSION  2002    CHOLECYSTECTOMY  2006   CORONARY ARTERY BYPASS GRAFT  2001   HERNIA REPAIR     RADIOLOGY WITH ANESTHESIA Bilateral 01/18/2018   Procedure: MRI WITH ANESTHESIA LUMBAR SPINE WITH AND WITHOUT CONTRAST;  Surgeon: Radiologist, Medication, MD;  Location: [facility] OR;  Service: Radiology;  Laterality: Bilateral;   RADIOLOGY WITH ANESTHESIA N/A 04/19/2022   Procedure: MRI WITH BRAIN WITH AND WITHOUT CONTRAST;  Surgeon: Radiologist, Medication, MD;  Location: [facility] OR;  Service: Radiology;  Laterality: N/A;   ROTATOR CUFF REPAIR Left    TONSILLECTOMY  1975    Family History  Problem Relation Age of Onset   Heart disease Father    Arrhythmia Father    Breast cancer Mother    Diabetes type II Sister    Diabetes Brother     Social History   Socioeconomic History   Marital status: Married    Spouse name: Amalia Badder   Number of children: 2   Years of education: Not on file   Highest education level: Not on file  Occupational History   Occupation: Retired  Tobacco Use   Smoking status: Former    Current packs/day: 0.00    Average packs/day: 1 pack/day for 15.0 years (15.0 ttl pk-yrs)    Types: Cigarettes    Start date: 08/30/1963    Quit date: 08/29/1978    Years since quitting: 45.3    Passive exposure: Past   Smokeless tobacco: Never  Vaping Use   Vaping status: Never Used  Substance and Sexual Activity   Alcohol use: No    Alcohol/week: 0.0 standard drinks of alcohol   Drug use: No   Sexual activity: Not Currently  Other Topics Concern   Not on file  Social History Narrative   Not on file   Social Drivers of Health   Financial Resource Strain: Low Risk  (09/11/2023)   Overall Financial Resource Strain (CARDIA)    Difficulty of Paying Living Expenses: Not very hard  Food Insecurity: No Food Insecurity (09/11/2023)   Hunger Vital Sign    Worried About Running Out of Food in the Last Year: Never true    Ran Out of Food in the Last Year: Never true  Transportation Needs: No Transportation  Needs (09/11/2023)   PRAPARE - Administrator, Civil Service (Medical): No    Lack of Transportation (Non-Medical): No  Physical Activity: Sufficiently Active (09/11/2023)   Exercise Vital Sign    Days of Exercise per Week: 4 days    Minutes of Exercise per Session: 50 min  Stress: No Stress Concern Present (09/11/2023)   [employer] of Occupational Health - Occupational Stress Questionnaire    Feeling of Stress : Not at all  Social Connections: Moderately Integrated (11/02/2023)   Social Connection and Isolation Panel [NHANES]    Frequency of Communication with Friends and Family: Three times a week    Frequency of Social Gatherings with Friends and Family: Three times a week    Attends Religious Services: More than 4 times per year    Active Member of Clubs or Organizations: No  Attends Banker Meetings: Never    Marital Status: Married  Catering manager Violence: Not At Risk (09/11/2023)   Humiliation, Afraid, Rape, and Kick questionnaire    Fear of Current or Ex-Partner: No    Emotionally Abused: No    Physically Abused: No    Sexually Abused: No    Outpatient Medications Prior to Visit  Medication Sig Dispense Refill   acetaminophen (TYLENOL) 500 MG tablet Take 500 mg by mouth 2 (two) times daily.     amiodarone (PACERONE) 200 MG tablet Take 1 tablet (200 mg total) by mouth daily. 90 tablet 2   amLODipine (NORVASC) 5 MG tablet TAKE 1 TABLET (5 MG TOTAL) BY MOUTH DAILY. 90 tablet 2   atorvastatin (LIPITOR) 20 MG tablet TAKE 1 TABLET BY MOUTH EVERY DAY 90 tablet 1   cyanocobalamin (VITAMIN B12) 1000 MCG/ML injection INJECT 1 ML (1,000 MCG TOTAL) INTO THE MUSCLE EVERY 30 DAYS. 3 mL 0   ELIQUIS 5 MG TABS tablet TAKE 1 TABLET BY MOUTH TWICE A DAY 60 tablet 5   finasteride (PROSCAR) 5 MG tablet Take 1 tablet (5 mg total) by mouth daily. 90 tablet 1   Inulin (FIBER CHOICE PO) Take 3 tablets by mouth daily as needed (Constipation).     ketoconazole  (NIZORAL) 2 % shampoo Apply 1 Application topically 3 (three) times a week.     levothyroxine (SYNTHROID) 112 MCG tablet TAKE 1 TABLET BY MOUTH EVERY DAY 90 tablet 3   nitroGLYCERIN (NITROSTAT) 0.4 MG SL tablet Place 1 tablet (0.4 mg total) under the tongue every 5 (five) minutes as needed for chest pain. 25 tablet 2   nystatin cream (MYCOSTATIN) Apply 1 Application topically 2 (two) times daily.     SYRINGE-NEEDLE, DISP, 3 ML (BD ECLIPSE SYRINGE/NEEDLE) 25G X 5/8" 3 ML MISC Inject 1 ml of B12 every week for 4 weeks 50 each 0   tamsulosin (FLOMAX) 0.4 MG CAPS capsule TAKE 1 CAPSULE BY MOUTH EVERY DAY 90 capsule 1   torsemide (DEMADEX) 20 MG tablet TAKE 1 TABLET BY MOUTH EVERY DAY 90 tablet 0   triamcinolone cream (KENALOG) 0.1 % Apply 1 Application topically 2 (two) times daily.     valsartan (DIOVAN) 320 MG tablet TAKE 1 TABLET BY MOUTH EVERY DAY 90 tablet 1   zolpidem (AMBIEN) 10 MG tablet Take 1 tablet (10 mg total) by mouth at bedtime. 90 tablet 1   ELIQUIS 5 MG TABS tablet TAKE 1 TABLET BY MOUTH TWICE A DAY (Patient taking differently: Take 5 mg by mouth 2 (two) times daily.) 60 tablet 5   fluticasone (FLONASE) 50 MCG/ACT nasal spray Place 2 sprays into both nostrils daily. Use in addition to azelastine. 16 g 6   Vitamin D, Ergocalciferol, (DRISDOL) 1.25 MG (50000 UNIT) CAPS capsule Take 1 capsule (50,000 Units total) by mouth every 7 (seven) days. 12 capsule 2   No facility-administered medications prior to visit.    Allergies  Allergen Reactions   Pravastatin Other (See Comments)    Other Reaction(s): Other (See Comments)  myalgias, myalgia  Other Reaction(s): Other (See Comments)    myalgias, myalgia    myalgias myalgia    myalgia    Other Reaction(s): Other (See Comments)  myalgias, myalgia    myalgias   Prednisone Other (See Comments) and Anxiety    "makes me feel terrible"  Other Reaction(s): Angioedema, Other (See Comments)  Pt feels out of regular routine, "makes  me feel terrible"  Other Reaction(s): Angioedema,  Other (See Comments)    Pt feels out of regular routine, "makes me feel terrible"    Pt feels out of regular routine    Pt feels out of regular routine "makes me feel terrible"    "makes me feel terrible"  Other Reaction(s): Angioedema, Other (See Comments)  Pt feels out of regular routine, "makes me feel terrible"   Diltiazem Hcl Nausea And Vomiting and Other (See Comments)   Ezetimibe Other (See Comments)    myalgias  Other Reaction(s): Other (See Comments)    myalgias   Ethelene Herald [Finerenone]     Chest pain   Moxifloxacin Hcl Other (See Comments)    Unknown  Other Reaction(s): Other (See Comments)    Pt reports unknown reaction    Unknown   Pravachol [Pravastatin Sodium] Other (See Comments)    myalgia    Review of Systems  Constitutional:  Negative for appetite change, fatigue and fever.  HENT:  Positive for congestion and sinus pressure. Negative for ear pain, sinus pain, sneezing, sore throat, trouble swallowing and voice change.   Respiratory:  Positive for cough and chest tightness. Negative for shortness of breath and wheezing.   Cardiovascular:  Negative for chest pain and palpitations.  Gastrointestinal:  Negative for abdominal pain, constipation, diarrhea, nausea and vomiting.  Genitourinary:  Negative for dysuria and hematuria.  Musculoskeletal:  Negative for arthralgias, back pain, joint swelling and myalgias.  Skin:  Negative for rash.  Neurological:  Negative for dizziness, weakness and headaches.  Psychiatric/Behavioral:  Negative for dysphoric mood. The patient is not nervous/anxious.        Objective:        12/11/2023    9:33 AM 11/13/2023    8:12 AM 11/02/2023    8:16 AM  Vitals with BMI  Height 5\' 10"  5\' 10"  5\' 10"   Weight 233 lbs 236 lbs 10 oz 236 lbs  BMI 33.43 33.95 33.86  Systolic 134 126 161  Diastolic 64 68 72  Pulse 55 59 61    No data found.   Physical Exam Constitutional:       General: He is not in acute distress.    Appearance: Normal appearance. He is ill-appearing.  HENT:     Right Ear: There is impacted cerumen.     Left Ear: Tympanic membrane normal.     Nose:     Right Turbinates: Not swollen.     Left Turbinates: Not swollen.     Right Sinus: No maxillary sinus tenderness or frontal sinus tenderness.     Left Sinus: No maxillary sinus tenderness or frontal sinus tenderness.     Mouth/Throat:     Pharynx: Posterior oropharyngeal erythema present.  Eyes:     Conjunctiva/sclera: Conjunctivae normal.  Cardiovascular:     Rate and Rhythm: Regular rhythm. Bradycardia present.     Heart sounds: Normal heart sounds. No murmur heard. Pulmonary:     Effort: Pulmonary effort is normal. No respiratory distress.     Breath sounds: Normal breath sounds. No wheezing.  Chest:     Chest wall: No tenderness.  Musculoskeletal:        General: Normal range of motion.     Cervical back: Normal range of motion.  Lymphadenopathy:     Cervical: No cervical adenopathy.  Skin:    General: Skin is warm.  Neurological:     Mental Status: He is alert. Mental status is at baseline.  Psychiatric:        Mood and Affect:  Mood normal.        Behavior: Behavior normal.     Health Maintenance Due  Topic Date Due   Zoster Vaccines- Shingrix (1 of 2) Never done    There are no preventive care reminders to display for this patient.   Lab Results  Component Value Date   TSH 3.750 11/02/2023   Lab Results  Component Value Date   WBC 5.0 11/13/2023   HGB 13.3 11/13/2023   HCT 39.6 11/13/2023   MCV 94 11/13/2023   PLT 201 11/13/2023   Lab Results  Component Value Date   NA 140 12/05/2023   K 4.3 12/05/2023   CO2 24 12/05/2023   GLUCOSE 88 12/05/2023   BUN 18 12/05/2023   CREATININE 1.57 (H) 12/05/2023   BILITOT 0.8 12/05/2023   ALKPHOS 103 12/05/2023   AST 19 12/05/2023   ALT 18 12/05/2023   PROT 6.7 12/05/2023   ALBUMIN 4.3 12/05/2023   CALCIUM 9.3  12/05/2023   ANIONGAP 10 04/19/2022   EGFR 46 (L) 12/05/2023   Lab Results  Component Value Date   CHOL 136 11/02/2023   Lab Results  Component Value Date   HDL 45 11/02/2023   Lab Results  Component Value Date   LDLCALC 72 11/02/2023   Lab Results  Component Value Date   TRIG 101 11/02/2023   Lab Results  Component Value Date   CHOLHDL 3.0 11/02/2023   Lab Results  Component Value Date   HGBA1C 5.4 12/05/2023       Assessment & Plan:  Chest tightness Assessment & Plan: Reports not feeling right since he started the [place] medication, patient holding his chest and not sure how to describe it. Extensive cardiac history EKG performed. Ventricular rate - 54, QRS - 122, QTc - 481. No ST elevation or depression. Consistent with last EKG done last year.    Orders: -     Comprehensive metabolic panel with GFR -     CBC with Differential/Platelet -     EKG 12-Lead  Seasonal allergies Assessment & Plan: Symptoms likely due to seasonal allergies or environmental changes. Benadryl provided some relief. - Prescribe Flonase nasal spray. - Prescribe Singulair for allergy management. - Prescribe cough syrup  - Advise to use an antihistamine other than Benadryl. - Instruct to monitor symptoms and consider antibiotics if persistent for another week.  Orders: -     Montelukast Sodium; Take 1 tablet (10 mg total) by mouth at bedtime.  Dispense: 30 tablet; Refill: 3 -     Fluticasone Propionate; Place 2 sprays into both nostrils daily. Use in addition to azelastine.  Dispense: 16 g; Refill: 6 -     Dextromethorphan HBr; Take 10 mLs (30 mg total) by mouth 4 (four) times daily as needed for cough.  Dispense: 120 mL; Refill: 0  Stage 3a chronic kidney disease (HCC) Assessment & Plan: Chronic Stopped Kerendia due to side effects, which can affect potassium and sodium levels. Blood pressure 126/68 mmHg. - labs ordered, Await labs/testing for assessment and  recommendations      Meds ordered this encounter  Medications   montelukast (SINGULAIR) 10 MG tablet    Sig: Take 1 tablet (10 mg total) by mouth at bedtime.    Dispense:  30 tablet    Refill:  3   fluticasone (FLONASE) 50 MCG/ACT nasal spray    Sig: Place 2 sprays into both nostrils daily. Use in addition to azelastine.    Dispense:  16 g  Refill:  6    Please tell patient to use both flonase and azelastine since dymista is not covered. Dr. Cox   dextromethorphan 15 MG/5ML syrup    Sig: Take 10 mLs (30 mg total) by mouth 4 (four) times daily as needed for cough.    Dispense:  120 mL    Refill:  0    Orders Placed This Encounter  Procedures   Comprehensive metabolic panel   CBC with Differential   EKG 12-Lead     Follow-up: Return if symptoms worsen or fail to improve.  An After Visit Summary was printed and given to the patient.  Total time spent on today's visit was 45 minutes, including both face-to-face time and nonface-to-face time personally spent on review of chart, discussing EKG results and comparing them to previous records, discussing further work-up and labs, treatment options, and answering patient's questions.  Delford Felling, FNP [facility] [phone]

## 2023-11-14 ENCOUNTER — Telehealth: Payer: Self-pay | Admitting: Cardiology

## 2023-11-14 NOTE — Telephone Encounter (Signed)
 Recommendations reviewed with Darl Pikes per DPR as per Dr. Madireddy's note. Darl Pikes verbalized understanding and had no additional questions.

## 2023-11-14 NOTE — Telephone Encounter (Signed)
 Spoke with pt who states that he started [place] for kidney dx last week. Pt states that after starting his medication he had trouble taking a good breath. Pt states that he stopped the medication after 6 doses and sx resolved. Pt states that he had an EKG done yesterday with his PCP and he would like it to be reviewed and advise on results. Pt denies chest pain or other sx.

## 2023-11-14 NOTE — Telephone Encounter (Signed)
 Patient would like a call back to discuss EKG taken at PCP's office.

## 2023-11-21 LAB — CBC WITH DIFFERENTIAL/PLATELET
Basophils Absolute: 0.1 10*3/uL (ref 0.0–0.2)
Basos: 1 %
EOS (ABSOLUTE): 0.1 10*3/uL (ref 0.0–0.4)
Eos: 3 %
Hematocrit: 39.6 % (ref 37.5–51.0)
Hemoglobin: 13.3 g/dL (ref 13.0–17.7)
Immature Grans (Abs): 0 10*3/uL (ref 0.0–0.1)
Immature Granulocytes: 0 %
Lymphocytes Absolute: 1.4 10*3/uL (ref 0.7–3.1)
Lymphs: 27 %
MCH: 31.5 pg (ref 26.6–33.0)
MCHC: 33.6 g/dL (ref 31.5–35.7)
MCV: 94 fL (ref 79–97)
Monocytes Absolute: 0.5 10*3/uL (ref 0.1–0.9)
Monocytes: 10 %
Neutrophils Absolute: 2.9 10*3/uL (ref 1.4–7.0)
Neutrophils: 59 %
Platelets: 201 10*3/uL (ref 150–450)
RBC: 4.22 x10E6/uL (ref 4.14–5.80)
RDW: 12.9 % (ref 11.6–15.4)
WBC: 5 10*3/uL (ref 3.4–10.8)

## 2023-11-21 LAB — COMPREHENSIVE METABOLIC PANEL
ALT: 9 IU/L (ref 0–44)
AST: 16 IU/L (ref 0–40)
Albumin: 4.6 g/dL (ref 3.8–4.8)
Alkaline Phosphatase: 128 IU/L — ABNORMAL HIGH (ref 44–121)
BUN/Creatinine Ratio: 12 (ref 10–24)
BUN: 19 mg/dL (ref 8–27)
Bilirubin Total: <0.2 mg/dL (ref 0.0–1.2)
CO2: 19 mmol/L — ABNORMAL LOW (ref 20–29)
Calcium: 8.8 mg/dL (ref 8.6–10.2)
Chloride: 112 mmol/L — ABNORMAL HIGH (ref 96–106)
Creatinine, Ser: 1.55 mg/dL — ABNORMAL HIGH (ref 0.76–1.27)
Globulin, Total: 2.7 g/dL (ref 1.5–4.5)
Glucose: 104 mg/dL — ABNORMAL HIGH (ref 70–99)
Potassium: 4.2 mmol/L (ref 3.5–5.2)
Total Protein: 7.3 g/dL (ref 6.0–8.5)
eGFR: 46 mL/min/{1.73_m2} — ABNORMAL LOW (ref >59)

## 2023-11-22 ENCOUNTER — Telehealth: Payer: Self-pay

## 2023-11-22 NOTE — Telephone Encounter (Signed)
 Patient aware of lab results. He is agreed to having his Vit D and A1c checked at his next visit.

## 2023-11-23 NOTE — Telephone Encounter (Signed)
 Spoke with Dr. Sedalia Muta who is in agreeance with Norwood Levo regarding patients kidney function--stable. LVM informing patient if this information.

## 2023-11-24 ENCOUNTER — Other Ambulatory Visit: Payer: Self-pay | Admitting: Family Medicine

## 2023-11-24 DIAGNOSIS — E782 Mixed hyperlipidemia: Secondary | ICD-10-CM

## 2023-11-28 ENCOUNTER — Other Ambulatory Visit: Payer: Self-pay

## 2023-11-28 DIAGNOSIS — E559 Vitamin D deficiency, unspecified: Secondary | ICD-10-CM

## 2023-11-28 DIAGNOSIS — R739 Hyperglycemia, unspecified: Secondary | ICD-10-CM

## 2023-11-28 DIAGNOSIS — N1831 Chronic kidney disease, stage 3a: Secondary | ICD-10-CM

## 2023-12-04 ENCOUNTER — Other Ambulatory Visit: Payer: Self-pay | Admitting: Family Medicine

## 2023-12-04 DIAGNOSIS — J302 Other seasonal allergic rhinitis: Secondary | ICD-10-CM

## 2023-12-04 MED ORDER — AZELASTINE HCL 0.1 % NA SOLN: 1.0000 | Freq: Two times a day (BID) | NASAL | 12 refills | Status: AC

## 2023-12-05 ENCOUNTER — Other Ambulatory Visit

## 2023-12-05 DIAGNOSIS — N1831 Chronic kidney disease, stage 3a: Secondary | ICD-10-CM | POA: Diagnosis not present

## 2023-12-05 DIAGNOSIS — E559 Vitamin D deficiency, unspecified: Secondary | ICD-10-CM

## 2023-12-05 DIAGNOSIS — R739 Hyperglycemia, unspecified: Secondary | ICD-10-CM | POA: Diagnosis not present

## 2023-12-06 LAB — COMPREHENSIVE METABOLIC PANEL WITH GFR
ALT: 18 IU/L (ref 0–44)
AST: 19 IU/L (ref 0–40)
Albumin: 4.3 g/dL (ref 3.8–4.8)
Alkaline Phosphatase: 103 IU/L (ref 44–121)
BUN/Creatinine Ratio: 11 (ref 10–24)
BUN: 18 mg/dL (ref 8–27)
Bilirubin Total: 0.8 mg/dL (ref 0.0–1.2)
CO2: 24 mmol/L (ref 20–29)
Calcium: 9.3 mg/dL (ref 8.6–10.2)
Chloride: 100 mmol/L (ref 96–106)
Creatinine, Ser: 1.57 mg/dL — ABNORMAL HIGH (ref 0.76–1.27)
Globulin, Total: 2.4 g/dL (ref 1.5–4.5)
Glucose: 88 mg/dL (ref 70–99)
Potassium: 4.3 mmol/L (ref 3.5–5.2)
Sodium: 140 mmol/L (ref 134–144)
Total Protein: 6.7 g/dL (ref 6.0–8.5)
eGFR: 46 mL/min/{1.73_m2} — ABNORMAL LOW (ref >59)

## 2023-12-06 LAB — HEMOGLOBIN A1C
Est. average glucose Bld gHb Est-mCnc: 108 mg/dL
Hgb A1c MFr Bld: 5.4 % (ref 4.8–5.6)

## 2023-12-06 LAB — VITAMIN D 25 HYDROXY (VIT D DEFICIENCY, FRACTURES): Vit D, 25-Hydroxy: 37.6 ng/mL (ref 30.0–100.0)

## 2023-12-10 NOTE — Progress Notes (Unsigned)
 Subjective:  Patient ID: John Kemp, male    DOB: [birth year]  Age: 78 y.o. MRN: [id number]  Chief Complaint  Patient presents with   Abnormal Kidney Function   Discussed the use of AI scribe software for clinical note transcription with the patient, who gave verbal consent to proceed.  History of Present Illness   The patient, with a history of coronary artery disease and chronic kidney disease stage 3B, presents with concerns about his kidney function. He reports a previous trial of Kerendia , which was discontinued after six days due to chest discomfort. He expresses a desire for referral to a nephrologist, specifically Dr. Leandra Pro at [facility] and [place], due to a family history of kidney cancer.  The patient also mentions a history of taking Ozempic , a diabetes medication, which he discontinued due to nausea and cost. He is currently on valsartan  for blood pressure control, which also has beneficial effects on kidney function. He denies use of nephrotoxic medications such as ibuprofen or Aleve, but does take Tylenol  as needed.      Patient states he has concerns about his kidney function and would like to be referred to a Nephrologist [facility], Dr. Christianne Cowper.)  tried kerendia , but did not like how it made him feel. Caused chest discomfort. Took 6 days worth and it happened every day. Patient has known CORONARY ARTERY DISEASE.        11/02/2023    8:48 AM 09/11/2023    2:02 PM 07/24/2023    8:33 AM 07/24/2023    8:03 AM 01/11/2023    8:28 AM  Depression screen PHQ 2/9  Decreased Interest 0 0 0 0 0  Down, Depressed, Hopeless 0 0 0 0 0  PHQ - 2 Score 0 0 0 0 0  Altered sleeping     0  Tired, decreased energy     0  Change in appetite     0  Feeling bad or failure about yourself      0  Trouble concentrating     0  Moving slowly or fidgety/restless     0  Suicidal thoughts     0  PHQ-9 Score     0  Difficult doing work/chores     Not difficult at all         11/02/2023    8:48 AM  Fall Risk   Falls in the past year? 0  Number falls in past yr: 0  Injury with Fall? 0  Risk for fall due to : History of fall(s)  Follow up Falls evaluation completed;Falls prevention discussed    Patient Care Team: Mercy Stall, MD as PCP - General (Family Medicine) Devon Fogo, [facility] (Inactive) as Pharmacist (Pharmacist) Maria Shiner, AUD (Audiology) Hassan Links, MD as Consulting Physician (Cardiology)   Review of Systems  Current Outpatient Medications on File Prior to Visit  Medication Sig Dispense Refill   acetaminophen  (TYLENOL ) 500 MG tablet Take 500 mg by mouth 2 (two) times daily.     amiodarone  (PACERONE ) 200 MG tablet Take 1 tablet (200 mg total) by mouth daily. 90 tablet 2   amLODipine  (NORVASC ) 5 MG tablet TAKE 1 TABLET (5 MG TOTAL) BY MOUTH DAILY. 90 tablet 2   atorvastatin  (LIPITOR) 20 MG tablet TAKE 1 TABLET BY MOUTH EVERY DAY 90 tablet 1   azelastine  (ASTELIN ) 0.1 % nasal spray Place 1 spray into both nostrils 2 (two) times daily. Use in each nostril as directed 30 mL  12   cyanocobalamin  (VITAMIN B12) 1000 MCG/ML injection INJECT 1 ML (1,000 MCG TOTAL) INTO THE MUSCLE EVERY 30 DAYS. 3 mL 0   dextromethorphan  15 MG/5ML syrup Take 10 mLs (30 mg total) by mouth 4 (four) times daily as needed for cough. 120 mL 0   ELIQUIS  5 MG TABS tablet TAKE 1 TABLET BY MOUTH TWICE A DAY 60 tablet 5   finasteride  (PROSCAR ) 5 MG tablet Take 1 tablet (5 mg total) by mouth daily. 90 tablet 1   fluticasone  (FLONASE ) 50 MCG/ACT nasal spray Place 2 sprays into both nostrils daily. Use in addition to azelastine . 16 g 6   Inulin (FIBER CHOICE PO) Take 3 tablets by mouth daily as needed (Constipation).     ketoconazole (NIZORAL) 2 % shampoo Apply 1 Application topically 3 (three) times a week.     levothyroxine  (SYNTHROID ) 112 MCG tablet TAKE 1 TABLET BY MOUTH EVERY DAY 90 tablet 3   montelukast  (SINGULAIR ) 10 MG tablet Take 1 tablet (10 mg total) by  mouth at bedtime. 30 tablet 3   nitroGLYCERIN  (NITROSTAT ) 0.4 MG SL tablet Place 1 tablet (0.4 mg total) under the tongue every 5 (five) minutes as needed for chest pain. 25 tablet 2   nystatin cream (MYCOSTATIN) Apply 1 Application topically 2 (two) times daily.     SYRINGE-NEEDLE, DISP, 3 ML (BD ECLIPSE SYRINGE/NEEDLE) 25G X 5/8" 3 ML MISC Inject 1 ml of B12 every week for 4 weeks 50 each 0   tamsulosin  (FLOMAX ) 0.4 MG CAPS capsule TAKE 1 CAPSULE BY MOUTH EVERY DAY 90 capsule 1   torsemide  (DEMADEX ) 20 MG tablet TAKE 1 TABLET BY MOUTH EVERY DAY 90 tablet 0   triamcinolone  cream (KENALOG ) 0.1 % Apply 1 Application topically 2 (two) times daily.     valsartan  (DIOVAN ) 320 MG tablet TAKE 1 TABLET BY MOUTH EVERY DAY 90 tablet 1   Vitamin D , Ergocalciferol , (DRISDOL ) 1.25 MG (50000 UNIT) CAPS capsule TAKE 1 CAPSULE (50,000 UNITS TOTAL) BY MOUTH EVERY 7 (SEVEN) DAYS 12 capsule 2   zolpidem  (AMBIEN ) 10 MG tablet Take 1 tablet (10 mg total) by mouth at bedtime. 90 tablet 1   No current facility-administered medications on file prior to visit.   Past Medical History:  Diagnosis Date   A-fib (HCC)    Acquired thrombophilia (HCC) 11/17/2019   Acute bronchitis due to other specified organisms 10/28/2021   Acute cough 10/20/2021   Acute non-recurrent maxillary sinusitis 10/28/2021   Anticoagulated 01/21/2015   Atherosclerotic heart disease of native coronary artery without angina pectoris    Atrial fibrillation with rapid ventricular response (HCC) 11/12/2021   Atrial flutter with rapid ventricular response (HCC) 11/12/2021   CAD in native artery 01/21/2015   Cancer [facility])    skin cancer   Carotid artery occlusion    CHF (congestive heart failure) (HCC) 11/05/2019   Chronic diastolic (congestive) heart failure (HCC)    Chronic diastolic heart failure (HCC) 01/21/2015   Last Assessment & Plan:  Is congestive heart failure is mildly decompensated he is edematous he'll increase the dose of his  diuretic today the lab work performed including an ARB referred to care transitions for home care he'll follow-up in my office with me in 4-6 weeks daily weights sodium restrictionand compliance of medications beinghemoglobin of will   Chronic obstructive pulmonary disease (COPD) (HCC)    Coronary artery disease involving autologous artery coronary bypass graft 11/12/2021   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 11/17/2019   Dizziness  11/12/2021   Dyslipidemia 11/12/2021   Elevated brain natriuretic peptide (BNP) level 11/12/2021   Essential hypertension 01/21/2015   right superior quadrantanopsia   Hearing loss of right ear due to cerumen impaction 10/20/2021   High cholesterol 10/20/2017   Hx of CABG 03/02/2017   Hypertension    Hypertensive heart disease with heart failure (HCC)    Hypothyroidism (acquired) 04/24/2018   Impaired fasting glucose    Left rotator cuff tear 11/12/2021   Mixed hyperlipidemia 11/17/2019   Myocardial infarction (HCC)    Non-seasonal allergic rhinitis due to pollen 10/20/2021   On amiodarone  therapy 01/21/2015   PAF (paroxysmal atrial fibrillation) (HCC)    Primary insomnia    Sequela of cardioembolic stroke 11/17/2019   Severe obesity with body mass index (BMI) of 36.0 to 36.9 with serious comorbidity (HCC)    Squamous cell carcinoma 11/05/2019   Typical atrial flutter (HCC) 01/21/2015   Vitamin D  deficiency, unspecified    Past Surgical History:  Procedure Laterality Date   APPENDECTOMY  1956   CARDIAC CATHETERIZATION     CARDIOVERSION  2002   CHOLECYSTECTOMY  2006   CORONARY ARTERY BYPASS GRAFT  2001   HERNIA REPAIR     RADIOLOGY WITH ANESTHESIA Bilateral 01/18/2018   Procedure: MRI WITH ANESTHESIA LUMBAR SPINE WITH AND WITHOUT CONTRAST;  Surgeon: Radiologist, Medication, MD;  Location: [facility] OR;  Service: Radiology;  Laterality: Bilateral;   RADIOLOGY WITH ANESTHESIA N/A 04/19/2022   Procedure: MRI WITH BRAIN WITH AND WITHOUT  CONTRAST;  Surgeon: Radiologist, Medication, MD;  Location: [facility] OR;  Service: Radiology;  Laterality: N/A;   ROTATOR CUFF REPAIR Left    TONSILLECTOMY  1975    Family History  Problem Relation Age of Onset   Heart disease Father    Arrhythmia Father    Breast cancer Mother    Diabetes type II Sister    Diabetes Brother    Social History   Socioeconomic History   Marital status: Married    Spouse name: Amalia Badder   Number of children: 2   Years of education: Not on file   Highest education level: Not on file  Occupational History   Occupation: Retired  Tobacco Use   Smoking status: Former    Current packs/day: 0.00    Average packs/day: 1 pack/day for 15.0 years (15.0 ttl pk-yrs)    Types: Cigarettes    Start date: 08/30/1963    Quit date: 08/29/1978    Years since quitting: 45.3    Passive exposure: Past   Smokeless tobacco: Never  Vaping Use   Vaping status: Never Used  Substance and Sexual Activity   Alcohol use: No    Alcohol/week: 0.0 standard drinks of alcohol   Drug use: No   Sexual activity: Not Currently  Other Topics Concern   Not on file  Social History Narrative   Not on file   Social Drivers of Health   Financial Resource Strain: Low Risk  (09/11/2023)   Overall Financial Resource Strain (CARDIA)    Difficulty of Paying Living Expenses: Not very hard  Food Insecurity: No Food Insecurity (09/11/2023)   Hunger Vital Sign    Worried About Running Out of Food in the Last Year: Never true    Ran Out of Food in the Last Year: Never true  Transportation Needs: No Transportation Needs (09/11/2023)   PRAPARE - Administrator, Civil Service (Medical): No    Lack of Transportation (Non-Medical): No  Physical Activity: Sufficiently Active (09/11/2023)  Exercise Vital Sign    Days of Exercise per Week: 4 days    Minutes of Exercise per Session: 50 min  Stress: No Stress Concern Present (09/11/2023)   [employer] of Occupational Health - Occupational  Stress Questionnaire    Feeling of Stress : Not at all  Social Connections: Moderately Integrated (11/02/2023)   Social Connection and Isolation Panel [NHANES]    Frequency of Communication with Friends and Family: Three times a week    Frequency of Social Gatherings with Friends and Family: Three times a week    Attends Religious Services: More than 4 times per year    Active Member of Clubs or Organizations: No    Attends Banker Meetings: Never    Marital Status: Married    Objective:  BP 134/64   Pulse (!) 55   Temp 98.2 F (36.8 C)   Ht 5\' 10"  (1.778 m)   Wt 233 lb (105.7 kg)   SpO2 97%   BMI 33.43 kg/m      12/11/2023    9:33 AM 11/13/2023    8:12 AM 11/02/2023    8:16 AM  BP/Weight  Systolic BP 134 126 126  Diastolic BP 64 68 72  Wt. (Lbs) 233 236.6 236  BMI 33.43 kg/m2 33.95 kg/m2 33.86 kg/m2    Physical Exam  Diabetic Foot Exam - Simple   No data filed      Lab Results  Component Value Date   WBC 5.0 11/13/2023   HGB 13.3 11/13/2023   HCT 39.6 11/13/2023   PLT 201 11/13/2023   GLUCOSE 88 12/05/2023   CHOL 136 11/02/2023   TRIG 101 11/02/2023   HDL 45 11/02/2023   LDLCALC 72 11/02/2023   ALT 18 12/05/2023   AST 19 12/05/2023   NA 140 12/05/2023   K 4.3 12/05/2023   CL 100 12/05/2023   CREATININE 1.57 (H) 12/05/2023   BUN 18 12/05/2023   CO2 24 12/05/2023   TSH 3.750 11/02/2023   INR 1.1 02/02/2022   HGBA1C 5.4 12/05/2023   MICROALBUR Positive 150 01/23/2019      Assessment & Plan:  Assessment and Plan    Chronic Kidney Disease, Stage 3B CKD stage 3B with concerns about renal function. He experienced chest discomfort with Kerendia , leading to discontinuation. He is interested in evaluation by Dr. Leandra Pro at [facility] and [place]. Discussed referral process and potential wait times based on severity and progression of renal function. Renal function is well-managed, and a renal ultrasound has not been performed yet.  Referral prioritization is based on severity and progression, with an expected wait time of 3-4 months. - Order renal ultrasound - Refer to nephrology, specifically Dr. Leandra Pro at [facility] and [place]  Coronary Artery Disease Coronary artery disease is present. Discussed potential use of Ozempic  for renal and cardiac benefits. He previously experienced nausea with Ozempic  and found it costly. Discussed potential benefits for weight loss and renal function, despite previous side effects. Ozempic  may require prior authorization, and Wegovy  might be considered if Ozempic  is unsuitable due to his non-diabetic status. - Provide Ozempic  samples for trial - Consider prior authorization for Wegovy  if Ozempic  is not suitable  General Health Maintenance A1c is excellent at 5, indicating good glycemic control. Discussed importance of avoiding NSAIDs like ibuprofen and Aleve, which can adversely affect renal function. Tylenol  is acceptable for pain management. - Advise against NSAID use, recommend Tylenol  for pain  CKD stage 3b, GFR 30-44 ml/min (HCC) Assessment & Plan: Order renal ultrasound Referring to nephrologist.  Orders: -     US  RENAL; Future -     Ambulatory referral to Nephrology  CAD in native artery Assessment & Plan: Sent ozempic   Orders: -     Ozempic  (0.25 or 0.5 MG/DOSE); Inject 0.25 mg into the skin once a week.     Meds ordered this encounter  Medications   Semaglutide ,0.25 or 0.5MG /DOS, (OZEMPIC , 0.25 OR 0.5 MG/DOSE,) 2 MG/3ML SOPN    Sig: Inject 0.25 mg into the skin once a week.   DISCONTD: Semaglutide -Weight Management (WEGOVY ) 0.5 MG/0.5ML SOAJ    Sig: Inject 0.5 mg into the skin once a week.    Dispense:  2 mL    Refill:  2    Orders Placed This Encounter  Procedures   US  Renal   Ambulatory referral to Nephrology     Follow-up: No follow-ups on file.   I,Marla I Leal-Borjas,acting as a scribe for Mercy Stall, MD.,have  documented all relevant documentation on the behalf of Mercy Stall, MD,as directed by  Mercy Stall, MD while in the presence of Mercy Stall, MD.   An After Visit Summary was printed and given to the patient.  Mercy Stall, MD [facility] [phone]

## 2023-12-11 ENCOUNTER — Encounter: Payer: Self-pay | Admitting: Family Medicine

## 2023-12-11 ENCOUNTER — Ambulatory Visit: Admitting: Family Medicine

## 2023-12-11 ENCOUNTER — Other Ambulatory Visit: Payer: Self-pay | Admitting: Family Medicine

## 2023-12-11 VITALS — BP 134/64 | HR 55 | Temp 98.2°F | Ht 70.0 in | Wt 233.0 lb

## 2023-12-11 DIAGNOSIS — I2510 Atherosclerotic heart disease of native coronary artery without angina pectoris: Secondary | ICD-10-CM

## 2023-12-11 DIAGNOSIS — N1832 Chronic kidney disease, stage 3b: Secondary | ICD-10-CM

## 2023-12-11 HISTORY — DX: Chronic kidney disease, stage 3b: N18.32

## 2023-12-11 MED ORDER — WEGOVY 0.5 MG/0.5ML ~~LOC~~ SOAJ: 0.5000 mg | SUBCUTANEOUS | 2 refills | Status: DC

## 2023-12-11 MED ORDER — OZEMPIC (0.25 OR 0.5 MG/DOSE) 2 MG/3ML ~~LOC~~ SOPN: 0.2500 mg | PEN_INJECTOR | SUBCUTANEOUS | Status: DC

## 2023-12-12 ENCOUNTER — Ambulatory Visit (HOSPITAL_BASED_OUTPATIENT_CLINIC_OR_DEPARTMENT_OTHER)
Admission: RE | Admit: 2023-12-12 | Discharge: 2023-12-12 | Disposition: A | Discharge status: Home / Self Care | Source: Ambulatory Visit | Attending: Family Medicine | Admitting: Family Medicine

## 2023-12-12 DIAGNOSIS — N200 Calculus of kidney: Secondary | ICD-10-CM | POA: Diagnosis not present

## 2023-12-12 DIAGNOSIS — N183 Chronic kidney disease, stage 3 unspecified: Secondary | ICD-10-CM | POA: Diagnosis not present

## 2023-12-12 DIAGNOSIS — N1832 Chronic kidney disease, stage 3b: Secondary | ICD-10-CM

## 2023-12-13 ENCOUNTER — Encounter: Payer: Self-pay | Admitting: Family Medicine

## 2023-12-14 NOTE — Addendum Note (Signed)
 Addended by: Janece Means on: 12/14/2023 02:47 PM   Modules accepted: Level of Service

## 2023-12-16 NOTE — Assessment & Plan Note (Signed)
 Order renal ultrasound Referring to nephrologist.

## 2023-12-16 NOTE — Assessment & Plan Note (Signed)
 Sent ozempic 

## 2023-12-28 ENCOUNTER — Ambulatory Visit: Admitting: Cardiology

## 2024-01-08 ENCOUNTER — Encounter (HOSPITAL_BASED_OUTPATIENT_CLINIC_OR_DEPARTMENT_OTHER): Payer: Self-pay | Admitting: Emergency Medicine

## 2024-01-08 ENCOUNTER — Ambulatory Visit (HOSPITAL_BASED_OUTPATIENT_CLINIC_OR_DEPARTMENT_OTHER)
Admission: EM | Admit: 2024-01-08 | Discharge: 2024-01-08 | Disposition: A | Discharge status: Home / Self Care | Attending: Family Medicine | Admitting: Family Medicine

## 2024-01-08 DIAGNOSIS — R509 Fever, unspecified: Secondary | ICD-10-CM | POA: Diagnosis not present

## 2024-01-08 DIAGNOSIS — J0110 Acute frontal sinusitis, unspecified: Secondary | ICD-10-CM

## 2024-01-08 DIAGNOSIS — R051 Acute cough: Secondary | ICD-10-CM

## 2024-01-08 DIAGNOSIS — J069 Acute upper respiratory infection, unspecified: Secondary | ICD-10-CM

## 2024-01-08 LAB — POC SARS CORONAVIRUS 2 AG -  ED: SARS Coronavirus 2 Ag: NEGATIVE

## 2024-01-08 MED ORDER — DOXYCYCLINE HYCLATE 100 MG PO CAPS: 100.0000 mg | ORAL_CAPSULE | Freq: Two times a day (BID) | ORAL | 0 refills | Status: AC

## 2024-01-08 MED ORDER — PROMETHAZINE-DM 6.25-15 MG/5ML PO SYRP: 5.0000 mL | ORAL_SOLUTION | Freq: Four times a day (QID) | ORAL | 0 refills | Status: DC | PRN

## 2024-01-08 NOTE — ED Triage Notes (Signed)
 Patient presets with c/o right ear pain, non productive cough, sore throat and nasal congestion x 3 days. Patient states he is taking tylenol  for the symptoms.

## 2024-01-08 NOTE — Discharge Instructions (Addendum)
 Early sinusitis and a viral upper respiratory infection with fever: Encouraged Promethazine  DM, 5 mL, every 6 hours as needed for cough.  Instructed on use of fluticasone  nasal spray for nasal congestion.  Doxycycline  100 mg twice daily for 7 days for the early sinusitis.  Follow-up if symptoms do not improve, worsen or new symptoms occur.

## 2024-01-08 NOTE — ED Provider Notes (Signed)
 [facility]    CSN: [id number] Arrival date & time: 01/08/24  4098      History   Chief Complaint Chief Complaint  Patient presents with   Nasal Congestion   Cough   Sore Throat   Otalgia    HPI John Kemp is a 78 y.o. male.   Patient is here with his wife.  Patient reports right ear pain, cough, sore throat, nasal congestion.  Symptoms since 01/05/2024.  Over-the-counter Tylenol  has helped his symptoms a little bit.  He has had some feeling hot and cold today and does have a low-grade fever.   Cough Associated symptoms: chills, ear pain, fever and rhinorrhea   Associated symptoms: no chest pain, no rash and no sore throat   Sore Throat Pertinent negatives include no chest pain and no abdominal pain.  Otalgia Associated symptoms: congestion, cough, fever and rhinorrhea   Associated symptoms: no abdominal pain, no diarrhea, no rash, no sore throat and no vomiting     Past Medical History:  Diagnosis Date   A-fib (HCC)    Acquired thrombophilia (HCC) 11/17/2019   Acute bronchitis due to other specified organisms 10/28/2021   Acute cough 10/20/2021   Acute non-recurrent maxillary sinusitis 10/28/2021   Anticoagulated 01/21/2015   Atherosclerotic heart disease of native coronary artery without angina pectoris    Atrial fibrillation with rapid ventricular response (HCC) 11/12/2021   Atrial flutter with rapid ventricular response (HCC) 11/12/2021   CAD in native artery 01/21/2015   Cancer (HCC)    skin cancer   Carotid artery occlusion    CHF (congestive heart failure) (HCC) 11/05/2019   Chronic diastolic (congestive) heart failure (HCC)    Chronic diastolic heart failure (HCC) 01/21/2015   Last Assessment & Plan:  Is congestive heart failure is mildly decompensated he is edematous he'll increase the dose of his diuretic today the lab work performed including an ARB referred to care transitions for home care he'll follow-up in my office with me in 4-6  weeks daily weights sodium restrictionand compliance of medications beinghemoglobin of will   Chronic obstructive pulmonary disease (COPD) (HCC)    Coronary artery disease involving autologous artery coronary bypass graft 11/12/2021   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 11/17/2019   Dizziness 11/12/2021   Dyslipidemia 11/12/2021   Elevated brain natriuretic peptide (BNP) level 11/12/2021   Essential hypertension 01/21/2015   right superior quadrantanopsia   Hearing loss of right ear due to cerumen impaction 10/20/2021   High cholesterol 10/20/2017   Hx of CABG 03/02/2017   Hypertension    Hypertensive heart disease with heart failure (HCC)    Hypothyroidism (acquired) 04/24/2018   Impaired fasting glucose    Left rotator cuff tear 11/12/2021   Mixed hyperlipidemia 11/17/2019   Myocardial infarction (HCC)    Non-seasonal allergic rhinitis due to pollen 10/20/2021   On amiodarone  therapy 01/21/2015   PAF (paroxysmal atrial fibrillation) (HCC)    Primary insomnia    Sequela of cardioembolic stroke 11/17/2019   Severe obesity with body mass index (BMI) of 36.0 to 36.9 with serious comorbidity (HCC)    Squamous cell carcinoma 11/05/2019   Typical atrial flutter (HCC) 01/21/2015   Vitamin D  deficiency, unspecified     Patient Active Problem List   Diagnosis Date Noted   CKD stage 3b, GFR 30-44 ml/min (HCC) 12/11/2023   CAD in native artery 12/11/2023   Seasonal allergies 11/13/2023   Chest tightness 11/13/2023   Class 1 obesity with serious  comorbidity and body mass index (BMI) of 33.0 to 33.9 in adult 11/05/2023   Hypertensive heart and kidney disease with chronic diastolic congestive heart failure and stage 3 chronic kidney disease, unspecified whether stage 3a or 3b CKD (HCC) 07/24/2023   Stage 3a chronic kidney disease (HCC) 07/24/2023   Anticoagulant long-term use 10/11/2022   Nasal septal deviation 10/11/2022   Nasal turbinate hypertrophy 10/11/2022    Encounter for immunization 08/23/2022   Pedal edema 07/05/2022   B12 deficiency 06/03/2022   Sensorineural hearing loss (SNHL) of right ear with unrestricted hearing of left ear 05/26/2022   Subjective tinnitus of right ear 05/26/2022   Neural foraminal stenosis of cervical spine 02/28/2022   Bilateral carotid artery stenosis 02/20/2022   Neck pain 02/20/2022   Benign prostatic hyperplasia with urinary frequency 12/24/2021   Physical deconditioning 12/07/2021   Persistent atrial fibrillation (HCC) 11/12/2021   Coronary artery disease involving autologous artery coronary bypass graft 11/12/2021   Dysequilibrium 11/12/2021   Left rotator cuff tear 11/12/2021   Non-seasonal allergic rhinitis due to pollen 10/20/2021   Hearing loss of right ear 10/20/2021   Vitamin D  deficiency, unspecified    Primary insomnia    Impaired fasting glucose    Hypertensive heart disease with heart failure (HCC)    Acquired thrombophilia (HCC) 11/17/2019   Sequela of cardioembolic stroke 11/17/2019   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 11/17/2019   Mixed hyperlipidemia 11/17/2019   Squamous cell carcinoma 11/05/2019   Acquired hypothyroidism 04/24/2018   Class 2 severe obesity due to excess calories with serious comorbidity and body mass index (BMI) of 35.0 to 35.9 in adult [facility])    Hx of CABG 03/02/2017   On amiodarone  therapy 01/21/2015   Atherosclerotic heart disease of native coronary artery without angina pectoris 01/21/2015    Past Surgical History:  Procedure Laterality Date   APPENDECTOMY  1956   CARDIAC CATHETERIZATION     CARDIOVERSION  2002   CHOLECYSTECTOMY  2006   CORONARY ARTERY BYPASS GRAFT  2001   HERNIA REPAIR     RADIOLOGY WITH ANESTHESIA Bilateral 01/18/2018   Procedure: MRI WITH ANESTHESIA LUMBAR SPINE WITH AND WITHOUT CONTRAST;  Surgeon: Radiologist, Medication, MD;  Location: [facility] OR;  Service: Radiology;  Laterality: Bilateral;   RADIOLOGY WITH ANESTHESIA N/A  04/19/2022   Procedure: MRI WITH BRAIN WITH AND WITHOUT CONTRAST;  Surgeon: Radiologist, Medication, MD;  Location: [facility] OR;  Service: Radiology;  Laterality: N/A;   ROTATOR CUFF REPAIR Left    TONSILLECTOMY  1975       Home Medications    Prior to Admission medications   Medication Sig Start Date End Date Taking? Authorizing Provider  doxycycline  (VIBRAMYCIN ) 100 MG capsule Take 1 capsule (100 mg total) by mouth 2 (two) times daily for 7 days. 01/08/24 01/15/24 Yes Guss Legacy, FNP  promethazine -dextromethorphan  (PROMETHAZINE -DM) 6.25-15 MG/5ML syrup Take 5 mLs by mouth 4 (four) times daily as needed for cough. Do not use and drive - May make drowsy. 01/08/24  Yes Guss Legacy, FNP  acetaminophen  (TYLENOL ) 500 MG tablet Take 500 mg by mouth 2 (two) times daily.    [provider]  amiodarone  (PACERONE ) 200 MG tablet Take 1 tablet (200 mg total) by mouth daily. 09/27/23   Hassan Links, MD  amLODipine  (NORVASC ) 5 MG tablet TAKE 1 TABLET (5 MG TOTAL) BY MOUTH DAILY. 08/21/23   Hassan Links, MD  atorvastatin  (LIPITOR) 20 MG tablet TAKE 1 TABLET BY MOUTH EVERY DAY 11/06/23  Cox, Kirsten, MD  azelastine  (ASTELIN ) 0.1 % nasal spray Place 1 spray into both nostrils 2 (two) times daily. Use in each nostril as directed 12/04/23   Janece Means, FNP  cyanocobalamin  (VITAMIN B12) 1000 MCG/ML injection INJECT 1 ML (1,000 MCG TOTAL) INTO THE MUSCLE EVERY 30 DAYS. 10/29/23   Cox, Burleigh Carp, MD  ELIQUIS  5 MG TABS tablet TAKE 1 TABLET BY MOUTH TWICE A DAY 11/09/23   Cox, Kirsten, MD  finasteride  (PROSCAR ) 5 MG tablet Take 1 tablet (5 mg total) by mouth daily. 12/31/21   CoxBurleigh Carp, MD  fluticasone  (FLONASE ) 50 MCG/ACT nasal spray Place 2 sprays into both nostrils daily. Use in addition to azelastine . 11/13/23   Jacobs, Dina M, FNP  Inulin (FIBER CHOICE PO) Take 3 tablets by mouth daily as needed (Constipation).    [provider]  ketoconazole (NIZORAL) 2 % shampoo Apply 1 Application topically  3 (three) times a week. 07/20/22   [provider]  levothyroxine  (SYNTHROID ) 112 MCG tablet TAKE 1 TABLET BY MOUTH EVERY DAY 08/29/23   Cox, Burleigh Carp, MD  montelukast  (SINGULAIR ) 10 MG tablet Take 1 tablet (10 mg total) by mouth at bedtime. 11/13/23   Janece Means, FNP  nitroGLYCERIN  (NITROSTAT ) 0.4 MG SL tablet Place 1 tablet (0.4 mg total) under the tongue every 5 (five) minutes as needed for chest pain. 07/24/23   Cox, Burleigh Carp, MD  nystatin cream (MYCOSTATIN) Apply 1 Application topically 2 (two) times daily. 07/07/22   [provider]  Semaglutide ,0.25 or 0.5MG /DOS, (OZEMPIC , 0.25 OR 0.5 MG/DOSE,) 2 MG/3ML SOPN Inject 0.25 mg into the skin once a week. 12/11/23   CoxBurleigh Carp, MD  Semaglutide -Weight Management (WEGOVY ) 0.5 MG/0.5ML SOAJ Inject 0.5 mg into the skin once a week. 12/11/23   Cox, Burleigh Carp, MD  SYRINGE-NEEDLE, DISP, 3 ML (BD ECLIPSE SYRINGE/NEEDLE) 25G X 5/8" 3 ML MISC Inject 1 ml of B12 every week for 4 weeks 07/31/23   Cox, Burleigh Carp, MD  tamsulosin  (FLOMAX ) 0.4 MG CAPS capsule TAKE 1 CAPSULE BY MOUTH EVERY DAY 08/18/23   Cox, Kirsten, MD  torsemide  (DEMADEX ) 20 MG tablet TAKE 1 TABLET BY MOUTH EVERY DAY 10/15/23   Cox, Burleigh Carp, MD  triamcinolone  cream (KENALOG ) 0.1 % Apply 1 Application topically 2 (two) times daily. 07/07/22   [provider]  valsartan  (DIOVAN ) 320 MG tablet TAKE 1 TABLET BY MOUTH EVERY DAY 08/19/23   Cox, Kirsten, MD  Vitamin D , Ergocalciferol , (DRISDOL ) 1.25 MG (50000 UNIT) CAPS capsule TAKE 1 CAPSULE (50,000 UNITS TOTAL) BY MOUTH EVERY 7 (SEVEN) DAYS 11/24/23   Cox, Burleigh Carp, MD  zolpidem  (AMBIEN ) 10 MG tablet Take 1 tablet (10 mg total) by mouth at bedtime. 07/24/23   CoxBurleigh Carp, MD    Family History Family History  Problem Relation Age of Onset   Heart disease Father    Arrhythmia Father    Breast cancer Mother    Diabetes type II Sister    Diabetes Brother     Social History Social History   Tobacco Use   Smoking status:  Former    Current packs/day: 0.00    Average packs/day: 1 pack/day for 15.0 years (15.0 ttl pk-yrs)    Types: Cigarettes    Start date: 08/30/1963    Quit date: 08/29/1978    Years since quitting: 45.3    Passive exposure: Past   Smokeless tobacco: Never  Vaping Use   Vaping status: Never Used  Substance Use Topics   Alcohol use: No  Alcohol/week: 0.0 standard drinks of alcohol   Drug use: No     Allergies   Pravastatin , Prednisone , Diltiazem hcl, Ezetimibe, Kerendia  [finerenone ], Moxifloxacin hcl, and Pravachol  [pravastatin  sodium]   Review of Systems Review of Systems  Constitutional:  Positive for chills and fever.  HENT:  Positive for congestion, ear pain, postnasal drip, rhinorrhea and sinus pressure. Negative for sore throat.   Eyes:  Negative for pain and visual disturbance.  Respiratory:  Positive for cough.   Cardiovascular:  Negative for chest pain and palpitations.  Gastrointestinal:  Negative for abdominal pain, constipation, diarrhea, nausea and vomiting.  Genitourinary:  Negative for dysuria and hematuria.  Musculoskeletal:  Negative for arthralgias and back pain.  Skin:  Negative for color change and rash.  Neurological:  Negative for seizures and syncope.  All other systems reviewed and are negative.    Physical Exam Triage Vital Signs ED Triage Vitals  Encounter Vitals Group     BP 01/08/24 0846 122/70     Systolic BP Percentile --      Diastolic BP Percentile --      Pulse Rate 01/08/24 0846 (!) 56     Resp 01/08/24 0846 18     Temp 01/08/24 0846 99.6 F (37.6 C)     Temp Source 01/08/24 0846 Oral     SpO2 01/08/24 0846 95 %     Weight --      Height --      Head Circumference --      Peak Flow --      Pain Score 01/08/24 0844 0     Pain Loc --      Pain Education --      Exclude from Growth Chart --    No data found.  Updated Vital Signs BP 122/70 (BP Location: Right Arm)   Pulse (!) 56   Temp 99.6 F (37.6 C) (Oral)   Resp 18    SpO2 95%   Visual Acuity Right Eye Distance:   Left Eye Distance:   Bilateral Distance:    Right Eye Near:   Left Eye Near:    Bilateral Near:     Physical Exam Vitals and nursing note reviewed.  Constitutional:      General: He is not in acute distress.    Appearance: He is well-developed. He is not ill-appearing or toxic-appearing.  HENT:     Head: Normocephalic and atraumatic.     Right Ear: Hearing, tympanic membrane, ear canal and external ear normal.     Left Ear: Hearing, tympanic membrane, ear canal and external ear normal.     Nose: Congestion and rhinorrhea present. Rhinorrhea is clear.     Right Sinus: Frontal sinus tenderness (mild) present. No maxillary sinus tenderness.     Left Sinus: Frontal sinus tenderness (mild) present. No maxillary sinus tenderness.     Mouth/Throat:     Lips: Pink.     Mouth: Mucous membranes are moist.     Pharynx: Uvula midline. No oropharyngeal exudate or posterior oropharyngeal erythema.     Tonsils: No tonsillar exudate.  Eyes:     Conjunctiva/sclera: Conjunctivae normal.     Pupils: Pupils are equal, round, and reactive to light.  Cardiovascular:     Rate and Rhythm: Normal rate and regular rhythm.     Heart sounds: S1 normal and S2 normal. No murmur heard. Pulmonary:     Effort: Pulmonary effort is normal. No respiratory distress.     Breath sounds: Normal breath  sounds. No decreased breath sounds, wheezing, rhonchi or rales.  Abdominal:     General: Bowel sounds are normal.     Palpations: Abdomen is soft.     Tenderness: There is no abdominal tenderness.  Musculoskeletal:        General: No swelling.     Cervical back: Neck supple.  Lymphadenopathy:     Head:     Right side of head: No submental, submandibular, tonsillar, preauricular or posterior auricular adenopathy.     Left side of head: No submental, submandibular, tonsillar, preauricular or posterior auricular adenopathy.     Cervical: No cervical adenopathy.      Right cervical: No superficial cervical adenopathy.    Left cervical: No superficial cervical adenopathy.  Skin:    General: Skin is warm and dry.     Capillary Refill: Capillary refill takes less than 2 seconds.     Findings: No rash.  Neurological:     Mental Status: He is alert and oriented to person, place, and time.  Psychiatric:        Mood and Affect: Mood normal.      UC Treatments / Results  Labs (all labs ordered are listed, but only abnormal results are displayed) Labs Reviewed  POC SARS CORONAVIRUS 2 AG -  ED    EKG   Radiology No results found.  Procedures Procedures (including critical care time)  Medications Ordered in UC Medications - No data to display  Initial Impression / Assessment and Plan / UC Course  I have reviewed the triage vital signs and the nursing notes.  Pertinent labs & imaging results that were available during my care of the patient were reviewed by me and considered in my medical decision making (see chart for details).     Plan of Care: Early sinusitis with viral upper respiratory infection and fever: Doxycycline  100 mg twice daily for 7 days.  Promethazine  DM, 5 mL, every 6 hours as needed for cough.  Instructed on proper use of fluticasone  nasal spray and encouraged to use it once or twice daily as needed for nasal congestion.  Rapid COVID test was negative.    Follow-up if symptoms do not improve, worsen or new symptoms occur. Final Clinical Impressions(s) / UC Diagnoses   Final diagnoses:  Acute cough  Fever, unspecified  Viral upper respiratory infection  Acute non-recurrent frontal sinusitis     Discharge Instructions      Early sinusitis and a viral upper respiratory infection with fever: Encouraged Promethazine  DM, 5 mL, every 6 hours as needed for cough.  Instructed on use of fluticasone  nasal spray for nasal congestion.  Doxycycline  100 mg twice daily for 7 days for the early sinusitis.  Follow-up if symptoms do  not improve, worsen or new symptoms occur.    ED Prescriptions     Medication Sig Dispense Auth. Provider   promethazine -dextromethorphan  (PROMETHAZINE -DM) 6.25-15 MG/5ML syrup Take 5 mLs by mouth 4 (four) times daily as needed for cough. Do not use and drive - May make drowsy. 118 mL Guss Legacy, FNP   doxycycline  (VIBRAMYCIN ) 100 MG capsule Take 1 capsule (100 mg total) by mouth 2 (two) times daily for 7 days. 14 capsule Guss Legacy, FNP      PDMP not reviewed this encounter.   Guss Legacy, FNP 01/09/24 [phone]

## 2024-01-12 ENCOUNTER — Other Ambulatory Visit: Payer: Self-pay | Admitting: Family Medicine

## 2024-01-20 ENCOUNTER — Other Ambulatory Visit: Payer: Self-pay | Admitting: Family Medicine

## 2024-01-20 DIAGNOSIS — E538 Deficiency of other specified B group vitamins: Secondary | ICD-10-CM

## 2024-01-27 ENCOUNTER — Other Ambulatory Visit: Payer: Self-pay | Admitting: Family Medicine

## 2024-01-27 DIAGNOSIS — F5101 Primary insomnia: Secondary | ICD-10-CM

## 2024-01-30 DIAGNOSIS — L570 Actinic keratosis: Secondary | ICD-10-CM | POA: Diagnosis not present

## 2024-01-30 DIAGNOSIS — L578 Other skin changes due to chronic exposure to nonionizing radiation: Secondary | ICD-10-CM | POA: Diagnosis not present

## 2024-01-30 DIAGNOSIS — L820 Inflamed seborrheic keratosis: Secondary | ICD-10-CM | POA: Diagnosis not present

## 2024-01-30 DIAGNOSIS — L821 Other seborrheic keratosis: Secondary | ICD-10-CM | POA: Diagnosis not present

## 2024-02-01 ENCOUNTER — Encounter: Payer: Self-pay | Admitting: Cardiology

## 2024-02-04 NOTE — Progress Notes (Unsigned)
 Subjective:  Patient ID: John Kemp, male    DOB: 03/17/1946  Age: 78 y.o. MRN: [id number]  No chief complaint on file.   HPI:  Afib: Taking Eliquis  5 mg twice a day, Amiodarone  200 mg daily.  Hypertension: Takes Amlodipine  5 mg daily, Valsartan  320 mg daily.  Hypothyroidism: Currently on Levothyroxine  112 mcg daily.  BPH: Tamsulosin  0.4 mg daily, Finasteride  5 mg daily. No trouble urinating.   Hyperlipidemia: Atorvastatin  20 mg every day.  CONGESTIVE HEART FAILURE/CAD: Currently on torsemide  20 mg every day and has ntg.   Vitamin D  deficiency: recommend vitamin D   50K weekly.   Insomnia: ambien  10 mg before bed. Must take every night to sleep.      11/02/2023    8:48 AM 09/11/2023    2:02 PM 07/24/2023    8:33 AM 07/24/2023    8:03 AM 01/11/2023    8:28 AM  Depression screen PHQ 2/9  Decreased Interest 0 0 0 0 0  Down, Depressed, Hopeless 0 0 0 0 0  PHQ - 2 Score 0 0 0 0 0  Altered sleeping     0  Tired, decreased energy     0  Change in appetite     0  Feeling bad or failure about yourself      0  Trouble concentrating     0  Moving slowly or fidgety/restless     0  Suicidal thoughts     0  PHQ-9 Score     0  Difficult doing work/chores     Not difficult at all        11/02/2023    8:48 AM  Fall Risk   Falls in the past year? 0  Number falls in past yr: 0  Injury with Fall? 0  Risk for fall due to : History of fall(s)  Follow up Falls evaluation completed;Falls prevention discussed    Patient Care Team: Mercy Stall, MD as PCP - General (Family Medicine) Devon Fogo, [facility] (Inactive) as Pharmacist (Pharmacist) Maria Shiner, AUD (Audiology) Hassan Links, MD as Consulting Physician (Cardiology)   Review of Systems  Current Outpatient Medications on File Prior to Visit  Medication Sig Dispense Refill   acetaminophen  (TYLENOL ) 500 MG tablet Take 500 mg by mouth 2 (two) times daily.     amiodarone  (PACERONE ) 200 MG tablet Take 1 tablet (200  mg total) by mouth daily. 90 tablet 2   amLODipine  (NORVASC ) 5 MG tablet TAKE 1 TABLET (5 MG TOTAL) BY MOUTH DAILY. 90 tablet 2   atorvastatin  (LIPITOR) 20 MG tablet TAKE 1 TABLET BY MOUTH EVERY DAY 90 tablet 1   azelastine  (ASTELIN ) 0.1 % nasal spray Place 1 spray into both nostrils 2 (two) times daily. Use in each nostril as directed 30 mL 12   cyanocobalamin  (VITAMIN B12) 1000 MCG/ML injection INJECT 1 ML (1,000 MCG) INTRAMUSCULARLY EVERY 30 DAYS 3 mL 1   ELIQUIS  5 MG TABS tablet TAKE 1 TABLET BY MOUTH TWICE A DAY 60 tablet 5   finasteride  (PROSCAR ) 5 MG tablet Take 1 tablet (5 mg total) by mouth daily. 90 tablet 1   fluticasone  (FLONASE ) 50 MCG/ACT nasal spray Place 2 sprays into both nostrils daily. Use in addition to azelastine . 16 g 6   Inulin (FIBER CHOICE PO) Take 3 tablets by mouth daily as needed (Constipation).     ketoconazole (NIZORAL) 2 % shampoo Apply 1 Application topically 3 (three) times a week.     levothyroxine  (  SYNTHROID ) 112 MCG tablet TAKE 1 TABLET BY MOUTH EVERY DAY 90 tablet 3   montelukast  (SINGULAIR ) 10 MG tablet Take 1 tablet (10 mg total) by mouth at bedtime. 30 tablet 3   nitroGLYCERIN  (NITROSTAT ) 0.4 MG SL tablet Place 1 tablet (0.4 mg total) under the tongue every 5 (five) minutes as needed for chest pain. 25 tablet 2   nystatin cream (MYCOSTATIN) Apply 1 Application topically 2 (two) times daily.     promethazine -dextromethorphan  (PROMETHAZINE -DM) 6.25-15 MG/5ML syrup Take 5 mLs by mouth 4 (four) times daily as needed for cough. Do not use and drive - May make drowsy. 118 mL 0   Semaglutide ,0.25 or 0.5MG /DOS, (OZEMPIC , 0.25 OR 0.5 MG/DOSE,) 2 MG/3ML SOPN Inject 0.25 mg into the skin once a week.     Semaglutide -Weight Management (WEGOVY ) 0.5 MG/0.5ML SOAJ Inject 0.5 mg into the skin once a week. 2 mL 0   SYRINGE-NEEDLE, DISP, 3 ML (BD ECLIPSE SYRINGE/NEEDLE) 25G X 5/8" 3 ML MISC Inject 1 ml of B12 every week for 4 weeks 50 each 0   tamsulosin  (FLOMAX ) 0.4 MG CAPS  capsule TAKE 1 CAPSULE BY MOUTH EVERY DAY 90 capsule 1   torsemide  (DEMADEX ) 20 MG tablet TAKE 1 TABLET BY MOUTH EVERY DAY 90 tablet 0   triamcinolone  cream (KENALOG ) 0.1 % Apply 1 Application topically 2 (two) times daily.     valsartan  (DIOVAN ) 320 MG tablet TAKE 1 TABLET BY MOUTH EVERY DAY 90 tablet 1   Vitamin D , Ergocalciferol , (DRISDOL ) 1.25 MG (50000 UNIT) CAPS capsule TAKE 1 CAPSULE (50,000 UNITS TOTAL) BY MOUTH EVERY 7 (SEVEN) DAYS 12 capsule 2   zolpidem  (AMBIEN ) 10 MG tablet TAKE ONE TABLET BY MOUTH AT BEDTIME 90 tablet 1   No current facility-administered medications on file prior to visit.   Past Medical History:  Diagnosis Date   A-fib (HCC)    Acquired thrombophilia (HCC) 11/17/2019   Acute bronchitis due to other specified organisms 10/28/2021   Acute cough 10/20/2021   Acute non-recurrent maxillary sinusitis 10/28/2021   Anticoagulated 01/21/2015   Atherosclerotic heart disease of native coronary artery without angina pectoris    Atrial fibrillation with rapid ventricular response (HCC) 11/12/2021   Atrial flutter with rapid ventricular response (HCC) 11/12/2021   CAD in native artery 01/21/2015   Cancer [facility])    skin cancer   Carotid artery occlusion    CHF (congestive heart failure) (HCC) 11/05/2019   Chronic diastolic (congestive) heart failure (HCC)    Chronic diastolic heart failure (HCC) 01/21/2015   Last Assessment & Plan:  Is congestive heart failure is mildly decompensated he is edematous he'll increase the dose of his diuretic today the lab work performed including an ARB referred to care transitions for home care he'll follow-up in my office with me in 4-6 weeks daily weights sodium restrictionand compliance of medications beinghemoglobin of will   Chronic obstructive pulmonary disease (COPD) (HCC)    Coronary artery disease involving autologous artery coronary bypass graft 11/12/2021   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 11/17/2019    Dizziness 11/12/2021   Dyslipidemia 11/12/2021   Elevated brain natriuretic peptide (BNP) level 11/12/2021   Essential hypertension 01/21/2015   right superior quadrantanopsia   Hearing loss of right ear due to cerumen impaction 10/20/2021   High cholesterol 10/20/2017   Hx of CABG 03/02/2017   Hypertension    Hypertensive heart disease with heart failure (HCC)    Hypothyroidism (acquired) 04/24/2018   Impaired fasting glucose  Left rotator cuff tear 11/12/2021   Mixed hyperlipidemia 11/17/2019   Myocardial infarction [facility])    Non-seasonal allergic rhinitis due to pollen 10/20/2021   On amiodarone  therapy 01/21/2015   PAF (paroxysmal atrial fibrillation) (HCC)    Primary insomnia    Sequela of cardioembolic stroke 11/17/2019   Severe obesity with body mass index (BMI) of 36.0 to 36.9 with serious comorbidity (HCC)    Squamous cell carcinoma 11/05/2019   Typical atrial flutter (HCC) 01/21/2015   Vitamin D  deficiency, unspecified    Past Surgical History:  Procedure Laterality Date   APPENDECTOMY  1956   CARDIAC CATHETERIZATION     CARDIOVERSION  2002   CHOLECYSTECTOMY  2006   CORONARY ARTERY BYPASS GRAFT  2001   HERNIA REPAIR     RADIOLOGY WITH ANESTHESIA Bilateral 01/18/2018   Procedure: MRI WITH ANESTHESIA LUMBAR SPINE WITH AND WITHOUT CONTRAST;  Surgeon: Radiologist, Medication, MD;  Location: [facility] OR;  Service: Radiology;  Laterality: Bilateral;   RADIOLOGY WITH ANESTHESIA N/A 04/19/2022   Procedure: MRI WITH BRAIN WITH AND WITHOUT CONTRAST;  Surgeon: Radiologist, Medication, MD;  Location: [facility] OR;  Service: Radiology;  Laterality: N/A;   ROTATOR CUFF REPAIR Left    TONSILLECTOMY  1975    Family History  Problem Relation Age of Onset   Heart disease Father    Arrhythmia Father    Breast cancer Mother    Diabetes type II Sister    Diabetes Brother    Social History   Socioeconomic History   Marital status: Married    Spouse name: Amalia Badder   Number of children: 2    Years of education: Not on file   Highest education level: Not on file  Occupational History   Occupation: Retired  Tobacco Use   Smoking status: Former    Current packs/day: 0.00    Average packs/day: 1 pack/day for 15.0 years (15.0 ttl pk-yrs)    Types: Cigarettes    Start date: 08/30/1963    Quit date: 08/29/1978    Years since quitting: 45.4    Passive exposure: Past   Smokeless tobacco: Never  Vaping Use   Vaping status: Never Used  Substance and Sexual Activity   Alcohol use: No    Alcohol/week: 0.0 standard drinks of alcohol   Drug use: No   Sexual activity: Not Currently  Other Topics Concern   Not on file  Social History Narrative   Not on file   Social Drivers of Health   Financial Resource Strain: Low Risk  (09/11/2023)   Overall Financial Resource Strain (CARDIA)    Difficulty of Paying Living Expenses: Not very hard  Food Insecurity: No Food Insecurity (09/11/2023)   Hunger Vital Sign    Worried About Running Out of Food in the Last Year: Never true    Ran Out of Food in the Last Year: Never true  Transportation Needs: No Transportation Needs (09/11/2023)   PRAPARE - Administrator, Civil Service (Medical): No    Lack of Transportation (Non-Medical): No  Physical Activity: Sufficiently Active (09/11/2023)   Exercise Vital Sign    Days of Exercise per Week: 4 days    Minutes of Exercise per Session: 50 min  Stress: No Stress Concern Present (09/11/2023)   [employer] of Occupational Health - Occupational Stress Questionnaire    Feeling of Stress : Not at all  Social Connections: Moderately Integrated (11/02/2023)   Social Connection and Isolation Panel [NHANES]    Frequency of Communication with  Friends and Family: Three times a week    Frequency of Social Gatherings with Friends and Family: Three times a week    Attends Religious Services: More than 4 times per year    Active Member of Clubs or Organizations: No    Attends Banker  Meetings: Never    Marital Status: Married    Objective:  There were no vitals taken for this visit.     01/08/2024    8:46 AM 12/11/2023    9:33 AM 11/13/2023    8:12 AM  BP/Weight  Systolic BP 122 134 126  Diastolic BP 70 64 68  Wt. (Lbs)  233 236.6  BMI  33.43 kg/m2 33.95 kg/m2    Physical Exam  Diabetic Foot Exam - Simple   No data filed      Lab Results  Component Value Date   WBC 5.0 11/13/2023   HGB 13.3 11/13/2023   HCT 39.6 11/13/2023   PLT 201 11/13/2023   GLUCOSE 88 12/05/2023   CHOL 136 11/02/2023   TRIG 101 11/02/2023   HDL 45 11/02/2023   LDLCALC 72 11/02/2023   ALT 18 12/05/2023   AST 19 12/05/2023   NA 140 12/05/2023   K 4.3 12/05/2023   CL 100 12/05/2023   CREATININE 1.57 (H) 12/05/2023   BUN 18 12/05/2023   CO2 24 12/05/2023   TSH 3.750 11/02/2023   INR 1.1 02/02/2022   HGBA1C 5.4 12/05/2023   MICROALBUR Positive 150 01/23/2019      Assessment & Plan:  Hypertensive heart and kidney disease with chronic diastolic congestive heart failure and stage 3 chronic kidney disease, unspecified whether stage 3a or 3b CKD (HCC)  Impaired fasting glucose  Acquired hypothyroidism  Mixed hyperlipidemia     No orders of the defined types were placed in this encounter.   No orders of the defined types were placed in this encounter.    Follow-up: No follow-ups on file.   I,Marla I Leal-Borjas,acting as a scribe for Mercy Stall, MD.,have documented all relevant documentation on the behalf of Mercy Stall, MD,as directed by  Mercy Stall, MD while in the presence of Mercy Stall, MD.   An After Visit Summary was printed and given to the patient.  Mercy Stall, MD [facility] [phone]

## 2024-02-05 ENCOUNTER — Encounter: Payer: Self-pay | Admitting: Family Medicine

## 2024-02-05 ENCOUNTER — Ambulatory Visit (INDEPENDENT_AMBULATORY_CARE_PROVIDER_SITE_OTHER): Admitting: Family Medicine

## 2024-02-05 VITALS — BP 102/50 | HR 55 | Temp 97.3°F | Resp 16 | Ht 70.0 in | Wt 229.0 lb

## 2024-02-05 DIAGNOSIS — E039 Hypothyroidism, unspecified: Secondary | ICD-10-CM | POA: Diagnosis not present

## 2024-02-05 DIAGNOSIS — R7301 Impaired fasting glucose: Secondary | ICD-10-CM | POA: Diagnosis not present

## 2024-02-05 DIAGNOSIS — I5032 Chronic diastolic (congestive) heart failure: Secondary | ICD-10-CM

## 2024-02-05 DIAGNOSIS — I130 Hypertensive heart and chronic kidney disease with heart failure and stage 1 through stage 4 chronic kidney disease, or unspecified chronic kidney disease: Secondary | ICD-10-CM | POA: Diagnosis not present

## 2024-02-05 DIAGNOSIS — N183 Chronic kidney disease, stage 3 unspecified: Secondary | ICD-10-CM | POA: Diagnosis not present

## 2024-02-05 DIAGNOSIS — J018 Other acute sinusitis: Secondary | ICD-10-CM | POA: Diagnosis not present

## 2024-02-05 DIAGNOSIS — E66811 Obesity, class 1: Secondary | ICD-10-CM

## 2024-02-05 DIAGNOSIS — E782 Mixed hyperlipidemia: Secondary | ICD-10-CM

## 2024-02-05 DIAGNOSIS — E6609 Other obesity due to excess calories: Secondary | ICD-10-CM | POA: Diagnosis not present

## 2024-02-05 DIAGNOSIS — Z6832 Body mass index (BMI) 32.0-32.9, adult: Secondary | ICD-10-CM | POA: Diagnosis not present

## 2024-02-05 DIAGNOSIS — I480 Paroxysmal atrial fibrillation: Secondary | ICD-10-CM

## 2024-02-05 MED ORDER — AMOXICILLIN 875 MG PO TABS: 875.0000 mg | ORAL_TABLET | Freq: Two times a day (BID) | ORAL | 0 refills | Status: AC

## 2024-02-05 MED ORDER — ATORVASTATIN CALCIUM 40 MG PO TABS: 40.0000 mg | ORAL_TABLET | Freq: Every day | ORAL | 0 refills | Status: DC

## 2024-02-05 NOTE — Assessment & Plan Note (Signed)
 Well controlled.  No changes to medicines. Continue Amlodipine  5 mg daily, Valsartan  320 mg daily, and torsemide  20 mg daily.  Continue to work on eating a healthy diet and exercise.

## 2024-02-05 NOTE — Assessment & Plan Note (Signed)
 Fairly well controlled but goal for his LDL is less than 55. Increase Atorvastatin  to 40 mg every day  Continue to work on eating a healthy diet and exercise.

## 2024-02-05 NOTE — Assessment & Plan Note (Signed)
Start on amoxicillin. 

## 2024-02-05 NOTE — Assessment & Plan Note (Signed)
 Recommend continue to work on eating healthy diet and exercise.

## 2024-02-05 NOTE — Assessment & Plan Note (Signed)
Well controlled Continue Synthroid at current dose

## 2024-02-05 NOTE — Assessment & Plan Note (Signed)
 Recommend continue to work on eating healthy diet and exercise. Intolerant to semaglutide 

## 2024-02-07 DIAGNOSIS — I480 Paroxysmal atrial fibrillation: Secondary | ICD-10-CM | POA: Insufficient documentation

## 2024-02-07 NOTE — Addendum Note (Signed)
 Addended byMercy Stall on: 02/07/2024 11:22 PM   Modules accepted: Level of Service

## 2024-02-07 NOTE — Assessment & Plan Note (Signed)
 The current medical regimen is effective;  continue present plan and medications. Continue eliquis  and amiodarone .

## 2024-02-08 ENCOUNTER — Other Ambulatory Visit: Payer: Self-pay | Admitting: Family Medicine

## 2024-02-08 DIAGNOSIS — J302 Other seasonal allergic rhinitis: Secondary | ICD-10-CM

## 2024-02-11 ENCOUNTER — Other Ambulatory Visit: Payer: Self-pay | Admitting: Family Medicine

## 2024-02-11 DIAGNOSIS — N401 Enlarged prostate with lower urinary tract symptoms: Secondary | ICD-10-CM

## 2024-02-27 ENCOUNTER — Ambulatory Visit: Admitting: Cardiology

## 2024-03-12 DIAGNOSIS — J449 Chronic obstructive pulmonary disease, unspecified: Secondary | ICD-10-CM | POA: Diagnosis not present

## 2024-03-12 DIAGNOSIS — N1832 Chronic kidney disease, stage 3b: Secondary | ICD-10-CM | POA: Diagnosis not present

## 2024-03-12 DIAGNOSIS — E785 Hyperlipidemia, unspecified: Secondary | ICD-10-CM | POA: Diagnosis not present

## 2024-03-12 DIAGNOSIS — I509 Heart failure, unspecified: Secondary | ICD-10-CM | POA: Diagnosis not present

## 2024-03-12 DIAGNOSIS — I129 Hypertensive chronic kidney disease with stage 1 through stage 4 chronic kidney disease, or unspecified chronic kidney disease: Secondary | ICD-10-CM | POA: Diagnosis not present

## 2024-03-14 LAB — LAB REPORT - SCANNED
Albumin, Urine POC: 16.2
Albumin/Creatinine Ratio, Urine, POC: 5
Creatinine, POC: 339.1 mg/dL
EGFR: 44

## 2024-03-31 ENCOUNTER — Ambulatory Visit: Payer: Self-pay | Admitting: Family Medicine

## 2024-04-12 ENCOUNTER — Other Ambulatory Visit: Payer: Self-pay | Admitting: Family Medicine

## 2024-04-16 ENCOUNTER — Other Ambulatory Visit: Payer: Self-pay | Admitting: Family Medicine

## 2024-04-19 NOTE — Progress Notes (Signed)
 Cardiology Office Note:    Date:  04/24/2024   ID:  [name], John Kemp, MRN [id number]  PCP:  Sherre Clapper, MD  Cardiologist:  Redell Leiter, MD     ASSESSMENT:    1. PAF (paroxysmal atrial fibrillation) (HCC)   2. On amiodarone  therapy   3. Acquired hypothyroidism   4. Chronic anticoagulation   5. LBBB (left bundle branch block)   6. Sinus bradycardia   7. Hypertensive heart disease with chronic diastolic congestive heart failure (HCC)   8. CKD stage 3b, GFR 30-44 ml/min (HCC)   9. CAD in native artery   10. Pure hypercholesterolemia    PLAN:    In order of problems listed above:  Overall Hildreth is doing well with his complex cardiac disease Maintain in sinus rhythm on low-dose amiodarone  will continue that along with his current anticoagulant I told his wife if they can heart rates of less than 45 to contact me. Stable thyroid  disease managed by his PCP as TSH in March 3.75 normal Stable pattern left bundle branch block stable sinus bradycardia He has been mildly fluid overloaded with a blood pressure 100 systolic we will reduce his calcium  channel blocker and may discontinue next visit and take an extra dose of his torsemide  Monday Wednesday and Friday midday Stable CKD recent labs Stable CAD having no angina continue his medical therapy including anticoagulant statin calcium  channel blocker   Next appointment: 70-month follow-up   Medication Adjustments/Labs and Tests Ordered: Current medicines are reviewed at length with the patient today.  Concerns regarding medicines are outlined above.  Orders Placed This Encounter  Procedures   EKG 12-Lead   Meds ordered this encounter  Medications   torsemide  (DEMADEX ) 20 MG tablet    Sig: Take 1 tablet (20 mg total) by mouth daily. Take an additional Torsemide  at 12 noon on M - W - F.    Dispense:  180 tablet    Refill:  3   amLODipine  (NORVASC ) 5 MG tablet    Sig: Take 1 tablet (5 mg total) by mouth every  Monday, Wednesday, and Friday.    Dispense:  45 tablet    Refill:  3     History of Present Illness:    John Kemp is a 78 y.o. male with a hx of paroxysmal atrial fibrillation maintaining sinus rhythm on low-dose amiodarone  bradycardia voiding AV blocking rate limiting medications chronic anticoagulation left bundle branch block hypertensive heart disease with diastolic heart failure CAD and hyperlipidemia last seen 06/20/2023.  Compliance with diet, lifestyle and medications: Yes  John Kemp is seen in the office along with his wife she is a retired Scientist, forensic She thinks he has done well except he tends to have a little bit more peripheral edema as well as periorbital edema He has not had any angina shortness of breath chest pain palpitation or syncope. Past Medical History:  Diagnosis Date   A-fib [facility])    Acquired thrombophilia (HCC) 11/17/2019   Acute bronchitis due to other specified organisms 10/28/2021   Acute cough 10/20/2021   Acute non-recurrent maxillary sinusitis 10/28/2021   Anticoagulant long-term use 10/11/2022   Anticoagulated 01/21/2015   Atherosclerotic heart disease of native coronary artery without angina pectoris    Atrial fibrillation with rapid ventricular response (HCC) 11/12/2021   Atrial flutter with rapid ventricular response (HCC) 11/12/2021   B12 deficiency 06/03/2022   Benign prostatic hyperplasia with urinary frequency 12/24/2021   Bilateral carotid artery stenosis 02/20/2022  CAD in native artery 01/21/2015   Cancer [facility])    skin cancer   Carotid artery occlusion    Chest tightness 11/13/2023   CHF (congestive heart failure) (HCC) 11/05/2019   Chronic diastolic (congestive) heart failure (HCC)    Chronic diastolic heart failure (HCC) 01/21/2015   Last Assessment & Plan:  Is congestive heart failure is mildly decompensated he is edematous he'll increase the dose of his diuretic today the lab work performed including an ARB referred to care  transitions for home care he'll follow-up in my office with me in 4-6 weeks daily weights sodium restrictionand compliance of medications beinghemoglobin of will   Chronic obstructive pulmonary disease (COPD) (HCC)    CKD stage 3b, GFR 30-44 ml/min (HCC) 12/11/2023   Class 1 obesity due to excess calories with serious comorbidity and body mass index (BMI) of 32.0 to 32.9 in adult    Coronary artery disease involving autologous artery coronary bypass graft 11/12/2021   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 11/17/2019   Dizziness 11/12/2021   Dysequilibrium 11/12/2021   Dyslipidemia 11/12/2021   Elevated brain natriuretic peptide (BNP) level 11/12/2021   Encounter for immunization 08/23/2022   Essential hypertension 01/21/2015   right superior quadrantanopsia   Hearing loss of right ear due to cerumen impaction 10/20/2021   High cholesterol 10/20/2017   Hx of CABG 03/02/2017   Hypertension    Hypertensive heart and kidney disease with chronic diastolic congestive heart failure and stage 3 chronic kidney disease, unspecified whether stage 3a or 3b CKD (HCC) 07/24/2023   Hypertensive heart disease with heart failure (HCC)    Hypothyroidism (acquired) 04/24/2018   Impaired fasting glucose    Left rotator cuff tear 11/12/2021   Mixed hyperlipidemia 11/17/2019   Myocardial infarction [facility])    Nasal septal deviation 10/11/2022   Nasal turbinate hypertrophy 10/11/2022   Neck pain 02/20/2022   Neural foraminal stenosis of cervical spine 02/28/2022   Non-seasonal allergic rhinitis due to pollen 10/20/2021   On amiodarone  therapy 01/21/2015   PAF (paroxysmal atrial fibrillation) (HCC)    Pedal edema 07/05/2022   Persistent atrial fibrillation (HCC) 11/12/2021   Physical deconditioning 12/07/2021   Primary insomnia    Seasonal allergies 11/13/2023   Sensorineural hearing loss (SNHL) of right ear with unrestricted hearing of left ear 05/26/2022   Sequela of cardioembolic stroke  96/78/7978   Severe obesity with body mass index (BMI) of 36.0 to 36.9 with serious comorbidity (HCC)    Squamous cell carcinoma 11/05/2019   Stage 3a chronic kidney disease (HCC) 07/24/2023   Subjective tinnitus of right ear 05/26/2022   Typical atrial flutter (HCC) 01/21/2015   Vitamin D  deficiency, unspecified     Current Medications: Current Meds  Medication Sig   acetaminophen  (TYLENOL ) 500 MG tablet Take 500 mg by mouth 2 (two) times daily.   amiodarone  (PACERONE ) 200 MG tablet Take 1 tablet (200 mg total) by mouth daily.   amLODipine  (NORVASC ) 5 MG tablet Take 1 tablet (5 mg total) by mouth every Monday, Wednesday, and Friday.   atorvastatin  (LIPITOR) 40 MG tablet Take 1 tablet (40 mg total) by mouth daily.   azelastine  (ASTELIN ) 0.1 % nasal spray Place 1 spray into both nostrils 2 (two) times daily. Use in each nostril as directed   cyanocobalamin  (VITAMIN B12) 1000 MCG/ML injection INJECT 1 ML (1,000 MCG) INTRAMUSCULARLY EVERY 30 DAYS   ELIQUIS  5 MG TABS tablet TAKE 1 TABLET BY MOUTH TWICE A DAY   finasteride  (PROSCAR ) 5  MG tablet Take 1 tablet (5 mg total) by mouth daily.   Inulin (FIBER CHOICE PO) Take 3 tablets by mouth daily as needed (Constipation).   ketoconazole (NIZORAL) 2 % shampoo Apply 1 Application topically 3 (three) times a week.   levothyroxine  (SYNTHROID ) 112 MCG tablet TAKE 1 TABLET BY MOUTH EVERY DAY   montelukast  (SINGULAIR ) 10 MG tablet TAKE 1 TABLET BY MOUTH EVERYDAY AT BEDTIME   nitroGLYCERIN  (NITROSTAT ) 0.4 MG SL tablet Place 1 tablet (0.4 mg total) under the tongue every 5 (five) minutes as needed for chest pain.   nystatin cream (MYCOSTATIN) Apply 1 Application topically 2 (two) times daily.   SYRINGE-NEEDLE, DISP, 3 ML (BD ECLIPSE SYRINGE/NEEDLE) 25G X 5/8 3 ML MISC Inject 1 ml of B12 every week for 4 weeks   tamsulosin  (FLOMAX ) 0.4 MG CAPS capsule TAKE 1 CAPSULE BY MOUTH EVERY DAY   torsemide  (DEMADEX ) 20 MG tablet Take 1 tablet (20 mg total) by mouth  daily. Take an additional Torsemide  at 12 noon on M - W - F.   triamcinolone  cream (KENALOG ) 0.1 % Apply 1 Application topically 2 (two) times daily.   valsartan  (DIOVAN ) 320 MG tablet TAKE 1 TABLET BY MOUTH EVERY DAY   Vitamin D , Ergocalciferol , (DRISDOL ) 1.25 MG (50000 UNIT) CAPS capsule TAKE 1 CAPSULE (50,000 UNITS TOTAL) BY MOUTH EVERY 7 (SEVEN) DAYS   zolpidem  (AMBIEN ) 10 MG tablet TAKE ONE TABLET BY MOUTH AT BEDTIME   [DISCONTINUED] amLODipine  (NORVASC ) 5 MG tablet TAKE 1 TABLET (5 MG TOTAL) BY MOUTH DAILY.   [DISCONTINUED] torsemide  (DEMADEX ) 20 MG tablet TAKE 1 TABLET BY MOUTH EVERY DAY      EKGs/Labs/Other Studies Reviewed:    The following studies were reviewed today:  Cardiac Studies & Procedures   ______________________________________________________________________________________________     ECHOCARDIOGRAM  ECHOCARDIOGRAM COMPLETE 02/15/2022  Narrative ECHOCARDIOGRAM REPORT    Patient Name:   KOBEN DAMAN Date of Exam: 02/15/2022 Medical Rec #:  [id number]         Height:       70.0 in Accession #:    [id number]        Weight:       225.0 lb Date of Birth:  [birth year]         BSA:          2.194 m Patient Age:    75 years          BP:           161/79 mmHg Patient Gender: M                 HR:           53 bpm. Exam Location:  [place]  Procedure: 2D Echo, Cardiac Doppler, Color Doppler and Intracardiac Opacification Agent  Indications:    Syncope R55, Dizziness [R42 (ICD-10-CM)]; Chronic diastolic heart failure (HCC) [I50.32 (ICD-10-CM)]  History:        Patient has prior history of Echocardiogram examinations, most recent 04/03/2017. CAD, Arrythmias:Atrial Fibrillation; Risk Factors:Hypertension and Dyslipidemia.  Sonographer:    LYNWOOD Silvas RDCS Referring Phys: [phone] KIRSTEN COX   Sonographer Comments: Suboptimal apical window. Global longitudinal strain was attempted. IMPRESSIONS   1. Left ventricular ejection fraction, by estimation, is 60 to  65%. The left ventricle has normal function. The left ventricle has no regional wall motion abnormalities. There is mild concentric left ventricular hypertrophy. Left ventricular diastolic parameters are consistent with Grade II diastolic dysfunction (pseudonormalization). Elevated left atrial pressure. 2. Right  ventricular systolic function is mildly reduced. The right ventricular size is not well visualized. Tricuspid regurgitation signal is inadequate for assessing PA pressure. 3. Left atrial size was mild to moderately dilated. 4. The mitral valve is normal in structure. No evidence of mitral valve regurgitation. No evidence of mitral stenosis. 5. The aortic valve is normal in structure. Aortic valve regurgitation is trivial. Aortic valve sclerosis/calcification is present, without any evidence of aortic stenosis. 6. Aortic Normal DTA. 7. The inferior vena cava is normal in size with greater than 50% respiratory variability, suggesting right atrial pressure of 3 mmHg.  FINDINGS Left Ventricle: Left ventricular ejection fraction, by estimation, is 60 to 65%. The left ventricle has normal function. The left ventricle has no regional wall motion abnormalities. Definity  contrast agent was given IV to delineate the left ventricular endocardial borders. Global longitudinal strain performed but not reported based on interpreter judgement due to suboptimal tracking. The left ventricular internal cavity size was normal in size. There is mild concentric left ventricular hypertrophy. Left ventricular diastolic parameters are consistent with Grade II diastolic dysfunction (pseudonormalization). Elevated left atrial pressure.  Right Ventricle: The right ventricular size is not well visualized. Right vetricular wall thickness was not assessed. Right ventricular systolic function is mildly reduced. Tricuspid regurgitation signal is inadequate for assessing PA pressure. The tricuspid regurgitant velocity is 1.32  m/s, and with an assumed right atrial pressure of 3 mmHg, the estimated right ventricular systolic pressure is 10.0 mmHg.  Left Atrium: Left atrial size was mild to moderately dilated.  Right Atrium: Right atrial size was not well visualized.  Pericardium: There is no evidence of pericardial effusion.  Mitral Valve: The mitral valve is normal in structure. No evidence of mitral valve regurgitation. No evidence of mitral valve stenosis.  Tricuspid Valve: The tricuspid valve is normal in structure. Tricuspid valve regurgitation is not demonstrated. No evidence of tricuspid stenosis.  Aortic Valve: The aortic valve is normal in structure. Aortic valve regurgitation is trivial. Aortic regurgitation PHT measures 667 msec. Aortic valve sclerosis/calcification is present, without any evidence of aortic stenosis.  Pulmonic Valve: The pulmonic valve was not well visualized. Pulmonic valve regurgitation is not visualized. No evidence of pulmonic stenosis.  Aorta: The aortic arch was not well visualized, the ascending aorta was not well visualized, the aortic root and ascending aorta are structurally normal, with no evidence of dilitation and Normal DTA.  Venous: The pulmonary veins were not well visualized. The inferior vena cava is normal in size with greater than 50% respiratory variability, suggesting right atrial pressure of 3 mmHg.  IAS/Shunts: No atrial level shunt detected by color flow Doppler.   LEFT VENTRICLE PLAX 2D LVIDd:         5.90 cm   Diastology LVIDs:         3.70 cm   LV e' medial:    4.73 cm/s LV PW:         1.30 cm   LV E/e' medial:  18.8 LV IVS:        1.20 cm   LV e' lateral:   7.99 cm/s LVOT diam:     2.00 cm   LV E/e' lateral: 11.1 LV SV:         92 LV SV Index:   42 LVOT Area:     3.14 cm   RIGHT VENTRICLE            IVC RV S prime:     6.46 cm/s  IVC diam: 1.30  cm TAPSE (M-mode): 1.8 cm  LEFT ATRIUM             Index        RIGHT ATRIUM           Index LA  diam:        4.50 cm 2.05 cm/m   RA Area:     19.70 cm LA Vol (A2C):   62.1 ml 28.30 ml/m  RA Volume:   61.00 ml  27.80 ml/m LA Vol (A4C):   51.0 ml 23.24 ml/m LA Biplane Vol: 56.3 ml 25.66 ml/m AORTIC VALVE             PULMONIC VALVE LVOT Vmax:   114.00 cm/s PR End Diast Vel: 3.82 msec LVOT Vmean:  74.600 cm/s LVOT VTI:    0.293 m AI PHT:      667 msec  AORTA Ao Root diam: 2.60 cm Ao Asc diam:  3.30 cm Ao Desc diam: 2.70 cm  MITRAL VALVE               TRICUSPID VALVE MV Area (PHT): 2.71 cm    TR Peak grad:   7.0 mmHg MV Decel Time: 280 msec    TR Vmax:        132.00 cm/s MV E velocity: 88.70 cm/s MV A velocity: 71.10 cm/s  SHUNTS MV E/A ratio:  1.25        Systemic VTI:  0.29 m Systemic Diam: 2.00 cm  Redell Leiter MD Electronically signed by Redell Leiter MD Signature Date/Time: 02/15/2022/5:52:43 PM    Final    MONITORS  CARDIAC EVENT MONITOR 01/05/2022  Narrative 30-day event monitor was performed 12/01/2021 to 12/30/2021. Rhythm throughout with sinus sinus bradycardia was noted to minimum rate of 40 bpm. Are no pauses of 3 seconds or greater there were no episodes of second or third-degree AV block.  There are no episodes of complex ventricular arrhythmia atrial fibrillation or flutter. There were no symptomatic events.       ______________________________________________________________________________________________      EKG Interpretation Date/Time:  Wednesday April 24 2024 09:57:23 EDT Ventricular Rate:  48 PR Interval:    QRS Duration:  118 QT Interval:  510 QTC Calculation: 455 R Axis:   -4  Text Interpretation: Sinus rhythm Inferior infarct (cited on or before 02-Feb-2022) When compared with ECG of 20-Jun-2023 10:32, Nonspecific T wave abnormality, worse in Inferior leads Confirmed by Leiter Redell ([id number]) on 04/24/2024 10:14:36 AM   Recent Labs: 11/02/2023: TSH 3.750 11/13/2023: Hemoglobin 13.3; Platelets 201 12/05/2023: ALT 18; BUN 18;  Creatinine, Ser 1.57; Potassium 4.3; Sodium 140  Recent Lipid Panel    Component Value Date/Time   CHOL 136 11/02/2023 0906   TRIG 101 11/02/2023 0906   HDL 45 11/02/2023 0906   CHOLHDL 3.0 11/02/2023 0906   CHOLHDL 5.9 04/02/2017 0213   VLDL 35 04/02/2017 0213   LDLCALC 72 11/02/2023 0906    Physical Exam:    VS:  BP 100/60   Pulse (!) 48   Ht 5' 10 (1.778 m)   Wt 233 lb (105.7 kg)   SpO2 95%   BMI 33.43 kg/m     Wt Readings from Last 3 Encounters:  04/24/24 233 lb (105.7 kg)  02/05/24 229 lb (103.9 kg)  12/11/23 233 lb (105.7 kg)     GEN: Obese well nourished, well developed in no acute distress HEENT: Normal NECK: No JVD; No carotid bruits LYMPHATICS: No lymphadenopathy CARDIAC: RRR, no murmurs, rubs, gallops RESPIRATORY:  Clear to auscultation without rales, wheezing or rhonchi  ABDOMEN: Soft, non-tender, non-distended MUSCULOSKELETAL:  No edema; No deformity  SKIN: Warm and dry NEUROLOGIC:  Alert and oriented x 3 PSYCHIATRIC:  Normal affect    Signed, Redell Leiter, MD  04/24/2024 12:02 PM    [facility]

## 2024-04-23 ENCOUNTER — Ambulatory Visit (HOSPITAL_BASED_OUTPATIENT_CLINIC_OR_DEPARTMENT_OTHER)
Admission: EM | Admit: 2024-04-23 | Discharge: 2024-04-23 | Disposition: A | Discharge status: Home / Self Care | Attending: Family Medicine | Admitting: Family Medicine

## 2024-04-23 ENCOUNTER — Encounter (HOSPITAL_BASED_OUTPATIENT_CLINIC_OR_DEPARTMENT_OTHER): Payer: Self-pay

## 2024-04-23 DIAGNOSIS — I10 Essential (primary) hypertension: Secondary | ICD-10-CM | POA: Insufficient documentation

## 2024-04-23 DIAGNOSIS — J069 Acute upper respiratory infection, unspecified: Secondary | ICD-10-CM

## 2024-04-23 DIAGNOSIS — I6529 Occlusion and stenosis of unspecified carotid artery: Secondary | ICD-10-CM | POA: Insufficient documentation

## 2024-04-23 HISTORY — DX: Obesity, class 1: E66.811

## 2024-04-23 HISTORY — DX: Other obesity due to excess calories: E66.09

## 2024-04-23 LAB — POC SOFIA SARS ANTIGEN FIA: SARS Coronavirus 2 Ag: NEGATIVE

## 2024-04-23 NOTE — ED Provider Notes (Signed)
 [facility]    CSN: [id number] Arrival date & time: 04/23/24  9085      History   Chief Complaint Chief Complaint  Patient presents with   Nasal Congestion    HPI Elizah Mierzwa is a 78 y.o. male.   Patient is a 78 year old male that presents today with  nasal congestion, cough-dry, body aches, possible fever, post nasal drainage, and sore throat for the last 2 days. Pt denies facial pain, chills, or HA. Pt has not taken anything for his symptoms.  Son recently traveled here from [place]  with similar symptoms     Past Medical History:  Diagnosis Date   A-fib [facility])    Acquired thrombophilia (HCC) 11/17/2019   Acute bronchitis due to other specified organisms 10/28/2021   Acute cough 10/20/2021   Acute non-recurrent maxillary sinusitis 10/28/2021   Anticoagulant long-term use 10/11/2022   Anticoagulated 01/21/2015   Atherosclerotic heart disease of native coronary artery without angina pectoris    Atrial fibrillation with rapid ventricular response (HCC) 11/12/2021   Atrial flutter with rapid ventricular response (HCC) 11/12/2021   B12 deficiency 06/03/2022   Benign prostatic hyperplasia with urinary frequency 12/24/2021   Bilateral carotid artery stenosis 02/20/2022   CAD in native artery 01/21/2015   Cancer [facility])    skin cancer   Carotid artery occlusion    Chest tightness 11/13/2023   CHF (congestive heart failure) (HCC) 11/05/2019   Chronic diastolic (congestive) heart failure (HCC)    Chronic diastolic heart failure (HCC) 01/21/2015   Last Assessment & Plan:  Is congestive heart failure is mildly decompensated he is edematous he'll increase the dose of his diuretic today the lab work performed including an ARB referred to care transitions for home care he'll follow-up in my office with me in 4-6 weeks daily weights sodium restrictionand compliance of medications beinghemoglobin of will   Chronic obstructive pulmonary disease (COPD) (HCC)    CKD stage  3b, GFR 30-44 ml/min (HCC) 12/11/2023   Class 1 obesity due to excess calories with serious comorbidity and body mass index (BMI) of 32.0 to 32.9 in adult    Coronary artery disease involving autologous artery coronary bypass graft 11/12/2021   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 11/17/2019   Dizziness 11/12/2021   Dysequilibrium 11/12/2021   Dyslipidemia 11/12/2021   Elevated brain natriuretic peptide (BNP) level 11/12/2021   Encounter for immunization 08/23/2022   Essential hypertension 01/21/2015   right superior quadrantanopsia   Hearing loss of right ear due to cerumen impaction 10/20/2021   High cholesterol 10/20/2017   Hx of CABG 03/02/2017   Hypertension    Hypertensive heart and kidney disease with chronic diastolic congestive heart failure and stage 3 chronic kidney disease, unspecified whether stage 3a or 3b CKD (HCC) 07/24/2023   Hypertensive heart disease with heart failure (HCC)    Hypothyroidism (acquired) 04/24/2018   Impaired fasting glucose    Left rotator cuff tear 11/12/2021   Mixed hyperlipidemia 11/17/2019   Myocardial infarction [facility])    Nasal septal deviation 10/11/2022   Nasal turbinate hypertrophy 10/11/2022   Neck pain 02/20/2022   Neural foraminal stenosis of cervical spine 02/28/2022   Non-seasonal allergic rhinitis due to pollen 10/20/2021   On amiodarone  therapy 01/21/2015   PAF (paroxysmal atrial fibrillation) (HCC)    Pedal edema 07/05/2022   Persistent atrial fibrillation (HCC) 11/12/2021   Physical deconditioning 12/07/2021   Primary insomnia    Seasonal allergies 11/13/2023   Sensorineural hearing loss (SNHL)  of right ear with unrestricted hearing of left ear 05/26/2022   Sequela of cardioembolic stroke 11/17/2019   Severe obesity with body mass index (BMI) of 36.0 to 36.9 with serious comorbidity (HCC)    Squamous cell carcinoma 11/05/2019   Stage 3a chronic kidney disease (HCC) 07/24/2023   Subjective tinnitus of right ear  05/26/2022   Typical atrial flutter (HCC) 01/21/2015   Vitamin D  deficiency, unspecified     Patient Active Problem List   Diagnosis Date Noted   Essential hypertension    Carotid artery occlusion    Hypertension    PAF (paroxysmal atrial fibrillation) (HCC) 02/07/2024   CKD stage 3b, GFR 30-44 ml/min (HCC) 12/11/2023   CAD in native artery 12/11/2023   Seasonal allergies 11/13/2023   Chest tightness 11/13/2023   Severe obesity with body mass index (BMI) of 36.0 to 36.9 with serious comorbidity (HCC) 11/05/2023   Hypertensive heart and kidney disease with chronic diastolic congestive heart failure and stage 3 chronic kidney disease, unspecified whether stage 3a or 3b CKD (HCC) 07/24/2023   Stage 3a chronic kidney disease (HCC) 07/24/2023   Anticoagulant long-term use 10/11/2022   Nasal septal deviation 10/11/2022   Nasal turbinate hypertrophy 10/11/2022   Encounter for immunization 08/23/2022   Pedal edema 07/05/2022   B12 deficiency 06/03/2022   Sensorineural hearing loss (SNHL) of right ear with unrestricted hearing of left ear 05/26/2022   Subjective tinnitus of right ear 05/26/2022   Neural foraminal stenosis of cervical spine 02/28/2022   Dizziness 02/20/2022   Bilateral carotid artery stenosis 02/20/2022   Neck pain 02/20/2022   Benign prostatic hyperplasia with urinary frequency 12/24/2021   Physical deconditioning 12/07/2021   Persistent atrial fibrillation (HCC) 11/12/2021   Coronary artery disease involving autologous artery coronary bypass graft 11/12/2021   Dysequilibrium 11/12/2021   Elevated brain natriuretic peptide (BNP) level 11/12/2021   Left rotator cuff tear 11/12/2021   A-fib (HCC) 11/12/2021   Atrial flutter with rapid ventricular response (HCC) 11/12/2021   Atrial fibrillation with rapid ventricular response (HCC) 11/12/2021   Acute non-recurrent maxillary sinusitis 10/28/2021   Acute bronchitis due to other specified organisms 10/28/2021    Non-seasonal allergic rhinitis due to pollen 10/20/2021   Hearing loss of right ear due to cerumen impaction 10/20/2021   Acute cough 10/20/2021   Vitamin D  deficiency, unspecified    Primary insomnia    Impaired fasting glucose    Hypertensive heart disease with heart failure (HCC)    Dyslipidemia    Myocardial infarction (HCC)    Chronic obstructive pulmonary disease (COPD) (HCC)    Cancer (HCC) 12/02/2019   Acquired thrombophilia (HCC) 11/17/2019   Sequela of cardioembolic stroke 11/17/2019   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 11/17/2019   Mixed hyperlipidemia 11/17/2019   Squamous cell carcinoma 11/05/2019   CHF (congestive heart failure) (HCC) 11/05/2019   Hypothyroidism (acquired) 04/24/2018   High cholesterol 10/20/2017   Class 1 obesity due to excess calories with serious comorbidity and body mass index (BMI) of 32.0 to 32.9 in adult    Hx of CABG 03/02/2017   Anticoagulated 01/21/2015   On amiodarone  therapy 01/21/2015   Typical atrial flutter (HCC) 01/21/2015   Atherosclerotic heart disease of native coronary artery without angina pectoris 01/21/2015   Chronic diastolic (congestive) heart failure (HCC) 01/21/2015    Past Surgical History:  Procedure Laterality Date   APPENDECTOMY  1956   CARDIAC CATHETERIZATION     CARDIOVERSION  2002   CHOLECYSTECTOMY  2006  CORONARY ARTERY BYPASS GRAFT  2001   HERNIA REPAIR     RADIOLOGY WITH ANESTHESIA Bilateral 01/18/2018   Procedure: MRI WITH ANESTHESIA LUMBAR SPINE WITH AND WITHOUT CONTRAST;  Surgeon: Radiologist, Medication, MD;  Location: [facility] OR;  Service: Radiology;  Laterality: Bilateral;   RADIOLOGY WITH ANESTHESIA N/A 04/19/2022   Procedure: MRI WITH BRAIN WITH AND WITHOUT CONTRAST;  Surgeon: Radiologist, Medication, MD;  Location: [facility] OR;  Service: Radiology;  Laterality: N/A;   ROTATOR CUFF REPAIR Left    TONSILLECTOMY  1975       Home Medications    Prior to Admission medications   Medication  Sig Start Date End Date Taking? Authorizing Provider  acetaminophen  (TYLENOL ) 500 MG tablet Take 500 mg by mouth 2 (two) times daily.    [provider]  amiodarone  (PACERONE ) 200 MG tablet Take 1 tablet (200 mg total) by mouth daily. 09/27/23   Monetta Redell PARAS, MD  amLODipine  (NORVASC ) 5 MG tablet TAKE 1 TABLET (5 MG TOTAL) BY MOUTH DAILY. 08/21/23   Monetta Redell PARAS, MD  atorvastatin  (LIPITOR) 40 MG tablet Take 1 tablet (40 mg total) by mouth daily. 02/05/24   CoxAbigail, MD  azelastine  (ASTELIN ) 0.1 % nasal spray Place 1 spray into both nostrils 2 (two) times daily. Use in each nostril as directed 12/04/23   Teressa Harrie HERO, FNP  cyanocobalamin  (VITAMIN B12) 1000 MCG/ML injection INJECT 1 ML (1,000 MCG) INTRAMUSCULARLY EVERY 30 DAYS 01/22/24   Cox, Abigail, MD  ELIQUIS  5 MG TABS tablet TAKE 1 TABLET BY MOUTH TWICE A DAY 11/09/23   Cox, Abigail, MD  finasteride  (PROSCAR ) 5 MG tablet Take 1 tablet (5 mg total) by mouth daily. 12/31/21   Cox, Abigail, MD  Inulin (FIBER CHOICE PO) Take 3 tablets by mouth daily as needed (Constipation).    [provider]  ketoconazole (NIZORAL) 2 % shampoo Apply 1 Application topically 3 (three) times a week. 07/20/22   [provider]  levothyroxine  (SYNTHROID ) 112 MCG tablet TAKE 1 TABLET BY MOUTH EVERY DAY 08/29/23   Cox, Abigail, MD  montelukast  (SINGULAIR ) 10 MG tablet TAKE 1 TABLET BY MOUTH EVERYDAY AT BEDTIME 02/08/24   Teressa Harrie HERO, FNP  nitroGLYCERIN  (NITROSTAT ) 0.4 MG SL tablet Place 1 tablet (0.4 mg total) under the tongue every 5 (five) minutes as needed for chest pain. 07/24/23   Cox, Abigail, MD  nystatin cream (MYCOSTATIN) Apply 1 Application topically 2 (two) times daily. 07/07/22   [provider]  SYRINGE-NEEDLE, DISP, 3 ML (BD ECLIPSE SYRINGE/NEEDLE) 25G X 5/8 3 ML MISC Inject 1 ml of B12 every week for 4 weeks 07/31/23   Cox, Abigail, MD  tamsulosin  (FLOMAX ) 0.4 MG CAPS capsule TAKE 1 CAPSULE BY MOUTH EVERY DAY 02/11/24    Cox, Kirsten, MD  torsemide  (DEMADEX ) 20 MG tablet TAKE 1 TABLET BY MOUTH EVERY DAY 04/12/24   Cox, Abigail, MD  triamcinolone  cream (KENALOG ) 0.1 % Apply 1 Application topically 2 (two) times daily. 07/07/22   [provider]  valsartan  (DIOVAN ) 320 MG tablet TAKE 1 TABLET BY MOUTH EVERY DAY 02/11/24   Cox, Kirsten, MD  Vitamin D , Ergocalciferol , (DRISDOL ) 1.25 MG (50000 UNIT) CAPS capsule TAKE 1 CAPSULE (50,000 UNITS TOTAL) BY MOUTH EVERY 7 (SEVEN) DAYS 11/24/23   Cox, Kirsten, MD  zolpidem  (AMBIEN ) 10 MG tablet TAKE ONE TABLET BY MOUTH AT BEDTIME 01/28/24   CoxAbigail, MD    Family History Family History  Problem Relation Age of Onset   Heart disease  Father    Arrhythmia Father    Breast cancer Mother    Diabetes type II Sister    Diabetes Brother     Social History Social History   Tobacco Use   Smoking status: Former    Current packs/day: 0.00    Average packs/day: 1 pack/day for 15.0 years (15.0 ttl pk-yrs)    Types: Cigarettes    Start date: 08/30/1963    Quit date: 08/29/1978    Years since quitting: 45.6    Passive exposure: Past   Smokeless tobacco: Never  Vaping Use   Vaping status: Never Used  Substance Use Topics   Alcohol use: No    Alcohol/week: 0.0 standard drinks of alcohol   Drug use: No     Allergies   Pravastatin , Prednisone , Ozempic  (0.25 or 0.5 mg-dose) [semaglutide (0.25 or 0.5mg -dos)], Diltiazem hcl, Ezetimibe, Kerendia  [finerenone ], Moxifloxacin hcl, and Pravachol  [pravastatin  sodium]   Review of Systems Review of Systems See HPI  Physical Exam Triage Vital Signs ED Triage Vitals  Encounter Vitals Group     BP 04/23/24 0950 134/75     Girls Systolic BP Percentile --      Girls Diastolic BP Percentile --      Boys Systolic BP Percentile --      Boys Diastolic BP Percentile --      Pulse Rate 04/23/24 0950 (!) 53     Resp 04/23/24 0950 20     Temp 04/23/24 0950 98.3 F (36.8 C)     Temp Source 04/23/24 0950 Oral     SpO2 04/23/24  0950 94 %     Weight --      Height --      Head Circumference --      Peak Flow --      Pain Score 04/23/24 0948 0     Pain Loc --      Pain Education --      Exclude from Growth Chart --    No data found.  Updated Vital Signs BP 134/75 (BP Location: Right Arm)   Pulse (!) 53   Temp 98.3 F (36.8 C) (Oral)   Resp 20   SpO2 94%   Visual Acuity Right Eye Distance:   Left Eye Distance:   Bilateral Distance:    Right Eye Near:   Left Eye Near:    Bilateral Near:     Physical Exam Constitutional:      General: He is not in acute distress.    Appearance: Normal appearance. He is not ill-appearing, toxic-appearing or diaphoretic.  HENT:     Right Ear: Tympanic membrane, ear canal and external ear normal.     Left Ear: Tympanic membrane, ear canal and external ear normal.     Mouth/Throat:     Pharynx: Oropharynx is clear.  Eyes:     Conjunctiva/sclera: Conjunctivae normal.  Cardiovascular:     Rate and Rhythm: Normal rate and regular rhythm.     Pulses: Normal pulses.     Heart sounds: Normal heart sounds.  Pulmonary:     Effort: Pulmonary effort is normal.     Breath sounds: Normal breath sounds.  Musculoskeletal:        General: Normal range of motion.  Skin:    General: Skin is warm and dry.  Neurological:     Mental Status: He is alert.  Psychiatric:        Mood and Affect: Mood normal.      UC Treatments / Results  Labs (all labs ordered are listed, but only abnormal results are displayed) Labs Reviewed  POC SOFIA SARS ANTIGEN FIA - Normal    EKG   Radiology No results found.  Procedures Procedures (including critical care time)  Medications Ordered in UC Medications - No data to display  Initial Impression / Assessment and Plan / UC Course  I have reviewed the triage vital signs and the nursing notes.  Pertinent labs & imaging results that were available during my care of the patient were reviewed by me and considered in my medical  decision making (see chart for details).     Viral illness-no concerns on exam.  Recommend symptomatic treatment for relief of symptoms. COVID test negative Rest, hydrate and follow-up as needed  Final Clinical Impressions(s) / UC Diagnoses   Final diagnoses:  Viral URI with cough     Discharge Instructions      This is most likely a virus. You can take OTC medications for symptoms as needed. Mucinex,Tylenol .  Rest, Hydrate.  Follow up as needed.     ED Prescriptions   None    PDMP not reviewed this encounter.   Adah Wilbert LABOR, FNP 04/23/24 1320

## 2024-04-23 NOTE — Discharge Instructions (Addendum)
 This is most likely a virus. You can take OTC medications for symptoms as needed. Mucinex,Tylenol .  Rest, Hydrate.  Follow up as needed.

## 2024-04-23 NOTE — ED Triage Notes (Signed)
 Pt c/o nasal congestion, cough-dry, body aches, possible fever, post nasal drainage, and sore throat for the last 2 days. Pt denies facial pain, chills, or HA. Pt has not taken anything for his symptoms.

## 2024-04-24 ENCOUNTER — Encounter: Payer: Self-pay | Admitting: Cardiology

## 2024-04-24 ENCOUNTER — Ambulatory Visit: Attending: Cardiology | Admitting: Cardiology

## 2024-04-24 VITALS — BP 100/60 | HR 48 | Ht 70.0 in | Wt 233.0 lb

## 2024-04-24 DIAGNOSIS — Z7901 Long term (current) use of anticoagulants: Secondary | ICD-10-CM | POA: Insufficient documentation

## 2024-04-24 DIAGNOSIS — I480 Paroxysmal atrial fibrillation: Secondary | ICD-10-CM | POA: Diagnosis not present

## 2024-04-24 DIAGNOSIS — E039 Hypothyroidism, unspecified: Secondary | ICD-10-CM | POA: Insufficient documentation

## 2024-04-24 DIAGNOSIS — I2510 Atherosclerotic heart disease of native coronary artery without angina pectoris: Secondary | ICD-10-CM | POA: Insufficient documentation

## 2024-04-24 DIAGNOSIS — I110 Hypertensive heart disease with heart failure: Secondary | ICD-10-CM | POA: Diagnosis not present

## 2024-04-24 DIAGNOSIS — I447 Left bundle-branch block, unspecified: Secondary | ICD-10-CM | POA: Diagnosis not present

## 2024-04-24 DIAGNOSIS — N1832 Chronic kidney disease, stage 3b: Secondary | ICD-10-CM | POA: Diagnosis not present

## 2024-04-24 DIAGNOSIS — Z79899 Other long term (current) drug therapy: Secondary | ICD-10-CM | POA: Diagnosis not present

## 2024-04-24 DIAGNOSIS — I5032 Chronic diastolic (congestive) heart failure: Secondary | ICD-10-CM | POA: Diagnosis not present

## 2024-04-24 DIAGNOSIS — R001 Bradycardia, unspecified: Secondary | ICD-10-CM | POA: Insufficient documentation

## 2024-04-24 DIAGNOSIS — E7800 Pure hypercholesterolemia, unspecified: Secondary | ICD-10-CM | POA: Diagnosis not present

## 2024-04-24 MED ORDER — AMLODIPINE BESYLATE 5 MG PO TABS: 5.0000 mg | ORAL_TABLET | ORAL | 3 refills | Status: AC

## 2024-04-24 MED ORDER — TORSEMIDE 20 MG PO TABS: 20.0000 mg | ORAL_TABLET | Freq: Every day | ORAL | 3 refills | Status: DC

## 2024-04-24 NOTE — Patient Instructions (Signed)
 Medication Instructions:  Your physician has recommended you make the following change in your medication:   START: Torsemide  20 mg daily (Take an extra Torsemide  at 12 noon on M - W - F) START: Amlodipine  5 mg every M-W-F  *If you need a refill on your cardiac medications before your next appointment, please call your pharmacy*  Lab Work: None If you have labs (blood work) drawn today and your tests are completely normal, you will receive your results only by: MyChart Message (if you have MyChart) OR A paper copy in the mail If you have any lab test that is abnormal or we need to change your treatment, we will call you to review the results.  Testing/Procedures: None  Follow-Up: At [facility], you and your health needs are our priority.  As part of our continuing mission to provide you with exceptional heart care, our providers are all part of one team.  This team includes your primary Cardiologist (physician) and Advanced Practice Providers or APPs (Physician Assistants and Nurse Practitioners) who all work together to provide you with the care you need, when you need it.  Your next appointment:   6 month(s)  Provider:   Redell Leiter, MD    We recommend signing up for the patient portal called MyChart.  Sign up information is provided on this After Visit Summary.  MyChart is used to connect with patients for Virtual Visits (Telemedicine).  Patients are able to view lab/test results, encounter notes, upcoming appointments, etc.  Non-urgent messages can be sent to your provider as well.   To learn more about what you can do with MyChart, go to [email or web address].   Other Instructions None

## 2024-04-27 ENCOUNTER — Other Ambulatory Visit: Payer: Self-pay | Admitting: Family Medicine

## 2024-04-30 ENCOUNTER — Other Ambulatory Visit: Payer: Self-pay | Admitting: Family Medicine

## 2024-04-30 DIAGNOSIS — F5101 Primary insomnia: Secondary | ICD-10-CM

## 2024-05-03 IMAGING — CT CT HEAD W/O CM
4 series · 16 of 47 positions shown, 18 images · non-contrast
Comparison: CT head 11/30/2021

CLINICAL DATA: Dizziness, persistent/recurrent, cardiac or vascular
cause suspected



[Series 3: head wo · axial · 0.44mm/px · z∈[-102,+24]mm · 7 of 35 slices shown, 9 images]
[im 5/35  brain]
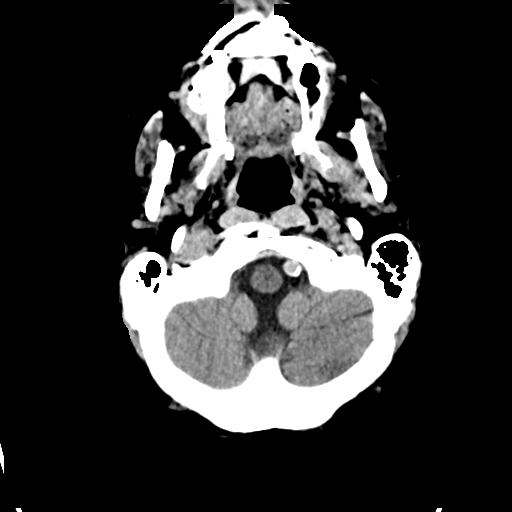
[im 5/35  bone]
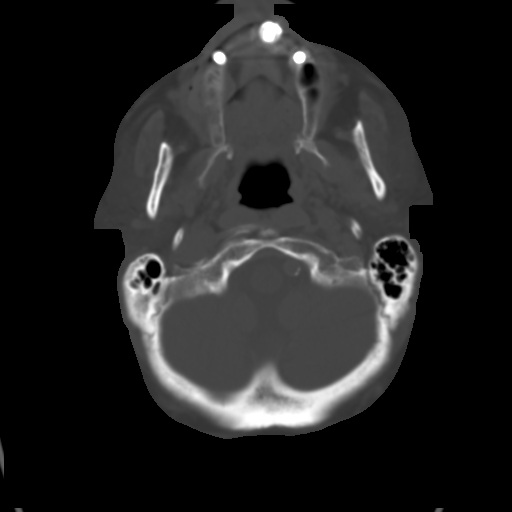
[im 9/35  brain]
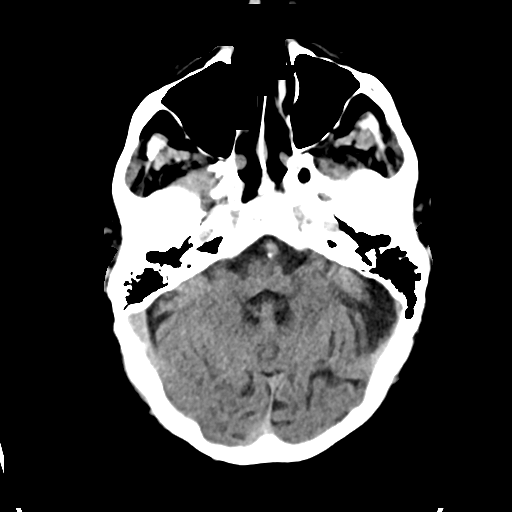
[im 13/35  brain]
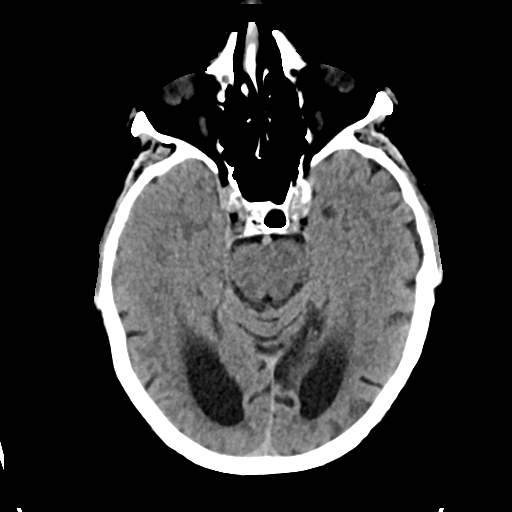
[im 18/35  brain]
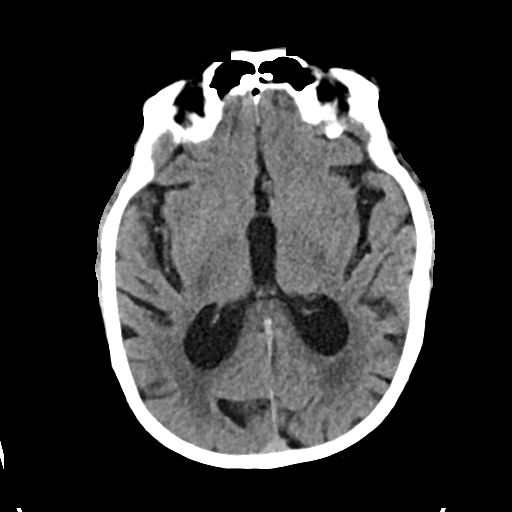
[im 22/35  brain]
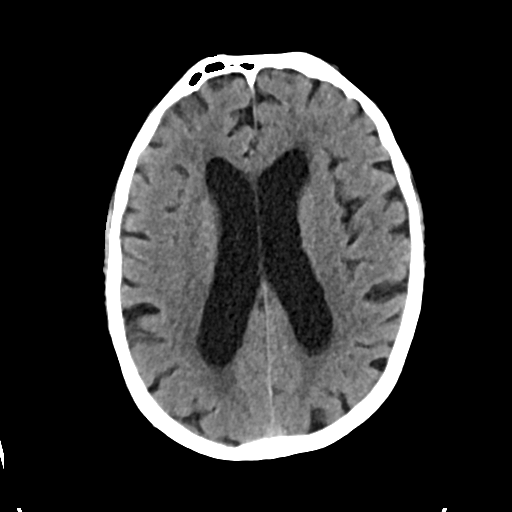
[im 22/35  bone]
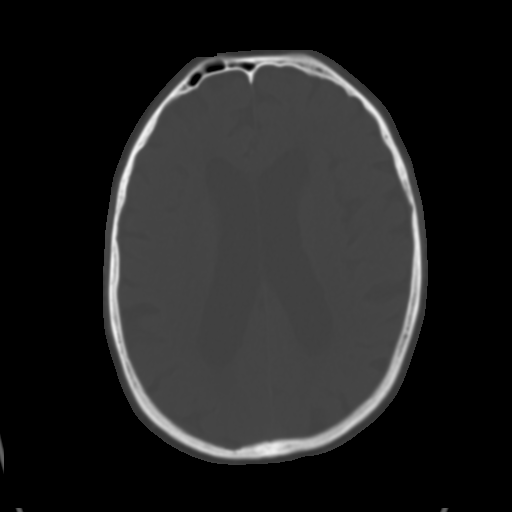
[im 26/35  brain]
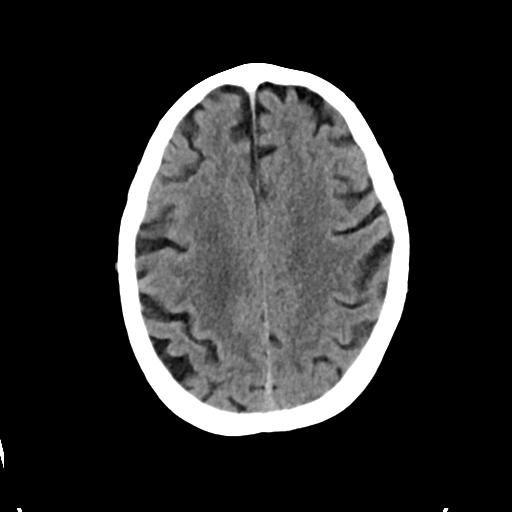
[im 30/35  brain]
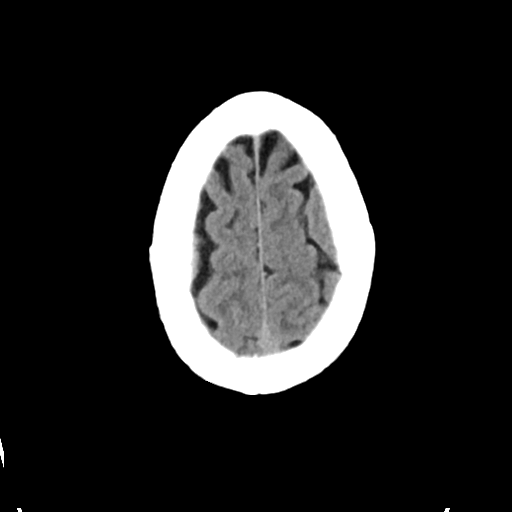

[Series 4: head bone (person_name) · axial · 0.44mm/px · z∈[-106,-70]mm · 3 of 88 slices shown]
[im 9/88  bone]
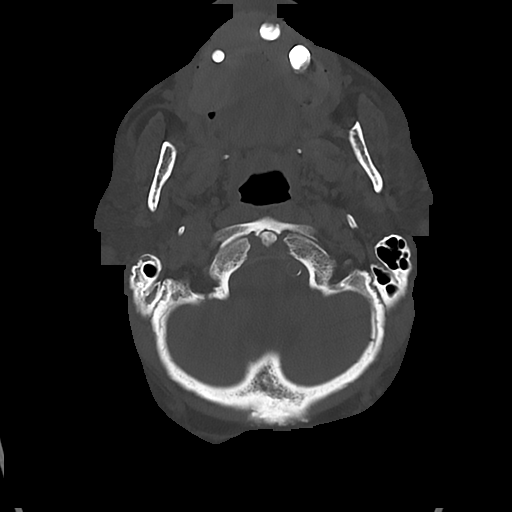
[im 18/88  bone]
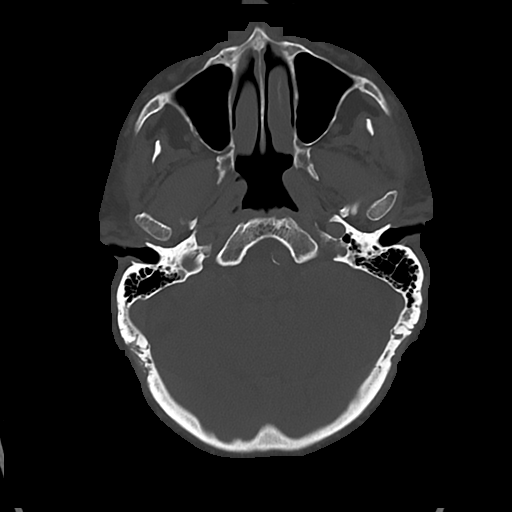
[im 27/88  bone]
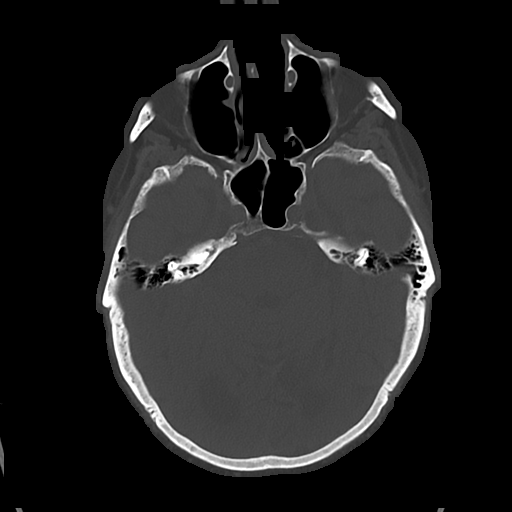

[Series 5: cor soft (person_name) · coronal · 0.34mm/px · 3 of 70 slices shown]
[im 24/70  brain]
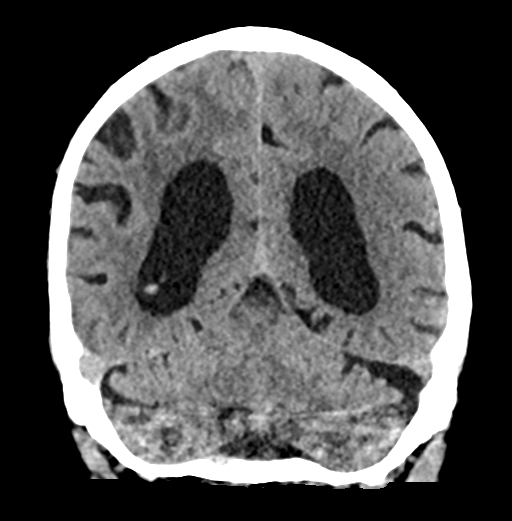
[im 31/70  brain]
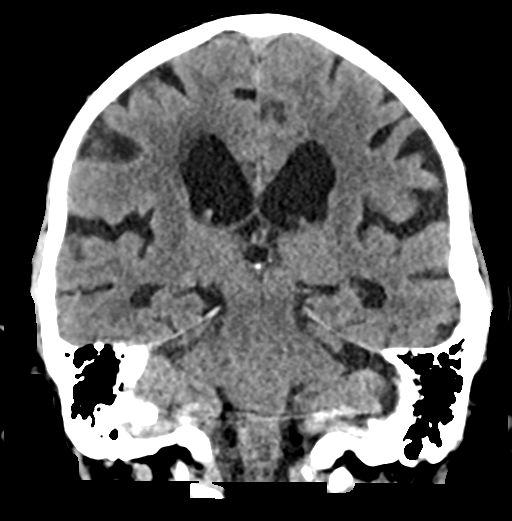
[im 39/70  brain]
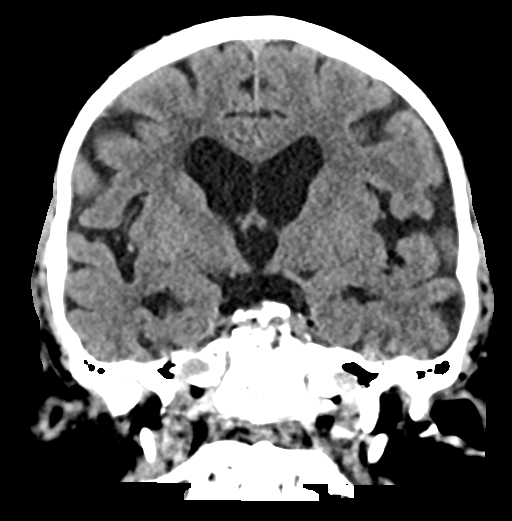

[Series 6: sag soft (person_name) · sagittal · 0.34mm/px · 3 of 62 slices shown]
[im 21/62  brain]
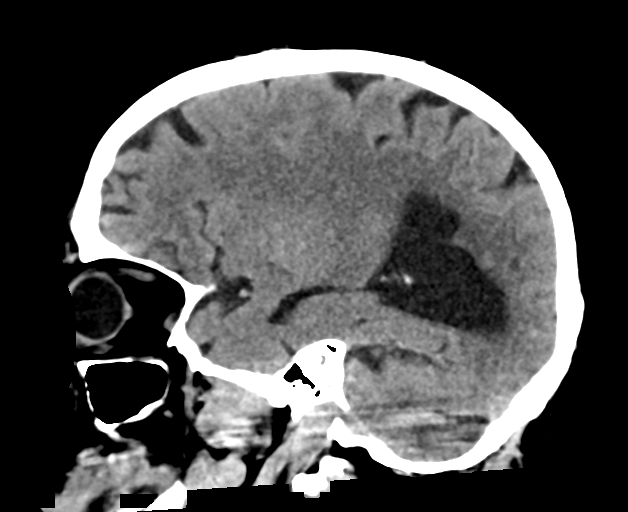
[im 31/62  brain]
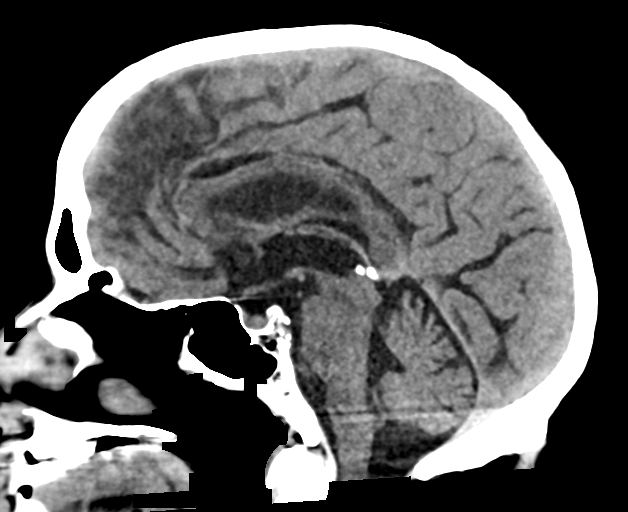
[im 41/62  brain]
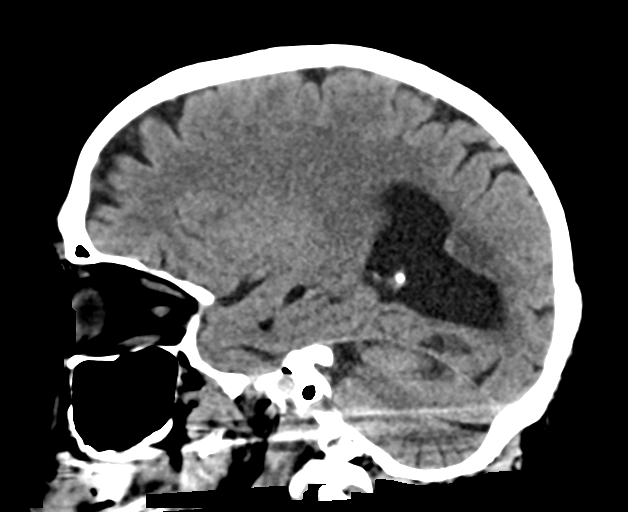

[16 of 47 positions shown; findings below may reference images not displayed]

BRAIN:
BRAIN
Cerebral ventricle sizes are concordant with the degree of cerebral
volume loss. Patchy and confluent areas of decreased attenuation are
noted throughout the deep and periventricular white matter of the
cerebral hemispheres bilaterally, compatible with chronic
microvascular ischemic disease.

No evidence of large-territorial acute infarction. No parenchymal
hemorrhage. No mass lesion. No extra-axial collection.

No mass effect or midline shift. No hydrocephalus. Basilar cisterns
are patent.

Vascular: No hyperdense vessel. Atherosclerotic calcifications are
present within the cavernous internal carotid and vertebral
arteries.

Skull: No acute fracture or focal lesion.

Sinuses/Orbits: Paranasal sinuses and mastoid air cells are clear.
Bilateral lens replacement. The orbits are unremarkable.

Other: None.
IMPRESSION: No acute intracranial abnormality.

## 2024-05-08 ENCOUNTER — Other Ambulatory Visit: Payer: Self-pay | Admitting: Family Medicine

## 2024-05-08 DIAGNOSIS — E782 Mixed hyperlipidemia: Secondary | ICD-10-CM

## 2024-05-10 ENCOUNTER — Ambulatory Visit (INDEPENDENT_AMBULATORY_CARE_PROVIDER_SITE_OTHER): Admitting: Family Medicine

## 2024-05-10 VITALS — BP 128/68 | HR 55 | Temp 98.2°F | Ht 70.0 in | Wt 232.0 lb

## 2024-05-10 DIAGNOSIS — Z6833 Body mass index (BMI) 33.0-33.9, adult: Secondary | ICD-10-CM

## 2024-05-10 DIAGNOSIS — E782 Mixed hyperlipidemia: Secondary | ICD-10-CM

## 2024-05-10 DIAGNOSIS — E6609 Other obesity due to excess calories: Secondary | ICD-10-CM

## 2024-05-10 DIAGNOSIS — R7301 Impaired fasting glucose: Secondary | ICD-10-CM

## 2024-05-10 DIAGNOSIS — I4819 Other persistent atrial fibrillation: Secondary | ICD-10-CM

## 2024-05-10 DIAGNOSIS — E66811 Obesity, class 1: Secondary | ICD-10-CM | POA: Diagnosis not present

## 2024-05-10 DIAGNOSIS — I110 Hypertensive heart disease with heart failure: Secondary | ICD-10-CM

## 2024-05-10 DIAGNOSIS — E039 Hypothyroidism, unspecified: Secondary | ICD-10-CM

## 2024-05-10 DIAGNOSIS — N1832 Chronic kidney disease, stage 3b: Secondary | ICD-10-CM

## 2024-05-10 DIAGNOSIS — F5101 Primary insomnia: Secondary | ICD-10-CM

## 2024-05-10 MED ORDER — DAPAGLIFLOZIN PROPANEDIOL 5 MG PO TABS: 5.0000 mg | ORAL_TABLET | Freq: Every day | ORAL | 1 refills | Status: DC

## 2024-05-10 NOTE — Progress Notes (Signed)
 Subjective:  Patient ID: John Kemp, male    DOB: [birth year]  Age: 78 y.o. MRN: [id number]  Chief Complaint  Patient presents with   Medical Management of Chronic Issues    DOES NOT AGREE TO AI. WANTS TO WAIT UNTIL OCT FOR FLU VACCINE    HPI: Prediabetes: did not tolerate ozempic . Caused nausea.   Afib: Taking Eliquis  5 mg twice a day, Amiodarone  200 mg daily.  Hypertension: Takes Amlodipine  5 mg M/W/friday, Valsartan  320 mg daily. Torsemide  20 mg M, W, friday  Hypothyroidism: Currently on Levothyroxine  112 mcg daily.  BPH: Tamsulosin  0.4 mg daily, Finasteride  5 mg daily. No trouble urinating.   Hyperlipidemia: Atorvastatin  20 mg every day.  CONGESTIVE HEART FAILURE/CAD:  Vitamin D  deficiency: vitamin D   50K weekly.   Insomnia: ambien  10 mg before bed. Must take every night to sleep.   Runny nose/congestion       02/05/2024    8:21 AM 11/02/2023    8:48 AM 09/11/2023    2:02 PM 07/24/2023    8:33 AM 07/24/2023    8:03 AM  Depression screen PHQ 2/9  Decreased Interest 0 0 0 0 0  Down, Depressed, Hopeless 0 0 0 0 0  PHQ - 2 Score 0 0 0 0 0        02/05/2024    8:21 AM  Fall Risk   Falls in the past year? 0  Number falls in past yr: 0  Injury with Fall? 0  Risk for fall due to : No Fall Risks  Follow up Falls evaluation completed    Patient Care Team: Sherre Clapper, MD as PCP - General (Family Medicine) Nyle Rankin POUR, [facility] (Inactive) as Pharmacist (Pharmacist) Jinx Grate, AUD (Audiology) Monetta Redell PARAS, MD as Consulting Physician (Cardiology)   Review of Systems  Constitutional:  Negative for chills and fever.  HENT:  Positive for congestion and rhinorrhea. Negative for ear pain and sore throat.   Respiratory:  Negative for cough and shortness of breath.   Cardiovascular:  Negative for chest pain.  Gastrointestinal:  Negative for abdominal pain, constipation, nausea and vomiting.  Genitourinary:  Negative for frequency.  Musculoskeletal:   Negative for arthralgias and myalgias.  Psychiatric/Behavioral:  Negative for dysphoric mood. The patient is not nervous/anxious.     Current Outpatient Medications on File Prior to Visit  Medication Sig Dispense Refill   acetaminophen  (TYLENOL ) 500 MG tablet Take 500 mg by mouth 2 (two) times daily.     amiodarone  (PACERONE ) 200 MG tablet Take 1 tablet (200 mg total) by mouth daily. 90 tablet 2   amLODipine  (NORVASC ) 5 MG tablet Take 1 tablet (5 mg total) by mouth every Monday, Wednesday, and Friday. 45 tablet 3   atorvastatin  (LIPITOR) 40 MG tablet TAKE 1 TABLET BY MOUTH EVERY DAY 90 tablet 0   azelastine  (ASTELIN ) 0.1 % nasal spray Place 1 spray into both nostrils 2 (two) times daily. Use in each nostril as directed 30 mL 12   cyanocobalamin  (VITAMIN B12) 1000 MCG/ML injection INJECT 1 ML (1,000 MCG) INTRAMUSCULARLY EVERY 30 DAYS 3 mL 1   ELIQUIS  5 MG TABS tablet TAKE 1 TABLET BY MOUTH TWICE A DAY 60 tablet 5   finasteride  (PROSCAR ) 5 MG tablet Take 1 tablet (5 mg total) by mouth daily. 90 tablet 1   Inulin (FIBER CHOICE PO) Take 3 tablets by mouth daily as needed (Constipation).     ketoconazole (NIZORAL) 2 % shampoo Apply 1 Application topically 3 (three)  times a week.     levothyroxine  (SYNTHROID ) 112 MCG tablet TAKE 1 TABLET BY MOUTH EVERY DAY 90 tablet 3   montelukast  (SINGULAIR ) 10 MG tablet TAKE 1 TABLET BY MOUTH EVERYDAY AT BEDTIME 90 tablet 1   nitroGLYCERIN  (NITROSTAT ) 0.4 MG SL tablet Place 1 tablet (0.4 mg total) under the tongue every 5 (five) minutes as needed for chest pain. 25 tablet 2   nystatin cream (MYCOSTATIN) Apply 1 Application topically 2 (two) times daily.     SYRINGE-NEEDLE, DISP, 3 ML (BD ECLIPSE SYRINGE/NEEDLE) 25G X 5/8 3 ML MISC Inject 1 ml of B12 every week for 4 weeks 50 each 0   tamsulosin  (FLOMAX ) 0.4 MG CAPS capsule TAKE 1 CAPSULE BY MOUTH EVERY DAY 90 capsule 1   torsemide  (DEMADEX ) 20 MG tablet Take 1 tablet (20 mg total) by mouth daily. Take an  additional Torsemide  at 12 noon on M - W - F. 180 tablet 3   triamcinolone  cream (KENALOG ) 0.1 % Apply 1 Application topically 2 (two) times daily.     valsartan  (DIOVAN ) 320 MG tablet TAKE 1 TABLET BY MOUTH EVERY DAY 90 tablet 1   Vitamin D , Ergocalciferol , (DRISDOL ) 1.25 MG (50000 UNIT) CAPS capsule TAKE 1 CAPSULE (50,000 UNITS TOTAL) BY MOUTH EVERY 7 (SEVEN) DAYS 12 capsule 2   zolpidem  (AMBIEN ) 10 MG tablet TAKE ONE TABLET BY MOUTH AT BEDTIME 90 tablet 1   No current facility-administered medications on file prior to visit.   Past Medical History:  Diagnosis Date   A-fib (HCC)    Acute bronchitis due to other specified organisms 10/28/2021   Anticoagulant long-term use 10/11/2022   Atherosclerotic heart disease of native coronary artery without angina pectoris    Atrial fibrillation with rapid ventricular response (HCC) 11/12/2021   Atrial flutter with rapid ventricular response (HCC) 11/12/2021   B12 deficiency 06/03/2022   Benign prostatic hyperplasia with urinary frequency 12/24/2021   Bilateral carotid artery stenosis 02/20/2022   CAD in native artery 01/21/2015   Cancer [facility])    skin cancer   Carotid artery occlusion    CHF (congestive heart failure) (HCC) 11/05/2019   Chronic diastolic (congestive) heart failure (HCC)    Chronic obstructive pulmonary disease (COPD) (HCC)    CKD stage 3b, GFR 30-44 ml/min (HCC) 12/11/2023   Class 1 obesity due to excess calories with serious comorbidity and body mass index (BMI) of 32.0 to 32.9 in adult    Coronary artery disease involving autologous artery coronary bypass graft 11/12/2021   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 11/17/2019   Dysequilibrium 11/12/2021   Dyslipidemia 11/12/2021   Elevated brain natriuretic peptide (BNP) level 11/12/2021   Encounter for immunization 08/23/2022   Essential hypertension 01/21/2015   right superior quadrantanopsia   Hearing loss of right ear due to cerumen impaction 10/20/2021    High cholesterol 10/20/2017   Hx of CABG 03/02/2017   Hypertension    Hypertensive heart and kidney disease with chronic diastolic congestive heart failure and stage 3 chronic kidney disease, unspecified whether stage 3a or 3b CKD (HCC) 07/24/2023   Hypertensive heart disease with heart failure (HCC)    Hypothyroidism (acquired) 04/24/2018   Impaired fasting glucose    Left rotator cuff tear 11/12/2021   Mixed hyperlipidemia 11/17/2019   Myocardial infarction [facility])    Nasal septal deviation 10/11/2022   Nasal turbinate hypertrophy 10/11/2022   Neck pain 02/20/2022   Neural foraminal stenosis of cervical spine 02/28/2022   Non-seasonal allergic rhinitis due to  pollen 10/20/2021   On amiodarone  therapy 01/21/2015   PAF (paroxysmal atrial fibrillation) (HCC)    Pedal edema 07/05/2022   Persistent atrial fibrillation (HCC) 11/12/2021   Physical deconditioning 12/07/2021   Primary insomnia    Seasonal allergies 11/13/2023   Sensorineural hearing loss (SNHL) of right ear with unrestricted hearing of left ear 05/26/2022   Sequela of cardioembolic stroke 11/17/2019   Severe obesity with body mass index (BMI) of 36.0 to 36.9 with serious comorbidity (HCC)    Squamous cell carcinoma 11/05/2019   Subjective tinnitus of right ear 05/26/2022   Vitamin D  deficiency, unspecified    Past Surgical History:  Procedure Laterality Date   APPENDECTOMY  1956   CARDIAC CATHETERIZATION     CARDIOVERSION  2002   CHOLECYSTECTOMY  2006   CORONARY ARTERY BYPASS GRAFT  2001   HERNIA REPAIR     RADIOLOGY WITH ANESTHESIA Bilateral 01/18/2018   Procedure: MRI WITH ANESTHESIA LUMBAR SPINE WITH AND WITHOUT CONTRAST;  Surgeon: Radiologist, Medication, MD;  Location: [facility] OR;  Service: Radiology;  Laterality: Bilateral;   RADIOLOGY WITH ANESTHESIA N/A 04/19/2022   Procedure: MRI WITH BRAIN WITH AND WITHOUT CONTRAST;  Surgeon: Radiologist, Medication, MD;  Location: [facility] OR;  Service: Radiology;  Laterality: N/A;    ROTATOR CUFF REPAIR Left    TONSILLECTOMY  1975    Family History  Problem Relation Age of Onset   Heart disease Father    Arrhythmia Father    Breast cancer Mother    Diabetes type II Sister    Diabetes Brother    Social History   Socioeconomic History   Marital status: Married    Spouse name: Devere   Number of children: 2   Years of education: Not on file   Highest education level: Not on file  Occupational History   Occupation: Retired  Tobacco Use   Smoking status: Former    Current packs/day: 0.00    Average packs/day: 1 pack/day for 15.0 years (15.0 ttl pk-yrs)    Types: Cigarettes    Start date: 08/30/1963    Quit date: 08/29/1978    Years since quitting: 45.7    Passive exposure: Past   Smokeless tobacco: Never  Vaping Use   Vaping status: Never Used  Substance and Sexual Activity   Alcohol use: No    Alcohol/week: 0.0 standard drinks of alcohol   Drug use: No   Sexual activity: Not Currently  Other Topics Concern   Not on file  Social History Narrative   Not on file   Social Drivers of Health   Financial Resource Strain: Low Risk  (09/11/2023)   Overall Financial Resource Strain (CARDIA)    Difficulty of Paying Living Expenses: Not very hard  Food Insecurity: No Food Insecurity (09/11/2023)   Hunger Vital Sign    Worried About Running Out of Food in the Last Year: Never true    Ran Out of Food in the Last Year: Never true  Transportation Needs: No Transportation Needs (09/11/2023)   PRAPARE - Administrator, Civil Service (Medical): No    Lack of Transportation (Non-Medical): No  Physical Activity: Sufficiently Active (09/11/2023)   Exercise Vital Sign    Days of Exercise per Week: 4 days    Minutes of Exercise per Session: 50 min  Stress: No Stress Concern Present (09/11/2023)   [employer] of Occupational Health - Occupational Stress Questionnaire    Feeling of Stress : Not at all  Social Connections: Moderately Integrated (  11/02/2023)    Social Connection and Isolation Panel    Frequency of Communication with Friends and Family: Three times a week    Frequency of Social Gatherings with Friends and Family: Three times a week    Attends Religious Services: More than 4 times per year    Active Member of Clubs or Organizations: No    Attends Banker Meetings: Never    Marital Status: Married    Objective:  BP 128/68   Pulse (!) 55   Temp 98.2 F (36.8 C)   Ht 5' 10 (1.778 m)   Wt 232 lb (105.2 kg)   SpO2 98%   BMI 33.29 kg/m      05/10/2024    9:53 AM 04/24/2024    9:55 AM 04/23/2024    9:50 AM  BP/Weight  Systolic BP 128 100 134  Diastolic BP 68 60 75  Wt. (Lbs) 232 233   BMI 33.29 kg/m2 33.43 kg/m2     Physical Exam Vitals reviewed.  Constitutional:      Appearance: Normal appearance. He is obese.  Neck:     Vascular: No carotid bruit.  Cardiovascular:     Rate and Rhythm: Normal rate and regular rhythm.     Heart sounds: Normal heart sounds.  Pulmonary:     Effort: Pulmonary effort is normal.     Breath sounds: Normal breath sounds. No wheezing, rhonchi or rales.  Abdominal:     General: Bowel sounds are normal.     Palpations: Abdomen is soft.     Tenderness: There is no abdominal tenderness.  Neurological:     Mental Status: He is alert and oriented to person, place, and time.  Psychiatric:        Mood and Affect: Mood normal.        Behavior: Behavior normal.         Lab Results  Component Value Date   WBC 5.0 11/13/2023   HGB 13.3 11/13/2023   HCT 39.6 11/13/2023   PLT 201 11/13/2023   GLUCOSE 88 12/05/2023   CHOL 136 11/02/2023   TRIG 101 11/02/2023   HDL 45 11/02/2023   LDLCALC 72 11/02/2023   ALT 18 12/05/2023   AST 19 12/05/2023   NA 140 12/05/2023   K 4.3 12/05/2023   CL 100 12/05/2023   CREATININE 1.57 (H) 12/05/2023   BUN 18 12/05/2023   CO2 24 12/05/2023   TSH 3.750 11/02/2023   INR 1.1 02/02/2022   HGBA1C 5.4 12/05/2023      Assessment &  Plan:  Hypothyroidism (acquired) Assessment & Plan: Well controlled Continue Synthroid  at current dose    Mixed hyperlipidemia Assessment & Plan: Well controlled.  No changes to medicines. Continue atorvastatin  40 mg before bed.  Continue to work on eating a healthy diet Encouraged exercise.    Primary insomnia Assessment & Plan: Continue ambien  10 mg before bed.    Impaired fasting glucose Assessment & Plan: Recommend continue to work on eating healthy diet and exercise.    Hypertensive heart disease with heart failure [facility]) Assessment & Plan: Well controlled.  No changes to medicines. Continue Amlodipine  5 mg M/W/F, Valsartan  320 mg daily. Torsemide  20 mg M, W, Friday Patient wears compression socks daily and takes Torsemide  20mg  daily. Continue to work on eating a healthy diet Management per specialist.  Cardiology  Orders: -     Dapagliflozin  Propanediol; Take 1 tablet (5 mg total) by mouth daily.  Dispense: 90 tablet; Refill: 1  Persistent atrial fibrillation (HCC) Assessment & Plan: Management per specialist. Taking Eliquis  5 mg twice a day, Amiodarone  200 mg daily.    CKD stage 3b, GFR 30-44 ml/min (HCC) Assessment & Plan: Stable.   Orders: -     Dapagliflozin  Propanediol; Take 1 tablet (5 mg total) by mouth daily.  Dispense: 90 tablet; Refill: 1  Class 1 obesity due to excess calories with serious comorbidity and body mass index (BMI) of 33.0 to 33.9 in adult Assessment & Plan: Recommend work on diet and encouraged exercise.       Meds ordered this encounter  Medications   dapagliflozin  propanediol (FARXIGA ) 5 MG TABS tablet    Sig: Take 1 tablet (5 mg total) by mouth daily.    Dispense:  90 tablet    Refill:  1    No orders of the defined types were placed in this encounter.    Follow-up: Return in about 4 months (around 09/09/2024).   An After Visit Summary was printed and given to the patient.  Abigail Free, MD [facility] [phone]

## 2024-05-18 ENCOUNTER — Encounter: Payer: Self-pay | Admitting: Family Medicine

## 2024-05-18 NOTE — Assessment & Plan Note (Signed)
 Recommend work on diet and encouraged exercise.

## 2024-05-18 NOTE — Assessment & Plan Note (Signed)
Continue ambien 10 mg before bed.  

## 2024-05-18 NOTE — Assessment & Plan Note (Signed)
Well controlled Continue Synthroid at current dose

## 2024-05-18 NOTE — Assessment & Plan Note (Signed)
 Well controlled.  No changes to medicines. Continue atorvastatin  40 mg before bed.  Continue to work on eating a healthy diet Encouraged exercise.

## 2024-05-18 NOTE — Assessment & Plan Note (Signed)
 Management per specialist. Taking Eliquis 5 mg twice a day, Amiodarone 200 mg daily.

## 2024-05-18 NOTE — Assessment & Plan Note (Signed)
 Stable

## 2024-05-18 NOTE — Assessment & Plan Note (Signed)
 Well controlled.  No changes to medicines. Continue Amlodipine  5 mg M/W/F, Valsartan  320 mg daily. Torsemide  20 mg M, W, Friday Patient wears compression socks daily and takes Torsemide  20mg  daily. Continue to work on eating a healthy diet Management per specialist.  Cardiology

## 2024-05-18 NOTE — Assessment & Plan Note (Signed)
 Recommend continue to work on eating healthy diet and exercise.

## 2024-05-31 ENCOUNTER — Other Ambulatory Visit: Payer: Self-pay | Admitting: Cardiology

## 2024-05-31 DIAGNOSIS — I5032 Chronic diastolic (congestive) heart failure: Secondary | ICD-10-CM

## 2024-06-04 ENCOUNTER — Telehealth: Payer: Self-pay

## 2024-06-04 ENCOUNTER — Other Ambulatory Visit: Payer: Self-pay

## 2024-06-04 DIAGNOSIS — N1832 Chronic kidney disease, stage 3b: Secondary | ICD-10-CM

## 2024-06-04 DIAGNOSIS — I110 Hypertensive heart disease with heart failure: Secondary | ICD-10-CM

## 2024-06-04 MED ORDER — DAPAGLIFLOZIN PROPANEDIOL 5 MG PO TABS: 5.0000 mg | ORAL_TABLET | Freq: Every day | ORAL | 1 refills | Status: AC

## 2024-06-04 NOTE — Telephone Encounter (Signed)
 Copied from CRM #[id number]. Topic: Clinical - Medication Question >> Jun 04, 2024  9:06 AM Kevelyn M wrote: Reason for CRM: Patient is running out of FARXIGA  5mg  and wants to know if Dr. Sherre would like know for him to continue taking this medication.  Call back# [phone]

## 2024-06-04 NOTE — Telephone Encounter (Signed)
 Patient aware to continue medication, resent rx to pharmacy.

## 2024-06-06 DIAGNOSIS — Z23 Encounter for immunization: Secondary | ICD-10-CM | POA: Diagnosis not present

## 2024-07-15 DIAGNOSIS — L821 Other seborrheic keratosis: Secondary | ICD-10-CM | POA: Diagnosis not present

## 2024-07-15 DIAGNOSIS — D485 Neoplasm of uncertain behavior of skin: Secondary | ICD-10-CM | POA: Diagnosis not present

## 2024-07-15 DIAGNOSIS — L570 Actinic keratosis: Secondary | ICD-10-CM | POA: Diagnosis not present

## 2024-07-15 DIAGNOSIS — L578 Other skin changes due to chronic exposure to nonionizing radiation: Secondary | ICD-10-CM | POA: Diagnosis not present

## 2024-07-15 DIAGNOSIS — L219 Seborrheic dermatitis, unspecified: Secondary | ICD-10-CM | POA: Diagnosis not present

## 2024-07-20 ENCOUNTER — Other Ambulatory Visit: Payer: Self-pay | Admitting: Family Medicine

## 2024-07-20 DIAGNOSIS — E538 Deficiency of other specified B group vitamins: Secondary | ICD-10-CM

## 2024-07-23 DIAGNOSIS — D0339 Melanoma in situ of other parts of face: Secondary | ICD-10-CM | POA: Diagnosis not present

## 2024-07-26 ENCOUNTER — Other Ambulatory Visit: Payer: Self-pay | Admitting: Family Medicine

## 2024-07-26 DIAGNOSIS — E782 Mixed hyperlipidemia: Secondary | ICD-10-CM

## 2024-07-29 ENCOUNTER — Encounter: Payer: Self-pay | Admitting: Family Medicine

## 2024-07-29 ENCOUNTER — Other Ambulatory Visit: Payer: Self-pay | Admitting: Family Medicine

## 2024-07-29 ENCOUNTER — Ambulatory Visit: Payer: Self-pay | Admitting: *Deleted

## 2024-07-29 ENCOUNTER — Ambulatory Visit: Admitting: Family Medicine

## 2024-07-29 VITALS — BP 130/70 | HR 49 | Temp 98.0°F | Ht 70.0 in | Wt 228.0 lb

## 2024-07-29 DIAGNOSIS — J208 Acute bronchitis due to other specified organisms: Secondary | ICD-10-CM

## 2024-07-29 DIAGNOSIS — C434 Malignant melanoma of scalp and neck: Secondary | ICD-10-CM | POA: Diagnosis not present

## 2024-07-29 LAB — POC COVID19 BINAXNOW: SARS Coronavirus 2 Ag: NEGATIVE

## 2024-07-29 MED ORDER — AMOXICILLIN 875 MG PO TABS: 875.0000 mg | ORAL_TABLET | Freq: Two times a day (BID) | ORAL | 0 refills | Status: AC

## 2024-07-29 MED ORDER — HYDROCODONE BIT-HOMATROP MBR 5-1.5 MG/5ML PO SOLN: 5.0000 mL | Freq: Four times a day (QID) | ORAL | 0 refills | Status: DC | PRN

## 2024-07-29 NOTE — Progress Notes (Unsigned)
 Acute Office Visit  Subjective:    Patient ID: John Kemp, male    DOB: 1946-04-12, 78 y.o.   MRN: [id number]  Chief Complaint  Patient presents with   Cough    Since Friday was greenish in color, now clear. Cough is worse at night     Discussed the use of AI scribe software for clinical note transcription with the patient, who gave verbal consent to proceed.  History of Present Illness John Kemp is a 78 year old male who presents with persistent cough and sinus congestion.  Upper respiratory symptoms - Persistent cough and sinus congestion for the past 3-4 days, onset Friday - Cough is worse at night - Slight sore throat without significant pain - No fever, shortness of breath, chills, sweats, earaches, or significant sore throat - No achiness  Lower extremity edema - Minor swelling in legs attributed to socks - No significant swelling  Medication and treatment history - No medications taken for current cough - No home COVID testing performed - Previous prescription of hydrocodone -containing cough syrup was effective - Tessalon Perles previously tried without benefit  Recent dermatologic procedure - Recent procedure for melanoma on head performed last Tuesday - Awaiting results to confirm complete removal of melanoma - Stitches scheduled for removal on December 12th    Past Medical History:  Diagnosis Date   A-fib [facility])    Acute bronchitis due to other specified organisms 10/28/2021   Anticoagulant long-term use 10/11/2022   Atherosclerotic heart disease of native coronary artery without angina pectoris    Atrial fibrillation with rapid ventricular response (HCC) 11/12/2021   Atrial flutter with rapid ventricular response (HCC) 11/12/2021   B12 deficiency 06/03/2022   Benign prostatic hyperplasia with urinary frequency 12/24/2021   Bilateral carotid artery stenosis 02/20/2022   CAD in native artery 01/21/2015   Cancer (HCC)    skin cancer    Carotid artery occlusion    CHF (congestive heart failure) (HCC) 11/05/2019   Chronic diastolic (congestive) heart failure (HCC)    Chronic obstructive pulmonary disease (COPD) (HCC)    CKD stage 3b, GFR 30-44 ml/min (HCC) 12/11/2023   Class 1 obesity due to excess calories with serious comorbidity and body mass index (BMI) of 32.0 to 32.9 in adult    Coronary artery disease involving autologous artery coronary bypass graft 11/12/2021   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 11/17/2019   Dysequilibrium 11/12/2021   Dyslipidemia 11/12/2021   Elevated brain natriuretic peptide (BNP) level 11/12/2021   Encounter for immunization 08/23/2022   Essential hypertension 01/21/2015   right superior quadrantanopsia   Hearing loss of right ear due to cerumen impaction 10/20/2021   High cholesterol 10/20/2017   Hx of CABG 03/02/2017   Hypertension    Hypertensive heart and kidney disease with chronic diastolic congestive heart failure and stage 3 chronic kidney disease, unspecified whether stage 3a or 3b CKD (HCC) 07/24/2023   Hypertensive heart disease with heart failure (HCC)    Hypothyroidism (acquired) 04/24/2018   Impaired fasting glucose    Left rotator cuff tear 11/12/2021   Mixed hyperlipidemia 11/17/2019   Myocardial infarction [facility])    Nasal septal deviation 10/11/2022   Nasal turbinate hypertrophy 10/11/2022   Neck pain 02/20/2022   Neural foraminal stenosis of cervical spine 02/28/2022   Non-seasonal allergic rhinitis due to pollen 10/20/2021   On amiodarone  therapy 01/21/2015   PAF (paroxysmal atrial fibrillation) (HCC)    Pedal edema 07/05/2022   Persistent atrial fibrillation (  HCC) 11/12/2021   Physical deconditioning 12/07/2021   Primary insomnia    Seasonal allergies 11/13/2023   Sensorineural hearing loss (SNHL) of right ear with unrestricted hearing of left ear 05/26/2022   Sequela of cardioembolic stroke 11/17/2019   Severe obesity with body mass index  (BMI) of 36.0 to 36.9 with serious comorbidity (HCC)    Squamous cell carcinoma 11/05/2019   Subjective tinnitus of right ear 05/26/2022   Vitamin D  deficiency, unspecified     Past Surgical History:  Procedure Laterality Date   APPENDECTOMY  1956   CARDIAC CATHETERIZATION     CARDIOVERSION  2002   CHOLECYSTECTOMY  2006   CORONARY ARTERY BYPASS GRAFT  2001   HERNIA REPAIR     RADIOLOGY WITH ANESTHESIA Bilateral 01/18/2018   Procedure: MRI WITH ANESTHESIA LUMBAR SPINE WITH AND WITHOUT CONTRAST;  Surgeon: Radiologist, Medication, MD;  Location: [facility] OR;  Service: Radiology;  Laterality: Bilateral;   RADIOLOGY WITH ANESTHESIA N/A 04/19/2022   Procedure: MRI WITH BRAIN WITH AND WITHOUT CONTRAST;  Surgeon: Radiologist, Medication, MD;  Location: [facility] OR;  Service: Radiology;  Laterality: N/A;   ROTATOR CUFF REPAIR Left    TONSILLECTOMY  1975    Family History  Problem Relation Age of Onset   Heart disease Father    Arrhythmia Father    Breast cancer Mother    Diabetes type II Sister    Diabetes Brother     Social History   Socioeconomic History   Marital status: Married    Spouse name: Devere   Number of children: 2   Years of education: Not on file   Highest education level: Not on file  Occupational History   Occupation: Retired  Tobacco Use   Smoking status: Former    Current packs/day: 0.00    Average packs/day: 1 pack/day for 15.0 years (15.0 ttl pk-yrs)    Types: Cigarettes    Start date: 08/30/1963    Quit date: 08/29/1978    Years since quitting: 45.9    Passive exposure: Past   Smokeless tobacco: Never  Vaping Use   Vaping status: Never Used  Substance and Sexual Activity   Alcohol use: No    Alcohol/week: 0.0 standard drinks of alcohol   Drug use: No   Sexual activity: Not Currently  Other Topics Concern   Not on file  Social History Narrative   Not on file   Social Drivers of Health   Financial Resource Strain: Low Risk  (09/11/2023)   Overall Financial  Resource Strain (CARDIA)    Difficulty of Paying Living Expenses: Not very hard  Food Insecurity: No Food Insecurity (09/11/2023)   Hunger Vital Sign    Worried About Running Out of Food in the Last Year: Never true    Ran Out of Food in the Last Year: Never true  Transportation Needs: No Transportation Needs (09/11/2023)   PRAPARE - Administrator, Civil Service (Medical): No    Lack of Transportation (Non-Medical): No  Physical Activity: Sufficiently Active (09/11/2023)   Exercise Vital Sign    Days of Exercise per Week: 4 days    Minutes of Exercise per Session: 50 min  Stress: No Stress Concern Present (09/11/2023)   [employer] of Occupational Health - Occupational Stress Questionnaire    Feeling of Stress : Not at all  Social Connections: Moderately Integrated (11/02/2023)   Social Connection and Isolation Panel    Frequency of Communication with Friends and Family: Three times a week  Frequency of Social Gatherings with Friends and Family: Three times a week    Attends Religious Services: More than 4 times per year    Active Member of Clubs or Organizations: No    Attends Banker Meetings: Never    Marital Status: Married  Catering Manager Violence: Not At Risk (09/11/2023)   Humiliation, Afraid, Rape, and Kick questionnaire    Fear of Current or Ex-Partner: No    Emotionally Abused: No    Physically Abused: No    Sexually Abused: No    Outpatient Medications Prior to Visit  Medication Sig Dispense Refill   acetaminophen  (TYLENOL ) 500 MG tablet Take 500 mg by mouth 2 (two) times daily.     amiodarone  (PACERONE ) 200 MG tablet TAKE 1 TABLET BY MOUTH EVERY DAY 90 tablet 3   amLODipine  (NORVASC ) 5 MG tablet Take 1 tablet (5 mg total) by mouth every Monday, Wednesday, and Friday. 45 tablet 3   atorvastatin  (LIPITOR) 40 MG tablet TAKE 1 TABLET BY MOUTH EVERY DAY 90 tablet 0   azelastine  (ASTELIN ) 0.1 % nasal spray Place 1 spray into both nostrils 2  (two) times daily. Use in each nostril as directed 30 mL 12   cyanocobalamin  (VITAMIN B12) 1000 MCG/ML injection INJECT 1 ML (1,000 MCG) INTRAMUSCULARLY EVERY 30 DAYS 3 mL 1   dapagliflozin  propanediol (FARXIGA ) 5 MG TABS tablet Take 1 tablet (5 mg total) by mouth daily. 90 tablet 1   ELIQUIS  5 MG TABS tablet TAKE 1 TABLET BY MOUTH TWICE A DAY 60 tablet 5   finasteride  (PROSCAR ) 5 MG tablet Take 1 tablet (5 mg total) by mouth daily. 90 tablet 1   Inulin (FIBER CHOICE PO) Take 3 tablets by mouth daily as needed (Constipation).     ketoconazole (NIZORAL) 2 % shampoo Apply 1 Application topically 3 (three) times a week.     levothyroxine  (SYNTHROID ) 112 MCG tablet TAKE 1 TABLET BY MOUTH EVERY DAY 90 tablet 3   montelukast  (SINGULAIR ) 10 MG tablet TAKE 1 TABLET BY MOUTH EVERYDAY AT BEDTIME 90 tablet 1   nitroGLYCERIN  (NITROSTAT ) 0.4 MG SL tablet Place 1 tablet (0.4 mg total) under the tongue every 5 (five) minutes as needed for chest pain. 25 tablet 2   nystatin cream (MYCOSTATIN) Apply 1 Application topically 2 (two) times daily.     SYRINGE-NEEDLE, DISP, 3 ML (BD ECLIPSE SYRINGE/NEEDLE) 25G X 5/8 3 ML MISC Inject 1 ml of B12 every week for 4 weeks 50 each 0   tamsulosin  (FLOMAX ) 0.4 MG CAPS capsule TAKE 1 CAPSULE BY MOUTH EVERY DAY 90 capsule 1   torsemide  (DEMADEX ) 20 MG tablet TAKE 1 TABLET BY MOUTH EVERY DAY 90 tablet 1   triamcinolone  cream (KENALOG ) 0.1 % Apply 1 Application topically 2 (two) times daily.     valsartan  (DIOVAN ) 320 MG tablet TAKE 1 TABLET BY MOUTH EVERY DAY 90 tablet 1   Vitamin D , Ergocalciferol , (DRISDOL ) 1.25 MG (50000 UNIT) CAPS capsule TAKE 1 CAPSULE (50,000 UNITS TOTAL) BY MOUTH EVERY 7 (SEVEN) DAYS 12 capsule 2   zolpidem  (AMBIEN ) 10 MG tablet TAKE ONE TABLET BY MOUTH AT BEDTIME 90 tablet 1   No facility-administered medications prior to visit.    Allergies  Allergen Reactions   Pravastatin  Other (See Comments)    Other Reaction(s): Other (See  Comments)  myalgias, myalgia  Other Reaction(s): Other (See Comments)    myalgias, myalgia    myalgias myalgia    myalgia    Other Reaction(s): Other (  See Comments)  myalgias, myalgia    myalgias   Prednisone  Other (See Comments) and Anxiety    makes me feel terrible  Other Reaction(s): Angioedema, Other (See Comments)  Pt feels out of regular routine, makes me feel terrible  Other Reaction(s): Angioedema, Other (See Comments)    Pt feels out of regular routine, makes me feel terrible    Pt feels out of regular routine    Pt feels out of regular routine makes me feel terrible    makes me feel terrible  Other Reaction(s): Angioedema, Other (See Comments)  Pt feels out of regular routine, makes me feel terrible   Ozempic  (0.25 Or 0.5 Mg-Dose) [Semaglutide (0.25 Or 0.5mg -Dos)] Nausea Only    nausea   Diltiazem Hcl Nausea And Vomiting and Other (See Comments)   Ezetimibe Other (See Comments)    myalgias  Other Reaction(s): Other (See Comments)    myalgias   Kerendia  [Finerenone ]     Chest pain   Moxifloxacin Hcl Other (See Comments)    Unknown  Other Reaction(s): Other (See Comments)    Pt reports unknown reaction    Unknown   Pravachol  [Pravastatin  Sodium] Other (See Comments)    myalgia    Review of Systems     Objective:        07/29/2024    1:51 PM 05/10/2024    9:53 AM 04/24/2024    9:55 AM  Vitals with BMI  Height 5' 10 5' 10 5' 10  Weight 228 lbs 232 lbs 233 lbs  BMI 32.71 33.29 33.43  Systolic 130 128 899  Diastolic 70 68 60  Pulse 49 55 48    No data found.   Physical Exam  Health Maintenance Due  Topic Date Due   Zoster Vaccines- Shingrix (1 of 2) Never done   COVID-19 Vaccine (5 - 2025-26 season) 04/29/2024   DTaP/Tdap/Td (2 - Td or Tdap) 04/30/2024   Medicare Annual Wellness (AWV)  09/10/2024    There are no preventive care reminders to display for this patient.   Lab Results  Component Value Date   TSH  3.750 11/02/2023   Lab Results  Component Value Date   WBC 5.0 11/13/2023   HGB 13.3 11/13/2023   HCT 39.6 11/13/2023   MCV 94 11/13/2023   PLT 201 11/13/2023   Lab Results  Component Value Date   NA 140 12/05/2023   K 4.3 12/05/2023   CO2 24 12/05/2023   GLUCOSE 88 12/05/2023   BUN 18 12/05/2023   CREATININE 1.57 (H) 12/05/2023   BILITOT 0.8 12/05/2023   ALKPHOS 103 12/05/2023   AST 19 12/05/2023   ALT 18 12/05/2023   PROT 6.7 12/05/2023   ALBUMIN 4.3 12/05/2023   CALCIUM  9.3 12/05/2023   ANIONGAP 10 04/19/2022   EGFR 44.0 03/14/2024   Lab Results  Component Value Date   CHOL 136 11/02/2023   Lab Results  Component Value Date   HDL 45 11/02/2023   Lab Results  Component Value Date   LDLCALC 72 11/02/2023   Lab Results  Component Value Date   TRIG 101 11/02/2023   Lab Results  Component Value Date   CHOLHDL 3.0 11/02/2023   Lab Results  Component Value Date   HGBA1C 5.4 12/05/2023        Results for orders placed or performed in visit on 07/29/24  POC COVID-19 BinaxNow   Collection Time: 07/29/24  2:46 PM  Result Value Ref Range   SARS Coronavirus 2 Ag Negative  Negative     Assessment & Plan:   Assessment & Plan Acute bronchitis due to other specified organisms Cough, sinus congestion, and sinus pain for four days. Acute sinusitis. - Performed COVID-19 test. - If COVID-19 test is negative, prescribe antibiotic for sinus infection. - Recommended Tessalon Perles for cough management. Orders:   POC COVID-19 BinaxNow  Malignant melanoma of skin of scalp (HCC) Post-excision care for malignant melanoma on the scalp. Surgery performed on Tuesday. Awaiting call regarding complete excision. - Await call regarding complete excision of melanoma. - Plan for stitch removal on the 12th.     Body mass index is 32.71 kg/m..   Meds ordered this encounter  Medications   HYDROcodone  bit-homatropine (HYDROMET) 5-1.5 MG/5ML syrup    Sig: Take 5 mLs by  mouth every 6 (six) hours as needed for cough.    Dispense:  120 mL    Refill:  0    J20.8 ACUTE BRONCHITIS   amoxicillin  (AMOXIL ) 875 MG tablet    Sig: Take 1 tablet (875 mg total) by mouth 2 (two) times daily for 10 days.    Dispense:  20 tablet    Refill:  0    Orders Placed This Encounter  Procedures   POC COVID-19 BinaxNow     I,Marla I Leal-Borjas,acting as a scribe for Abigail Free, MD.,have documented all relevant documentation on the behalf of Abigail Free, MD,as directed by  Abigail Free, MD while in the presence of Abigail Free, MD.   Follow-up: No follow-ups on file.  An After Visit Summary was printed and given to the patient.  Abigail Free, MD [facility] [phone]

## 2024-07-29 NOTE — Telephone Encounter (Signed)
  FYI Only or Action Required?: FYI only for provider: appointment scheduled on 07/29/24.  Patient was last seen in primary care on 05/10/2024 by Sherre Clapper, MD.  Called Nurse Triage reporting Cough. Chest discomfort, runny nose, headache.  Symptoms began several days ago. Friday   Interventions attempted: Rest, hydration, or home remedies.  Symptoms are: gradually worsening.  Triage Disposition: See Physician Within 24 Hours  Patient/caregiver understands and will follow disposition?: Yes                Copied from CRM #[id number]. Topic: Clinical - Red Word Triage >> Jul 29, 2024  7:38 AM [place] MATSU wrote: Red Word that prompted transfer to Nurse Triage: chest discomfort, cough, headache, upper resp. Symptoms. Reason for Disposition  [1] Known COPD or other severe lung disease (i.e., bronchiectasis, cystic fibrosis, lung surgery) AND [2] symptoms getting worse (i.e., increased sputum purulence or amount, increased breathing difficulty    No severe lung issues hx heart issues and chest discomfort coughing.  Answer Assessment - Initial Assessment Questions Appt scheduled today     1. ONSET: When did the cough begin?      Friday  2. SEVERITY: How bad is the cough today?      Worsening with chest discomfort with coughing  3. SPUTUM: Describe the color of your sputum (e.g., none, dry cough; clear, white, yellow, green)     Green  4. HEMOPTYSIS: Are you coughing up any blood? If Yes, ask: How much? (e.g., flecks, streaks, tablespoons, etc.)     No  5. DIFFICULTY BREATHING: Are you having difficulty breathing? If Yes, ask: How bad is it? (e.g., mild, moderate, severe)      No  6. FEVER: Do you have a fever? If Yes, ask: What is your temperature, how was it measured, and when did it start?     No  7. CARDIAC HISTORY: Do you have any history of heart disease? (e.g., heart attack, congestive heart failure)      Hx heart issues 8. LUNG HISTORY: Do you  have any history of lung disease?  (e.g., pulmonary embolus, asthma, emphysema)     No  9. PE RISK FACTORS: Do you have a history of blood clots? (or: recent major surgery, recent prolonged travel, bedridden)     na 10. OTHER SYMPTOMS: Do you have any other symptoms? (e.g., runny nose, wheezing, chest pain)       Headache , coughing green sputum noted. Chest discomfort coughing, runny nose blows nose and blood noted at times. Denies chest pain no sweating no dizziness not difficulty breathing. No fever. 11. PREGNANCY: Is there any chance you are pregnant? When was your last menstrual period?       na 12. TRAVEL: Have you traveled out of the country in the last month? (e.g., travel history, exposures)       na  Protocols used: Cough - Acute Productive-A-AH

## 2024-08-02 DIAGNOSIS — C434 Malignant melanoma of scalp and neck: Secondary | ICD-10-CM | POA: Insufficient documentation

## 2024-08-02 NOTE — Assessment & Plan Note (Addendum)
 Cough, sinus congestion, and sinus pain for four days. Acute sinusitis. - Performed COVID-19 test = negative - Prescribed amoxicillin  and hydromet cough syrup.  - Recommended Tessalon Perles for cough management. Orders:   POC COVID-19 BinaxNow

## 2024-08-02 NOTE — Assessment & Plan Note (Signed)
 Post-excision care for malignant melanoma on the scalp. Surgery performed on Tuesday. Awaiting call regarding complete excision. - Await call regarding complete excision of melanoma. - Plan for stitch removal on the 12th.

## 2024-08-08 ENCOUNTER — Other Ambulatory Visit: Payer: Self-pay | Admitting: Family Medicine

## 2024-08-08 DIAGNOSIS — J302 Other seasonal allergic rhinitis: Secondary | ICD-10-CM

## 2024-08-08 DIAGNOSIS — N401 Enlarged prostate with lower urinary tract symptoms: Secondary | ICD-10-CM

## 2024-08-08 DIAGNOSIS — E782 Mixed hyperlipidemia: Secondary | ICD-10-CM

## 2024-08-14 ENCOUNTER — Encounter: Payer: Self-pay | Admitting: Family Medicine

## 2024-08-14 ENCOUNTER — Ambulatory Visit (INDEPENDENT_AMBULATORY_CARE_PROVIDER_SITE_OTHER): Admitting: Family Medicine

## 2024-08-14 VITALS — BP 128/74 | HR 65 | Temp 98.2°F | Ht 70.0 in | Wt 229.0 lb

## 2024-08-14 DIAGNOSIS — J208 Acute bronchitis due to other specified organisms: Secondary | ICD-10-CM

## 2024-08-14 MED ORDER — TRIAMCINOLONE ACETONIDE 40 MG/ML IJ SUSP
80.0000 mg | Freq: Once | INTRAMUSCULAR | Status: AC
  Administered 2024-08-14: 11:00:00 80 mg via INTRAMUSCULAR

## 2024-08-14 MED ORDER — AMOXICILLIN-POT CLAVULANATE 875-125 MG PO TABS: 1.0000 | ORAL_TABLET | Freq: Two times a day (BID) | ORAL | 0 refills | Status: DC

## 2024-08-14 NOTE — Assessment & Plan Note (Signed)
 ACUTE UPPER RESPIRATORY INFECTION: You have a recurrent cough and congestion without fever or wheezing. -Recommend nasal saline spray.  -You have been prescribed a different antibiotic to help with your symptoms. AUGMENTIN  SENT -For cough relief, you can use over-the-counter Corecidin.  -You were given a kenalog  shot in the office.   Orders:   triamcinolone  acetonide (KENALOG -40) injection 80 mg   amoxicillin -clavulanate (AUGMENTIN ) 875-125 MG tablet; Take 1 tablet by mouth 2 (two) times daily.

## 2024-08-14 NOTE — Progress Notes (Signed)
 Acute Office Visit  Subjective:    Patient ID: John Kemp, male    DOB: Jun 13, 1946, 78 y.o.   MRN: [id number]  Chief Complaint  Patient presents with   Cough    Congestion feels like he has never gotten over his symptoms from a few weeks ago.     Discussed the use of AI scribe software for clinical note transcription with the patient, who gave verbal consent to proceed.  History of Present Illness John Kemp is a 78 year old male who presents with persistent cough and nasal congestion.  Cough - Persistent since Monday - Coughs up a small amount of sputum - Tickle in the back of the throat triggers coughing, especially at night - No associated fevers, chills, or sweats  Nasal congestion - Persistent nasal congestion since Monday - Sensation of being 'stopped up in my head' - Initial slight improvement, followed by worsening of symptoms - No headaches, earaches, or sore throat  Medication use and adverse effects - Completed a course of amoxicillin  with no improvement in symptoms - Uses azelastine  nasal spray, suspects it may have caused sores in the nose - No prior use of chloraseptic - Kenalog  injection has provided relief for similar symptoms in the past    Past Medical History:  Diagnosis Date   A-fib (HCC)    Acute bronchitis due to other specified organisms 10/28/2021   Anticoagulant long-term use 10/11/2022   Atherosclerotic heart disease of native coronary artery without angina pectoris    Atrial fibrillation with rapid ventricular response (HCC) 11/12/2021   Atrial flutter with rapid ventricular response (HCC) 11/12/2021   B12 deficiency 06/03/2022   Benign prostatic hyperplasia with urinary frequency 12/24/2021   Bilateral carotid artery stenosis 02/20/2022   CAD in native artery 01/21/2015   Cancer [facility])    skin cancer   Carotid artery occlusion    CHF (congestive heart failure) (HCC) 11/05/2019   Chronic diastolic (congestive) heart  failure (HCC)    Chronic obstructive pulmonary disease (COPD) (HCC)    CKD stage 3b, GFR 30-44 ml/min (HCC) 12/11/2023   Class 1 obesity due to excess calories with serious comorbidity and body mass index (BMI) of 32.0 to 32.9 in adult    Coronary artery disease involving autologous artery coronary bypass graft 11/12/2021   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 11/17/2019   Dysequilibrium 11/12/2021   Dyslipidemia 11/12/2021   Elevated brain natriuretic peptide (BNP) level 11/12/2021   Encounter for immunization 08/23/2022   Essential hypertension 01/21/2015   right superior quadrantanopsia   Hearing loss of right ear due to cerumen impaction 10/20/2021   High cholesterol 10/20/2017   Hx of CABG 03/02/2017   Hypertension    Hypertensive heart and kidney disease with chronic diastolic congestive heart failure and stage 3 chronic kidney disease, unspecified whether stage 3a or 3b CKD (HCC) 07/24/2023   Hypertensive heart disease with heart failure (HCC)    Hypothyroidism (acquired) 04/24/2018   Impaired fasting glucose    Left rotator cuff tear 11/12/2021   Mixed hyperlipidemia 11/17/2019   Myocardial infarction [facility])    Nasal septal deviation 10/11/2022   Nasal turbinate hypertrophy 10/11/2022   Neck pain 02/20/2022   Neural foraminal stenosis of cervical spine 02/28/2022   Non-seasonal allergic rhinitis due to pollen 10/20/2021   On amiodarone  therapy 01/21/2015   PAF (paroxysmal atrial fibrillation) (HCC)    Pedal edema 07/05/2022   Persistent atrial fibrillation (HCC) 11/12/2021   Physical deconditioning 12/07/2021  Primary insomnia    Seasonal allergies 11/13/2023   Sensorineural hearing loss (SNHL) of right ear with unrestricted hearing of left ear 05/26/2022   Sequela of cardioembolic stroke 11/17/2019   Severe obesity with body mass index (BMI) of 36.0 to 36.9 with serious comorbidity (HCC)    Squamous cell carcinoma 11/05/2019   Subjective tinnitus of  right ear 05/26/2022   Vitamin D  deficiency, unspecified     Past Surgical History:  Procedure Laterality Date   APPENDECTOMY  1956   CARDIAC CATHETERIZATION     CARDIOVERSION  2002   CHOLECYSTECTOMY  2006   CORONARY ARTERY BYPASS GRAFT  2001   HERNIA REPAIR     RADIOLOGY WITH ANESTHESIA Bilateral 01/18/2018   Procedure: MRI WITH ANESTHESIA LUMBAR SPINE WITH AND WITHOUT CONTRAST;  Surgeon: Radiologist, Medication, MD;  Location: [facility] OR;  Service: Radiology;  Laterality: Bilateral;   RADIOLOGY WITH ANESTHESIA N/A 04/19/2022   Procedure: MRI WITH BRAIN WITH AND WITHOUT CONTRAST;  Surgeon: Radiologist, Medication, MD;  Location: [facility] OR;  Service: Radiology;  Laterality: N/A;   ROTATOR CUFF REPAIR Left    TONSILLECTOMY  1975    Family History  Problem Relation Age of Onset   Heart disease Father    Arrhythmia Father    Breast cancer Mother    Diabetes type II Sister    Diabetes Brother     Social History   Socioeconomic History   Marital status: Married    Spouse name: Devere   Number of children: 2   Years of education: Not on file   Highest education level: Not on file  Occupational History   Occupation: Retired  Tobacco Use   Smoking status: Former    Current packs/day: 0.00    Average packs/day: 1 pack/day for 15.0 years (15.0 ttl pk-yrs)    Types: Cigarettes    Start date: 08/30/1963    Quit date: 08/29/1978    Years since quitting: 45.9    Passive exposure: Past   Smokeless tobacco: Never  Vaping Use   Vaping status: Never Used  Substance and Sexual Activity   Alcohol use: No    Alcohol/week: 0.0 standard drinks of alcohol   Drug use: No   Sexual activity: Not Currently  Other Topics Concern   Not on file  Social History Narrative   Not on file   Social Drivers of Health   Tobacco Use: Medium Risk (07/29/2024)   Patient History    Smoking Tobacco Use: Former    Smokeless Tobacco Use: Never    Passive Exposure: Past  Physicist, Medical Strain: Low Risk  (09/11/2023)   Overall Financial Resource Strain (CARDIA)    Difficulty of Paying Living Expenses: Not very hard  Food Insecurity: No Food Insecurity (09/11/2023)   Hunger Vital Sign    Worried About Running Out of Food in the Last Year: Never true    Ran Out of Food in the Last Year: Never true  Transportation Needs: No Transportation Needs (09/11/2023)   PRAPARE - Administrator, Civil Service (Medical): No    Lack of Transportation (Non-Medical): No  Physical Activity: Sufficiently Active (09/11/2023)   Exercise Vital Sign    Days of Exercise per Week: 4 days    Minutes of Exercise per Session: 50 min  Stress: No Stress Concern Present (09/11/2023)   [employer] of Occupational Health - Occupational Stress Questionnaire    Feeling of Stress : Not at all  Social Connections: Moderately Integrated (11/02/2023)  Social Connection and Isolation Panel    Frequency of Communication with Friends and Family: Three times a week    Frequency of Social Gatherings with Friends and Family: Three times a week    Attends Religious Services: More than 4 times per year    Active Member of Clubs or Organizations: No    Attends Banker Meetings: Never    Marital Status: Married  Catering Manager Violence: Not At Risk (09/11/2023)   Humiliation, Afraid, Rape, and Kick questionnaire    Fear of Current or Ex-Partner: No    Emotionally Abused: No    Physically Abused: No    Sexually Abused: No  Depression (PHQ2-9): Low Risk (02/05/2024)   Depression (PHQ2-9)    PHQ-2 Score: 0  Alcohol Screen: Low Risk (09/11/2023)   Alcohol Screen    Last Alcohol Screening Score (AUDIT): 0  Housing: Low Risk (09/11/2023)   Housing Stability Vital Sign    Unable to Pay for Housing in the Last Year: No    Number of Times Moved in the Last Year: 0    Homeless in the Last Year: No  Utilities: Not At Risk (09/11/2023)   AHC Utilities    Threatened with loss of utilities: No  Health Literacy:  Adequate Health Literacy (11/02/2023)   B1300 Health Literacy    Frequency of need for help with medical instructions: Never    Outpatient Medications Prior to Visit  Medication Sig Dispense Refill   acetaminophen  (TYLENOL ) 500 MG tablet Take 500 mg by mouth 2 (two) times daily.     amiodarone  (PACERONE ) 200 MG tablet TAKE 1 TABLET BY MOUTH EVERY DAY 90 tablet 3   amLODipine  (NORVASC ) 5 MG tablet Take 1 tablet (5 mg total) by mouth every Monday, Wednesday, and Friday. 45 tablet 3   atorvastatin  (LIPITOR) 40 MG tablet TAKE 1 TABLET BY MOUTH EVERY DAY 90 tablet 0   azelastine  (ASTELIN ) 0.1 % nasal spray Place 1 spray into both nostrils 2 (two) times daily. Use in each nostril as directed 30 mL 12   cyanocobalamin  (VITAMIN B12) 1000 MCG/ML injection INJECT 1 ML (1,000 MCG) INTRAMUSCULARLY EVERY 30 DAYS 3 mL 1   dapagliflozin  propanediol (FARXIGA ) 5 MG TABS tablet Take 1 tablet (5 mg total) by mouth daily. 90 tablet 1   ELIQUIS  5 MG TABS tablet TAKE 1 TABLET BY MOUTH TWICE A DAY 60 tablet 5   finasteride  (PROSCAR ) 5 MG tablet Take 1 tablet (5 mg total) by mouth daily. 90 tablet 1   HYDROcodone  bit-homatropine (HYDROMET) 5-1.5 MG/5ML syrup Take 5 mLs by mouth every 6 (six) hours as needed for cough. 120 mL 0   Inulin (FIBER CHOICE PO) Take 3 tablets by mouth daily as needed (Constipation).     ketoconazole (NIZORAL) 2 % shampoo Apply 1 Application topically 3 (three) times a week.     levothyroxine  (SYNTHROID ) 112 MCG tablet TAKE 1 TABLET BY MOUTH EVERY DAY 90 tablet 3   montelukast  (SINGULAIR ) 10 MG tablet TAKE 1 TABLET BY MOUTH EVERYDAY AT BEDTIME 90 tablet 1   nitroGLYCERIN  (NITROSTAT ) 0.4 MG SL tablet Place 1 tablet (0.4 mg total) under the tongue every 5 (five) minutes as needed for chest pain. 25 tablet 2   nystatin cream (MYCOSTATIN) Apply 1 Application topically 2 (two) times daily.     SYRINGE-NEEDLE, DISP, 3 ML (BD ECLIPSE SYRINGE/NEEDLE) 25G X 5/8 3 ML MISC Inject 1 ml of B12 every week  for 4 weeks 50 each 0  tamsulosin  (FLOMAX ) 0.4 MG CAPS capsule TAKE 1 CAPSULE BY MOUTH EVERY DAY 90 capsule 1   torsemide  (DEMADEX ) 20 MG tablet TAKE 1 TABLET BY MOUTH EVERY DAY 90 tablet 1   triamcinolone  cream (KENALOG ) 0.1 % Apply 1 Application topically 2 (two) times daily.     valsartan  (DIOVAN ) 320 MG tablet TAKE 1 TABLET BY MOUTH EVERY DAY 90 tablet 1   Vitamin D , Ergocalciferol , (DRISDOL ) 1.25 MG (50000 UNIT) CAPS capsule TAKE 1 CAPSULE (50,000 UNITS TOTAL) BY MOUTH EVERY 7 (SEVEN) DAYS 12 capsule 2   zolpidem  (AMBIEN ) 10 MG tablet TAKE ONE TABLET BY MOUTH AT BEDTIME 90 tablet 1   No facility-administered medications prior to visit.    Allergies[1]  Review of Systems     Objective:        08/14/2024    9:57 AM 07/29/2024    1:51 PM 05/10/2024    9:53 AM  Vitals with BMI  Height 5' 10 5' 10 5' 10  Weight 229 lbs 228 lbs 232 lbs  BMI 32.86 32.71 33.29  Systolic 128 130 871  Diastolic 74 70 68  Pulse 65 49 55    No data found.   Physical Exam Constitutional:      Appearance: Normal appearance.  HENT:     Right Ear: Tympanic membrane, ear canal and external ear normal.     Left Ear: Tympanic membrane, ear canal and external ear normal.     Nose: Congestion present. No rhinorrhea.     Mouth/Throat:     Mouth: Mucous membranes are moist.     Pharynx: No oropharyngeal exudate or posterior oropharyngeal erythema.  Cardiovascular:     Rate and Rhythm: Normal rate and regular rhythm.     Heart sounds: Normal heart sounds.  Pulmonary:     Effort: Pulmonary effort is normal. No respiratory distress.     Breath sounds: Normal breath sounds. No wheezing, rhonchi or rales.  Lymphadenopathy:     Cervical: No cervical adenopathy.  Neurological:     Mental Status: He is alert.  Psychiatric:        Mood and Affect: Mood normal.        Behavior: Behavior normal.     Health Maintenance Due  Topic Date Due   Zoster Vaccines- Shingrix (1 of 2) Never done   COVID-19  Vaccine (5 - 2025-26 season) 04/29/2024   DTaP/Tdap/Td (2 - Td or Tdap) 04/30/2024   Medicare Annual Wellness (AWV)  09/10/2024    There are no preventive care reminders to display for this patient.   Lab Results  Component Value Date   TSH 3.750 11/02/2023   Lab Results  Component Value Date   WBC 5.0 11/13/2023   HGB 13.3 11/13/2023   HCT 39.6 11/13/2023   MCV 94 11/13/2023   PLT 201 11/13/2023   Lab Results  Component Value Date   NA 140 12/05/2023   K 4.3 12/05/2023   CO2 24 12/05/2023   GLUCOSE 88 12/05/2023   BUN 18 12/05/2023   CREATININE 1.57 (H) 12/05/2023   BILITOT 0.8 12/05/2023   ALKPHOS 103 12/05/2023   AST 19 12/05/2023   ALT 18 12/05/2023   PROT 6.7 12/05/2023   ALBUMIN 4.3 12/05/2023   CALCIUM  9.3 12/05/2023   ANIONGAP 10 04/19/2022   EGFR 44.0 03/14/2024   Lab Results  Component Value Date   CHOL 136 11/02/2023   Lab Results  Component Value Date   HDL 45 11/02/2023   Lab Results  Component Value Date   LDLCALC 72 11/02/2023   Lab Results  Component Value Date   TRIG 101 11/02/2023   Lab Results  Component Value Date   CHOLHDL 3.0 11/02/2023   Lab Results  Component Value Date   HGBA1C 5.4 12/05/2023        Results for orders placed or performed in visit on 07/29/24  POC COVID-19 BinaxNow   Collection Time: 07/29/24  2:46 PM  Result Value Ref Range   SARS Coronavirus 2 Ag Negative Negative     Assessment & Plan:   Assessment & Plan Acute bronchitis due to other specified organisms ACUTE UPPER RESPIRATORY INFECTION: You have a recurrent cough and congestion without fever or wheezing. -Recommend nasal saline spray.  -You have been prescribed a different antibiotic to help with your symptoms. AUGMENTIN  SENT -For cough relief, you can use over-the-counter Corecidin.  -You were given a kenalog  shot in the office.   Orders:   triamcinolone  acetonide (KENALOG -40) injection 80 mg   amoxicillin -clavulanate (AUGMENTIN ) 875-125  MG tablet; Take 1 tablet by mouth 2 (two) times daily.     Body mass index is 32.86 kg/m..   Meds ordered this encounter  Medications   triamcinolone  acetonide (KENALOG -40) injection 80 mg   amoxicillin -clavulanate (AUGMENTIN ) 875-125 MG tablet    Sig: Take 1 tablet by mouth 2 (two) times daily.    Dispense:  20 tablet    Refill:  0    No orders of the defined types were placed in this encounter.    Follow-up: Return if symptoms worsen or fail to improve.  An After Visit Summary was printed and given to the patient.  Abigail Free, MD [facility] [phone]     [1]  Allergies Allergen Reactions   Pravastatin  Other (See Comments)    Other Reaction(s): Other (See Comments)  myalgias, myalgia  Other Reaction(s): Other (See Comments)    myalgias, myalgia    myalgias myalgia    myalgia    Other Reaction(s): Other (See Comments)  myalgias, myalgia    myalgias   Prednisone  Other (See Comments) and Anxiety    makes me feel terrible  Other Reaction(s): Angioedema, Other (See Comments)  Pt feels out of regular routine, makes me feel terrible  Other Reaction(s): Angioedema, Other (See Comments)    Pt feels out of regular routine, makes me feel terrible    Pt feels out of regular routine    Pt feels out of regular routine makes me feel terrible    makes me feel terrible  Other Reaction(s): Angioedema, Other (See Comments)  Pt feels out of regular routine, makes me feel terrible   Ozempic  (0.25 Or 0.5 Mg-Dose) [Semaglutide (0.25 Or 0.5mg -Dos)] Nausea Only    nausea   Diltiazem Hcl Nausea And Vomiting and Other (See Comments)   Ezetimibe Other (See Comments)    myalgias  Other Reaction(s): Other (See Comments)    myalgias   Kerendia  [Finerenone ]     Chest pain   Moxifloxacin Hcl Other (See Comments)    Unknown  Other Reaction(s): Other (See Comments)    Pt reports unknown reaction    Unknown   Pravachol  [Pravastatin  Sodium]  Other (See Comments)    myalgia

## 2024-08-14 NOTE — Patient Instructions (Signed)
°  VISIT SUMMARY: Today, you were seen for a persistent cough and nasal congestion that have been ongoing since Monday. You have been experiencing a tickle in the back of your throat that triggers coughing, especially at night, and a sensation of being 'stopped up in your head'.  YOUR PLAN: ACUTE UPPER RESPIRATORY INFECTION: You have a recurrent cough and congestion without fever or wheezing. There may also be nasal sores from the nasal spray you have been using. -Recommend nasal saline spray.  -You have been prescribed a different antibiotic to help with your symptoms. AUGMENTIN  SENT -For cough relief, you can use over-the-counter Corecidin.  -You were given a kenalog  shot in the office.                       Contains text generated by Abridge.                                 Contains text generated by Abridge.

## 2024-09-08 NOTE — Progress Notes (Signed)
 "  Subjective:  Patient ID: John Kemp, male    DOB: [birth year]  Age: 79 y.o. MRN: [id number]  Chief Complaint  Patient presents with   Medical Management of Chronic Issues    HPI: Discussed the use of AI scribe software for clinical note transcription with the patient, who gave verbal consent to proceed.  History of Present Illness John Kemp is a 79 year old male with carotid stenosis and mild heart failure who presents for a follow-up visit.  Carotid artery stenosis - Left-sided carotid artery narrowing of 50 to 69 percent. - Due in February to return to vascular surgery.  - Previous evaluation for peripheral artery disease or lower extremity arterial blockages.  Heart failure - Mild heart failure. - Currently taking Farxiga  for heart failure management.  Cardiopulmonary symptoms - No chest pain, palpitations, dizziness, or lightheadedness. - No fevers, chills, sweats, earaches, sore throat, or nasal congestion.  Respiratory history - History of bronchitis, resolved since last visit on December 17th.  Physical activity and nutrition - Exercises three times a week at the hospital [facility]. - Limited physical activity on other days. - Diet includes occasional apples and bananas; overall dietary habits are not healthy.       02/05/2024    8:21 AM 11/02/2023    8:48 AM 09/11/2023    2:02 PM 07/24/2023    8:33 AM 07/24/2023    8:03 AM  Depression screen PHQ 2/9  Decreased Interest 0 0 0 0 0  Down, Depressed, Hopeless 0 0 0 0 0  PHQ - 2 Score 0 0 0 0 0        02/05/2024    8:21 AM  Fall Risk   Falls in the past year? 0  Number falls in past yr: 0  Injury with Fall? 0   Risk for fall due to : No Fall Risks  Follow up Falls evaluation completed     Data saved with a previous flowsheet row definition    Patient Care Team: Sherre Clapper, MD as PCP - General (Family Medicine) Nyle Rankin POUR, [facility] (Inactive) as Pharmacist (Pharmacist) Jinx Grate, AUD (Audiology) Monetta Redell PARAS, MD as Consulting Physician (Cardiology)   Review of Systems  Constitutional:  Negative for chills, fatigue and fever.  HENT:  Negative for congestion, ear pain and sore throat.   Respiratory:  Negative for cough and shortness of breath.   Cardiovascular:  Negative for chest pain.  Gastrointestinal:  Negative for abdominal pain, constipation, diarrhea, nausea and vomiting.  Genitourinary:  Negative for dysuria and frequency.  Musculoskeletal:  Negative for arthralgias and myalgias.  Neurological:  Negative for dizziness and headaches.  Psychiatric/Behavioral:  Negative for dysphoric mood.        No dysphoria    Medications Ordered Prior to Encounter[1] Past Medical History:  Diagnosis Date   A-fib (HCC)    Acute bronchitis due to other specified organisms 10/28/2021   Anticoagulant long-term use 10/11/2022   Atherosclerotic heart disease of native coronary artery without angina pectoris    Atrial fibrillation with rapid ventricular response (HCC) 11/12/2021   Atrial flutter with rapid ventricular response (HCC) 11/12/2021   B12 deficiency 06/03/2022   Benign prostatic hyperplasia with urinary frequency 12/24/2021   Bilateral carotid artery stenosis 02/20/2022   CAD in native artery 01/21/2015   Cancer [facility])    skin cancer   Carotid artery occlusion    CHF (congestive heart failure) (HCC) 11/05/2019   Chronic diastolic (congestive) heart  failure (HCC)    Chronic obstructive pulmonary disease (COPD) (HCC)    CKD stage 3b, GFR 30-44 ml/min (HCC) 12/11/2023   Class 1 obesity due to excess calories with serious comorbidity and body mass index (BMI) of 32.0 to 32.9 in adult    Coronary artery disease involving autologous artery coronary bypass graft 11/12/2021   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 11/17/2019   Dysequilibrium 11/12/2021   Dyslipidemia 11/12/2021   Elevated brain natriuretic peptide (BNP) level 11/12/2021    Encounter for immunization 08/23/2022   Essential hypertension 01/21/2015   right superior quadrantanopsia   Hearing loss of right ear due to cerumen impaction 10/20/2021   High cholesterol 10/20/2017   Hx of CABG 03/02/2017   Hypertension    Hypertensive heart and kidney disease with chronic diastolic congestive heart failure and stage 3 chronic kidney disease, unspecified whether stage 3a or 3b CKD (HCC) 07/24/2023   Hypertensive heart disease with heart failure (HCC)    Hypothyroidism (acquired) 04/24/2018   Impaired fasting glucose    Left rotator cuff tear 11/12/2021   Mixed hyperlipidemia 11/17/2019   Myocardial infarction (HCC)    Nasal septal deviation 10/11/2022   Nasal turbinate hypertrophy 10/11/2022   Neck pain 02/20/2022   Neural foraminal stenosis of cervical spine 02/28/2022   Non-seasonal allergic rhinitis due to pollen 10/20/2021   On amiodarone  therapy 01/21/2015   PAF (paroxysmal atrial fibrillation) (HCC)    Pedal edema 07/05/2022   Persistent atrial fibrillation (HCC) 11/12/2021   Physical deconditioning 12/07/2021   Primary insomnia    Seasonal allergies 11/13/2023   Sensorineural hearing loss (SNHL) of right ear with unrestricted hearing of left ear 05/26/2022   Sequela of cardioembolic stroke 11/17/2019   Severe obesity with body mass index (BMI) of 36.0 to 36.9 with serious comorbidity (HCC)    Squamous cell carcinoma 11/05/2019   Subjective tinnitus of right ear 05/26/2022   Vitamin D  deficiency, unspecified    Past Surgical History:  Procedure Laterality Date   APPENDECTOMY  1956   CARDIAC CATHETERIZATION     CARDIOVERSION  2002   CHOLECYSTECTOMY  2006   CORONARY ARTERY BYPASS GRAFT  2001   HERNIA REPAIR     RADIOLOGY WITH ANESTHESIA Bilateral 01/18/2018   Procedure: MRI WITH ANESTHESIA LUMBAR SPINE WITH AND WITHOUT CONTRAST;  Surgeon: Radiologist, Medication, MD;  Location: [facility] OR;  Service: Radiology;  Laterality: Bilateral;   RADIOLOGY WITH  ANESTHESIA N/A 04/19/2022   Procedure: MRI WITH BRAIN WITH AND WITHOUT CONTRAST;  Surgeon: Radiologist, Medication, MD;  Location: [facility] OR;  Service: Radiology;  Laterality: N/A;   ROTATOR CUFF REPAIR Left    TONSILLECTOMY  1975    Family History  Problem Relation Age of Onset   Heart disease Father    Arrhythmia Father    Breast cancer Mother    Diabetes type II Sister    Diabetes Brother    Social History   Socioeconomic History   Marital status: Married    Spouse name: Devere   Number of children: 2   Years of education: Not on file   Highest education level: Not on file  Occupational History   Occupation: Retired  Tobacco Use   Smoking status: Former    Current packs/day: 0.00    Average packs/day: 1 pack/day for 15.0 years (15.0 ttl pk-yrs)    Types: Cigarettes    Start date: 08/30/1963    Quit date: 08/29/1978    Years since quitting: 46.0    Passive  exposure: Past   Smokeless tobacco: Never  Vaping Use   Vaping status: Never Used  Substance and Sexual Activity   Alcohol use: No    Alcohol/week: 0.0 standard drinks of alcohol   Drug use: No   Sexual activity: Not Currently  Other Topics Concern   Not on file  Social History Narrative   Not on file   Social Drivers of Health   Tobacco Use: Medium Risk (09/09/2024)   Patient History    Smoking Tobacco Use: Former    Smokeless Tobacco Use: Never    Passive Exposure: Past  Physicist, Medical Strain: Low Risk (09/11/2023)   Overall Financial Resource Strain (CARDIA)    Difficulty of Paying Living Expenses: Not very hard  Food Insecurity: No Food Insecurity (09/11/2023)   Hunger Vital Sign    Worried About Running Out of Food in the Last Year: Never true    Ran Out of Food in the Last Year: Never true  Transportation Needs: No Transportation Needs (09/11/2023)   PRAPARE - Administrator, Civil Service (Medical): No    Lack of Transportation (Non-Medical): No  Physical Activity: Sufficiently Active  (09/11/2023)   Exercise Vital Sign    Days of Exercise per Week: 4 days    Minutes of Exercise per Session: 50 min  Stress: No Stress Concern Present (09/11/2023)   [employer] of Occupational Health - Occupational Stress Questionnaire    Feeling of Stress : Not at all  Social Connections: Moderately Integrated (11/02/2023)   Social Connection and Isolation Panel    Frequency of Communication with Friends and Family: Three times a week    Frequency of Social Gatherings with Friends and Family: Three times a week    Attends Religious Services: More than 4 times per year    Active Member of Clubs or Organizations: No    Attends Banker Meetings: Never    Marital Status: Married  Depression (PHQ2-9): Low Risk (02/05/2024)   Depression (PHQ2-9)    PHQ-2 Score: 0  Alcohol Screen: Low Risk (09/11/2023)   Alcohol Screen    Last Alcohol Screening Score (AUDIT): 0  Housing: Low Risk (09/11/2023)   Housing Stability Vital Sign    Unable to Pay for Housing in the Last Year: No    Number of Times Moved in the Last Year: 0    Homeless in the Last Year: No  Utilities: Not At Risk (09/11/2023)   AHC Utilities    Threatened with loss of utilities: No  Health Literacy: Adequate Health Literacy (11/02/2023)   B1300 Health Literacy    Frequency of need for help with medical instructions: Never    Objective:  BP 138/70   Pulse (!) 54   Temp (!) 97.3 F (36.3 C)   Resp 16   Ht 5' 10 (1.778 m)   Wt 225 lb (102.1 kg)   SpO2 97%   BMI 32.28 kg/m      09/09/2024    8:49 AM 08/14/2024    9:57 AM 07/29/2024    1:51 PM  BP/Weight  Systolic BP 138 128 130  Diastolic BP 70 74 70  Wt. (Lbs) 225 229 228  BMI 32.28 kg/m2 32.86 kg/m2 32.71 kg/m2    Physical Exam Vitals reviewed.  Constitutional:      Appearance: Normal appearance. He is obese.  Neck:     Vascular: No carotid bruit.  Cardiovascular:     Rate and Rhythm: Normal rate. Rhythm irregular.  Heart sounds: Normal  heart sounds.  Pulmonary:     Effort: Pulmonary effort is normal.     Breath sounds: Normal breath sounds. No wheezing, rhonchi or rales.  Abdominal:     General: Bowel sounds are normal.     Palpations: Abdomen is soft.     Tenderness: There is no abdominal tenderness.  Neurological:     Mental Status: He is alert and oriented to person, place, and time.  Psychiatric:        Mood and Affect: Mood normal.        Behavior: Behavior normal.         Lab Results  Component Value Date   WBC 7.3 09/09/2024   HGB 14.6 09/09/2024   HCT 44.8 09/09/2024   PLT 199 09/09/2024   GLUCOSE 97 09/09/2024   CHOL 138 09/09/2024   TRIG 79 09/09/2024   HDL 56 09/09/2024   LDLCALC 67 09/09/2024   ALT 18 09/09/2024   AST 20 09/09/2024   NA 140 09/09/2024   K 4.7 09/09/2024   CL 99 09/09/2024   CREATININE 1.48 (H) 09/09/2024   BUN 20 09/09/2024   CO2 26 09/09/2024   TSH 3.190 09/09/2024   INR 1.1 02/02/2022   HGBA1C 5.4 12/05/2023    Results for orders placed or performed in visit on 09/09/24  Comprehensive metabolic panel with GFR   Collection Time: 09/09/24 10:29 AM  Result Value Ref Range   Glucose 97 70 - 99 mg/dL   BUN 20 8 - 27 mg/dL   Creatinine, Ser 8.51 (H) 0.76 - 1.27 mg/dL   eGFR 49 (L) >40 fO/fpw/8.26   BUN/Creatinine Ratio 14 10 - 24   Sodium 140 134 - 144 mmol/L   Potassium 4.7 3.5 - 5.2 mmol/L   Chloride 99 96 - 106 mmol/L   CO2 26 20 - 29 mmol/L   Calcium  9.5 8.6 - 10.2 mg/dL   Total Protein 7.1 6.0 - 8.5 g/dL   Albumin 4.6 3.8 - 4.8 g/dL   Globulin, Total 2.5 1.5 - 4.5 g/dL   Bilirubin Total 0.9 0.0 - 1.2 mg/dL   Alkaline Phosphatase 108 47 - 123 IU/L   AST 20 0 - 40 IU/L   ALT 18 0 - 44 IU/L  CBC with Differential/Platelet   Collection Time: 09/09/24 10:29 AM  Result Value Ref Range   WBC 7.3 3.4 - 10.8 x10E3/uL   RBC 4.66 4.14 - 5.80 x10E6/uL   Hemoglobin 14.6 13.0 - 17.7 g/dL   Hematocrit 55.1 62.4 - 51.0 %   MCV 96 79 - 97 fL   MCH 31.3 26.6 - 33.0  pg   MCHC 32.6 31.5 - 35.7 g/dL   RDW 87.3 88.3 - 84.5 %   Platelets 199 150 - 450 x10E3/uL   Neutrophils 64 Not Estab. %   Lymphs 24 Not Estab. %   Monocytes 9 Not Estab. %   Eos 2 Not Estab. %   Basos 1 Not Estab. %   Neutrophils Absolute 4.8 1.4 - 7.0 x10E3/uL   Lymphocytes Absolute 1.8 0.7 - 3.1 x10E3/uL   Monocytes Absolute 0.6 0.1 - 0.9 x10E3/uL   EOS (ABSOLUTE) 0.1 0.0 - 0.4 x10E3/uL   Basophils Absolute 0.1 0.0 - 0.2 x10E3/uL   Immature Granulocytes 0 Not Estab. %   Immature Grans (Abs) 0.0 0.0 - 0.1 x10E3/uL  TSH   Collection Time: 09/09/24 10:29 AM  Result Value Ref Range   TSH 3.190 0.450 - 4.500 uIU/mL  Lipid  panel   Collection Time: 09/09/24 10:37 AM  Result Value Ref Range   Cholesterol, Total 138 100 - 199 mg/dL   Triglycerides 79 0 - 149 mg/dL   HDL 56 >60 mg/dL   VLDL Cholesterol Cal 15 5 - 40 mg/dL   LDL Chol Calc (NIH) 67 0 - 99 mg/dL   Chol/HDL Ratio 2.5 0.0 - 5.0 ratio  .  Assessment & Plan:   Assessment & Plan Hypertensive heart and kidney disease with chronic diastolic congestive heart failure and stage 3 chronic kidney disease, unspecified whether stage 3a or 3b CKD (HCC) Hypertension, CONGESTIVE HEART FAILURE well controlled. Kidney disease is stable.  No changes to medicines. Continue Amlodipine , Valsartan , and torsemide  20 mg daily.  - Continue farxiga  5 mg daily. Patient is concerned about cost. He will let me know if it is not feasible. I am concerned it is due to his deductible. We also discussed calling his insurance agent.  Continue to work on eating a healthy diet and exercise.      Hypothyroidism (acquired) Well controlled Continue Synthroid  at current dose of 112 mc once daily in am.  Orders:   TSH   Persistent atrial fibrillation (HCC) Rate controlled.  Management per specialist. Taking Eliquis  5 mg twice a day, Amiodarone  200 mg daily.      Mixed hyperlipidemia Well controlled.  No changes to medicines. Continue atorvastatin   40 mg before bed.  Continue to work on eating a healthy diet Encouraged exercise.   Orders:   Comprehensive metabolic panel with GFR   CBC with Differential/Platelet   Lipid panel   Class 1 obesity due to excess calories with serious comorbidity and body mass index (BMI) of 32.0 to 32.9 in adult Recommend continue to work on eating healthy diet and exercise.     Bilateral carotid artery stenosis Call vascular surgery to schedule follow up.     CAD in native artery Coronary artery disease is present.  The current medical regimen is effective;  continue present plan and medications.     History of malignant melanoma Management per specialist.      CKD stage 3b, GFR 30-44 ml/min (HCC) - Recommend continue farxiga .  - Intolerant to kerendia .       Body mass index is 32.28 kg/m.     No orders of the defined types were placed in this encounter.   Orders Placed This Encounter  Procedures   Comprehensive metabolic panel with GFR   CBC with Differential/Platelet   TSH   Lipid panel       Follow-up: Return in about 6 months (around 03/09/2025) for chronic follow up.  An After Visit Summary was printed and given to the patient.  Abigail Free, MD [facility] [phone]     [1]  Current Outpatient Medications on File Prior to Visit  Medication Sig Dispense Refill   acetaminophen  (TYLENOL ) 500 MG tablet Take 500 mg by mouth 2 (two) times daily.     amiodarone  (PACERONE ) 200 MG tablet TAKE 1 TABLET BY MOUTH EVERY DAY 90 tablet 3   amLODipine  (NORVASC ) 5 MG tablet Take 1 tablet (5 mg total) by mouth every Monday, Wednesday, and Friday. 45 tablet 3   atorvastatin  (LIPITOR) 40 MG tablet TAKE 1 TABLET BY MOUTH EVERY DAY 90 tablet 0   azelastine  (ASTELIN ) 0.1 % nasal spray Place 1 spray into both nostrils 2 (two) times daily. Use in each nostril as directed 30 mL 12   cyanocobalamin  (VITAMIN B12) 1000  MCG/ML injection INJECT 1 ML (1,000 MCG)  INTRAMUSCULARLY EVERY 30 DAYS 3 mL 1   ELIQUIS  5 MG TABS tablet TAKE 1 TABLET BY MOUTH TWICE A DAY 60 tablet 5   finasteride  (PROSCAR ) 5 MG tablet Take 1 tablet (5 mg total) by mouth daily. 90 tablet 1   ketoconazole (NIZORAL) 2 % shampoo Apply 1 Application topically 3 (three) times a week.     levothyroxine  (SYNTHROID ) 112 MCG tablet TAKE 1 TABLET BY MOUTH EVERY DAY 90 tablet 3   montelukast  (SINGULAIR ) 10 MG tablet TAKE 1 TABLET BY MOUTH EVERYDAY AT BEDTIME 90 tablet 1   nitroGLYCERIN  (NITROSTAT ) 0.4 MG SL tablet Place 1 tablet (0.4 mg total) under the tongue every 5 (five) minutes as needed for chest pain. 25 tablet 2   nystatin cream (MYCOSTATIN) Apply 1 Application topically 2 (two) times daily.     SYRINGE-NEEDLE, DISP, 3 ML (BD ECLIPSE SYRINGE/NEEDLE) 25G X 5/8 3 ML MISC Inject 1 ml of B12 every week for 4 weeks 50 each 0   tamsulosin  (FLOMAX ) 0.4 MG CAPS capsule TAKE 1 CAPSULE BY MOUTH EVERY DAY 90 capsule 1   torsemide  (DEMADEX ) 20 MG tablet TAKE 1 TABLET BY MOUTH EVERY DAY 90 tablet 1   triamcinolone  cream (KENALOG ) 0.1 % Apply 1 Application topically 2 (two) times daily.     valsartan  (DIOVAN ) 320 MG tablet TAKE 1 TABLET BY MOUTH EVERY DAY 90 tablet 1   Vitamin D , Ergocalciferol , (DRISDOL ) 1.25 MG (50000 UNIT) CAPS capsule TAKE 1 CAPSULE (50,000 UNITS TOTAL) BY MOUTH EVERY 7 (SEVEN) DAYS 12 capsule 2   zolpidem  (AMBIEN ) 10 MG tablet TAKE ONE TABLET BY MOUTH AT BEDTIME 90 tablet 1   dapagliflozin  propanediol (FARXIGA ) 5 MG TABS tablet Take 1 tablet (5 mg total) by mouth daily. (Patient not taking: Reported on 09/09/2024) 90 tablet 1   No current facility-administered medications on file prior to visit.   "

## 2024-09-08 NOTE — Assessment & Plan Note (Deleted)
 [name]

## 2024-09-08 NOTE — Assessment & Plan Note (Addendum)
 Recommend continue to work on eating healthy diet and exercise.

## 2024-09-08 NOTE — Assessment & Plan Note (Addendum)
 Well controlled.  No changes to medicines. Continue atorvastatin  40 mg before bed.  Continue to work on eating a healthy diet Encouraged exercise.   Orders:   Comprehensive metabolic panel with GFR   CBC with Differential/Platelet   Lipid panel

## 2024-09-08 NOTE — Assessment & Plan Note (Addendum)
 Rate controlled.  Management per specialist. Taking Eliquis  5 mg twice a day, Amiodarone  200 mg daily.

## 2024-09-08 NOTE — Assessment & Plan Note (Addendum)
 Well controlled Continue Synthroid  at current dose of 112 mc once daily in am.  Orders:   TSH

## 2024-09-09 ENCOUNTER — Ambulatory Visit: Admitting: Family Medicine

## 2024-09-09 ENCOUNTER — Encounter: Payer: Self-pay | Admitting: Family Medicine

## 2024-09-09 VITALS — BP 138/70 | HR 54 | Temp 97.3°F | Resp 16 | Ht 70.0 in | Wt 225.0 lb

## 2024-09-09 DIAGNOSIS — I4819 Other persistent atrial fibrillation: Secondary | ICD-10-CM | POA: Diagnosis not present

## 2024-09-09 DIAGNOSIS — N1832 Chronic kidney disease, stage 3b: Secondary | ICD-10-CM

## 2024-09-09 DIAGNOSIS — E039 Hypothyroidism, unspecified: Secondary | ICD-10-CM | POA: Diagnosis not present

## 2024-09-09 DIAGNOSIS — I2510 Atherosclerotic heart disease of native coronary artery without angina pectoris: Secondary | ICD-10-CM

## 2024-09-09 DIAGNOSIS — E6609 Other obesity due to excess calories: Secondary | ICD-10-CM | POA: Diagnosis not present

## 2024-09-09 DIAGNOSIS — E66811 Obesity, class 1: Secondary | ICD-10-CM

## 2024-09-09 DIAGNOSIS — I110 Hypertensive heart disease with heart failure: Secondary | ICD-10-CM

## 2024-09-09 DIAGNOSIS — N183 Chronic kidney disease, stage 3 unspecified: Secondary | ICD-10-CM

## 2024-09-09 DIAGNOSIS — I6523 Occlusion and stenosis of bilateral carotid arteries: Secondary | ICD-10-CM

## 2024-09-09 DIAGNOSIS — E782 Mixed hyperlipidemia: Secondary | ICD-10-CM | POA: Diagnosis not present

## 2024-09-09 DIAGNOSIS — I130 Hypertensive heart and chronic kidney disease with heart failure and stage 1 through stage 4 chronic kidney disease, or unspecified chronic kidney disease: Secondary | ICD-10-CM

## 2024-09-09 DIAGNOSIS — I5032 Chronic diastolic (congestive) heart failure: Secondary | ICD-10-CM

## 2024-09-09 DIAGNOSIS — I483 Typical atrial flutter: Secondary | ICD-10-CM

## 2024-09-09 DIAGNOSIS — Z85820 Personal history of malignant melanoma of skin: Secondary | ICD-10-CM | POA: Diagnosis not present

## 2024-09-09 LAB — LIPID PANEL
Chol/HDL Ratio: 2.5 ratio (ref 0.0–5.0)
Cholesterol, Total: 138 mg/dL (ref 100–199)
HDL: 56 mg/dL
LDL Chol Calc (NIH): 67 mg/dL (ref 0–99)
Triglycerides: 79 mg/dL (ref 0–149)
VLDL Cholesterol Cal: 15 mg/dL (ref 5–40)

## 2024-09-09 NOTE — Patient Instructions (Signed)
" °  VISIT SUMMARY: During your follow-up visit, we discussed your carotid artery stenosis, mild heart failure, and overall health. You are currently managing your conditions with medications and lifestyle changes.  YOUR PLAN: HYPERTENSIVE HEART DISEASE WITH HEART FAILURE/ATRIAL FIBRILLATION:  -Continue taking Farxiga  and Eliquis . -Discuss payment plan with your insurance if medications remain expensive after deductible is met.  CAROTID ARTERY STENOSIS: You have left-sided carotid artery narrowing of 50-69%. -Continue taking your statin and blood thinner. -Follow up with vascular surgery  for a repeat ultrasound.  MIXED HYPERLIPIDEMIA: You have mixed hyperlipidemia and are currently on statin therapy. -Continue taking your statin therapy.  OBESITY, CLASS 1: Your weight loss has stalled, and you are encouraged to increase physical activity and improve your diet. -Increase physical activity beyond your current three times a week. -Make dietary modifications, including increasing your fruit and vegetable intake.                      Contains text generated by [employer]bridge.                                 Contains text generated by Abridge.   "

## 2024-09-10 ENCOUNTER — Ambulatory Visit: Payer: Self-pay | Admitting: Family Medicine

## 2024-09-10 LAB — CBC WITH DIFFERENTIAL/PLATELET
Basophils Absolute: 0.1 x10E3/uL (ref 0.0–0.2)
Basos: 1 %
EOS (ABSOLUTE): 0.1 x10E3/uL (ref 0.0–0.4)
Eos: 2 %
Hematocrit: 44.8 % (ref 37.5–51.0)
Hemoglobin: 14.6 g/dL (ref 13.0–17.7)
Immature Grans (Abs): 0 x10E3/uL (ref 0.0–0.1)
Immature Granulocytes: 0 %
Lymphocytes Absolute: 1.8 x10E3/uL (ref 0.7–3.1)
Lymphs: 24 %
MCH: 31.3 pg (ref 26.6–33.0)
MCHC: 32.6 g/dL (ref 31.5–35.7)
MCV: 96 fL (ref 79–97)
Monocytes Absolute: 0.6 x10E3/uL (ref 0.1–0.9)
Monocytes: 9 %
Neutrophils Absolute: 4.8 x10E3/uL (ref 1.4–7.0)
Neutrophils: 64 %
Platelets: 199 x10E3/uL (ref 150–450)
RBC: 4.66 x10E6/uL (ref 4.14–5.80)
RDW: 12.6 % (ref 11.6–15.4)
WBC: 7.3 x10E3/uL (ref 3.4–10.8)

## 2024-09-10 LAB — COMPREHENSIVE METABOLIC PANEL WITH GFR
ALT: 18 IU/L (ref 0–44)
AST: 20 IU/L (ref 0–40)
Albumin: 4.6 g/dL (ref 3.8–4.8)
Alkaline Phosphatase: 108 IU/L (ref 47–123)
BUN/Creatinine Ratio: 14 (ref 10–24)
BUN: 20 mg/dL (ref 8–27)
Bilirubin Total: 0.9 mg/dL (ref 0.0–1.2)
CO2: 26 mmol/L (ref 20–29)
Calcium: 9.5 mg/dL (ref 8.6–10.2)
Chloride: 99 mmol/L (ref 96–106)
Creatinine, Ser: 1.48 mg/dL — ABNORMAL HIGH (ref 0.76–1.27)
Globulin, Total: 2.5 g/dL (ref 1.5–4.5)
Glucose: 97 mg/dL (ref 70–99)
Potassium: 4.7 mmol/L (ref 3.5–5.2)
Sodium: 140 mmol/L (ref 134–144)
Total Protein: 7.1 g/dL (ref 6.0–8.5)
eGFR: 49 mL/min/1.73 — ABNORMAL LOW

## 2024-09-10 LAB — TSH: TSH: 3.19 u[IU]/mL (ref 0.450–4.500)

## 2024-09-15 DIAGNOSIS — Z85820 Personal history of malignant melanoma of skin: Secondary | ICD-10-CM | POA: Insufficient documentation

## 2024-09-15 NOTE — Assessment & Plan Note (Signed)
 Call vascular surgery to schedule follow up

## 2024-09-15 NOTE — Assessment & Plan Note (Signed)
 Hypertension, CONGESTIVE HEART FAILURE well controlled. Kidney disease is stable.  No changes to medicines. Continue Amlodipine , Valsartan , and torsemide  20 mg daily.  - Continue farxiga  5 mg daily. Patient is concerned about cost. He will let me know if it is not feasible. I am concerned it is due to his deductible. We also discussed calling his insurance agent.  Continue to work on eating a healthy diet and exercise.

## 2024-09-15 NOTE — Assessment & Plan Note (Signed)
 Coronary artery disease is present.  The current medical regimen is effective;  continue present plan and medications.

## 2024-09-15 NOTE — Assessment & Plan Note (Signed)
-   Recommend continue farxiga .  - Intolerant to kerendia .

## 2024-09-15 NOTE — Assessment & Plan Note (Signed)
 Management per specialist.

## 2024-10-08 ENCOUNTER — Ambulatory Visit

## 2024-10-22 ENCOUNTER — Ambulatory Visit: Admitting: Cardiology

## 2024-11-18 ENCOUNTER — Ambulatory Visit

## 2024-11-18 ENCOUNTER — Ambulatory Visit (HOSPITAL_COMMUNITY)

## 2025-03-10 ENCOUNTER — Ambulatory Visit: Admitting: Family Medicine
